# Patient Record
Sex: Female | Born: 1940 | Race: White | Hispanic: No | Marital: Married | Smoking: Never smoker
Health system: Southern US, Community
[De-identification: ages and names within clinical notes are randomized; demographics above are authoritative.]

## PROBLEM LIST (undated history)

## (undated) DIAGNOSIS — M797 Fibromyalgia: Secondary | ICD-10-CM

## (undated) DIAGNOSIS — G709 Myoneural disorder, unspecified: Secondary | ICD-10-CM

## (undated) DIAGNOSIS — R112 Nausea with vomiting, unspecified: Secondary | ICD-10-CM

## (undated) DIAGNOSIS — Z9889 Other specified postprocedural states: Secondary | ICD-10-CM

## (undated) DIAGNOSIS — K219 Gastro-esophageal reflux disease without esophagitis: Secondary | ICD-10-CM

## (undated) DIAGNOSIS — E559 Vitamin D deficiency, unspecified: Secondary | ICD-10-CM

## (undated) DIAGNOSIS — IMO0002 Reserved for concepts with insufficient information to code with codable children: Secondary | ICD-10-CM

## (undated) DIAGNOSIS — M1990 Unspecified osteoarthritis, unspecified site: Secondary | ICD-10-CM

## (undated) DIAGNOSIS — M069 Rheumatoid arthritis, unspecified: Secondary | ICD-10-CM

## (undated) DIAGNOSIS — R600 Localized edema: Secondary | ICD-10-CM

## (undated) DIAGNOSIS — Z9289 Personal history of other medical treatment: Secondary | ICD-10-CM

## (undated) DIAGNOSIS — G35 Multiple sclerosis: Secondary | ICD-10-CM

## (undated) DIAGNOSIS — D649 Anemia, unspecified: Secondary | ICD-10-CM

## (undated) DIAGNOSIS — I10 Essential (primary) hypertension: Secondary | ICD-10-CM

## (undated) DIAGNOSIS — E785 Hyperlipidemia, unspecified: Secondary | ICD-10-CM

## (undated) HISTORY — PX: OTHER SURGICAL HISTORY: SHX169

## (undated) HISTORY — PX: JOINT REPLACEMENT: SHX530

## (undated) HISTORY — PX: DILATION AND CURETTAGE OF UTERUS: SHX78

## (undated) HISTORY — PX: COLONOSCOPY: SHX174

## (undated) HISTORY — PX: THYROID LOBECTOMY: SHX420

## (undated) HISTORY — PX: BACK SURGERY: SHX140

## (undated) HISTORY — PX: WRIST SURGERY: SHX841

## (undated) HISTORY — PX: EYE SURGERY: SHX253

## (undated) HISTORY — PX: AUGMENTATION MAMMAPLASTY: SUR837

## (undated) HISTORY — DX: Rheumatoid arthritis, unspecified: M06.9

## (undated) HISTORY — DX: Multiple sclerosis: G35

## (undated) HISTORY — PX: KNEE ARTHROSCOPY: SUR90

## (undated) HISTORY — PX: BREAST SURGERY: SHX581

---

## 1997-10-17 ENCOUNTER — Other Ambulatory Visit: Admission: RE | Admit: 1997-10-17 | Discharge: 1997-10-17 | Payer: Self-pay | Admitting: Gynecology

## 1998-03-30 ENCOUNTER — Encounter: Payer: Self-pay | Admitting: Gynecology

## 1998-03-30 ENCOUNTER — Ambulatory Visit (HOSPITAL_COMMUNITY): Admission: RE | Admit: 1998-03-30 | Discharge: 1998-03-30 | Payer: Self-pay | Admitting: Gynecology

## 1998-04-13 ENCOUNTER — Ambulatory Visit (HOSPITAL_COMMUNITY): Admission: RE | Admit: 1998-04-13 | Discharge: 1998-04-13 | Payer: Self-pay | Admitting: General Surgery

## 1998-10-22 ENCOUNTER — Other Ambulatory Visit: Admission: RE | Admit: 1998-10-22 | Discharge: 1998-10-22 | Payer: Self-pay | Admitting: Gynecology

## 1999-05-01 ENCOUNTER — Encounter: Payer: Self-pay | Admitting: Gynecology

## 1999-05-01 ENCOUNTER — Ambulatory Visit (HOSPITAL_COMMUNITY): Admission: RE | Admit: 1999-05-01 | Discharge: 1999-05-01 | Payer: Self-pay | Admitting: Gynecology

## 1999-11-12 ENCOUNTER — Other Ambulatory Visit: Admission: RE | Admit: 1999-11-12 | Discharge: 1999-11-12 | Payer: Self-pay | Admitting: Gynecology

## 2000-05-06 ENCOUNTER — Encounter: Payer: Self-pay | Admitting: Gynecology

## 2000-05-06 ENCOUNTER — Ambulatory Visit (HOSPITAL_COMMUNITY): Admission: RE | Admit: 2000-05-06 | Discharge: 2000-05-06 | Payer: Self-pay | Admitting: Gynecology

## 2000-12-28 ENCOUNTER — Other Ambulatory Visit: Admission: RE | Admit: 2000-12-28 | Discharge: 2000-12-28 | Payer: Self-pay | Admitting: Gynecology

## 2001-05-26 ENCOUNTER — Encounter: Payer: Self-pay | Admitting: Gynecology

## 2001-05-26 ENCOUNTER — Ambulatory Visit (HOSPITAL_COMMUNITY): Admission: RE | Admit: 2001-05-26 | Discharge: 2001-05-26 | Payer: Self-pay | Admitting: Gynecology

## 2002-01-03 ENCOUNTER — Other Ambulatory Visit: Admission: RE | Admit: 2002-01-03 | Discharge: 2002-01-03 | Payer: Self-pay | Admitting: Gynecology

## 2002-06-01 ENCOUNTER — Ambulatory Visit (HOSPITAL_COMMUNITY): Admission: RE | Admit: 2002-06-01 | Discharge: 2002-06-01 | Payer: Self-pay | Admitting: Gynecology

## 2002-06-01 ENCOUNTER — Encounter: Payer: Self-pay | Admitting: Gynecology

## 2003-01-16 ENCOUNTER — Other Ambulatory Visit: Admission: RE | Admit: 2003-01-16 | Discharge: 2003-01-16 | Payer: Self-pay | Admitting: Gynecology

## 2003-06-27 ENCOUNTER — Ambulatory Visit (HOSPITAL_COMMUNITY): Admission: RE | Admit: 2003-06-27 | Discharge: 2003-06-27 | Payer: Self-pay | Admitting: Gynecology

## 2004-05-15 ENCOUNTER — Other Ambulatory Visit: Admission: RE | Admit: 2004-05-15 | Discharge: 2004-05-15 | Payer: Self-pay | Admitting: Family Medicine

## 2004-06-27 ENCOUNTER — Ambulatory Visit (HOSPITAL_COMMUNITY): Admission: RE | Admit: 2004-06-27 | Discharge: 2004-06-27 | Payer: Self-pay | Admitting: Family Medicine

## 2005-07-02 ENCOUNTER — Ambulatory Visit (HOSPITAL_COMMUNITY): Admission: RE | Admit: 2005-07-02 | Discharge: 2005-07-02 | Payer: Self-pay | Admitting: Family Medicine

## 2005-07-10 ENCOUNTER — Other Ambulatory Visit: Admission: RE | Admit: 2005-07-10 | Discharge: 2005-07-10 | Payer: Self-pay | Admitting: Family Medicine

## 2006-07-14 ENCOUNTER — Ambulatory Visit (HOSPITAL_COMMUNITY): Admission: RE | Admit: 2006-07-14 | Discharge: 2006-07-14 | Payer: Self-pay | Admitting: Family Medicine

## 2006-07-23 ENCOUNTER — Other Ambulatory Visit: Admission: RE | Admit: 2006-07-23 | Discharge: 2006-07-23 | Payer: Self-pay | Admitting: Family Medicine

## 2007-08-02 ENCOUNTER — Ambulatory Visit (HOSPITAL_COMMUNITY): Admission: RE | Admit: 2007-08-02 | Discharge: 2007-08-02 | Payer: Self-pay | Admitting: Family Medicine

## 2008-08-28 ENCOUNTER — Ambulatory Visit (HOSPITAL_COMMUNITY): Admission: RE | Admit: 2008-08-28 | Discharge: 2008-08-28 | Payer: Self-pay | Admitting: Family Medicine

## 2009-10-26 ENCOUNTER — Ambulatory Visit (HOSPITAL_COMMUNITY): Admission: RE | Admit: 2009-10-26 | Discharge: 2009-10-26 | Payer: Self-pay | Admitting: Family Medicine

## 2010-10-21 ENCOUNTER — Other Ambulatory Visit (HOSPITAL_COMMUNITY): Payer: Self-pay | Admitting: Family Medicine

## 2010-10-21 DIAGNOSIS — Z1231 Encounter for screening mammogram for malignant neoplasm of breast: Secondary | ICD-10-CM

## 2010-10-30 ENCOUNTER — Ambulatory Visit (HOSPITAL_COMMUNITY)
Admission: RE | Admit: 2010-10-30 | Discharge: 2010-10-30 | Disposition: A | Discharge status: Home / Self Care | Payer: Medicare Other | Source: Ambulatory Visit | Attending: Family Medicine | Admitting: Family Medicine

## 2010-10-30 DIAGNOSIS — Z1231 Encounter for screening mammogram for malignant neoplasm of breast: Secondary | ICD-10-CM

## 2011-12-15 ENCOUNTER — Other Ambulatory Visit (HOSPITAL_COMMUNITY): Payer: Self-pay | Admitting: Family Medicine

## 2011-12-15 DIAGNOSIS — Z1231 Encounter for screening mammogram for malignant neoplasm of breast: Secondary | ICD-10-CM

## 2011-12-15 DIAGNOSIS — Z139 Encounter for screening, unspecified: Secondary | ICD-10-CM

## 2011-12-26 ENCOUNTER — Other Ambulatory Visit (HOSPITAL_COMMUNITY): Payer: Self-pay | Admitting: Family Medicine

## 2011-12-26 ENCOUNTER — Ambulatory Visit (HOSPITAL_COMMUNITY)
Admission: RE | Admit: 2011-12-26 | Discharge: 2011-12-26 | Disposition: A | Discharge status: Home / Self Care | Payer: Medicare Other | Source: Ambulatory Visit | Attending: Family Medicine | Admitting: Family Medicine

## 2011-12-26 DIAGNOSIS — Z1231 Encounter for screening mammogram for malignant neoplasm of breast: Secondary | ICD-10-CM

## 2012-06-02 ENCOUNTER — Encounter (HOSPITAL_COMMUNITY): Payer: Self-pay | Admitting: Pharmacy Technician

## 2012-06-08 ENCOUNTER — Encounter (HOSPITAL_COMMUNITY): Payer: Self-pay

## 2012-06-08 ENCOUNTER — Encounter (HOSPITAL_COMMUNITY)
Admission: RE | Admit: 2012-06-08 | Discharge: 2012-06-08 | Disposition: A | Discharge status: Home / Self Care | Payer: Medicare Other | Source: Ambulatory Visit | Attending: Orthopedic Surgery | Admitting: Orthopedic Surgery

## 2012-06-08 HISTORY — DX: Unspecified osteoarthritis, unspecified site: M19.90

## 2012-06-08 HISTORY — DX: Anemia, unspecified: D64.9

## 2012-06-08 HISTORY — DX: Personal history of other medical treatment: Z92.89

## 2012-06-08 HISTORY — DX: Other specified postprocedural states: Z98.890

## 2012-06-08 HISTORY — DX: Gastro-esophageal reflux disease without esophagitis: K21.9

## 2012-06-08 HISTORY — DX: Vitamin D deficiency, unspecified: E55.9

## 2012-06-08 HISTORY — DX: Nausea with vomiting, unspecified: R11.2

## 2012-06-08 HISTORY — DX: Reserved for concepts with insufficient information to code with codable children: IMO0002

## 2012-06-08 HISTORY — DX: Hyperlipidemia, unspecified: E78.5

## 2012-06-08 HISTORY — DX: Myoneural disorder, unspecified: G70.9

## 2012-06-08 LAB — APTT: aPTT: 37 seconds (ref 24–37)

## 2012-06-08 LAB — URINALYSIS, ROUTINE W REFLEX MICROSCOPIC
Glucose, UA: NEGATIVE mg/dL
Hgb urine dipstick: NEGATIVE
Ketones, ur: NEGATIVE mg/dL
Leukocytes, UA: NEGATIVE
Nitrite: NEGATIVE
Protein, ur: NEGATIVE mg/dL
Specific Gravity, Urine: 1.028 (ref 1.005–1.030)
Urobilinogen, UA: 0.2 mg/dL (ref 0.0–1.0)
pH: 5.5 (ref 5.0–8.0)

## 2012-06-08 LAB — PROTIME-INR
INR: 1.03 (ref 0.00–1.49)
Prothrombin Time: 13.4 seconds (ref 11.6–15.2)

## 2012-06-08 LAB — SURGICAL PCR SCREEN
MRSA, PCR: NEGATIVE
Staphylococcus aureus: NEGATIVE

## 2012-06-08 LAB — ABO/RH: ABO/RH(D): O POS

## 2012-06-08 NOTE — Progress Notes (Signed)
OV Dr Hyacinth Meeker with clearance 3/14 chart. CBC with diff, CMP, chest x ray, EKG 05/18/12 CHART

## 2012-06-08 NOTE — Patient Instructions (Addendum)
20 [facility] STAY  06/08/2012   Your procedure is scheduled on:  06/15/12 TUESDAY  Report to [facility] at  [phone]     AM.  Call this number if you have problems the morning of surgery: [phone]       Remember:   Do not eat food  Or drink :After Midnight.MONDAY NIGHT   Take these medicines the morning of surgery with A SIP OF WATER:  PROLISEC   .  Contacts, dentures or partial plates can not be worn to surgery  Leave suitcase in the car. After surgery it may be brought to your room.  For patients admitted to the hospital, checkout time is 11:00 AM day of  discharge.             SPECIAL INSTRUCTIONS- SEE [facility] PREPARING FOR SURGERY INSTRUCTION SHEET-     DO NOT WEAR JEWELRY, LOTIONS, POWDERS, OR PERFUMES.  WOMEN-- DO NOT SHAVE LEGS OR UNDERARMS FOR 12 HOURS BEFORE SHOWERS. MEN MAY SHAVE FACE.  Patients discharged the day of surgery will not be allowed to drive home. IF going home the day of surgery, you must have a driver and someone to stay with you for the first 24 hours  Name and phone number of your driver:    admission                                                                   Please read over the following fact sheets that you were given: MRSA Information, Incentive Spirometry Sheet, Blood Transfusion Sheet  Information                                                                                   Michele Bryan  PST [phone]  [phone]                 FAILURE TO FOLLOW THESE INSTRUCTIONS MAY RESULT IN  CANCELLATION   OF YOUR SURGERY                                                  Patient Signature _____________________________

## 2012-06-09 NOTE — H&P (Signed)
TOTAL KNEE ADMISSION H&P  Patient is being admitted for right total knee arthroplasty.  Subjective:  Chief Complaint:right knee OA / pain.  HPI: Michele Bryan, 72 y.o. female, has a history of pain and functional disability in the right knee due to arthritis and has failed non-surgical conservative treatments for greater than 12 weeks to includeNSAID's and/or analgesics, use of assistive devices and activity modification.  Onset of symptoms was gradual, starting 10 years ago with gradually worsening course since that time. The patient noted no past surgery on the right knee(s).  Patient currently rates pain in the right knee(s) at 10 out of 10 with activity. Patient has worsening of pain with activity and weight bearing, pain that interferes with activities of daily living, pain with passive range of motion, crepitus and joint swelling.  Patient has evidence of periarticular osteophytes and joint space narrowing by imaging studies.  There is no active infection.  Risks, benefits and expectations were discussed with the patient. Patient understand the risks, benefits and expectations and wishes to proceed with surgery.   D/C Plans:   Home with HHPT  Post-op Meds:    Rx given for ASA, Zanaflex, Iron, Colace and MiraLax. Celebrex to be prescribed, but no written Rx needed, already has at home.  Tranexamic Acid:     To be given  Decadron:    To be given  FYI:    Would like to use CPM after surgery    Past Medical History  Diagnosis Date  . Hyperlipidemia   . Arthritis   . Neuromuscular disorder     multiple scleroosis/peripheral neuropathy  . PONV (postoperative nausea and vomiting)   . GERD (gastroesophageal reflux disease)   . History of blood transfusion   . Ulcer   . Vitamin D deficiency   . Anemia     Past Surgical History  Procedure Laterality Date  . Thyroid lobectomy    . Breast surgery      biopsy  . Knee arthroscopy Right   . Breast augumentation      No prescriptions  prior to admission   Allergies  Allergen Reactions  . Codeine Nausea And Vomiting  . Demerol (Meperidine) Nausea And Vomiting    History  Substance Use Topics  . Smoking status: Never Smoker   . Smokeless tobacco: Never Used  . Alcohol Use: No      Review of Systems  Constitutional: Negative.   HENT: Negative.   Eyes: Negative.   Respiratory: Negative.   Cardiovascular: Negative.   Gastrointestinal: Negative.   Genitourinary: Positive for urgency and frequency.  Musculoskeletal: Positive for joint pain.  Skin: Negative.   Neurological: Negative.   Endo/Heme/Allergies: Negative.   Psychiatric/Behavioral: Negative.     Objective:  Physical Exam  Constitutional: She is oriented to person, place, and time. She appears well-developed and well-nourished.  HENT:  Head: Normocephalic and atraumatic.  Mouth/Throat: Oropharynx is clear and moist.  Eyes: Pupils are equal, round, and reactive to light.  Neck: Neck supple. No JVD present. No tracheal deviation present. No thyromegaly present.  Cardiovascular: Normal rate, regular rhythm, normal heart sounds and intact distal pulses.   Respiratory: Effort normal and breath sounds normal. No stridor. No respiratory distress. She has no wheezes.  GI: Soft. There is no tenderness. There is no guarding.  Musculoskeletal:       Right knee: She exhibits decreased range of motion, swelling and bony tenderness. She exhibits no effusion, no ecchymosis, no deformity, no laceration and no  erythema. Tenderness found.  Lymphadenopathy:    She has no cervical adenopathy.  Neurological: She is alert and oriented to person, place, and time.  Skin: Skin is warm and dry.  Psychiatric: She has a normal mood and affect.     Imaging Review Plain radiographs demonstrate severe degenerative joint disease of the right knee(s). The overall alignment isneutral. The bone quality appears to be good for age and reported activity  level.  Assessment/Plan:  End stage arthritis, right knee   The patient history, physical examination, clinical judgment of the provider and imaging studies are consistent with end stage degenerative joint disease of the right knee(s) and total knee arthroplasty is deemed medically necessary. The treatment options including medical management, injection therapy arthroscopy and arthroplasty were discussed at length. The risks and benefits of total knee arthroplasty were presented and reviewed. The risks due to aseptic loosening, infection, stiffness, patella tracking problems, thromboembolic complications and other imponderables were discussed. The patient acknowledged the explanation, agreed to proceed with the plan and consent was signed. Patient is being admitted for inpatient treatment for surgery, pain control, PT, OT, prophylactic antibiotics, VTE prophylaxis, progressive ambulation and ADL's and discharge planning. The patient is planning to be discharged home with home health services.    Anastasio Auerbach Ferrah Panagopoulos   PAC  06/09/2012, 5:09 PM

## 2012-06-15 ENCOUNTER — Encounter (HOSPITAL_COMMUNITY): Payer: Self-pay | Admitting: *Deleted

## 2012-06-15 ENCOUNTER — Inpatient Hospital Stay (HOSPITAL_COMMUNITY)
Admission: RE | Admit: 2012-06-15 | Discharge: 2012-06-16 | DRG: 470 | Disposition: A | Discharge status: Home Health | Payer: Medicare Other | Source: Ambulatory Visit | Attending: Orthopedic Surgery | Admitting: Orthopedic Surgery

## 2012-06-15 ENCOUNTER — Encounter (HOSPITAL_COMMUNITY): Payer: Self-pay | Admitting: Anesthesiology

## 2012-06-15 ENCOUNTER — Inpatient Hospital Stay (HOSPITAL_COMMUNITY): Payer: Medicare Other | Admitting: Anesthesiology

## 2012-06-15 ENCOUNTER — Encounter (HOSPITAL_COMMUNITY)
Admission: RE | Disposition: A | Discharge status: Home Health | Payer: Self-pay | Source: Ambulatory Visit | Attending: Orthopedic Surgery

## 2012-06-15 DIAGNOSIS — G35 Multiple sclerosis: Secondary | ICD-10-CM | POA: Diagnosis present

## 2012-06-15 DIAGNOSIS — Z96659 Presence of unspecified artificial knee joint: Secondary | ICD-10-CM

## 2012-06-15 DIAGNOSIS — Z01812 Encounter for preprocedural laboratory examination: Secondary | ICD-10-CM

## 2012-06-15 DIAGNOSIS — D500 Iron deficiency anemia secondary to blood loss (chronic): Secondary | ICD-10-CM

## 2012-06-15 DIAGNOSIS — G609 Hereditary and idiopathic neuropathy, unspecified: Secondary | ICD-10-CM | POA: Diagnosis present

## 2012-06-15 DIAGNOSIS — Z96651 Presence of right artificial knee joint: Secondary | ICD-10-CM

## 2012-06-15 DIAGNOSIS — E785 Hyperlipidemia, unspecified: Secondary | ICD-10-CM | POA: Diagnosis present

## 2012-06-15 DIAGNOSIS — M1710 Unilateral primary osteoarthritis, unspecified knee: Principal | ICD-10-CM | POA: Diagnosis present

## 2012-06-15 DIAGNOSIS — D62 Acute posthemorrhagic anemia: Secondary | ICD-10-CM | POA: Diagnosis not present

## 2012-06-15 DIAGNOSIS — K219 Gastro-esophageal reflux disease without esophagitis: Secondary | ICD-10-CM | POA: Diagnosis present

## 2012-06-15 HISTORY — PX: TOTAL KNEE ARTHROPLASTY: SHX125

## 2012-06-15 LAB — TYPE AND SCREEN
ABO/RH(D): O POS
Antibody Screen: NEGATIVE

## 2012-06-15 SURGERY — ARTHROPLASTY, KNEE, TOTAL
Anesthesia: Spinal | Site: Knee | Laterality: Right | Wound class: Clean

## 2012-06-15 MED ORDER — ACETAMINOPHEN 10 MG/ML IV SOLN: 1000.0000 mg | Freq: Once | INTRAVENOUS | Status: DC | PRN

## 2012-06-15 MED ORDER — SCOPOLAMINE 1 MG/3DAYS TD PT72: MEDICATED_PATCH | TRANSDERMAL | Status: DC | PRN

## 2012-06-15 MED ORDER — DEXAMETHASONE SODIUM PHOSPHATE 10 MG/ML IJ SOLN
10.0000 mg | Freq: Once | INTRAMUSCULAR | Status: AC
  Administered 2012-06-15: 10 mg via INTRAVENOUS

## 2012-06-15 MED ORDER — ALUM & MAG HYDROXIDE-SIMETH 200-200-20 MG/5ML PO SUSP: 30.0000 mL | ORAL | Status: DC | PRN

## 2012-06-15 MED ORDER — BUPIVACAINE LIPOSOME 1.3 % IJ SUSP
INTRAMUSCULAR | Status: DC | PRN
  Administered 2012-06-15: 20 mL

## 2012-06-15 MED ORDER — CEFAZOLIN SODIUM-DEXTROSE 2-3 GM-% IV SOLR
2.0000 g | INTRAVENOUS | Status: AC
  Administered 2012-06-15: 2 g via INTRAVENOUS

## 2012-06-15 MED ORDER — METOCLOPRAMIDE HCL 5 MG/ML IJ SOLN: 5.0000 mg | Freq: Three times a day (TID) | INTRAMUSCULAR | Status: DC | PRN

## 2012-06-15 MED ORDER — DOCUSATE SODIUM 100 MG PO CAPS
100.0000 mg | ORAL_CAPSULE | Freq: Two times a day (BID) | ORAL | Status: DC
  Administered 2012-06-15 – 2012-06-16 (×2): 100 mg via ORAL

## 2012-06-15 MED ORDER — PROPOFOL 10 MG/ML IV EMUL
INTRAVENOUS | Status: DC | PRN
  Administered 2012-06-15: 75 ug/kg/min via INTRAVENOUS

## 2012-06-15 MED ORDER — FERROUS SULFATE 325 (65 FE) MG PO TABS
325.0000 mg | ORAL_TABLET | Freq: Three times a day (TID) | ORAL | Status: DC
  Administered 2012-06-15 – 2012-06-16 (×3): 325 mg via ORAL
  Filled 2012-06-15 (×5): qty 1

## 2012-06-15 MED ORDER — ZOLPIDEM TARTRATE 5 MG PO TABS
5.0000 mg | ORAL_TABLET | Freq: Every evening | ORAL | Status: DC | PRN
  Administered 2012-06-15: 5 mg via ORAL
  Filled 2012-06-15: qty 1

## 2012-06-15 MED ORDER — ONDANSETRON HCL 4 MG/2ML IJ SOLN: 4.0000 mg | Freq: Four times a day (QID) | INTRAMUSCULAR | Status: DC | PRN

## 2012-06-15 MED ORDER — HYDROMORPHONE HCL PF 1 MG/ML IJ SOLN
0.5000 mg | INTRAMUSCULAR | Status: DC | PRN
  Administered 2012-06-16: 1 mg via INTRAVENOUS
  Filled 2012-06-15: qty 1

## 2012-06-15 MED ORDER — RIVAROXABAN 10 MG PO TABS
10.0000 mg | ORAL_TABLET | ORAL | Status: DC
  Administered 2012-06-16: 10 mg via ORAL
  Filled 2012-06-15 (×2): qty 1

## 2012-06-15 MED ORDER — METHOCARBAMOL 500 MG PO TABS
500.0000 mg | ORAL_TABLET | Freq: Four times a day (QID) | ORAL | Status: DC | PRN
  Administered 2012-06-15 – 2012-06-16 (×2): 500 mg via ORAL
  Filled 2012-06-15 (×2): qty 1

## 2012-06-15 MED ORDER — MIDAZOLAM HCL 5 MG/5ML IJ SOLN
INTRAMUSCULAR | Status: DC | PRN
  Administered 2012-06-15 (×2): 1 mg via INTRAVENOUS

## 2012-06-15 MED ORDER — EPHEDRINE SULFATE 50 MG/ML IJ SOLN
INTRAMUSCULAR | Status: DC | PRN
  Administered 2012-06-15 (×3): 5 mg via INTRAVENOUS

## 2012-06-15 MED ORDER — MEPERIDINE HCL 50 MG/ML IJ SOLN: 6.2500 mg | INTRAMUSCULAR | Status: DC | PRN

## 2012-06-15 MED ORDER — CELECOXIB 200 MG PO CAPS
200.0000 mg | ORAL_CAPSULE | Freq: Two times a day (BID) | ORAL | Status: DC
  Administered 2012-06-15 – 2012-06-16 (×2): 200 mg via ORAL
  Filled 2012-06-15 (×3): qty 1

## 2012-06-15 MED ORDER — SODIUM CHLORIDE 0.9 % IJ SOLN
INTRAMUSCULAR | Status: DC | PRN
  Administered 2012-06-15: 50 mL

## 2012-06-15 MED ORDER — LACTATED RINGERS IV SOLN
INTRAVENOUS | Status: DC
  Administered 2012-06-15 (×2): via INTRAVENOUS

## 2012-06-15 MED ORDER — PANTOPRAZOLE SODIUM 40 MG PO TBEC
40.0000 mg | DELAYED_RELEASE_TABLET | Freq: Every day | ORAL | Status: DC
  Administered 2012-06-16: 40 mg via ORAL
  Filled 2012-06-15: qty 1

## 2012-06-15 MED ORDER — ACETAMINOPHEN 10 MG/ML IV SOLN
INTRAVENOUS | Status: DC | PRN
  Administered 2012-06-15: 1000 mg via INTRAVENOUS

## 2012-06-15 MED ORDER — SCOPOLAMINE 1 MG/3DAYS TD PT72
MEDICATED_PATCH | TRANSDERMAL | Status: DC | PRN
  Administered 2012-06-15: 1.5 mg via TRANSDERMAL

## 2012-06-15 MED ORDER — L-LYSINE 500 MG PO TABS: 1.0000 | ORAL_TABLET | Freq: Two times a day (BID) | ORAL | Status: DC

## 2012-06-15 MED ORDER — FLEET ENEMA 7-19 GM/118ML RE ENEM: 1.0000 | ENEMA | Freq: Once | RECTAL | Status: AC | PRN

## 2012-06-15 MED ORDER — PHENOL 1.4 % MT LIQD: 1.0000 | OROMUCOSAL | Status: DC | PRN

## 2012-06-15 MED ORDER — PROPOFOL 10 MG/ML IV EMUL
INTRAVENOUS | Status: DC | PRN
  Administered 2012-06-15: 150 mg via INTRAVENOUS

## 2012-06-15 MED ORDER — METHOCARBAMOL 100 MG/ML IJ SOLN: 500.0000 mg | Freq: Four times a day (QID) | INTRAVENOUS | Status: DC | PRN

## 2012-06-15 MED ORDER — BISACODYL 10 MG RE SUPP: 10.0000 mg | Freq: Every day | RECTAL | Status: DC | PRN

## 2012-06-15 MED ORDER — PROMETHAZINE HCL 25 MG/ML IJ SOLN: 6.2500 mg | INTRAMUSCULAR | Status: DC | PRN

## 2012-06-15 MED ORDER — METOCLOPRAMIDE HCL 10 MG PO TABS: 5.0000 mg | ORAL_TABLET | Freq: Three times a day (TID) | ORAL | Status: DC | PRN

## 2012-06-15 MED ORDER — CEFAZOLIN SODIUM-DEXTROSE 2-3 GM-% IV SOLR
2.0000 g | Freq: Four times a day (QID) | INTRAVENOUS | Status: AC
  Administered 2012-06-15 (×2): 2 g via INTRAVENOUS
  Filled 2012-06-15 (×2): qty 50

## 2012-06-15 MED ORDER — DEXAMETHASONE SODIUM PHOSPHATE 10 MG/ML IJ SOLN: 10.0000 mg | Freq: Once | INTRAMUSCULAR | Status: DC

## 2012-06-15 MED ORDER — HYDROCODONE-ACETAMINOPHEN 7.5-325 MG PO TABS
1.0000 | ORAL_TABLET | ORAL | Status: DC
  Administered 2012-06-15 – 2012-06-16 (×5): 1 via ORAL
  Filled 2012-06-15 (×5): qty 1

## 2012-06-15 MED ORDER — DIPHENHYDRAMINE HCL 25 MG PO CAPS: 25.0000 mg | ORAL_CAPSULE | Freq: Four times a day (QID) | ORAL | Status: DC | PRN

## 2012-06-15 MED ORDER — BUPIVACAINE LIPOSOME 1.3 % IJ SUSP
20.0000 mL | Freq: Once | INTRAMUSCULAR | Status: DC
  Filled 2012-06-15: qty 20

## 2012-06-15 MED ORDER — ONDANSETRON HCL 4 MG PO TABS: 4.0000 mg | ORAL_TABLET | Freq: Four times a day (QID) | ORAL | Status: DC | PRN

## 2012-06-15 MED ORDER — HYDROMORPHONE HCL PF 1 MG/ML IJ SOLN
0.2500 mg | INTRAMUSCULAR | Status: DC | PRN
  Administered 2012-06-15: 0.5 mg via INTRAVENOUS

## 2012-06-15 MED ORDER — MENTHOL 3 MG MT LOZG: 1.0000 | LOZENGE | OROMUCOSAL | Status: DC | PRN

## 2012-06-15 MED ORDER — SODIUM CHLORIDE 0.9 % IV SOLN
INTRAVENOUS | Status: DC
  Administered 2012-06-15 – 2012-06-16 (×2): via INTRAVENOUS
  Filled 2012-06-15 (×5): qty 1000

## 2012-06-15 MED ORDER — TRANEXAMIC ACID 100 MG/ML IV SOLN
1000.0000 mg | Freq: Once | INTRAVENOUS | Status: AC
  Administered 2012-06-15: 1000 mg via INTRAVENOUS
  Filled 2012-06-15: qty 10

## 2012-06-15 MED ORDER — ONDANSETRON HCL 4 MG/2ML IJ SOLN
INTRAMUSCULAR | Status: DC | PRN
  Administered 2012-06-15: 4 mg via INTRAVENOUS

## 2012-06-15 MED ORDER — STERILE WATER FOR IRRIGATION IR SOLN
Status: DC | PRN
  Administered 2012-06-15 (×2): 1500 mL

## 2012-06-15 MED ORDER — FENTANYL CITRATE 0.05 MG/ML IJ SOLN
INTRAMUSCULAR | Status: DC | PRN
  Administered 2012-06-15 (×2): 50 ug via INTRAVENOUS

## 2012-06-15 MED ORDER — POLYETHYLENE GLYCOL 3350 17 G PO PACK
17.0000 g | PACK | Freq: Two times a day (BID) | ORAL | Status: DC
  Administered 2012-06-15 (×2): 17 g via ORAL

## 2012-06-15 SURGICAL SUPPLY — 60 items
ADH SKN CLS APL DERMABOND .7 (GAUZE/BANDAGES/DRESSINGS) ×1
BAG SPEC THK2 15X12 ZIP CLS (MISCELLANEOUS) ×1
BAG ZIPLOCK 12X15 (MISCELLANEOUS) ×2 IMPLANT
BANDAGE ELASTIC 6 VELCRO ST LF (GAUZE/BANDAGES/DRESSINGS) ×2 IMPLANT
BANDAGE ESMARK 6X9 LF (GAUZE/BANDAGES/DRESSINGS) ×1 IMPLANT
BLADE SAW SGTL 13.0X1.19X90.0M (BLADE) ×2 IMPLANT
BNDG CMPR 9X6 STRL LF SNTH (GAUZE/BANDAGES/DRESSINGS) ×1
BNDG ESMARK 6X9 LF (GAUZE/BANDAGES/DRESSINGS) ×2
BOWL SMART MIX CTS (DISPOSABLE) ×2 IMPLANT
CEMENT HV SMART SET (Cement) ×2 IMPLANT
CLOTH BEACON ORANGE TIMEOUT ST (SAFETY) ×2 IMPLANT
CUFF TOURN SGL QUICK 34 (TOURNIQUET CUFF) ×2
CUFF TRNQT CYL 34X4X40X1 (TOURNIQUET CUFF) ×1 IMPLANT
DECANTER SPIKE VIAL GLASS SM (MISCELLANEOUS) ×2 IMPLANT
DERMABOND ADVANCED (GAUZE/BANDAGES/DRESSINGS) ×1
DERMABOND ADVANCED .7 DNX12 (GAUZE/BANDAGES/DRESSINGS) ×1 IMPLANT
DRAPE EXTREMITY T 121X128X90 (DRAPE) ×2 IMPLANT
DRAPE POUCH INSTRU U-SHP 10X18 (DRAPES) ×1 IMPLANT
DRAPE U-SHAPE 47X51 STRL (DRAPES) ×2 IMPLANT
DRSG AQUACEL AG ADV 3.5X10 (GAUZE/BANDAGES/DRESSINGS) ×2 IMPLANT
DRSG TEGADERM 4X4.75 (GAUZE/BANDAGES/DRESSINGS) ×2 IMPLANT
DURAPREP 26ML APPLICATOR (WOUND CARE) ×2 IMPLANT
ELECT REM PT RETURN 9FT ADLT (ELECTROSURGICAL) ×2
ELECTRODE REM PT RTRN 9FT ADLT (ELECTROSURGICAL) ×1 IMPLANT
EVACUATOR 1/8 PVC DRAIN (DRAIN) ×2 IMPLANT
FACESHIELD LNG OPTICON STERILE (SAFETY) ×10 IMPLANT
GAUZE SPONGE 2X2 8PLY STRL LF (GAUZE/BANDAGES/DRESSINGS) ×1 IMPLANT
GLOVE BIOGEL PI IND STRL 7.5 (GLOVE) ×1 IMPLANT
GLOVE BIOGEL PI IND STRL 8 (GLOVE) ×1 IMPLANT
GLOVE BIOGEL PI INDICATOR 7.5 (GLOVE) ×1
GLOVE BIOGEL PI INDICATOR 8 (GLOVE) ×1
GLOVE ECLIPSE 8.0 STRL XLNG CF (GLOVE) ×2 IMPLANT
GLOVE ORTHO TXT STRL SZ7.5 (GLOVE) ×4 IMPLANT
GOWN BRE IMP PREV XXLGXLNG (GOWN DISPOSABLE) ×2 IMPLANT
GOWN STRL NON-REIN LRG LVL3 (GOWN DISPOSABLE) ×2 IMPLANT
HANDPIECE INTERPULSE COAX TIP (DISPOSABLE) ×2
IMMOBILIZER KNEE 20 (SOFTGOODS)
IMMOBILIZER KNEE 20 THIGH 36 (SOFTGOODS) IMPLANT
KIT BASIN OR (CUSTOM PROCEDURE TRAY) ×2 IMPLANT
MANIFOLD NEPTUNE II (INSTRUMENTS) ×2 IMPLANT
NDL SAFETY ECLIPSE 18X1.5 (NEEDLE) ×1 IMPLANT
NEEDLE HYPO 18GX1.5 SHARP (NEEDLE) ×2
NS IRRIG 1000ML POUR BTL (IV SOLUTION) ×4 IMPLANT
PACK TOTAL JOINT (CUSTOM PROCEDURE TRAY) ×2 IMPLANT
POSITIONER SURGICAL ARM (MISCELLANEOUS) ×2 IMPLANT
SET HNDPC FAN SPRY TIP SCT (DISPOSABLE) ×1 IMPLANT
SET PAD KNEE POSITIONER (MISCELLANEOUS) ×2 IMPLANT
SPONGE GAUZE 2X2 STER 10/PKG (GAUZE/BANDAGES/DRESSINGS) ×1
SPONGE GAUZE 4X4 12PLY (GAUZE/BANDAGES/DRESSINGS) ×1 IMPLANT
SUCTION FRAZIER 12FR DISP (SUCTIONS) ×2 IMPLANT
SUT MNCRL AB 4-0 PS2 18 (SUTURE) ×2 IMPLANT
SUT VIC AB 1 CT1 36 (SUTURE) ×2 IMPLANT
SUT VIC AB 2-0 CT1 27 (SUTURE) ×6
SUT VIC AB 2-0 CT1 TAPERPNT 27 (SUTURE) ×3 IMPLANT
SUT VLOC 180 0 24IN GS25 (SUTURE) ×2 IMPLANT
SYR 50ML LL SCALE MARK (SYRINGE) ×2 IMPLANT
TOWEL OR 17X26 10 PK STRL BLUE (TOWEL DISPOSABLE) ×4 IMPLANT
TRAY FOLEY CATH 14FRSI W/METER (CATHETERS) ×2 IMPLANT
WATER STERILE IRR 1500ML POUR (IV SOLUTION) ×2 IMPLANT
WRAP KNEE MAXI GEL POST OP (GAUZE/BANDAGES/DRESSINGS) ×2 IMPLANT

## 2012-06-15 NOTE — Evaluation (Signed)
Physical Therapy Evaluation Patient Details Name: Michele Bryan MRN: [id number] DOB: [birth year] Today's Date: 06/15/2012 Time: 4098-1191 PT Time Calculation (min): 44 min  PT Assessment / Plan / Recommendation Clinical Impression  Pt with RTKA presents with limited pain however with decreased ROM and strength with dec ability with mobility. TO benefit from skilled PT to inc to mod Indepent level of mobility to return home.     PT Assessment  Patient needs continued PT services    Follow Up Recommendations  Home health PT    Does the patient have the potential to tolerate intense rehabilitation      Barriers to Discharge        Equipment Recommendations  None recommended by PT (already has equipment)    Recommendations for Other Services     Frequency 7X/week    Precautions / Restrictions Precautions Precautions: Knee Restrictions Weight Bearing Restrictions: No (WBAT)   Pertinent Vitals/Pain Very limited pain 2/10 reported during session. "not much, very tolerable"      Mobility  Bed Mobility Bed Mobility: Supine to Sit Supine to Sit: 4: Min assist;With rails Details for Bed Mobility Assistance: Pt just needed a little assistnce with RLE.  Transfers Transfers: Sit to Stand;Stand to Sit Sit to Stand: 4: Min assist;With upper extremity assist;From bed Stand to Sit: 4: Min assist;With upper extremity assist;To chair/3-in-1 Details for Transfer Assistance: cues for sequencing and safety Ambulation/Gait Ambulation/Gait Assistance: 4: Min guard Ambulation Distance (Feet): 15 Feet Assistive device: Rolling walker Ambulation/Gait Assistance Details: cues for wlker sequencing and safety Gait Pattern: Step-to pattern Gait velocity: slow    Exercises Total Joint Exercises Ankle Circles/Pumps: AROM;Both;10 reps;Supine Quad Sets: AROM;Right;10 reps;Supine Heel Slides: AAROM;Right;5 reps Straight Leg Raises: AAROM;Right;5 reps;Supine   PT Diagnosis: Difficulty  walking;Acute pain  PT Problem List: Decreased strength;Decreased range of motion;Decreased activity tolerance;Decreased balance;Decreased mobility;Decreased knowledge of use of DME;Decreased safety awareness PT Treatment Interventions: Gait training;Stair training;DME instruction;Functional mobility training;Therapeutic activities;Therapeutic exercise;Neuromuscular re-education;Patient/family education   PT Goals Acute Rehab PT Goals PT Goal Formulation: With patient Time For Goal Achievement: 06/22/12 Potential to Achieve Goals: Good Pt will go Sit to Supine/Side: with modified independence;with rail PT Goal: Sit to Supine/Side - Progress: Goal set today Pt will go Sit to Stand: with modified independence PT Goal: Sit to Stand - Progress: Goal set today Pt will go Stand to Sit: with modified independence;with upper extremity assist PT Goal: Stand to Sit - Progress: Goal set today Pt will Ambulate: 16 - 50 feet;with supervision;with standard walker PT Goal: Ambulate - Progress: Goal set today Pt will Go Up / Down Stairs: 1-2 stairs;with min assist;with rolling walker PT Goal: Up/Down Stairs - Progress: Goal set today Pt will Perform Home Exercise Program: with supervision, verbal cues required/provided PT Goal: Perform Home Exercise Program - Progress: Goal set today  Visit Information  Last PT Received On: 06/15/12 Assistance Needed: +1    Subjective Data  Subjective: I am feeling great and ready for therapy.  Patient Stated Goal: To return home.   Prior Functioning  Home Living Lives With: Spouse Available Help at Discharge: Family Type of Home: House Home Access: Stairs to enter [employer] of Steps: 1 Home Layout: Two level;Full bath on main level;Able to live on main level with bedroom/bathroom Home Adaptive Equipment: Walker - rolling;Walker - standard;Straight cane (borrowing for church) Prior Function Level of Independence: Independent Able to Take  Stairs?: Yes Driving: Yes Vocation: Retired Musician: No difficulties    Copywriter, advertising  Overall Cognitive Status: Appears within functional limits for tasks assessed/performed Arousal/Alertness: Awake/alert Orientation Level: Appears intact for tasks assessed Behavior During Session: [facility] for tasks performed    Extremity/Trunk Assessment Right Lower Extremity Assessment RLE ROM/Strength/Tone: Deficits;Due to pain RLE ROM/Strength/Tone Deficits: knee flexion supine to about 45 degrees with great tolerance and full extension.  RLE Sensation: WFL - Light Touch Left Lower Extremity Assessment LLE ROM/Strength/Tone: Within functional levels LLE Sensation: WFL - Light Touch   Balance    End of Session PT - End of Session Equipment Utilized During Treatment: Gait belt Activity Tolerance: Patient tolerated treatment well Patient left: in chair;with family/visitor present Nurse Communication: Mobility status CPM Right Knee CPM Right Knee: Off Right Knee Flexion (Degrees): 60 Right Knee Extension (Degrees): 0  GP     Michele Bryan 06/15/2012, 6:37 PM

## 2012-06-15 NOTE — Anesthesia Procedure Notes (Signed)
Spinal  Patient location during procedure: OR Start time: 06/15/2012 8:18 AM End time: 06/15/2012 8:23 AM Staffing Anesthesiologist: Lewie Loron R Performed by: anesthesiologist  Preanesthetic Checklist Completed: patient identified, site marked, surgical consent, pre-op evaluation, timeout performed, IV checked, risks and benefits discussed and monitors and equipment checked Spinal Block Patient position: sitting Prep: ChloraPrep Patient monitoring: heart rate, continuous pulse ox and blood pressure Approach: left paramedian Location: L2-3 Injection technique: single-shot Needle Needle type: Quincke  Needle gauge: 22 G Needle length: 9 cm Assessment Events: failed spinal Additional Notes Expiration date of kit checked and confirmed. Patient tolerated procedure well, though the placement was difficult. Good CSF flow with aspiration initially, but at end of injection, could not aspirate CSF. After >5 minutes, pt did not have complete blockade. Converted to general.

## 2012-06-15 NOTE — Progress Notes (Signed)
Utilization review completed.  

## 2012-06-15 NOTE — Anesthesia Preprocedure Evaluation (Addendum)
Anesthesia Evaluation  Patient identified by MRN, date of birth, ID band Patient awake    Reviewed: Allergy & Precautions, H&P , NPO status , Patient's Chart, lab work & pertinent test results  History of Anesthesia Complications (+) PONV  Airway Mallampati: I TM Distance: >3 FB Neck ROM: Full    Dental  (+) Dental Advisory Given and Teeth Intact   Pulmonary neg pulmonary ROS,  breath sounds clear to auscultation        Cardiovascular negative cardio ROS  Rhythm:Regular Rate:Normal     Neuro/Psych MS  Neuromuscular disease negative psych ROS   GI/Hepatic Neg liver ROS, GERD-  Medicated,  Endo/Other  negative endocrine ROS  Renal/GU negative Renal ROS     Musculoskeletal  (+) Arthritis -, Osteoarthritis,    Abdominal   Peds  Hematology negative hematology ROS (+)   Anesthesia Other Findings   Reproductive/Obstetrics negative OB ROS                          Anesthesia Physical Anesthesia Plan  ASA: II  Anesthesia Plan: Spinal   Post-op Pain Management:    Induction: Intravenous  Airway Management Planned:   Additional Equipment:   Intra-op Plan:   Post-operative Plan:   Informed Consent: I have reviewed the patients History and Physical, chart, labs and discussed the procedure including the risks, benefits and alternatives for the proposed anesthesia with the patient or authorized representative who has indicated his/her understanding and acceptance.   Dental advisory given  Plan Discussed with: CRNA  Anesthesia Plan Comments: (Had lengthy discussion about MS and surgery and the risk of exacerbation. Specifically we discussed neuraxial anesthesia and MS. I mentioned that there is no increased risk for MS exacerbation with neuraxial techniques, but she may still have an exacerbation just from the surgery. Pt accepts the risks and is willing to proceed with the current plan. )        Anesthesia Quick Evaluation

## 2012-06-15 NOTE — Progress Notes (Signed)
Received orders for rw and commode.  Spoke with patient's husband and he stated that they have both pieces of equipment at home. No DME needs at this time.

## 2012-06-15 NOTE — Transfer of Care (Signed)
Immediate Anesthesia Transfer of Care Note  Patient: Michele Bryan  Procedure(s) Performed: Procedure(s): RIGHT TOTAL KNEE ARTHROPLASTY (Right)  Patient Location: PACU  Anesthesia Type:General  Level of Consciousness: awake, alert  and oriented  Airway & Oxygen Therapy: Patient Spontanous Breathing and Patient connected to face mask oxygen  Post-op Assessment: Report given to PACU RN and Post -op Vital signs reviewed and stable  Post vital signs: Reviewed and stable  Complications: No apparent anesthesia complications

## 2012-06-15 NOTE — Anesthesia Postprocedure Evaluation (Signed)
Anesthesia Post Note  Patient: Michele Bryan  Procedure(s) Performed: Procedure(s) (LRB): RIGHT TOTAL KNEE ARTHROPLASTY (Right)  Anesthesia type: General  Patient location: PACU  Post pain: Pain level controlled  Post assessment: Post-op Vital signs reviewed  Last Vitals: BP 138/75  Pulse 60  Temp(Src) 36.5 C (Oral)  Resp 16  SpO2 100%  Post vital signs: Reviewed  Level of consciousness: sedated  Complications: No apparent anesthesia complications

## 2012-06-15 NOTE — Interval H&P Note (Signed)
History and Physical Interval Note:  06/15/2012 6:36 AM  Michele Bryan  has presented today for surgery, with the diagnosis of RIGHT KNEE OSTEOARTHRITIS  The various methods of treatment have been discussed with the patient and family. After consideration of risks, benefits and other options for treatment, the patient has consented to  Procedure(s): RIGHT TOTAL KNEE ARTHROPLASTY (Right) as a surgical intervention .  The patient's history has been reviewed, patient examined, no change in status, stable for surgery.  I have reviewed the patient's chart and labs.  Questions were answered to the patient's satisfaction.     [name]

## 2012-06-15 NOTE — Op Note (Signed)
NAME:  NATALIN BIBLE                      MEDICAL RECORD NO.:  [id number]                             FACILITY:  [facility]      PHYSICIAN:  Madlyn Frankel. Charlann Boxer, M.D.  DATE OF BIRTH:  02-Dec-1940      DATE OF PROCEDURE:  06/15/2012                                     OPERATIVE REPORT         PREOPERATIVE DIAGNOSIS:  Right knee osteoarthritis.      POSTOPERATIVE DIAGNOSIS:  Right knee osteoarthritis.      FINDINGS:  The patient was noted to have complete loss of cartilage and   bone-on-bone arthritis with associated osteophytes in the lateral and patellofemoral compartments of   the knee with a significant synovitis and associated effusion.      PROCEDURE:  Right total knee replacement.      COMPONENTS USED:  DePuy rotating platform posterior stabilized knee   system, a size 2.5 femur, 2.5 tibia, 10 mm PS insert, and 35 patellar   button.      SURGEON:  Madlyn Frankel. Charlann Boxer, M.D.      ASSISTANT:  Lanney Gins, PA-C.      ANESTHESIA:  Spinal.      SPECIMENS:  None.      COMPLICATION:  None.      DRAINS:  One Hemovac.  EBL: <100cc      TOURNIQUET TIME:   Total Tourniquet Time Documented: Thigh (Right) - 40 minutes Total: Thigh (Right) - 40 minutes  .      The patient was stable to the recovery room.      INDICATION FOR PROCEDURE:  JOHNNISHA FORTON is a 72 y.o. female patient of   mine.  The patient had been seen, evaluated, and treated conservatively in the   office with medication, activity modification, and injections.  The patient had   radiographic changes of bone-on-bone arthritis with endplate sclerosis and osteophytes noted.      The patient failed conservative measures including medication, injections, and activity modification, and at this point was ready for more definitive measures.   Based on the radiographic changes and failed conservative measures, the patient   decided to proceed with total knee replacement.  Risks of infection,   DVT, component failure, need for  revision surgery, postop course, and   expectations were all   discussed and reviewed.  Consent was obtained for benefit of pain   relief.      PROCEDURE IN DETAIL:  The patient was brought to the operative theater.   Once adequate anesthesia, preoperative antibiotics, 2 gm of Ancef administered, the patient was positioned supine with the right thigh tourniquet placed.  The  right lower extremity was prepped and draped in sterile fashion.  A time-   out was performed identifying the patient, planned procedure, and   extremity.      The right lower extremity was placed in the [facility] leg holder.  The leg was   exsanguinated, tourniquet elevated to 250 mmHg.  A midline incision was   made followed by median parapatellar arthrotomy.  Following initial   exposure, attention was  first directed to the patella.  Precut   measurement was noted to be 23 mm.  I resected down to 13-14 mm and used a   35 patellar button to restore patellar height as well as cover the cut   surface.      The lug holes were drilled and a metal shim was placed to protect the   patella from retractors and saw blades.      At this point, attention was now directed to the femur.  The femoral   canal was opened with a drill, irrigated to try to prevent fat emboli.  An   intramedullary rod was passed at 5 degrees valgus, 12 mm of bone was   resected off the distal femur.  Following this resection, the tibia was   subluxated anteriorly.  Using the extramedullary guide, 2 mm of bone was resected off   the proximal lateral tibia.  We confirmed the gap would be   stable medially and laterally with a 10 mm insert as well as confirmed   the cut was perpendicular in the coronal plane, checking with an alignment rod.      Once this was done, I sized the femur to be a size 2.5 in the anterior-   posterior dimension, chose a standard component based on medial and   lateral dimension.  The size 2.5 rotation block was then pinned in    position anterior referenced using the C-clamp to set rotation.  The   anterior, posterior, and  chamfer cuts were made without difficulty nor   notching making certain that I was along the anterior cortex to help   with flexion gap stability.      The final box cut was made off the lateral aspect of distal femur.      At this point, the tibia was sized to be a size 2.5, the size 2.5 tray was   then pinned in position through the medial third of the tubercle,   drilled, and keel punched.  Trial reduction was now carried with a 2.5 femur,  2.5 tibia, a 10 mm insert, and the 35 patella botton.  The knee was brought to   extension, full extension with good flexion stability with the patella   tracking through the trochlea without application of pressure.  Given   all these findings, the trial components removed.  Final components were   opened and cement was mixed.  The knee was irrigated with normal saline   solution and pulse lavage.  The synovial lining was   then injected with 0.25% Marcaine with epinephrine and 1 cc of Toradol,   total of 61 cc.      The knee was irrigated.  Final implants were then cemented onto clean and   dried cut surfaces of bone with the knee brought to extension with a 10 mm trial insert.      Once the cement had fully cured, the excess cement was removed   throughout the knee.  I confirmed I was satisfied with the range of   motion and stability, and the final 10 mm PS insert was chosen.  It was   placed into the knee.      The tourniquet had been let down at 35 minutes.  No significant   hemostasis required.  The medium Hemovac drain was placed deep.  The   extensor mechanism was then reapproximated using #1 Vicryl with the knee   in flexion.  The  remaining wound was closed with 2-0 Vicryl and running 4-0 Monocryl.   The knee was cleaned, dried, dressed sterilely using Dermabond and   Aquacel dressing.  Drain site dressed separately.  The patient was  then   brought to recovery room in stable condition, tolerating the procedure   well.   Please note that Physician Assistant, Lanney Gins, was present for the entirety of the case, and was utilized for pre-operative positioning, peri-operative retractor management, general facilitation of the procedure.  He was also utilized for primary wound closure at the end of the case.              Madlyn Frankel Charlann Boxer, M.D.

## 2012-06-16 ENCOUNTER — Encounter (HOSPITAL_COMMUNITY): Payer: Self-pay | Admitting: Orthopedic Surgery

## 2012-06-16 DIAGNOSIS — D500 Iron deficiency anemia secondary to blood loss (chronic): Secondary | ICD-10-CM

## 2012-06-16 LAB — CBC
HCT: 30.4 % — ABNORMAL LOW (ref 36.0–46.0)
Hemoglobin: 10 g/dL — ABNORMAL LOW (ref 12.0–15.0)
MCH: 28.5 pg (ref 26.0–34.0)
MCHC: 32.9 g/dL (ref 30.0–36.0)
MCV: 86.6 fL (ref 78.0–100.0)
Platelets: 177 10*3/uL (ref 150–400)
RBC: 3.51 MIL/uL — ABNORMAL LOW (ref 3.87–5.11)
RDW: 14.8 % (ref 11.5–15.5)
WBC: 10.6 10*3/uL — ABNORMAL HIGH (ref 4.0–10.5)

## 2012-06-16 LAB — BASIC METABOLIC PANEL
BUN: 12 mg/dL (ref 6–23)
CO2: 28 mEq/L (ref 19–32)
Calcium: 8.4 mg/dL (ref 8.4–10.5)
Chloride: 106 mEq/L (ref 96–112)
Creatinine, Ser: 0.68 mg/dL (ref 0.50–1.10)
GFR calc Af Amer: >90 mL/min (ref >90)
GFR calc non Af Amer: 87 mL/min — ABNORMAL LOW (ref >90)
Glucose, Bld: 116 mg/dL — ABNORMAL HIGH (ref 70–99)
Potassium: 4 mEq/L (ref 3.5–5.1)
Sodium: 139 mEq/L (ref 135–145)

## 2012-06-16 MED ORDER — HYDROCODONE-ACETAMINOPHEN 7.5-325 MG PO TABS: 1.0000 | ORAL_TABLET | ORAL | Status: DC | PRN

## 2012-06-16 MED ORDER — TIZANIDINE HCL 4 MG PO CAPS: 4.0000 mg | ORAL_CAPSULE | Freq: Three times a day (TID) | ORAL | Status: DC

## 2012-06-16 MED ORDER — ASPIRIN EC 325 MG PO TBEC: 325.0000 mg | DELAYED_RELEASE_TABLET | Freq: Two times a day (BID) | ORAL | Status: DC

## 2012-06-16 MED ORDER — POLYETHYLENE GLYCOL 3350 17 G PO PACK: 17.0000 g | PACK | Freq: Two times a day (BID) | ORAL | Status: DC

## 2012-06-16 MED ORDER — DSS 100 MG PO CAPS: 100.0000 mg | ORAL_CAPSULE | Freq: Two times a day (BID) | ORAL | Status: DC

## 2012-06-16 MED ORDER — FERROUS SULFATE 325 (65 FE) MG PO TABS: 325.0000 mg | ORAL_TABLET | Freq: Three times a day (TID) | ORAL | Status: DC

## 2012-06-16 NOTE — Progress Notes (Signed)
06/16/2012 Dory Peru RN CCM [phone] CPM set up by Genevieve Norlander for home use([employer]).

## 2012-06-16 NOTE — Evaluation (Signed)
Occupational Therapy Evaluation Patient Details Name: Michele Bryan MRN: [id number] DOB: 12/02/1940 Today's Date: 06/16/2012 Time: 9528-4132 OT Time Calculation (min): 30 min  OT Assessment / Plan / Recommendation Clinical Impression  This 72 year old female was admitted for R TKA.  All education was completed.  Pt does not need any further OT at this time.      OT Assessment  Patient does not need any further OT services    Follow Up Recommendations  No OT follow up    Barriers to Discharge      Equipment Recommendations  None recommended by OT    Recommendations for Other Services    Frequency       Precautions / Restrictions Precautions Precautions: Knee Restrictions Weight Bearing Restrictions: No   Pertinent Vitals/Pain Very little pain reported in RLE  Repositioned with ice and RN brought meds.      ADL  Grooming: Min guard Where Assessed - Grooming: Supported standing Upper Body Bathing: Set up Where Assessed - Upper Body Bathing: Unsupported sitting Lower Body Bathing: Minimal assistance Where Assessed - Lower Body Bathing: Supported sit to stand Upper Body Dressing: Minimal assistance Where Assessed - Upper Body Dressing: Supported standing Lower Body Dressing: Minimal assistance Where Assessed - Lower Body Dressing: Supported sit to Pharmacist, hospital: Hydrographic surveyor Method: Sit to Barista: Raised toilet seat with arms (or 3-in-1 over toilet) Toileting - Clothing Manipulation and Hygiene: Min guard Where Assessed - Toileting Clothing Manipulation and Hygiene: Sit to stand from 3-in-1 or toilet Equipment Used: Rolling walker Transfers/Ambulation Related to ADLs: reviewed shower sequence.  Pt did not wish to practice.  Husband has had both of his knees done. ADL Comments: reinforced safety.  Pt is very independent.  Educated to stretch one legs out for comfort with sit to stand and to keep one hand on walker.  Pt slightly  unsteady with adls but no LOB    OT Diagnosis:    OT Problem List:   OT Treatment Interventions:     OT Goals    Visit Information  Last OT Received On: 06/16/12 Assistance Needed: +1    Subjective Data  Subjective: I'd love to wash up a little Patient Stated Goal: home; get back to volunteering; hasn't been able to   Prior Functioning     Home Living Lives With: Spouse Bathroom Shower/Tub: Walk-in Stage manager: Standard Home Adaptive Equipment: Environmental consultant - rolling;Walker - standard;Straight cane Prior Function Level of Independence: Independent Able to Take Stairs?: Yes Driving: Yes Vocation: Retired Architect)         Vision/Perception     Copywriter, advertising Arousal/Alertness: Awake/alert Overall Cognitive Status: Within Functional Limits for tasks assessed    Extremity/Trunk Assessment Right Upper Extremity Assessment RUE ROM/Strength/Tone: [facility] for tasks assessed Left Upper Extremity Assessment LUE ROM/Strength/Tone: WFL for tasks assessed     Mobility Bed Mobility Supine to Sit: 4: Min assist;With rails Details for Bed Mobility Assistance: a little support with RLE Transfers Sit to Stand: 4: Min guard;From bed;From chair/3-in-1;With upper extremity assist     Exercise     Balance     End of Session OT - End of Session Activity Tolerance: Patient tolerated treatment well Patient left: in chair;with call bell/phone within reach CPM Right Knee CPM Right Knee: Off  GO     Michele Bryan 06/16/2012, 9:01 AM Marica Otter, OTR/L [phone] 06/16/2012

## 2012-06-16 NOTE — Progress Notes (Signed)
   Subjective: 1 Day Post-Op Procedure(s) (LRB): RIGHT TOTAL KNEE ARTHROPLASTY (Right)   Patient reports pain as mild, pain well controlled. No events throughout the night. Already doing really well with ROM. Ready to be discharged home.  Objective:   VITALS:   Filed Vitals:   06/16/12 0911  BP: 105/60  Pulse: 65  Temp: 98 F (36.7 C)  Resp: 16    Neurovascular intact Dorsiflexion/Plantar flexion intact Incision: dressing C/D/I No cellulitis present Compartment soft  LABS  Recent Labs  06/16/12 0428  HGB 10.0*  HCT 30.4*  WBC 10.6*  PLT 177     Recent Labs  06/16/12 0428  NA 139  K 4.0  BUN 12  CREATININE 0.68  GLUCOSE 116*     Assessment/Plan: 1 Day Post-Op Procedure(s) (LRB): RIGHT TOTAL KNEE ARTHROPLASTY (Right) HV drain d/c'ed Foley cath d/c'ed Advance diet Up with therapy D/C IV fluids Discharge home with home health Follow up in 2 weeks at [facility]. Follow up with OLIN,[facility] D in 2 weeks.  Contact information:  [facility] [address] [place] [address] [phone]    Expected ABLA  Treated with iron and will observe     Anastasio Auerbach. Martice Doty   PAC  06/16/2012, 9:31 AM

## 2012-06-16 NOTE — Progress Notes (Signed)
Physical Therapy Treatment Patient Details Name: Michele Bryan MRN: [id number] DOB: [birth year] Today's Date: 06/16/2012 Time: 7829-5621 PT Time Calculation (min): 37 min  PT Assessment / Plan / Recommendation Comments on Treatment Session  Pt. tolerated well, will have 1 more session, practice 1 step again.    Follow Up Recommendations  Home health PT     Does the patient have the potential to tolerate intense rehabilitation     Barriers to Discharge        Equipment Recommendations  None recommended by PT    Recommendations for Other Services    Frequency 7X/week   Plan Discharge plan remains appropriate    Precautions / Restrictions Precautions Precautions: Knee Restrictions Weight Bearing Restrictions: No   Pertinent Vitals/Pain 3, ice applied to r knee    Mobility  Bed Mobility Bed Mobility: Sit to Supine Supine to Sit: 5: Supervision Sit to Supine: 5: Supervision Details for Bed Mobility Assistance: instructed to use L foot to support R during in/out of bed. Transfers Sit to Stand: 4: Min guard;From bed;From chair/3-in-1;With armrests;With upper extremity assist Stand to Sit: To chair/3-in-1;To bed;5: Supervision Details for Transfer Assistance: cues for R leg position prior to sitting down. Ambulation/Gait Ambulation/Gait Assistance: 4: Min guard Ambulation Distance (Feet): 50 Feet Ambulation/Gait Assistance Details: cues for walker safety, sequence, roll walker Gait Pattern: Step-to pattern;Trunk flexed Gait velocity: slow Stairs: Yes Stairs Assistance: 4: Min assist Stairs Assistance Details (indicate cue type and reason): spouse present to assit pt. practiced x 3 Stair Management Technique: No rails;Backwards;With walker Number of Stairs: 1    Exercises Total Joint Exercises Ankle Circles/Pumps: AROM;Both;10 reps;Supine Quad Sets: AROM;Right;10 reps;Supine Short Arc Quad: AROM;Right;10 reps;Supine Heel Slides: Right;10 reps;AROM;Supine Straight Leg  Raises: AROM;10 reps;Supine Goniometric ROM: 90   PT Diagnosis:    PT Problem List:   PT Treatment Interventions:     PT Goals Acute Rehab PT Goals Pt will go Sit to Supine/Side: with modified independence;with rail PT Goal: Sit to Supine/Side - Progress: Progressing toward goal Pt will go Sit to Stand: with modified independence PT Goal: Sit to Stand - Progress: Progressing toward goal Pt will go Stand to Sit: with modified independence;with upper extremity assist PT Goal: Stand to Sit - Progress: Progressing toward goal Pt will Ambulate: 16 - 50 feet;with supervision;with standard walker PT Goal: Ambulate - Progress: Progressing toward goal Pt will Go Up / Down Stairs: 1-2 stairs;with min assist;with rolling walker PT Goal: Up/Down Stairs - Progress: Met Pt will Perform Home Exercise Program: with supervision, verbal cues required/provided PT Goal: Perform Home Exercise Program - Progress: Progressing toward goal  Visit Information  Last PT Received On: 06/16/12 Assistance Needed: +1    Subjective Data  Subjective: I am loopey, the meds.    Cognition  Cognition Arousal/Alertness: Awake/alert Overall Cognitive Status: Within Functional Limits for tasks assessed    Balance     End of Session PT - End of Session Activity Tolerance: Patient tolerated treatment well Patient left: in bed;with family/visitor present;with call bell/phone within reach Nurse Communication: Mobility status CPM Right Knee CPM Right Knee: Off   GP     Michele Bryan 06/16/2012, 11:26 AM

## 2012-06-16 NOTE — Progress Notes (Signed)
Physical Therapy Treatment Patient Details Name: Michele Bryan MRN: [id number] DOB: 29-Jun-1940 Today's Date: 06/16/2012 Time: 1335-1401 PT Time Calculation (min): 26 min  PT Assessment / Plan / Recommendation Comments on Treatment Session  pt. ambulated and practiced step again. encouraged safety with RW and to not overdo exercises. ready for DC.    Follow Up Recommendations  Home health PT     Does the patient have the potential to tolerate intense rehabilitation     Barriers to Discharge        Equipment Recommendations  None recommended by PT    Recommendations for Other Services    Frequency 7X/week   Plan Discharge plan remains appropriate    Precautions / Restrictions Precautions Precautions: Knee   Pertinent Vitals/Pain    Mobility  Bed Mobility Bed Mobility: Sit to Supine Supine to Sit: 6: Modified independent (Device/Increase time) Sit to Supine: 5: Supervision Details for Bed Mobility Assistance: pt used L foot to self assist. Transfers Sit to Stand: 5: Supervision;From bed;From chair/3-in-1;With upper extremity assist Stand to Sit: To bed;To chair/3-in-1;With upper extremity assist;With armrests Details for Transfer Assistance: cues to not step away from the RW. pt. got up and started to fo from the bed to recliner without RW. Ambulation/Gait Ambulation/Gait Assistance: 5: Supervision Ambulation Distance (Feet): 50 Feet Assistive device: Rolling walker Ambulation/Gait Assistance Details: cues for safety Gait Pattern: Step-to pattern;Trunk flexed Gait velocity: slow Stairs: Yes Stairs Assistance: 4: Min assist Stairs Assistance Details (indicate cue type and reason): practiced x 3, cues to put RW down before attempting to put R fot down. Stair Management Technique: No rails;Backwards;With walker Number of Stairs: 1    Exercises Total Joint Exercises Ankle Circles/Pumps: AROM;Both;10 reps;Supine Quad Sets: AROM;Right;10 reps;Supine Short Arc Quad:  AROM;Right;10 reps;Supine Heel Slides: Right;10 reps;AROM;Supine Straight Leg Raises: AROM;10 reps;Supine Goniometric ROM: 90   PT Diagnosis:    PT Problem List:   PT Treatment Interventions:     PT Goals Acute Rehab PT Goals Pt will go Sit to Supine/Side: with modified independence;with rail PT Goal: Sit to Supine/Side - Progress: Met Pt will go Sit to Stand: with modified independence PT Goal: Sit to Stand - Progress: Met Pt will go Stand to Sit: with modified independence;with upper extremity assist PT Goal: Stand to Sit - Progress: Met Pt will Ambulate: 16 - 50 feet;with supervision;with standard walker PT Goal: Ambulate - Progress: Met Pt will Go Up / Down Stairs: 1-2 stairs;with min assist;with rolling walker PT Goal: Up/Down Stairs - Progress: Met Pt will Perform Home Exercise Program: with supervision, verbal cues required/provided PT Goal: Perform Home Exercise Program - Progress: Progressing toward goal  Visit Information  Last PT Received On: 06/16/12 Assistance Needed: +1    Subjective Data  Subjective: I have done some exercises. i think I over did it.   Cognition  Cognition Arousal/Alertness: Awake/alert    Balance     End of Session PT - End of Session Activity Tolerance: Patient tolerated treatment well Patient left: in chair;with nursing in room Nurse Communication: Mobility status   GP     [name] 06/16/2012, 2:12 PM

## 2012-06-16 NOTE — Care Management Note (Signed)
    Page 1 of 1   06/16/2012     12:40:38 PM   CARE MANAGEMENT NOTE 06/16/2012  Patient:  Michele Bryan, Michele Bryan   Account Number:  [id number]  Date Initiated:  06/16/2012  Documentation initiated by:  Colleen Can  Subjective/Objective Assessment:   dx rt knee osteoarthritis; total knee replacemnt    Referral from doctor's office to [facility] for [facility] services which will start day after discharge.     Action/Plan:   CM spoke with patient. Plans are for patient to return to her home in [place] where spouse will be caregiver. She already has RW and BSC. Wants [facility] for [facility] services.   Anticipated DC Date:  06/16/2012   Anticipated DC Plan:  HOME W HOME HEALTH SERVICES      DC Planning Services  CM consult      [facility] Choice  HOME HEALTH   Choice offered to / List presented to:  C-1 Patient        HH arranged  HH-2 PT      [facility] agency  [facility]   Status of service:  Completed, signed off Medicare Important Message given?   (If response is "NO", the following Medicare IM given date fields will be blank) Date Medicare IM given:   Date Additional Medicare IM given:    Discharge Disposition:    Per UR Regulation:    If discussed at Long Length of Stay Meetings, dates discussed:    Comments:

## 2012-06-16 NOTE — Progress Notes (Signed)
Pt to d/c home with [facility]. Pt has all recommended DME. AVS reviewed and "My Chart" discussed with pt. Pt capable of verbalizing medications and follow-up appointments. Remains hemodynamically stable. No signs and symptoms of distress. Educated pt to return to ER in the case of SOB, dizziness, or chest pain.

## 2012-06-17 NOTE — Discharge Summary (Signed)
Physician Discharge Summary  Patient ID: Michele Bryan MRN: [id number] DOB/AGE: 1940-05-27 72 y.o.  Admit date: 06/15/2012 Discharge date: 06/16/2012   Procedures:  Procedure(s) (LRB): RIGHT TOTAL KNEE ARTHROPLASTY (Right)  Attending Physician:  Dr. Durene Romans   Admission Diagnoses:   Right knee OA / pain  Discharge Diagnoses:  Principal Problem:   S/P right TKA Active Problems:   Expected blood loss anemia  Diagnosis  . Hyperlipidemia  . Arthritis  . 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 PONV (postoperative nausea and vomiting)  . GERD (gastroesophageal reflux disease)  . History of blood transfusion  . Ulcer  . Vitamin D deficiency  . Anemia    HPI: Michele Bryan, 72 y.o. female, has a history of pain and functional disability in the right knee due to arthritis and has failed non-surgical conservative treatments for greater than 12 weeks to includeNSAID's and/or analgesics, use of assistive devices and activity modification. Onset of symptoms was gradual, starting 10 years ago with gradually worsening course since that time. The patient noted no past surgery on the right knee(s). Patient currently rates pain in the right knee(s) at 10 out of 10 with activity. Patient has worsening of pain with activity and weight bearing, pain that interferes with activities of daily living, pain with passive range of motion, crepitus and joint swelling. Patient has evidence of periarticular osteophytes and joint space narrowing by imaging studies. There is no active infection. Risks, benefits and expectations were discussed with the patient. Patient understand the risks, benefits and expectations and wishes to proceed with surgery.   PCP: Michele Labella, MD   Discharged Condition: good  Hospital Course:  Patient underwent the above stated procedure on 06/15/2012. Patient tolerated the procedure well and brought to the recovery room in good condition and  subsequently to the floor.  POD #1 BP: 105/60 ; Pulse: 65 ; Temp: 98 F (36.7 C) ; Resp: 16  Pt's foley was removed, as well as the hemovac drain removed. IV was changed to a saline lock. Patient reports pain as mild, pain well controlled. No events throughout the night. Already doing really well with ROM. Ready to be discharged home. Neurovascular intact, dorsiflexion/plantar flexion intact, incision: dressing C/D/I, no cellulitis present and compartment soft.   LABS  Basename  06/16/12    0428   HGB  10.0  HCT  30.4    Discharge Exam: General appearance: alert, cooperative and no distress Extremities: Homans sign is negative, no sign of DVT, no edema, redness or tenderness in the calves or thighs and no ulcers, gangrene or trophic changes  Disposition:   Home-Health Care Svc with follow up in 2 weeks   Follow-up Information   Follow up with Michele Pal, MD. Schedule an appointment as soon as possible for a visit in 2 weeks.   Contact information:   [address] [place] [address] [phone]       Discharge Orders   Future Orders Complete By Expires     Call MD / Call 911  As directed     Comments:      If you experience chest pain or shortness of breath, CALL 911 and be transported to the hospital emergency room.  If you develope a fever above 101 F, pus (white drainage) or increased drainage or redness at the wound, or calf pain, call your surgeon's office.    Change dressing  As directed     Comments:  Maintain surgical dressing for 10-14 days, then change the dressing daily with sterile 4 x 4 inch gauze dressing and tape. Keep the area dry and clean.    Constipation Prevention  As directed     Comments:      Drink plenty of fluids.  Prune juice may be helpful.  You may use a stool softener, such as Colace (over the counter) 100 mg twice a day.  Use MiraLax (over the counter) for constipation as needed.    Diet - low sodium heart healthy  As  directed     Discharge instructions  As directed     Comments:      Maintain surgical dressing for 10-14 days, then replace with gauze and tape. Keep the area dry and clean until follow up. Follow up in 2 weeks at [facility]. Call with any questions or concerns.    Driving restrictions  As directed     Comments:      No driving for 4 weeks    Increase activity slowly as tolerated  As directed     TED hose  As directed     Comments:      Use stockings (TED hose) for 2 weeks on both leg(s).  You may remove them at night for sleeping.    Weight bearing as tolerated  As directed          Medication List    TAKE these medications       aspirin EC 325 MG tablet  Take 1 tablet (325 mg total) by mouth 2 (two) times daily.     celecoxib 200 MG capsule  Commonly known as:  CELEBREX  Take 200 mg by mouth 2 (two) times daily as needed for pain.     cholecalciferol 1000 UNITS tablet  Commonly known as:  VITAMIN D  Take 1,000 Units by mouth daily.     DSS 100 MG Caps  Take 100 mg by mouth 2 (two) times daily.     ferrous sulfate 325 (65 FE) MG tablet  Take 1 tablet (325 mg total) by mouth 3 (three) times daily after meals.     fish oil-omega-3 fatty acids 1000 MG capsule  Take 1 g by mouth daily.     HYDROcodone-acetaminophen 7.5-325 MG per tablet  Commonly known as:  NORCO  Take 1-2 tablets by mouth every 4 (four) hours as needed for pain.     L-Lysine 500 MG Tabs  Take 1 tablet by mouth 2 (two) times daily.     multivitamin with minerals Tabs  Take 1 tablet by mouth daily.     omeprazole 40 MG capsule  Commonly known as:  PRILOSEC  Take 40 mg by mouth daily.     polyethylene glycol packet  Commonly known as:  MIRALAX / GLYCOLAX  Take 17 g by mouth 2 (two) times daily.     Selenium 200 MCG Tabs  Take 1 tablet by mouth daily.     tiZANidine 4 MG capsule  Commonly known as:  ZANAFLEX  Take 1 capsule (4 mg total) by mouth 3 (three) times daily. Muscle  spasms     vitamin E 400 UNIT capsule  Take 400 Units by mouth daily.     zinc gluconate 50 MG tablet  Take 50 mg by mouth daily.         Signed: Anastasio Auerbach. Azya Barbero   PAC  06/17/2012, 8:48 AM

## 2012-12-08 ENCOUNTER — Other Ambulatory Visit (HOSPITAL_COMMUNITY): Payer: Self-pay | Admitting: Family Medicine

## 2012-12-08 DIAGNOSIS — Z1231 Encounter for screening mammogram for malignant neoplasm of breast: Secondary | ICD-10-CM

## 2012-12-28 ENCOUNTER — Ambulatory Visit (HOSPITAL_COMMUNITY): Payer: Medicare Other

## 2013-01-06 ENCOUNTER — Ambulatory Visit (HOSPITAL_COMMUNITY)
Admission: RE | Admit: 2013-01-06 | Discharge: 2013-01-06 | Disposition: A | Discharge status: Home / Self Care | Payer: Medicare Other | Source: Ambulatory Visit | Attending: Family Medicine | Admitting: Family Medicine

## 2013-01-06 DIAGNOSIS — Z1231 Encounter for screening mammogram for malignant neoplasm of breast: Secondary | ICD-10-CM | POA: Insufficient documentation

## 2013-03-22 ENCOUNTER — Encounter: Payer: Self-pay | Admitting: Neurology

## 2013-03-22 ENCOUNTER — Encounter (INDEPENDENT_AMBULATORY_CARE_PROVIDER_SITE_OTHER): Payer: Self-pay

## 2013-03-22 ENCOUNTER — Ambulatory Visit (INDEPENDENT_AMBULATORY_CARE_PROVIDER_SITE_OTHER): Payer: Medicare Other | Admitting: Neurology

## 2013-03-22 VITALS — BP 122/80 | HR 62 | Ht 62.0 in | Wt 121.0 lb

## 2013-03-22 DIAGNOSIS — G35 Multiple sclerosis: Secondary | ICD-10-CM

## 2013-03-22 NOTE — Progress Notes (Signed)
History of Present Illness   Michele Bryan is a 73 years old right-handed Caucasian female, followup for relapsing remitting multiple sclerosis, she was previously patients of Dr. love, last clinical visit was in January 2014  She presented with numbness from neck down, double vision, blurry vision in 1971, gait difficulty, ambulate with a walker at its worst, her symptoms gradually improved with steroid treatment,  She was diagnosed with multiple sclerosis, but was never offered pending immunomodulation therapies,  Over years, she had a few flareups of acute onset vertigo, double vision, with worsening gait difficulty, yearly in recent 2 years, the most recent one was in December 2014, lasting for 2 week, improved by [employer].  Most recent MRI study of the brain per record was abnormal showing evidence of long T2 signal changes in the supraventricular and  periventricular region of the cerebral hemispheres,  MRI cervical spine 2001 showed a lesion at C2. C3  She has a long history of B12 deficiency, treated with B12 shots.   She has bladder incontinence and urinary tract infections as often as every 6 weeks and is followed by Dr. Jethro Bolus.  She is a retired Comptroller, still active, independent in her daily activity, driving without difficulty, exercise regularly, she has mild unsteady gait which has been fairly stable over the years   most recent Laboratory evaluation January 2015 showed normal CMP, CBC, and uric acid, rheumatoid factor was elevated 23, negative ANA, she was recently diagnosed with rheumatoid arthritis, she is now treated with plaquenil  Review of systems performed and notable only for light sensitivity, constipation, insomnia, frequent awakening, frequent urination, joint pain, joint swelling, bruise, dizziness, speech difficulty  ALLERGIES: Allergies  Allergen Reactions  . Codeine Nausea And Vomiting  . Demerol [Meperidine] Nausea And Vomiting    HOME  MEDICATIONS: Outpatient Prescriptions Prior to Visit  Medication Sig Dispense Refill  . aspirin EC 325 MG tablet Take 1 tablet (325 mg total) by mouth 2 (two) times daily.  60 tablet  0  . celecoxib (CELEBREX) 200 MG capsule Take 200 mg by mouth 2 (two) times daily as needed for pain.      . cholecalciferol (VITAMIN D) 1000 UNITS tablet Take 1,000 Units by mouth daily.      Marland Kitchen docusate sodium 100 MG CAPS Take 100 mg by mouth 2 (two) times daily.  10 capsule    . ferrous sulfate 325 (65 FE) MG tablet Take 1 tablet (325 mg total) by mouth 3 (three) times daily after meals.      . fish oil-omega-3 fatty acids 1000 MG capsule Take 1 g by mouth daily.      Marland Kitchen HYDROcodone-acetaminophen (NORCO) 7.5-325 MG per tablet Take 1-2 tablets by mouth every 4 (four) hours as needed for pain.  120 tablet  0  . L-Lysine 500 MG TABS Take 1 tablet by mouth 2 (two) times daily.      . Multiple Vitamin (MULTIVITAMIN WITH MINERALS) TABS Take 1 tablet by mouth daily.      Marland Kitchen omeprazole (PRILOSEC) 40 MG capsule Take 40 mg by mouth daily.      . polyethylene glycol (MIRALAX / GLYCOLAX) packet Take 17 g by mouth 2 (two) times daily.  14 each    . Selenium 200 MCG TABS Take 1 tablet by mouth daily.      Marland Kitchen tiZANidine (ZANAFLEX) 4 MG capsule Take 1 capsule (4 mg total) by mouth 3 (three) times daily. Muscle spasms  50 capsule  0  . vitamin  E 400 UNIT capsule Take 400 Units by mouth daily.      Marland Kitchen zinc gluconate 50 MG tablet Take 50 mg by mouth daily.       No facility-administered medications prior to visit.    PAST MEDICAL HISTORY: Past Medical History  Diagnosis Date  . Hyperlipidemia   . Arthritis   . Neuromuscular disorder     multiple scleroosis/peripheral neuropathy  . PONV (postoperative nausea and vomiting)   . GERD (gastroesophageal reflux disease)   . History of blood transfusion   . Ulcer   . Vitamin D deficiency   . Anemia   . MS (multiple sclerosis)     PAST SURGICAL HISTORY: Past Surgical History   Procedure Laterality Date  . Thyroid lobectomy    . Breast surgery      biopsy  . Knee arthroscopy Right   . Breast augumentation    . Total knee arthroplasty Right 06/15/2012    Procedure: RIGHT TOTAL KNEE ARTHROPLASTY;  Surgeon: [name], MD;  Location: [facility];  Service: Orthopedics;  Laterality: Right;    FAMILY HISTORY: Family History  Problem Relation Age of Onset  . Heart attack Father     SOCIAL HISTORY:  History   Social History  . Marital Status: Married    Spouse Name: [name]    Number of Children: 3  . Years of Education: college   Occupational History  .      retired   Social History Main Topics  . Smoking status: Never Smoker   . Smokeless tobacco: Never Used  . Alcohol Use: No  . Drug Use: No  . Sexual Activity: Not on file   Other Topics Concern  . Not on file   Social History Narrative   Patient lives at home with her husband [name]).   Retired.   Education- college.   Caffeine- Two cups of decaf tea daily.   Right handed.           PHYSICAL EXAM   Filed Vitals:   03/22/13 1435  Height: 5\' 2"  (1.575 m)  Weight: 121 lb (54.885 kg)    Not recorded    Body mass index is 22.13 kg/(m^2).   Generalized: In no acute distress  Neck: Supple, no carotid bruits   Cardiac: Regular rate rhythm  Pulmonary: Clear to auscultation bilaterally  Musculoskeletal: No deformity  Neurological examination  Mentation: Alert oriented to time, place, history taking, and causual conversation  Cranial nerve II-XII: Pupils were equal round reactive to light extraocular movements were full, Visual field were full on confrontational test. Bilateral fundi were sharp.  Facial sensation and strength were normal. Hearing was intact to finger rubbing bilaterally. Uvula tongue midline.  head turning and shoulder shrug and were normal and symmetric.Tongue protrusion into cheek strength was normal.  Motor: normal tone, bulk and strength.  Sensory: Intact  to fine touch, pinprick, preserved vibratory sensation, and proprioception at toes.  Coordination: Normal finger to nose, heel-to-shin bilaterally there was no truncal ataxia  Gait: Rising up from seated position without assistance, mildly wide-based, cautious gait, mild difficulty perform tiptoe, heel walking, moderate difficulty with tandem   Romberg signs: Negative  Deep tendon reflexes: Brachioradialis 2/2, biceps 2/2, triceps 2/2, patellar 2/2, Achilles 2/2, plantar responses were flexor bilaterally.   DIAGNOSTIC DATA (LABS, IMAGING, TESTING) - I reviewed patient records, labs, notes, testing and imaging myself where available.  Lab Results  Component Value Date   WBC 10.6* 06/16/2012   HGB 10.0*  06/16/2012   HCT 30.4* 06/16/2012   MCV 86.6 06/16/2012   PLT 177 06/16/2012      Component Value Date/Time   NA 139 06/16/2012 0428   K 4.0 06/16/2012 0428   CL 106 06/16/2012 0428   CO2 28 06/16/2012 0428   GLUCOSE 116* 06/16/2012 0428   BUN 12 06/16/2012 0428   CREATININE 0.68 06/16/2012 0428   CALCIUM 8.4 06/16/2012 0428   GFRNONAA 87* 06/16/2012 0428   GFRAA >90 06/16/2012 0428   ASSESSMENT AND PLAN    73 years old Caucasian female, with past medical history of relapsing remitting multiple sclerosis since 1971, based on her history, previous MRI findings,   she was never treated with immunomodulation therapy, clinical she is stable, I have discussed with her the possibility of repeating MRI of the brain, and MRI of cervical spine, she declined,   she is to continue moderate exercise, return to clinic in one year   Levert Feinstein, M.D. Ph.D.  [facility] [address] [place], [place] [address] [phone]

## 2013-03-23 DIAGNOSIS — G35 Multiple sclerosis: Secondary | ICD-10-CM | POA: Insufficient documentation

## 2013-12-07 ENCOUNTER — Other Ambulatory Visit (HOSPITAL_COMMUNITY): Payer: Self-pay | Admitting: Family Medicine

## 2013-12-07 DIAGNOSIS — Z1231 Encounter for screening mammogram for malignant neoplasm of breast: Secondary | ICD-10-CM

## 2013-12-16 ENCOUNTER — Other Ambulatory Visit: Payer: Self-pay

## 2014-01-11 ENCOUNTER — Ambulatory Visit (HOSPITAL_COMMUNITY): Payer: Medicare Other

## 2014-01-13 ENCOUNTER — Other Ambulatory Visit: Payer: Self-pay | Admitting: Family Medicine

## 2014-01-13 ENCOUNTER — Ambulatory Visit (HOSPITAL_COMMUNITY)
Admission: RE | Admit: 2014-01-13 | Discharge: 2014-01-13 | Disposition: A | Discharge status: Home / Self Care | Payer: Medicare Other | Source: Ambulatory Visit | Attending: Family Medicine | Admitting: Family Medicine

## 2014-01-13 DIAGNOSIS — Z1231 Encounter for screening mammogram for malignant neoplasm of breast: Secondary | ICD-10-CM | POA: Insufficient documentation

## 2014-01-13 DIAGNOSIS — R1032 Left lower quadrant pain: Secondary | ICD-10-CM

## 2014-01-19 ENCOUNTER — Ambulatory Visit
Admission: RE | Admit: 2014-01-19 | Discharge: 2014-01-19 | Disposition: A | Discharge status: Home / Self Care | Payer: 59 | Source: Ambulatory Visit | Attending: Family Medicine | Admitting: Family Medicine

## 2014-01-19 DIAGNOSIS — R1032 Left lower quadrant pain: Secondary | ICD-10-CM

## 2014-01-19 MED ORDER — IOHEXOL 300 MG/ML  SOLN
100.0000 mL | Freq: Once | INTRAMUSCULAR | Status: AC | PRN
  Administered 2014-01-19: 100 mL via INTRAVENOUS

## 2014-03-23 ENCOUNTER — Ambulatory Visit: Payer: Self-pay | Admitting: Neurology

## 2014-04-28 ENCOUNTER — Encounter: Payer: Self-pay | Admitting: Neurology

## 2014-04-28 ENCOUNTER — Ambulatory Visit (INDEPENDENT_AMBULATORY_CARE_PROVIDER_SITE_OTHER): Payer: Medicare Other | Admitting: Neurology

## 2014-04-28 VITALS — BP 148/77 | HR 69 | Ht 62.0 in | Wt 118.0 lb

## 2014-04-28 DIAGNOSIS — G35 Multiple sclerosis: Secondary | ICD-10-CM | POA: Diagnosis not present

## 2014-04-28 NOTE — Progress Notes (Signed)
History of Present Illness   Michele Bryan is a 74 years old right-handed Caucasian female, followup for relapsing remitting multiple sclerosis, she was previously patients of Dr. love, last clinical visit was in January 2014  She presented with numbness from neck down, double vision, blurry vision in 1971, gait difficulty, ambulate with a walker at its worst, her symptoms gradually improved with steroid treatment,  She was diagnosed with multiple sclerosis, but was never offered any immunomodulation therapies.  Over years, she had a few flareups of acute onset vertigo, double vision, with worsening gait difficulty, yearly in recent 2 years, the most recent one was in December 2014, lasting for 2 week, improved by [facility].  Most recent MRI study of the brain per record was abnormal showing evidence of long T2 signal changes in the supraventricular and  periventricular region of the cerebral hemispheres,  MRI cervical spine 2001 showed a lesion at C2. C3  She has a long history of B12 deficiency, treated with B12 shots in the past, on sublingual.   She has bladder incontinence and urinary tract infections as often as every 6 weeks and is followed by Dr. Carolan Clines.  She is a retired Licensed conveyancer, still active, independent in her daily activity, driving without difficulty, exercise regularly, she has mild unsteady gait which has been fairly stable over the years   most recent Laboratory evaluation January 2015 showed normal CMP, CBC, and uric acid, rheumatoid factor was elevated 23, negative ANA, she was recently diagnosed with rheumatoid arthritis, she is now treated with plaquenil  UPDATE Feb 26th 2016: She had GI bleeding in summer of 2015, she is no longer taking NSAIDs,   She still drives, ambulate without a cane, urinary urgency, wearing pads, practice TaiGi, Yoga regularly, ride stationary bike, likes to read,  She lives at home, rheumatoid arthritis, taking Plaquenil, vitamin B12  deficiency, by mouth B12 supplement,  Review of systems performed and notable only for incontinence of bladder, frequent urination, bruise easily, insomnia, joints pain, joint swelling, low back pain.  ALLERGIES: Allergies  Allergen Reactions  . Codeine Nausea And Vomiting  . Demerol [Meperidine] Nausea And Vomiting    HOME MEDICATIONS: Outpatient Prescriptions Prior to Visit  Medication Sig Dispense Refill  . aspirin EC 325 MG tablet Take 1 tablet (325 mg total) by mouth 2 (two) times daily. 60 tablet 0  . docusate sodium 100 MG CAPS Take 100 mg by mouth 2 (two) times daily. 10 capsule   . fish oil-omega-3 fatty acids 1000 MG capsule Take 1 g by mouth daily.    . hydroxychloroquine (PLAQUENIL) 200 MG tablet Take 200 mg by mouth daily.    Marland Kitchen L-Lysine 500 MG TABS Take 1 tablet by mouth 2 (two) times daily.    . Multiple Vitamin (MULTIVITAMIN WITH MINERALS) TABS Take 1 tablet by mouth daily.    . Selenium 200 MCG TABS Take 1 tablet by mouth daily.    . vitamin E 400 UNIT capsule Take 400 Units by mouth daily.    Marland Kitchen zinc gluconate 50 MG tablet Take 50 mg by mouth daily.    Marland Kitchen zolpidem (AMBIEN) 10 MG tablet Take 5 mg by mouth at bedtime as needed for sleep.     . celecoxib (CELEBREX) 200 MG capsule Take 200 mg by mouth 2 (two) times daily as needed for pain.    . cholecalciferol (VITAMIN D) 1000 UNITS tablet Take 1,000 Units by mouth daily.    Marland Kitchen dexlansoprazole (DEXILANT) 60 MG capsule Take 60  mg by mouth daily.    Marland Kitchen omeprazole (PRILOSEC) 40 MG capsule Take 40 mg by mouth daily.     No facility-administered medications prior to visit.    PAST MEDICAL HISTORY: Past Medical History  Diagnosis Date  . Hyperlipidemia   . Arthritis   . Neuromuscular disorder     multiple scleroosis/peripheral neuropathy  . PONV (postoperative nausea and vomiting)   . GERD (gastroesophageal reflux disease)   . History of blood transfusion   . Ulcer   . Vitamin D deficiency   . Anemia   . MS (multiple  sclerosis)   . Rheumatoid arthritis     PAST SURGICAL HISTORY: Past Surgical History  Procedure Laterality Date  . Thyroid lobectomy    . Breast surgery      biopsy  . Knee arthroscopy Right   . Breast augumentation    . 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 Heart attack Father     SOCIAL HISTORY:  History   Social History  . Marital Status: Married    Spouse Name: Michele Bryan  . Number of Children: 3  . Years of Education: college   Occupational History  .      retired   Social History Main Topics  . Smoking status: Never Smoker   . Smokeless tobacco: Never Used  . Alcohol Use: No  . Drug Use: No  . Sexual Activity: Not on file   Other Topics Concern  . Not on file   Social History Narrative   Patient lives at home with her husband Michele Bryan).   Retired.   Education- college.   Caffeine- Two cups of decaf tea daily.   Right handed.           PHYSICAL EXAM   Filed Vitals:   04/28/14 1025  BP: 148/77  Pulse: 69  Height: 5\' 2"  (1.575 m)  Weight: 118 lb (53.524 kg)    Not recorded      Body mass index is 21.58 kg/(m^2).  PHYSICAL EXAMNIATION:  Gen: NAD, conversant, well nourised, obese, well groomed                     Cardiovascular: Regular rate rhythm, no peripheral edema, warm, nontender. Eyes: Conjunctivae clear without exudates or hemorrhage Neck: Supple, no carotid bruise. Pulmonary: Clear to auscultation bilaterally   NEUROLOGICAL EXAM:  MENTAL STATUS: Speech:    Speech is normal; fluent and spontaneous with normal comprehension.  Cognition:    The patient is oriented to person, place, and time;     recent and remote memory intact;     language fluent;     normal attention, concentration,     fund of knowledge.  CRANIAL  NERVES: CN II: Visual fields are full to confrontation. Fundoscopic exam is normal with sharp discs and no vascular changes. Venous pulsations are present bilaterally. Pupils are 4 mm and briskly reactive to light. Visual acuity is 20/20 bilaterally. CN III, IV, VI: extraocular movement are normal. No ptosis. CN V: Facial sensation is intact to pinprick in all 3 divisions bilaterally. Corneal responses are intact.  CN VII: Face is symmetric with normal eye closure and smile. CN VIII: Hearing is normal to rubbing fingers CN IX, X: Palate elevates symmetrically. Phonation is normal. CN  XI: Head turning and shoulder shrug are intact CN XII: Tongue is midline with normal movements and no atrophy.  MOTOR: There is no pronator drift of out-stretched arms. Muscle bulk and tone are normal. Deformity of bilateral hand joints   Shoulder abduction Shoulder external rotation Elbow flexion Elbow extension Wrist flexion Wrist extension Finger abduction Hip flexion Knee flexion Knee extension Ankle dorsi flexion Ankle plantar flexion  R 5 5 5 5 5 5 5 5 5 5 5 5   L 5 5 5 5 5 5 5 5 5 5 5 5     REFLEXES: Reflexes are 2+ and symmetric at the biceps, triceps, knees, and ankles. Plantar responses are flexor.  SENSORY: Light touch, pinprick, position sense, and vibration sense are intact in fingers and toes.  COORDINATION: Rapid alternating movements and fine finger movements are intact. There is no dysmetria on finger-to-nose and heel-knee-shin. There are no abnormal or extraneous movements.   GAIT/STANCE: Posture is normal. Gait is steady with normal steps, base, arm swing, and turning. Mild scoliosis Romberg is absent.  DIAGNOSTIC DATA (LABS, IMAGING, TESTING) - I reviewed patient records, labs, notes, testing and imaging myself where available.  Lab Results  Component Value Date   WBC 10.6* 06/16/2012   HGB 10.0* 06/16/2012   HCT 30.4* 06/16/2012   MCV 86.6 06/16/2012   PLT 177 06/16/2012       Component Value Date/Time   NA 139 06/16/2012 0428   K 4.0 06/16/2012 0428   CL 106 06/16/2012 0428   CO2 28 06/16/2012 0428   GLUCOSE 116* 06/16/2012 0428   BUN 12 06/16/2012 0428   CREATININE 0.68 06/16/2012 0428   CALCIUM 8.4 06/16/2012 0428   GFRNONAA 87* 06/16/2012 0428   GFRAA >90 06/16/2012 0428   ASSESSMENT AND PLAN   74  years old Caucasian female, with past medical history of relapsing remitting multiple sclerosis since 1971, based on her history, previous MRI findings,   she was never treated with immunomodulation therapy,  she is highly function, clinically stable, urinary incontinence, continue monitoring,   she is to continue moderate exercise, return to clinic in one year   Marcial Pacas, M.D. Ph.D.  [facility] [address], [place], [place] [address] [phone]

## 2014-09-18 ENCOUNTER — Other Ambulatory Visit (HOSPITAL_COMMUNITY): Payer: Self-pay | Admitting: Respiratory Therapy

## 2014-09-18 DIAGNOSIS — R0689 Other abnormalities of breathing: Principal | ICD-10-CM

## 2014-09-18 DIAGNOSIS — R0600 Dyspnea, unspecified: Secondary | ICD-10-CM

## 2014-09-29 ENCOUNTER — Ambulatory Visit (HOSPITAL_COMMUNITY)
Admission: RE | Admit: 2014-09-29 | Discharge: 2014-09-29 | Disposition: A | Discharge status: Home / Self Care | Payer: Medicare Other | Source: Ambulatory Visit | Attending: Family Medicine | Admitting: Family Medicine

## 2014-09-29 DIAGNOSIS — R0600 Dyspnea, unspecified: Secondary | ICD-10-CM | POA: Diagnosis not present

## 2014-09-29 LAB — PULMONARY FUNCTION TEST
DL/VA % pred: 89 %
DL/VA: 4.06 ml/min/mmHg/L
DLCO unc % pred: 82 %
DLCO unc: 17.84 ml/min/mmHg
FEF 25-75 Post: 1.33 L/sec
FEF 25-75 Pre: 1.22 L/sec
FEF2575-%Change-Post: 8 %
FEF2575-%Pred-Post: 77 %
FEF2575-%Pred-Pre: 71 %
FEV1-%Change-Post: 5 %
FEV1-%Pred-Post: 108 %
FEV1-%Pred-Pre: 102 %
FEV1-Post: 2.21 L
FEV1-Pre: 2.09 L
FEV1FVC-%Change-Post: 6 %
FEV1FVC-%Pred-Pre: 90 %
FEV6-%Change-Post: -1 %
FEV6-%Pred-Post: 113 %
FEV6-%Pred-Pre: 115 %
FEV6-Post: 2.93 L
FEV6-Pre: 2.96 L
FEV6FVC-%Change-Post: 0 %
FEV6FVC-%Pred-Post: 102 %
FEV6FVC-%Pred-Pre: 102 %
FVC-%Change-Post: -1 %
FVC-%Pred-Post: 111 %
FVC-%Pred-Pre: 112 %
FVC-Post: 3.01 L
FVC-Pre: 3.05 L
Post FEV1/FVC ratio: 73 %
Post FEV6/FVC ratio: 97 %
Pre FEV1/FVC ratio: 69 %
Pre FEV6/FVC Ratio: 97 %
RV % pred: 159 %
RV: 3.39 L
TLC % pred: 144 %
TLC: 6.86 L

## 2014-09-29 MED ORDER — ALBUTEROL SULFATE (2.5 MG/3ML) 0.083% IN NEBU
2.5000 mg | INHALATION_SOLUTION | Freq: Once | RESPIRATORY_TRACT | Status: AC
  Administered 2014-09-29: 2.5 mg via RESPIRATORY_TRACT

## 2014-12-21 ENCOUNTER — Other Ambulatory Visit: Payer: Self-pay

## 2014-12-21 DIAGNOSIS — Z1231 Encounter for screening mammogram for malignant neoplasm of breast: Secondary | ICD-10-CM

## 2014-12-21 DIAGNOSIS — Z9882 Breast implant status: Secondary | ICD-10-CM

## 2015-01-16 ENCOUNTER — Ambulatory Visit
Admission: RE | Admit: 2015-01-16 | Discharge: 2015-01-16 | Disposition: A | Discharge status: Home / Self Care | Payer: Medicare Other | Source: Ambulatory Visit

## 2015-01-16 DIAGNOSIS — Z1231 Encounter for screening mammogram for malignant neoplasm of breast: Secondary | ICD-10-CM

## 2015-01-16 DIAGNOSIS — Z9882 Breast implant status: Secondary | ICD-10-CM

## 2015-04-30 ENCOUNTER — Ambulatory Visit (INDEPENDENT_AMBULATORY_CARE_PROVIDER_SITE_OTHER): Payer: Medicare Other | Admitting: Neurology

## 2015-04-30 ENCOUNTER — Encounter: Payer: Self-pay | Admitting: Neurology

## 2015-04-30 VITALS — BP 138/75 | HR 71 | Ht 62.0 in | Wt 117.0 lb

## 2015-04-30 DIAGNOSIS — G35 Multiple sclerosis: Secondary | ICD-10-CM | POA: Diagnosis not present

## 2015-04-30 DIAGNOSIS — K5900 Constipation, unspecified: Secondary | ICD-10-CM

## 2015-04-30 DIAGNOSIS — R3915 Urgency of urination: Secondary | ICD-10-CM | POA: Diagnosis not present

## 2015-04-30 DIAGNOSIS — K5909 Other constipation: Secondary | ICD-10-CM

## 2015-04-30 NOTE — Progress Notes (Signed)
Chief Complaint  Patient presents with  . Multiple Sclerosis    Feels her MS is stable.  No new concerns.     History of Present Illness   Michele Bryan is a 75 years old right-handed Caucasian female, followup for relapsing remitting multiple sclerosis, she was previously patients of Dr. love, last clinical visit was in Feb 2016.  She presented with numbness from neck down, double vision, blurry vision in 1971, gait difficulty, ambulate with a walker at its worst, her symptoms gradually improved with steroid treatment,  She was diagnosed with multiple sclerosis, but was never offered any immunomodulation therapies.  Over years, she had a few flareups of acute onset vertigo, double vision, with worsening gait difficulty,   the most recent one was in December 2014, lasting for 2 week, improved by [employer].  Previous: MRI study of the brain per record was abnormal showing evidence of long T2 signal changes in the supraventricular and  periventricular region of the cerebral hemispheres,  MRI cervical spine 2001 showed a lesion at C2. C3  She has a long history of B12 deficiency, treated with B12 shots in the past, on sublingual.   She has bladder incontinence and urinary tract infections as often as every 6 weeks and is followed by Dr. Jethro Bolus.  She is a retired Comptroller, still active, independent in her daily activity, driving without difficulty, exercise regularly, she has mild unsteady gait which has been fairly stable over the years  Laboratory evaluation January 2015 showed normal CMP, CBC, and uric acid, rheumatoid factor was elevated 23, negative ANA, she was recently diagnosed with rheumatoid arthritis, she is now treated with plaquenil  UPDATE Feb 26th 2016: She had GI bleeding in summer of 2015, she is no longer taking NSAIDs,   She still drives, ambulate without a cane, urinary urgency, wearing pads, practice TaiGi, Yoga regularly, ride stationary bike, likes to read,  She  lives at home, rheumatoid arthritis, taking Plaquenil, vitamin B12 deficiency, by mouth B12 supplement,  UPDATE Apr 30 2015: Last visit was in Feb 2016,  She still drives, no gait difficulty, she did trip and fall in Jan 2017, with left wrist fracture, has to have left wrist surgery, followed by OT,  She has mild urinary urgency, mild constipation, mild blurry vision, she has no memory loss, enjoys reading   Review of systems performed and notable only for incontinence of bladder, frequent urination, bruise easily, insomnia, joints pain, joint swelling, low back pain.  ALLERGIES: Allergies  Allergen Reactions  . Codeine Nausea And Vomiting  . Demerol [Meperidine] Nausea And Vomiting    HOME MEDICATIONS: Outpatient Prescriptions Prior to Visit  Medication Sig Dispense Refill  . fish oil-omega-3 fatty acids 1000 MG capsule Take 1 g by mouth daily.    . hydroxychloroquine (PLAQUENIL) 200 MG tablet Take 200 mg by mouth daily.    . IRON, FERROUS GLUCONATE, PO Take 65 mg by mouth daily.    Marland Kitchen KRILL OIL PO Take 500 mg by mouth daily.    Marland Kitchen L-Lysine 500 MG TABS Take 1 tablet by mouth 2 (two) times daily.    . Methenamine-Sodium Salicylate (CYSTEX PO) Take by mouth.    . Misc Natural Products (CYSTEX) LIQD Take 15 mLs by mouth.    . Multiple Vitamin (MULTIVITAMIN WITH MINERALS) TABS Take 1 tablet by mouth daily.    . vitamin B-12 (CYANOCOBALAMIN) 500 MCG tablet Take 500 mcg by mouth daily.    . vitamin E 400 UNIT capsule Take  400 Units by mouth daily.    Marland Kitchen zolpidem (AMBIEN) 10 MG tablet Take 5 mg by mouth at bedtime as needed for sleep.     Marland Kitchen aspirin EC 325 MG tablet Take 1 tablet (325 mg total) by mouth 2 (two) times daily. 60 tablet 0  . Cholecalciferol (VITAMIN D-3 PO) Take 3,000 Units by mouth daily.    Marland Kitchen docusate sodium 100 MG CAPS Take 100 mg by mouth 2 (two) times daily. 10 capsule   . Selenium 200 MCG TABS Take 1 tablet by mouth daily.    Marland Kitchen zinc gluconate 50 MG tablet Take 50 mg by mouth  daily.     No facility-administered medications prior to visit.    PAST MEDICAL HISTORY: Past Medical History  Diagnosis Date  . Hyperlipidemia   . Arthritis   . Neuromuscular disorder (HCC)     multiple scleroosis/peripheral neuropathy  . PONV (postoperative nausea and vomiting)   . GERD (gastroesophageal reflux disease)   . History of blood transfusion   . Ulcer   . Vitamin D deficiency   . Anemia   . MS (multiple sclerosis) (HCC)   . Rheumatoid arthritis (HCC)     PAST SURGICAL HISTORY: Past Surgical History  Procedure Laterality Date  . Thyroid lobectomy    . Breast surgery      biopsy  . Knee arthroscopy Right   . Breast augumentation    . Total knee arthroplasty Right 06/15/2012    Procedure: RIGHT TOTAL KNEE ARTHROPLASTY;  Surgeon: [name], MD;  Location: [facility];  Service: Orthopedics;  Laterality: Right;  . Wrist surgery Left     FAMILY HISTORY: Family History  Problem Relation Age of Onset  . Heart attack Father     SOCIAL HISTORY:  Social History   Social History  . Marital Status: Married    Spouse Name: [name]  . Number of Children: 3  . Years of Education: college   Occupational History  .      retired   Social History Main Topics  . Smoking status: Never Smoker   . Smokeless tobacco: Never Used  . Alcohol Use: No  . Drug Use: No  . Sexual Activity: Not on file   Other Topics Concern  . Not on file   Social History Narrative   Patient lives at home with her husband [name]).   Retired.   Education- college.   Caffeine- Two cups of decaf tea daily.   Right handed.           PHYSICAL EXAM   Filed Vitals:   04/30/15 1021  BP: 138/75  Pulse: 71  Height: 5\' 2"  (1.575 m)  Weight: 117 lb (53.071 kg)    Not recorded      Body mass index is 21.39 kg/(m^2).  PHYSICAL EXAMNIATION:  Gen: NAD, conversant, well nourised, obese, well groomed                     Cardiovascular: Regular rate rhythm, no peripheral edema, warm,  nontender. Eyes: Conjunctivae clear without exudates or hemorrhage Neck: Supple, no carotid bruise. Pulmonary: Clear to auscultation bilaterally   NEUROLOGICAL EXAM:  MENTAL STATUS: Speech:    Speech is normal; fluent and spontaneous with normal comprehension.  Cognition:    The patient is oriented to person, place, and time;     recent and remote memory intact;     language fluent;     normal attention, concentration,  fund of knowledge.  CRANIAL NERVES: CN II: Visual fields are full to confrontation. Fundoscopic exam is normal with sharp discs and no vascular changes. Venous pulsations are present bilaterally. Pupils are 4 mm and briskly reactive to light. Visual acuity is 20/20 bilaterally. CN III, IV, VI: extraocular movement are normal. No ptosis. CN V: Facial sensation is intact to pinprick in all 3 divisions bilaterally. Corneal responses are intact.  CN VII: Face is symmetric with normal eye closure and smile. CN VIII: Hearing is normal to rubbing fingers CN IX, X: Palate elevates symmetrically. Phonation is normal. CN XI: Head turning and shoulder shrug are intact CN XII: Tongue is midline with normal movements and no atrophy.  MOTOR: There is no pronator drift of out-stretched arms. Muscle bulk and tone are normal. Deformity of bilateral hand joints  REFLEXES: Reflexes are 3 and symmetric at the biceps, triceps, knees, and ankles. Plantar responses are flexor.  SENSORY: Light touch, pinprick, position sense, and vibration sense are intact in fingers and toes.  COORDINATION: Rapid alternating movements and fine finger movements are intact. There is no dysmetria on finger-to-nose and heel-knee-shin.   GAIT/STANCE: Mildly cautious, wide-based, good arm swing, and turning. Mild scoliosis Romberg is absent.  DIAGNOSTIC DATA (LABS, IMAGING, TESTING) - I reviewed patient records, labs, notes, testing and imaging myself where available.  Lab Results  Component  Value Date   WBC 10.6* 06/16/2012   HGB 10.0* 06/16/2012   HCT 30.4* 06/16/2012   MCV 86.6 06/16/2012   PLT 177 06/16/2012      Component Value Date/Time   NA 139 06/16/2012 0428   K 4.0 06/16/2012 0428   CL 106 06/16/2012 0428   CO2 28 06/16/2012 0428   GLUCOSE 116* 06/16/2012 0428   BUN 12 06/16/2012 0428   CREATININE 0.68 06/16/2012 0428   CALCIUM 8.4 06/16/2012 0428   GFRNONAA 87* 06/16/2012 0428   GFRAA >90 06/16/2012 0428   ASSESSMENT AND PLAN   75  years old Caucasian female  Relapsing remitting multiple sclerosis  Based on previous history, MRI scans, last MRI was 2001,  Clinically stable  Urinary urgency, chronic constipation,  I have suggested Metamucil powder supplement  Mild gait difficulty  Multifactorial, this is due to her aging, scoliosis, previous cervical relation,    Levert Feinstein, M.D. Ph.D.  [facility] [address], [address] [place], [place] [address] [phone]

## 2016-02-21 ENCOUNTER — Other Ambulatory Visit: Payer: Self-pay | Admitting: Rheumatology

## 2016-02-21 NOTE — Telephone Encounter (Signed)
Last Visit: 11/08/15 Next Visit due February 2018 Labs: 10/22/15 WNL PLQ Eye Exam: 05/17/15  Okay to refill PLQ?

## 2016-02-21 NOTE — Telephone Encounter (Signed)
ok 

## 2016-04-18 ENCOUNTER — Telehealth (INDEPENDENT_AMBULATORY_CARE_PROVIDER_SITE_OTHER): Payer: Self-pay | Admitting: Rheumatology

## 2016-04-18 MED ORDER — HYDROXYCHLOROQUINE SULFATE 200 MG PO TABS: ORAL_TABLET | ORAL | 0 refills | Status: DC

## 2016-04-18 NOTE — Telephone Encounter (Signed)
Rob (patient's son) calling regarding his mom and her Rx.  She was given Rx Plaquenil 2x per day (am-pm) but it bothered her stomach, so she was given omeprazole and took the Plaquenil only once per day.  She is now able to resume the dosage of am and pm, but is short on the Plaquenil and needs a refill.  Please call in @ [facility] on [address]

## 2016-04-18 NOTE — Telephone Encounter (Signed)
On visit with me, patient was having GI upset.As a result we went to 200 mg daily.Since patient is not having any more GI upsets and she wants to increase her Plaquenil to twice a day Monday through Friday only, we'll be happy to increase it.Please tell patient to get her Plaquenil eye exam updated in March 2018. Please have them send [place] the report as soon as possible.If patient has GI upset once again, she should go back down to 1 Plaquenil daily (with food) and to call our office and let us know.

## 2016-04-18 NOTE — Telephone Encounter (Signed)
Attempted to contact the patient and number has been disconnected . Prescription sent to the pharmacy.

## 2016-04-22 ENCOUNTER — Other Ambulatory Visit: Payer: Self-pay | Admitting: *Deleted

## 2016-04-22 ENCOUNTER — Other Ambulatory Visit: Payer: Self-pay | Admitting: Rheumatology

## 2016-04-22 MED ORDER — HYDROXYCHLOROQUINE SULFATE 200 MG PO TABS: ORAL_TABLET | ORAL | 0 refills | Status: DC

## 2016-04-22 NOTE — Progress Notes (Signed)
Patient request prescription to be sent to [facility]. Original prescription was sent to [facility]. Resent prescription to [facility].

## 2016-04-22 NOTE — Telephone Encounter (Signed)
Prescription sent to [facility]. Unable to notify patient as number provided in th chart is disconnected.

## 2016-04-22 NOTE — Telephone Encounter (Signed)
Patient called and stated that [facility] @ [address] has never received her prescription for PLQ.  She is needing a new prescription in order to go back twice a day.  Cb#[phone].

## 2016-04-29 ENCOUNTER — Ambulatory Visit: Payer: Medicare Other | Admitting: Neurology

## 2016-04-29 NOTE — Progress Notes (Signed)
Office Visit Note  Patient: Michele Bryan             Date of Birth: [birth year]           MRN: [id number]             PCP: Neldon Labella, MD Referring: Sigmund Hazel, MD Visit Date: 05/08/2016 Occupation: @GUAROCC @    Subjective:  Pain hands.   History of Present Illness: Michele Bryan is a 76 y.o. female with history of rheumatoid arthritis and osteoarthritis. According to patient she's not having much discomfort in her joints currently. She's been taking Plaquenil 200 mg twice a day. She has some discomfort in her right fourth distal phalanx. Knee joint pain is tolerable.  Activities of Daily Living:  Patient reports morning stiffness for 15 minutes.   Patient Reports nocturnal pain.  Difficulty dressing/grooming: Denies Difficulty climbing stairs: Reports Difficulty getting out of chair: Reports Difficulty using hands for taps, buttons, cutlery, and/or writing: Reports   Review of Systems  Constitutional: Negative for fatigue, night sweats, weight gain, weight loss and weakness.  HENT: Negative for mouth sores, trouble swallowing, trouble swallowing, mouth dryness and nose dryness.   Eyes: Negative for pain, redness, visual disturbance and dryness.  Respiratory: Negative for cough, shortness of breath and difficulty breathing.   Cardiovascular: Negative for chest pain, palpitations, hypertension, irregular heartbeat and swelling in legs/feet.  Gastrointestinal: Negative for blood in stool, constipation and diarrhea.  Endocrine: Negative for increased urination.  Genitourinary: Negative for vaginal dryness.  Musculoskeletal: Positive for arthralgias, joint pain, joint swelling and morning stiffness. Negative for myalgias, muscle weakness, muscle tenderness and myalgias.  Skin: Negative for color change, rash, hair loss, skin tightness, ulcers and sensitivity to sunlight.  Allergic/Immunologic: Negative for susceptible to infections.  Neurological: Negative for dizziness,  memory loss and night sweats.  Hematological: Negative for swollen glands.  Psychiatric/Behavioral: Negative for depressed mood and sleep disturbance. The patient is not nervous/anxious.     PMFS History:  Patient Active Problem List   Diagnosis Date Noted  . Rheumatoid arthritis involving multiple sites with positive rheumatoid factor (HCC) 05/06/2016  . High risk medication use 05/06/2016  . Primary osteoarthritis of both hands 05/06/2016  . Primary osteoarthritis of left knee 05/06/2016  . Primary osteoarthritis of both feet 05/06/2016  . Dyslipidemia 05/06/2016  . Peripheral neuropathy (HCC) 05/06/2016  . Diverticulosis of intestine without bleeding 05/06/2016  . Osteopenia  05/06/2016  . Vitamin D deficiency 05/06/2016  . 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 History of GI bleed 05/06/2016  . Cataract of both eyes 05/06/2016  . Urinary urgency 04/30/2015  . Chronic constipation 04/30/2015  . Multiple sclerosis (HCC) 03/23/2013  . Expected blood loss anemia 06/16/2012  . 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 Anemia   . Arthritis   . GERD (gastroesophageal reflux disease)   . History of blood transfusion   . Hyperlipidemia   . MS (multiple sclerosis) (HCC)   . Neuromuscular disorder (HCC)    multiple scleroosis/peripheral neuropathy  . PONV (postoperative nausea and vomiting)   . Rheumatoid arthritis (HCC)   . Ulcer (HCC)   . Vitamin D deficiency     Family History  Problem Relation Age of Onset  . Heart attack Father    Past Surgical History:  Procedure Laterality Date  . breast augumentation    . BREAST SURGERY     biopsy  . KNEE ARTHROSCOPY Right   .  THYROID LOBECTOMY    . TOTAL KNEE ARTHROPLASTY Right 06/15/2012   Procedure: RIGHT TOTAL KNEE ARTHROPLASTY;  Surgeon: [name], MD;  Location: [facility];  Service: Orthopedics;  Laterality: Right;  . WRIST SURGERY Left    Social History   Social History  Narrative   Patient lives at home with her husband [name]).   Retired.   Education- college.   Caffeine- Two cups of decaf tea daily.   Right handed.           Objective: Vital Signs: BP 130/82   Pulse 78   Resp 14   Ht 5' 5.5" (1.664 m)   Wt 122 lb (55.3 kg)   BMI 19.99 kg/m    Physical Exam  Constitutional: She is oriented to person, place, and time. She appears well-developed and well-nourished.  HENT:  Head: Normocephalic and atraumatic.  Eyes: Conjunctivae and EOM are normal.  Neck: Normal range of motion.  Cardiovascular: Normal rate, regular rhythm, normal heart sounds and intact distal pulses.   Pulmonary/Chest: Effort normal and breath sounds normal.  Abdominal: Soft. Bowel sounds are normal.  Lymphadenopathy:    She has no cervical adenopathy.  Neurological: She is alert and oriented to person, place, and time.  Skin: Skin is warm and dry. Capillary refill takes less than 2 seconds.  Psychiatric: She has a normal mood and affect. Her behavior is normal.  Nursing note and vitals reviewed.    Musculoskeletal Exam: C-spine limited range of motion. Lumbar spine good range of motion. She has some limitation with range of motion of her left shoulder. Elbow joints wrist joints are good range of motion. She has bilateral CMC PIP/DIP thickening with subluxation of several of her PIP/DIP joints. She is MCP joint thickening with no synovitis over her MCP joints now. Hip joints knee joints ankles MTPs PIPs with good range of motion. Her right total knee replacement is doing well.  CDAI Exam: CDAI Homunculus Exam:   Joint Counts:  CDAI Tender Joint count: 0 CDAI Swollen Joint count: 0  Global Assessments:  Patient Global Assessment: 4 Provider Global Assessment: 4  CDAI Calculated Score: 8    Investigation: Findings:  Labs 03/2013 show CBC, comprehensive metabolic panel, hepatitis panel, G6PD, uric acid, ACE level, and ANA were normal.  Rheumatoid factor was  23.  03/2013 .  Per EULAR recommendations, ultrasound examination of bilateral hands was performed using a 12 MHz transducer, gray scale, and power Doppler.  Bilateral hands were evaluated.  We looked at bilateral 2nd, 3rd, and 5th MCP joints, bilateral 2nd and 3rd PIP joints, and bilateral wrist joints, both dorsal and volar aspects.  The findings were:  She had synovitis in bilateral 2nd and 3rd MCP joints.  There was an effusion noted in the 2nd and 3rd PIP joints in the right hand and effusion in the left 2nd PIP joint.  She had synovitis in bilateral wrist joints on both the radial and ulnar sides.  There was also calcification noted along the ulnar styloid which could be consistent with chondrocalcinosis.  Bilateral median nerves were 0.13 cm square, which is more than upper limits of normal, but she had no clinical symptoms of carpal tunnel syndrome.  These findings were consistent with inflammatory arthritis, most likely rheumatoid arthritis with synovitis in bilateral hands, and also effusions were noted.  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  Plaquenil eye exam normal 05/17/15  11/12/2015 SPEP and TB gold negative         Imaging:  No results found.  Speciality Comments: No specialty comments available.    Procedures:  No procedures performed Allergies: Codeine and Demerol [meperidine]   Assessment / Plan:     Visit Diagnoses: Rheumatoid arthritis involving multiple sites with positive rheumatoid factor (HCC) - +RF -CCP . She is clinically doing better with no synovitis on examination now. She is tolerating Plaquenil well. She is some arthralgias due to osteoarthritis. I will refill her Plaquenil and Voltaren gel today  High risk medication use - Plaquenil  200 mg by mouth twice a day .. plaquenil eye exam normal 05/17/15. Patient will check on the status for repeat eye exam. Plan: CBC with Differential/Platelet, COMPLETE METABOLIC PANEL WITH GFR today  Primary osteoarthritis  of both hands: She is severe osteoarthritis which causes discomfort and stiffness  Primary osteoarthritis of left knee: She is some discomfort  Status post right knee replacement: Doing well  Primary osteoarthritis of both feet: Proper fitting shoes were discussed.  Her other medical problems are listed as follows:  Multiple sclerosis (HCC)  Dyslipidemia  Other polyneuropathy (HCC)  Diverticulosis  Osteopenia   Vitamin D deficiency  Postural kyphosis of thoracic region  History of GI bleed - On NSAIDs August 2015  Cataract of both eyes    Orders: Orders Placed This Encounter  Procedures  . CBC with Differential/Platelet  . COMPLETE METABOLIC PANEL WITH GFR   Meds ordered this encounter  Medications  . hydroxychloroquine (PLAQUENIL) 200 MG tablet    Sig: Take 1 tablet by mouth twice daily Monday-Friday.    Dispense:  120 tablet    Refill:  0  . diclofenac sodium (VOLTAREN) 1 % GEL    Sig: Apply 2 g topically 4 (four) times daily.    Dispense:  3 Tube    Refill:  1    Face-to-face time spent with patient was 30 minutes. 50% of time was spent in counseling and coordination of care.  Follow-Up Instructions: Return in about 5 months (around 10/08/2016) for Rheumatoid arthritis, Osteoarthritis.   Pollyann Savoy, MD  Note - This record has been created using Animal nutritionist.  Chart creation errors have been sought, but may not always  have been located. Such creation errors do not reflect on  the standard of medical care.

## 2016-05-06 DIAGNOSIS — G629 Polyneuropathy, unspecified: Secondary | ICD-10-CM | POA: Insufficient documentation

## 2016-05-06 DIAGNOSIS — M0579 Rheumatoid arthritis with rheumatoid factor of multiple sites without organ or systems involvement: Secondary | ICD-10-CM | POA: Insufficient documentation

## 2016-05-06 DIAGNOSIS — Z8719 Personal history of other diseases of the digestive system: Secondary | ICD-10-CM | POA: Insufficient documentation

## 2016-05-06 DIAGNOSIS — M19079 Primary osteoarthritis, unspecified ankle and foot: Secondary | ICD-10-CM | POA: Insufficient documentation

## 2016-05-06 DIAGNOSIS — M19042 Primary osteoarthritis, left hand: Secondary | ICD-10-CM

## 2016-05-06 DIAGNOSIS — M19049 Primary osteoarthritis, unspecified hand: Secondary | ICD-10-CM | POA: Insufficient documentation

## 2016-05-06 DIAGNOSIS — E785 Hyperlipidemia, unspecified: Secondary | ICD-10-CM | POA: Insufficient documentation

## 2016-05-06 DIAGNOSIS — E559 Vitamin D deficiency, unspecified: Secondary | ICD-10-CM | POA: Insufficient documentation

## 2016-05-06 DIAGNOSIS — M19072 Primary osteoarthritis, left ankle and foot: Secondary | ICD-10-CM

## 2016-05-06 DIAGNOSIS — M4004 Postural kyphosis, thoracic region: Secondary | ICD-10-CM | POA: Insufficient documentation

## 2016-05-06 DIAGNOSIS — M19041 Primary osteoarthritis, right hand: Secondary | ICD-10-CM | POA: Insufficient documentation

## 2016-05-06 DIAGNOSIS — M19071 Primary osteoarthritis, right ankle and foot: Secondary | ICD-10-CM | POA: Insufficient documentation

## 2016-05-06 DIAGNOSIS — H269 Unspecified cataract: Secondary | ICD-10-CM | POA: Insufficient documentation

## 2016-05-06 DIAGNOSIS — M1712 Unilateral primary osteoarthritis, left knee: Secondary | ICD-10-CM | POA: Insufficient documentation

## 2016-05-06 DIAGNOSIS — K5790 Diverticulosis of intestine, part unspecified, without perforation or abscess without bleeding: Secondary | ICD-10-CM | POA: Insufficient documentation

## 2016-05-06 DIAGNOSIS — M8589 Other specified disorders of bone density and structure, multiple sites: Secondary | ICD-10-CM | POA: Insufficient documentation

## 2016-05-06 DIAGNOSIS — Z79899 Other long term (current) drug therapy: Secondary | ICD-10-CM | POA: Insufficient documentation

## 2016-05-08 ENCOUNTER — Encounter: Payer: Self-pay | Admitting: Rheumatology

## 2016-05-08 ENCOUNTER — Ambulatory Visit (INDEPENDENT_AMBULATORY_CARE_PROVIDER_SITE_OTHER): Payer: Medicare Other | Admitting: Rheumatology

## 2016-05-08 VITALS — BP 130/82 | HR 78 | Resp 14 | Ht 65.5 in | Wt 122.0 lb

## 2016-05-08 DIAGNOSIS — E559 Vitamin D deficiency, unspecified: Secondary | ICD-10-CM

## 2016-05-08 DIAGNOSIS — M19071 Primary osteoarthritis, right ankle and foot: Secondary | ICD-10-CM | POA: Diagnosis not present

## 2016-05-08 DIAGNOSIS — Z79899 Other long term (current) drug therapy: Secondary | ICD-10-CM | POA: Diagnosis not present

## 2016-05-08 DIAGNOSIS — M19041 Primary osteoarthritis, right hand: Secondary | ICD-10-CM | POA: Diagnosis not present

## 2016-05-08 DIAGNOSIS — M0579 Rheumatoid arthritis with rheumatoid factor of multiple sites without organ or systems involvement: Secondary | ICD-10-CM

## 2016-05-08 DIAGNOSIS — G6289 Other specified polyneuropathies: Secondary | ICD-10-CM | POA: Diagnosis not present

## 2016-05-08 DIAGNOSIS — E785 Hyperlipidemia, unspecified: Secondary | ICD-10-CM

## 2016-05-08 DIAGNOSIS — K5790 Diverticulosis of intestine, part unspecified, without perforation or abscess without bleeding: Secondary | ICD-10-CM

## 2016-05-08 DIAGNOSIS — Z96651 Presence of right artificial knee joint: Secondary | ICD-10-CM | POA: Diagnosis not present

## 2016-05-08 DIAGNOSIS — M4004 Postural kyphosis, thoracic region: Secondary | ICD-10-CM

## 2016-05-08 DIAGNOSIS — M8589 Other specified disorders of bone density and structure, multiple sites: Secondary | ICD-10-CM

## 2016-05-08 DIAGNOSIS — M1712 Unilateral primary osteoarthritis, left knee: Secondary | ICD-10-CM | POA: Diagnosis not present

## 2016-05-08 DIAGNOSIS — H269 Unspecified cataract: Secondary | ICD-10-CM

## 2016-05-08 DIAGNOSIS — G35 Multiple sclerosis: Secondary | ICD-10-CM

## 2016-05-08 DIAGNOSIS — M19072 Primary osteoarthritis, left ankle and foot: Secondary | ICD-10-CM

## 2016-05-08 DIAGNOSIS — Z8719 Personal history of other diseases of the digestive system: Secondary | ICD-10-CM

## 2016-05-08 DIAGNOSIS — M19042 Primary osteoarthritis, left hand: Secondary | ICD-10-CM

## 2016-05-08 LAB — CBC WITH DIFFERENTIAL/PLATELET
Basophils Absolute: 50 cells/uL (ref 0–200)
Basophils Relative: 1 %
Eosinophils Absolute: 50 cells/uL (ref 15–500)
Eosinophils Relative: 1 %
HCT: 37.8 % (ref 35.0–45.0)
Hemoglobin: 12.2 g/dL (ref 11.7–15.5)
Lymphocytes Relative: 35 %
Lymphs Abs: 1750 cells/uL (ref 850–3900)
MCH: 28.7 pg (ref 27.0–33.0)
MCHC: 32.3 g/dL (ref 32.0–36.0)
MCV: 88.9 fL (ref 80.0–100.0)
MPV: 9.6 fL (ref 7.5–12.5)
Monocytes Absolute: 600 cells/uL (ref 200–950)
Monocytes Relative: 12 %
Neutro Abs: 2550 cells/uL (ref 1500–7800)
Neutrophils Relative %: 51 %
Platelets: 199 10*3/uL (ref 140–400)
RBC: 4.25 MIL/uL (ref 3.80–5.10)
RDW: 14 % (ref 11.0–15.0)
WBC: 5 10*3/uL (ref 3.8–10.8)

## 2016-05-08 LAB — COMPLETE METABOLIC PANEL WITH GFR
ALT: 12 U/L (ref 6–29)
AST: 18 U/L (ref 10–35)
Albumin: 4.4 g/dL (ref 3.6–5.1)
Alkaline Phosphatase: 57 U/L (ref 33–130)
BUN: 12 mg/dL (ref 7–25)
CO2: 24 mmol/L (ref 20–31)
Calcium: 9.4 mg/dL (ref 8.6–10.4)
Chloride: 100 mmol/L (ref 98–110)
Creat: 0.82 mg/dL (ref 0.60–0.93)
GFR, Est African American: 82 mL/min (ref >=60)
GFR, Est Non African American: 71 mL/min (ref >=60)
Glucose, Bld: 72 mg/dL (ref 65–99)
Potassium: 4.2 mmol/L (ref 3.5–5.3)
Sodium: 139 mmol/L (ref 135–146)
Total Bilirubin: 1.2 mg/dL (ref 0.2–1.2)
Total Protein: 6.8 g/dL (ref 6.1–8.1)

## 2016-05-08 MED ORDER — HYDROXYCHLOROQUINE SULFATE 200 MG PO TABS: ORAL_TABLET | ORAL | 0 refills | Status: DC

## 2016-05-08 MED ORDER — DICLOFENAC SODIUM 1 % TD GEL: 2.0000 g | Freq: Four times a day (QID) | TRANSDERMAL | 1 refills | Status: DC

## 2016-05-08 NOTE — Progress Notes (Signed)
Rheumatology Medication Review by a Pharmacist Does the patient feel that his/her medications are working for him/her?  Yes Has the patient been experiencing any side effects to the medications prescribed?  No Does the patient have any problems obtaining medications?  No  Issues to address at subsequent visits: None   Pharmacist comments:  Michele Bryan is a pleasant 76 yo F who presents for follow up of her rheumatoid arthritis.  She is currently taking hydroxychloroquine 200 mg BID.  Reviewed with patient that she should be taking hydroxychloroquine 200 mg BID Monday through Friday.  Her most recent standing labs were on 10/20/15 which were normal.  She is due for standing labs today.  Most recent eye exam was on 05/17/15 which was normal.  She is due for eye exam.  Patient reports she has had cataract surgery since March 2017 and she thinks they did the hydroxychloroquine eye exam.  Patient will check with Dr. Ashley Royalty office to see if she had hydroxychloroquine eye exam more recently than March 2017 and will have his office send [place] those records.  Patient reports she will schedule hydroxychloroquine eye exam if she has not had one since March 2017.  Provided patient with eye exam form.  Patient denies any questions or concerns regarding her medications at this time.    Michele Bryan, Pharm.D., BCPS, CPP Clinical Pharmacist Pager: [phone] Phone: [phone] 05/08/2016 3:21 PM

## 2016-05-08 NOTE — Addendum Note (Signed)
Addended byCaffie Damme on: 05/08/2016 03:27 PM   Modules accepted: Orders

## 2016-05-09 NOTE — Progress Notes (Signed)
wnl

## 2016-05-12 ENCOUNTER — Telehealth: Payer: Self-pay | Admitting: Radiology

## 2016-05-12 NOTE — Telephone Encounter (Signed)
I have called patient to advise labs are normal  

## 2016-05-12 NOTE — Telephone Encounter (Signed)
-----   Message from Pollyann Savoy, MD sent at 05/09/2016  1:31 PM EST ----- wnl

## 2016-05-13 ENCOUNTER — Telehealth: Payer: Self-pay

## 2016-05-13 NOTE — Telephone Encounter (Signed)
Received a conformation from CoverMyMeds regarding a prior authorization approval for Diclofenac Gel through 03/02/17.   Reference [id number] Phone number: [phone]  Tried to call patient to update her on the results. Her voicemail has not been set up yet so I could not leave a message.   Nason Conradt, [place], CPhT   1:42 PM

## 2016-10-06 NOTE — Progress Notes (Signed)
Office Visit Note  Patient: Michele Bryan             Date of Birth: February 01, 1941           MRN: [id number]             PCP: Sigmund Hazel, MD Referring: Sigmund Hazel, MD Visit Date: 10/09/2016 Occupation: @GUAROCC @    Subjective:  Medication Management. Left shoulder pain.   History of Present Illness: Michele Bryan is a 76 y.o. female with history of sero positive rheumatoid arthritis. She states that she fell in December 2016   and fractured her left wrist. At the time she landed up on her left shoulder. She states her left shoulder joint continues to hurt off and on. His been hurting a lot recently. She's been going to a chiropractor was done some therapy and laser treatment and if not improved much. Her right total knee replacement is doing well. Her left knee has been hurting to some extent. She's been using any brace for that.   Activities of Daily Living:  Patient reports morning stiffness for 15 minutes.   Patient Denies nocturnal pain.  Difficulty dressing/grooming: Denies Difficulty climbing stairs: Reports Difficulty getting out of chair: Reports Difficulty using hands for taps, buttons, cutlery, and/or writing: Denies   Review of Systems  Constitutional: Positive for fatigue. Negative for night sweats, weight gain, weight loss and weakness.  HENT: Negative for mouth sores, trouble swallowing, trouble swallowing, mouth dryness and nose dryness.   Eyes: Negative for pain, redness, visual disturbance and dryness.  Respiratory: Negative for cough, shortness of breath and difficulty breathing.   Cardiovascular: Negative for chest pain, palpitations, hypertension, irregular heartbeat and swelling in legs/feet.  Gastrointestinal: Negative for blood in stool, constipation and diarrhea.  Endocrine: Negative for increased urination.  Genitourinary: Negative for vaginal dryness.  Musculoskeletal: Positive for arthralgias, joint pain and morning stiffness. Negative for joint  swelling, myalgias, muscle weakness, muscle tenderness and myalgias.  Skin: Negative for color change, rash, hair loss, skin tightness, ulcers and sensitivity to sunlight.  Allergic/Immunologic: Negative for susceptible to infections.  Neurological: Negative for dizziness, memory loss and night sweats.  Hematological: Negative for swollen glands.  Psychiatric/Behavioral: Positive for sleep disturbance. Negative for depressed mood. The patient is not nervous/anxious.     PMFS History:  Patient Active Problem List   Diagnosis Date Noted  . 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 High risk medication use 05/06/2016  . Primary osteoarthritis of both hands 05/06/2016  . Primary osteoarthritis of left knee 05/06/2016  . Primary osteoarthritis of both feet 05/06/2016  . Dyslipidemia 05/06/2016  . Peripheral neuropathy 05/06/2016  . Diverticulosis of intestine without bleeding 05/06/2016  . Osteopenia  05/06/2016  . Vitamin D deficiency 05/06/2016  . 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 History of GI bleed 05/06/2016  . Cataract of both eyes 05/06/2016  . Urinary urgency 04/30/2015  . Chronic constipation 04/30/2015  . Multiple sclerosis (HCC) 03/23/2013  . Expected blood loss anemia 06/16/2012  . 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 Anemia   . Arthritis   . GERD (gastroesophageal reflux disease)   . History of blood transfusion   . Hyperlipidemia   . MS (multiple sclerosis) (HCC)   . Neuromuscular disorder (HCC)    multiple scleroosis/peripheral neuropathy  . PONV (postoperative nausea and vomiting)   . Rheumatoid arthritis (HCC)   .  Ulcer   . Vitamin D deficiency     Family History  Problem Relation Age of Onset  . Heart attack Father    Past Surgical History:  Procedure Laterality Date  . breast augumentation    . BREAST SURGERY     biopsy  . KNEE  ARTHROSCOPY Right   . THYROID LOBECTOMY    . TOTAL KNEE ARTHROPLASTY Right 06/15/2012   Procedure: RIGHT TOTAL KNEE ARTHROPLASTY;  Surgeon: [name], MD;  Location: [facility];  Service: Orthopedics;  Laterality: Right;  . WRIST SURGERY Left    Social History   Social History Narrative   Patient lives at home with her husband [name]).   Retired.   Education- college.   Caffeine- Two cups of decaf tea daily.   Right handed.           Objective: Vital Signs: BP 120/74   Pulse 78   Resp 16   Ht 5' 4.5" (1.638 m)   Wt 112 lb (50.8 kg)   BMI 18.93 kg/m    Physical Exam  Constitutional: She is oriented to person, place, and time. She appears well-developed and well-nourished.  HENT:  Head: Normocephalic and atraumatic.  Eyes: Conjunctivae and EOM are normal.  Neck: Normal range of motion.  Cardiovascular: Normal rate, regular rhythm, normal heart sounds and intact distal pulses.   Pulmonary/Chest: Effort normal and breath sounds normal.  Abdominal: Soft. Bowel sounds are normal.  Lymphadenopathy:    She has no cervical adenopathy.  Neurological: She is alert and oriented to person, place, and time.  Skin: Skin is warm and dry. Capillary refill takes less than 2 seconds.  Psychiatric: She has a normal mood and affect. Her behavior is normal.  Nursing note and vitals reviewed.    Musculoskeletal Exam: C-spine good range of motion. She has thoracic kyphosis. Lumbar spine limited range of motion. She is painful range of motion of her left shoulder joint with abduction limited to 110. Elbow joints with good range of motion. She has no synovial thickening over her wrist joints. She is some synovial thickening over right second MCP joint. She has osteoarthritic changes in all PIP/DIP joints with inflammatory component. Hip joints are good range of motion. Her right total knee replacement is doing well. She is some crepitus with range of motion of her left knee. No warmth swelling or  effusion noted in her ankles or MTP joints.  CDAI Exam: CDAI Homunculus Exam:   Tenderness:  LUE: glenohumeral Right hand: 2nd MCP, 2nd PIP, 3rd PIP, 4th PIP and 5th PIP Left hand: 2nd PIP, 3rd PIP, 4th PIP and 5th PIP  Swelling:  Right hand: 2nd PIP, 3rd PIP, 4th PIP and 5th PIP Left hand: 2nd PIP, 3rd PIP, 4th PIP and 5th PIP  Joint Counts:  CDAI Tender Joint count: 10 CDAI Swollen Joint count: 8  Global Assessments:  Patient Global Assessment: 5 Provider Global Assessment: 5  CDAI Calculated Score: 28    Investigation: Findings:  06/30/2016 eye exam negative for plaquenil toxicity   CBC Latest Ref Rng & Units 05/08/2016 06/16/2012  WBC 3.8 - 10.8 K/uL 5.0 10.6(H)  Hemoglobin 11.7 - 15.5 g/dL 23.7 10.0(L)  Hematocrit 35.0 - 45.0 % 37.8 30.4(L)  Platelets 140 - 400 K/uL 199 177    CMP Latest Ref Rng & Units 05/08/2016 06/16/2012  Glucose 65 - 99 mg/dL 72 628(B)  BUN 7 - 25 mg/dL 12 12  Creatinine 1.51 - 0.93 mg/dL 7.61 6.07  Sodium  135 - 146 mmol/L 139 139  Potassium 3.5 - 5.3 mmol/L 4.2 4.0  Chloride 98 - 110 mmol/L 100 106  CO2 20 - 31 mmol/L 24 28  Calcium 8.6 - 10.4 mg/dL 9.4 8.4  Total Protein 6.1 - 8.1 g/dL 6.8 -  Total Bilirubin 0.2 - 1.2 mg/dL 1.2 -  Alkaline Phos 33 - 130 U/L 57 -  AST 10 - 35 U/L 18 -  ALT 6 - 29 U/L 12 -    Imaging: Xr Shoulder Left  Result Date: 10/09/2016 No significant glenohumeral joint narrowing was noted. She has an inferior spur. No significant acromioclavicular joint space narrowing was noted.   Speciality Comments: No specialty comments available.    Procedures:  Large Joint Inj Date/Time: 10/09/2016 2:50 PM Performed by: Pollyann Savoy Authorized by: Pollyann Savoy   Consent Given by:  Patient Site marked: the procedure site was marked   Timeout: prior to procedure the correct patient, procedure, and site was verified   Indications:  Pain Location:  Shoulder Site:  L glenohumeral Prep: patient was prepped  and draped in usual sterile fashion   Needle Size:  27 G Needle Length:  1.5 inches Approach:  Posterior Ultrasound Guidance: No   Fluoroscopic Guidance: No   Arthrogram: No   Medications:  1 mL lidocaine 1 %; 40 mg triamcinolone acetonide 40 MG/ML Aspiration Attempted: Yes   Aspirate amount (mL):  0 Patient tolerance:  Patient tolerated the procedure well with no immediate complications   Allergies: Codeine and Demerol [meperidine]   Assessment / Plan:     Visit Diagnoses: Rheumatoid arthritis involving multiple sites with positive rheumatoid factor (HCC) - +RF, -anti-CCP. She has no synovitis over her wrist joint or MCP joints and she's been tolerating Plaquenil quite well.  High risk medication use - Plaquenil bid M-F. Her labs were normal in March. We will check her labs again today. Her eye exams have been stable.   Chronic left shoulder pain - Plan: XR Shoulder Left showed inferior spur.  Primary osteoarthritis of both hands: She is inflammatory component to osteoarthritis causes significant discomfort and stiffness.  Primary osteoarthritis of left knee: Chronic pain  Status post right knee replacement: Doing well  Primary osteoarthritis of both feet: Proper fitting shoes were discussed.  Postural kyphosis of thoracic region  Osteopenia : Patient reports her bone densities have been stable.  History of GI bleed  Vitamin D deficiency: She is on vitamin D supplement.  History of multiple sclerosis  History of peripheral neuropathy  History of diverticulosis  History of cataract    Orders: Orders Placed This Encounter  Procedures  . Large Joint Injection/Arthrocentesis  . XR Shoulder Left  . CBC with Differential/Platelet  . COMPLETE METABOLIC PANEL WITH GFR   No orders of the defined types were placed in this encounter.    Follow-Up Instructions: Return in about 5 months (around 03/11/2017) for Rheumatoid arthritis, Osteoarthritis.   Pollyann Savoy,  MD  Note - This record has been created using Animal nutritionist.  Chart creation errors have been sought, but may not always  have been located. Such creation errors do not reflect on  the standard of medical care.

## 2016-10-09 ENCOUNTER — Encounter: Payer: Self-pay | Admitting: Rheumatology

## 2016-10-09 ENCOUNTER — Ambulatory Visit (INDEPENDENT_AMBULATORY_CARE_PROVIDER_SITE_OTHER): Payer: Medicare Other

## 2016-10-09 ENCOUNTER — Ambulatory Visit (INDEPENDENT_AMBULATORY_CARE_PROVIDER_SITE_OTHER): Payer: Medicare Other | Admitting: Rheumatology

## 2016-10-09 VITALS — BP 120/74 | HR 78 | Resp 16 | Ht 64.5 in | Wt 112.0 lb

## 2016-10-09 DIAGNOSIS — Z8669 Personal history of other diseases of the nervous system and sense organs: Secondary | ICD-10-CM

## 2016-10-09 DIAGNOSIS — M19072 Primary osteoarthritis, left ankle and foot: Secondary | ICD-10-CM

## 2016-10-09 DIAGNOSIS — M4004 Postural kyphosis, thoracic region: Secondary | ICD-10-CM | POA: Diagnosis not present

## 2016-10-09 DIAGNOSIS — M25512 Pain in left shoulder: Secondary | ICD-10-CM

## 2016-10-09 DIAGNOSIS — M0579 Rheumatoid arthritis with rheumatoid factor of multiple sites without organ or systems involvement: Secondary | ICD-10-CM | POA: Diagnosis not present

## 2016-10-09 DIAGNOSIS — M19041 Primary osteoarthritis, right hand: Secondary | ICD-10-CM

## 2016-10-09 DIAGNOSIS — Z96651 Presence of right artificial knee joint: Secondary | ICD-10-CM

## 2016-10-09 DIAGNOSIS — M8589 Other specified disorders of bone density and structure, multiple sites: Secondary | ICD-10-CM | POA: Diagnosis not present

## 2016-10-09 DIAGNOSIS — G8929 Other chronic pain: Secondary | ICD-10-CM | POA: Diagnosis not present

## 2016-10-09 DIAGNOSIS — M19071 Primary osteoarthritis, right ankle and foot: Secondary | ICD-10-CM

## 2016-10-09 DIAGNOSIS — M19042 Primary osteoarthritis, left hand: Secondary | ICD-10-CM

## 2016-10-09 DIAGNOSIS — Z8719 Personal history of other diseases of the digestive system: Secondary | ICD-10-CM | POA: Diagnosis not present

## 2016-10-09 DIAGNOSIS — E559 Vitamin D deficiency, unspecified: Secondary | ICD-10-CM | POA: Diagnosis not present

## 2016-10-09 DIAGNOSIS — M1712 Unilateral primary osteoarthritis, left knee: Secondary | ICD-10-CM | POA: Diagnosis not present

## 2016-10-09 DIAGNOSIS — G35 Multiple sclerosis: Secondary | ICD-10-CM

## 2016-10-09 DIAGNOSIS — Z79899 Other long term (current) drug therapy: Secondary | ICD-10-CM

## 2016-10-09 LAB — COMPLETE METABOLIC PANEL WITH GFR
ALT: 12 U/L (ref 6–29)
AST: 19 U/L (ref 10–35)
Albumin: 4.5 g/dL (ref 3.6–5.1)
Alkaline Phosphatase: 52 U/L (ref 33–130)
BUN: 21 mg/dL (ref 7–25)
CO2: 26 mmol/L (ref 20–32)
Calcium: 9.8 mg/dL (ref 8.6–10.4)
Chloride: 101 mmol/L (ref 98–110)
Creat: 0.91 mg/dL (ref 0.60–0.93)
GFR, Est African American: 72 mL/min (ref >=60)
GFR, Est Non African American: 63 mL/min (ref >=60)
Glucose, Bld: 80 mg/dL (ref 65–99)
Potassium: 4.7 mmol/L (ref 3.5–5.3)
Sodium: 136 mmol/L (ref 135–146)
Total Bilirubin: 0.9 mg/dL (ref 0.2–1.2)
Total Protein: 6.9 g/dL (ref 6.1–8.1)

## 2016-10-09 LAB — CBC WITH DIFFERENTIAL/PLATELET
Basophils Absolute: 62 cells/uL (ref 0–200)
Basophils Relative: 1 %
Eosinophils Absolute: 186 cells/uL (ref 15–500)
Eosinophils Relative: 3 %
HCT: 39.8 % (ref 35.0–45.0)
Hemoglobin: 12.9 g/dL (ref 11.7–15.5)
Lymphocytes Relative: 35 %
Lymphs Abs: 2170 cells/uL (ref 850–3900)
MCH: 29.6 pg (ref 27.0–33.0)
MCHC: 32.4 g/dL (ref 32.0–36.0)
MCV: 91.3 fL (ref 80.0–100.0)
MPV: 9.7 fL (ref 7.5–12.5)
Monocytes Absolute: 496 cells/uL (ref 200–950)
Monocytes Relative: 8 %
Neutro Abs: 3286 cells/uL (ref 1500–7800)
Neutrophils Relative %: 53 %
Platelets: 212 10*3/uL (ref 140–400)
RBC: 4.36 MIL/uL (ref 3.80–5.10)
RDW: 14.2 % (ref 11.0–15.0)
WBC: 6.2 10*3/uL (ref 3.8–10.8)

## 2016-10-09 MED ORDER — LIDOCAINE HCL 1 % IJ SOLN
1.0000 mL | INTRAMUSCULAR | Status: AC | PRN
  Administered 2016-10-09: 1 mL

## 2016-10-09 MED ORDER — TRIAMCINOLONE ACETONIDE 40 MG/ML IJ SUSP
40.0000 mg | INTRAMUSCULAR | Status: AC | PRN
  Administered 2016-10-09: 40 mg via INTRA_ARTICULAR

## 2016-10-09 NOTE — Progress Notes (Signed)
wnl

## 2017-02-10 ENCOUNTER — Other Ambulatory Visit: Payer: Self-pay | Admitting: Rheumatology

## 2017-02-11 NOTE — Telephone Encounter (Addendum)
Last Visit: 10/09/16 Next Visit: 07/09/17 Labs: 10/09/16 WNL plaquenil eye exam normal 06/30/16   Okay to refill per [name]

## 2017-02-17 ENCOUNTER — Other Ambulatory Visit: Payer: Self-pay | Admitting: Orthopedic Surgery

## 2017-02-17 DIAGNOSIS — M19012 Primary osteoarthritis, left shoulder: Secondary | ICD-10-CM

## 2017-03-02 ENCOUNTER — Ambulatory Visit
Admission: RE | Admit: 2017-03-02 | Discharge: 2017-03-02 | Disposition: A | Discharge status: Home / Self Care | Payer: Medicare Other | Source: Ambulatory Visit | Attending: Orthopedic Surgery | Admitting: Orthopedic Surgery

## 2017-03-02 DIAGNOSIS — M19012 Primary osteoarthritis, left shoulder: Secondary | ICD-10-CM

## 2017-03-06 NOTE — Progress Notes (Signed)
Office Visit Note  Patient: Michele Bryan             Date of Birth: [birth year]           MRN: [id number]             PCP: Sigmund Hazel, MD Referring: Sigmund Hazel, MD Visit Date: 03/19/2017 Occupation: @GUAROCC @    Subjective:  Other (increased pain/ aches )   History of Present Illness: Michele Bryan is a 77 y.o. female  with history of rheumatoid arthritis and osteoarthritis overlap. She states recently she's been having increased pain and discomfort in her joints. She gives history of swelling and pain in her bilateral hands and her bilateral feet. She has left shoulder rotator cuff tear and is awaiting surgery for that. Her right total knee replacement is doing well. The left knee joint causes some discomfort.  Activities of Daily Living:  Patient reports morning stiffness for 4 hours.   Patient Reports nocturnal pain.  Difficulty dressing/grooming: Reports Difficulty climbing stairs: Reports Difficulty getting out of chair: Reports Difficulty using hands for taps, buttons, cutlery, and/or writing: Reports   Review of Systems  Constitutional: Positive for fatigue. Negative for night sweats, weight gain, weight loss and weakness.  HENT: Positive for mouth dryness. Negative for mouth sores, trouble swallowing, trouble swallowing and nose dryness.   Eyes: Positive for dryness. Negative for pain, redness and visual disturbance.  Respiratory: Negative for cough, shortness of breath and difficulty breathing.   Cardiovascular: Negative for chest pain, palpitations, hypertension, irregular heartbeat and swelling in legs/feet.  Gastrointestinal: Negative for blood in stool, constipation and diarrhea.  Endocrine: Negative for increased urination.  Genitourinary: Negative for vaginal dryness.  Musculoskeletal: Positive for arthralgias, joint pain and morning stiffness. Negative for joint swelling, myalgias, muscle weakness, muscle tenderness and myalgias.  Skin: Negative for color  change, rash, hair loss, skin tightness, ulcers and sensitivity to sunlight.  Allergic/Immunologic: Negative for susceptible to infections.  Neurological: Negative for dizziness, memory loss and night sweats.  Hematological: Negative for swollen glands.  Psychiatric/Behavioral: Positive for depressed mood and sleep disturbance. The patient is not nervous/anxious.     PMFS History:  Patient Active Problem List   Diagnosis Date Noted  . 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 High risk medication use 05/06/2016  . Primary osteoarthritis of both hands 05/06/2016  . Primary osteoarthritis of left knee 05/06/2016  . Primary osteoarthritis of both feet 05/06/2016  . Dyslipidemia 05/06/2016  . Peripheral neuropathy 05/06/2016  . Diverticulosis of intestine without bleeding 05/06/2016  . Osteopenia  05/06/2016  . Vitamin D deficiency 05/06/2016  . 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 History of GI bleed 05/06/2016  . Cataract of both eyes 05/06/2016  . Urinary urgency 04/30/2015  . Chronic constipation 04/30/2015  . Multiple sclerosis (HCC) 03/23/2013  . Expected blood loss anemia 06/16/2012  . 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 Anemia   . Arthritis   . GERD (gastroesophageal reflux disease)   . History of blood transfusion   . Hyperlipidemia   . MS (multiple sclerosis) (HCC)   . Neuromuscular disorder (HCC)    multiple scleroosis/peripheral neuropathy  . PONV (postoperative nausea and vomiting)   . Rheumatoid arthritis (HCC)   . Ulcer   . Vitamin D deficiency     Family History  Problem Relation Age of Onset  . Heart attack  Father    Past Surgical History:  Procedure Laterality Date  . breast augumentation    . BREAST SURGERY     biopsy  . KNEE ARTHROSCOPY Right   . THYROID LOBECTOMY    . TOTAL KNEE ARTHROPLASTY Right 06/15/2012   Procedure: RIGHT TOTAL  KNEE ARTHROPLASTY;  Surgeon: [name], MD;  Location: [facility];  Service: Orthopedics;  Laterality: Right;  . WRIST SURGERY Left    Social History   Social History Narrative   Patient lives at home with her husband Michele Bryan).   Retired.   Education- college.   Caffeine- Two cups of decaf tea daily.   Right handed.           Objective: Vital Signs: BP 111/70 (BP Location: Left Arm, Patient Position: Sitting, Cuff Size: Normal)   Pulse 77   Resp 14   Ht 5\' 5"  (1.651 m)   Wt 123 lb (55.8 kg)   BMI 20.47 kg/m    Physical Exam  Constitutional: She is oriented to person, place, and time. She appears well-developed and well-nourished.  HENT:  Head: Normocephalic and atraumatic.  Eyes: Conjunctivae and EOM are normal.  Neck: Normal range of motion.  Cardiovascular: Normal rate, regular rhythm, normal heart sounds and intact distal pulses.  Pulmonary/Chest: Effort normal and breath sounds normal.  Abdominal: Soft. Bowel sounds are normal.  Lymphadenopathy:    She has no cervical adenopathy.  Neurological: She is alert and oriented to person, place, and time.  Skin: Skin is warm and dry. Capillary refill takes less than 2 seconds.  Psychiatric: She has a normal mood and affect. Her behavior is normal.  Nursing note and vitals reviewed.    Musculoskeletal Exam: C-spine limited range of motion. She has thoracic kyphosis. Lumbar spine limited range of motion. Left shoulder joint was in a sling. She has synovitis in her MCPs and PIPs as described below. Her right total knee replacement is doing well. She is some discomfort in her left knee joint was some warmth. She is tenderness across MTP joints.  CDAI Exam: CDAI Homunculus Exam:   Tenderness:  RUE: wrist LUE: glenohumeral Right hand: 1st MCP, 2nd MCP, 3rd MCP, 5th MCP, 2nd PIP and 4th PIP Left hand: 2nd MCP, 3rd MCP, 2nd PIP and 3rd PIP LLE: tibiofemoral Right foot: 3rd MTP and 4th MTP Left foot: 2nd MTP  Swelling:    Right hand: 2nd MCP and 3rd PIP Left hand: 2nd MCP, 3rd MCP and 2nd PIP  Joint Counts:  CDAI Tender Joint count: 13 CDAI Swollen Joint count: 5  Global Assessments:  Patient Global Assessment: 5 Provider Global Assessment: 5  CDAI Calculated Score: 28    Investigation: No additional findings.PLQ eye exam: 06/30/2016 CBC Latest Ref Rng & Units 10/09/2016 05/08/2016 06/16/2012  WBC 3.8 - 10.8 K/uL 6.2 5.0 10.6(H)  Hemoglobin 11.7 - 15.5 g/dL 06/18/2012 56.2 10.0(L)  Hematocrit 35.0 - 45.0 % 39.8 37.8 30.4(L)  Platelets 140 - 400 K/uL 212 199 177   CMP Latest Ref Rng & Units 10/09/2016 05/08/2016 06/16/2012  Glucose 65 - 99 mg/dL 80 72 06/18/2012)  BUN 7 - 25 mg/dL 21 12 12   Creatinine 0.60 - 0.93 mg/dL 865(H 8.46  Sodium 135 - 146 mmol/L 136 139 139  Potassium 3.5 - 5.3 mmol/L 4.7 4.2 4.0  Chloride 98 - 110 mmol/L 101 100 106  CO2 20 - 32 mmol/L 26 24 28   Calcium 8.6 - 10.4 mg/dL 9.8 9.4 8.4  Total Protein 6.1 -  8.1 g/dL 6.9 6.8 -  Total Bilirubin 0.2 - 1.2 mg/dL 0.9 1.2 -  Alkaline Phos 33 - 130 U/L 52 57 -  AST 10 - 35 U/L 19 18 -  ALT 6 - 29 U/L 12 12 -   12/01/2016 CBC normal, vitamin D 37.3, B12 normal Imaging: Ct Shoulder Left Wo Contrast  Result Date: 03/02/2017 CLINICAL DATA:  Left shoulder pain for 2 years. EXAM: CT OF THE UPPER LEFT EXTREMITY WITHOUT CONTRAST TECHNIQUE: Multidetector CT imaging of the upper left extremity was performed according to the standard protocol. COMPARISON:  Radiographs 10/09/2016 FINDINGS: Advanced glenohumeral joint degenerative changes with probable full-thickness cartilage loss, joint space narrowing, osteophytic spurring and subchondral cystic change. There is also a large joint effusion and significant subcoracoid and subacromial/subdeltoid bursitis. The [facility] joint is intact. There is mild narrowing of the humeroacromial space and the humeral head is riding high in the glenoid fossa. This would suggest rotator cuff disease. Suspect full-thickness  rotator cuff tear. The visualized left ribs are intact. A left breast prosthesis is noted. The visualized left lung is grossly clear. IMPRESSION: 1. Advanced glenohumeral joint degenerative changes as discussed above. 2. Large joint effusion and significant subacromial/subdeltoid bursitis and subcoracoid bursitis. 3. Suspect full-thickness rotator cuff tear. 4. No acute bony findings. Electronically Signed   By: Rudie Meyer M.D.   On: 03/02/2017 13:12    Speciality Comments: No specialty comments available.    Procedures:  No procedures performed Allergies: Codeine and Demerol [meperidine]   Assessment / Plan:     Visit Diagnoses: 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  -patient is having a flare of rheumatoid arthritis with increased pain and swelling in multiple joints. She has inadequate response to Plaquenil monotherapy. Different treatment options and their side effects were discussed at length. After reviewing indications Contraindications she wanted to proceed with methotrexate. Handout was given consent was taken today. I'll obtain following labs today. And will start her on methotrexate. The plan is to start her on 4 tablets by mouth every week along with folic acid 1 mg by mouth daily if her labs stay stable at 2 weeks then we will increase her dose to 6 tablets by mouth every week. Her labs will be checked in 2 weeks again and then every 2 months to monitor for drug toxicity. Plan: DG Chest 2 View  Drug Counseling TB Gold: 11/08/2015 Hepatitis panel: August 2015  Chest-xray:  Pending  Contraception: Not indicated  Alcohol use: Discussed  Patient was counseled on the purpose, proper use, and adverse effects of methotrexate including nausea, infection, and signs and symptoms of pneumonitis.  Reviewed instructions with patient to take methotrexate weekly along with folic acid daily.  Discussed the importance of frequent monitoring  of kidney and liver function and blood counts, and provided patient with standing lab instructions.  Counseled patient to avoid NSAIDs and alcohol while on methotrexate.  Provided patient with educational materials on methotrexate and answered all questions.  Advised patient to get annual influenza vaccine and to get a pneumococcal vaccine if patient has not already had one.  Patient voiced understanding.  Patient consented to methotrexate use.  Will upload into chart.    High risk medication use - Plaquenil bid M-Feye exam: 06/30/2016 - Plan: COMPLETE METABOLIC PANEL WITH GFR, HIV antibody, IgG, IgA, IgM, CBC with Differential/Platelet, COMPLETE METABOLIC PANEL WITH GFR  Primary osteoarthritis of both hands: She does have severe osteoarthritis in her hands.  Primary osteoarthritis  of left knee: Chronic pain  Primary osteoarthritis of both feet: She has osteoarthritis and rheumatoid arthritis overlap the discomfort in her feet.  Status post right knee replacement: Doing well  Left rotator cuff tear: She is awaiting surgery by Dr. Ranell Patrick.  Postural kyphosis of thoracic region  Osteopenia  - Patient reports her bone densities have been stable. She is on Fosamax per her PCP.  Dyslipidemia  History of cataract  History of GI bleed  History of diverticulosis  History of vitamin D deficiency - She is on vitamin D supplement.  History of multiple sclerosis  History of peripheral neuropathy  Personal history of immunosuppressive therapy - Plan: DG Chest 2 View    Orders: Orders Placed This Encounter  Procedures  . DG Chest 2 View  . COMPLETE METABOLIC PANEL WITH GFR  . HIV antibody  . IgG, IgA, IgM  . CBC with Differential/Platelet  . COMPLETE METABOLIC PANEL WITH GFR   No orders of the defined types were placed in this encounter.   Face-to-face time spent with patient was 30 minutes. Greater than 50% of time was spent in counseling and coordination of care.  Follow-Up  Instructions: Return in about 2 months (around 05/17/2017) for Rheumatoid arthritis, Osteoarthritis.   Pollyann Savoy, MD  Note - This record has been created using Animal nutritionist.  Chart creation errors have been sought, but may not always  have been located. Such creation errors do not reflect on  the standard of medical care.

## 2017-03-19 ENCOUNTER — Ambulatory Visit: Payer: Medicare Other | Admitting: Rheumatology

## 2017-03-19 ENCOUNTER — Encounter: Payer: Self-pay | Admitting: Rheumatology

## 2017-03-19 ENCOUNTER — Encounter (INDEPENDENT_AMBULATORY_CARE_PROVIDER_SITE_OTHER): Payer: Self-pay

## 2017-03-19 ENCOUNTER — Ambulatory Visit (HOSPITAL_COMMUNITY)
Admission: RE | Admit: 2017-03-19 | Discharge: 2017-03-19 | Disposition: A | Discharge status: Home / Self Care | Payer: Medicare Other | Source: Ambulatory Visit | Attending: Rheumatology | Admitting: Rheumatology

## 2017-03-19 VITALS — BP 111/70 | HR 77 | Resp 14 | Ht 65.0 in | Wt 123.0 lb

## 2017-03-19 DIAGNOSIS — M19072 Primary osteoarthritis, left ankle and foot: Secondary | ICD-10-CM

## 2017-03-19 DIAGNOSIS — M19071 Primary osteoarthritis, right ankle and foot: Secondary | ICD-10-CM

## 2017-03-19 DIAGNOSIS — M4004 Postural kyphosis, thoracic region: Secondary | ICD-10-CM

## 2017-03-19 DIAGNOSIS — Z8719 Personal history of other diseases of the digestive system: Secondary | ICD-10-CM | POA: Diagnosis not present

## 2017-03-19 DIAGNOSIS — Z79899 Other long term (current) drug therapy: Secondary | ICD-10-CM

## 2017-03-19 DIAGNOSIS — Z8669 Personal history of other diseases of the nervous system and sense organs: Secondary | ICD-10-CM | POA: Diagnosis not present

## 2017-03-19 DIAGNOSIS — M8589 Other specified disorders of bone density and structure, multiple sites: Secondary | ICD-10-CM

## 2017-03-19 DIAGNOSIS — Z9225 Personal history of immunosupression therapy: Secondary | ICD-10-CM

## 2017-03-19 DIAGNOSIS — M19041 Primary osteoarthritis, right hand: Secondary | ICD-10-CM

## 2017-03-19 DIAGNOSIS — Z8639 Personal history of other endocrine, nutritional and metabolic disease: Secondary | ICD-10-CM

## 2017-03-19 DIAGNOSIS — G35 Multiple sclerosis: Secondary | ICD-10-CM

## 2017-03-19 DIAGNOSIS — Z96651 Presence of right artificial knee joint: Secondary | ICD-10-CM

## 2017-03-19 DIAGNOSIS — E785 Hyperlipidemia, unspecified: Secondary | ICD-10-CM

## 2017-03-19 DIAGNOSIS — M0579 Rheumatoid arthritis with rheumatoid factor of multiple sites without organ or systems involvement: Secondary | ICD-10-CM

## 2017-03-19 DIAGNOSIS — M1712 Unilateral primary osteoarthritis, left knee: Secondary | ICD-10-CM | POA: Diagnosis not present

## 2017-03-19 DIAGNOSIS — M19042 Primary osteoarthritis, left hand: Secondary | ICD-10-CM

## 2017-03-19 NOTE — Progress Notes (Signed)
Chest xray is normal

## 2017-03-19 NOTE — Patient Instructions (Signed)
Standing Labs We placed an order today for your standing lab work.    Please come back and get your standing labs in 2 weeks x2, then every 2 months.   We have open lab Monday through Friday from 8:30-11:30 AM and 1:30-4 PM at the office of Dr. Pollyann Savoy.   The office is located at [address], [place], [place] [address] No appointment is necessary.   Labs are drawn by [facility].  You may receive a bill from [facility] for your lab work. If you have any questions regarding directions or hours of operation,  please call [phone].         Methotrexate tablets What is this medicine? METHOTREXATE (METH oh TREX ate) is a chemotherapy drug used to treat cancer including breast cancer, leukemia, and lymphoma. This medicine can also be used to treat psoriasis and certain kinds of arthritis. This medicine may be used for other purposes; ask your health care provider or pharmacist if you have questions. COMMON BRAND NAME(S): Rheumatrex, Trexall What should I tell my health care provider before I take this medicine? They need to know if you have any of these conditions: -fluid in the stomach area or lungs -if you often drink alcohol -infection or immune system problems -kidney disease or on hemodialysis -liver disease -low blood counts, like low white cell, platelet, or red cell counts -lung disease -radiation therapy -stomach ulcers -ulcerative colitis -an unusual or allergic reaction to methotrexate, other medicines, foods, dyes, or preservatives -pregnant or trying to get pregnant -breast-feeding How should I use this medicine? Take this medicine by mouth with a glass of water. Follow the directions on the prescription label. Take your medicine at regular intervals. Do not take it more often than directed. Do not stop taking except on your doctor's advice. Make sure you know why you are taking this medicine and how often you should take it. If this medicine is used  for a condition that is not cancer, like arthritis or psoriasis, it should be taken weekly, NOT daily. Taking this medicine more often than directed can cause serious side effects, even death. Talk to your healthcare provider about safe handling and disposal of this medicine. You may need to take special precautions. Talk to your pediatrician regarding the use of this medicine in children. While this drug may be prescribed for selected conditions, precautions do apply. Overdosage: If you think you have taken too much of this medicine contact a poison control center or emergency room at once. NOTE: This medicine is only for you. Do not share this medicine with others. What if I miss a dose? If you miss a dose, talk with your doctor or health care professional. Do not take double or extra doses. What may interact with this medicine? This medicine may interact with the following medication: -acitretin -aspirin and aspirin-like medicines including salicylates -azathioprine -certain antibiotics like penicillins, tetracycline, and chloramphenicol -cyclosporine -gold -hydroxychloroquine -live virus vaccines -NSAIDs, medicines for pain and inflammation, like ibuprofen or naproxen -other cytotoxic agents -penicillamine -phenylbutazone -phenytoin -probenecid -retinoids such as isotretinoin and tretinoin -steroid medicines like prednisone or cortisone -sulfonamides like sulfasalazine and trimethoprim/sulfamethoxazole -theophylline This list may not describe all possible interactions. Give your health care provider a list of all the medicines, herbs, non-prescription drugs, or dietary supplements you use. Also tell them if you smoke, drink alcohol, or use illegal drugs. Some items may interact with your medicine. What should I watch for while using this medicine? Avoid alcoholic drinks.  This medicine can make you more sensitive to the sun. Keep out of the sun. If you cannot avoid being in the sun,  wear protective clothing and use sunscreen. Do not use sun lamps or tanning beds/booths. You may need blood work done while you are taking this medicine. Call your doctor or health care professional for advice if you get a fever, chills or sore throat, or other symptoms of a cold or flu. Do not treat yourself. This drug decreases your body's ability to fight infections. Try to avoid being around people who are sick. This medicine may increase your risk to bruise or bleed. Call your doctor or health care professional if you notice any unusual bleeding. Check with your doctor or health care professional if you get an attack of severe diarrhea, nausea and vomiting, or if you sweat a lot. The loss of too much body fluid can make it dangerous for you to take this medicine. Talk to your doctor about your risk of cancer. You may be more at risk for certain types of cancers if you take this medicine. Both men and women must use effective birth control with this medicine. Do not become pregnant while taking this medicine or until at least 1 normal menstrual cycle has occurred after stopping it. Women should inform their doctor if they wish to become pregnant or think they might be pregnant. Men should not father a child while taking this medicine and for 3 months after stopping it. There is a potential for serious side effects to an unborn child. Talk to your health care professional or pharmacist for more information. Do not breast-feed an infant while taking this medicine. What side effects may I notice from receiving this medicine? Side effects that you should report to your doctor or health care professional as soon as possible: -allergic reactions like skin rash, itching or hives, swelling of the face, lips, or tongue -breathing problems or shortness of breath -diarrhea -dry, nonproductive cough -low blood counts - this medicine may decrease the number of white blood cells, red blood cells and platelets.  You may be at increased risk for infections and bleeding. -mouth sores -redness, blistering, peeling or loosening of the skin, including inside the mouth -signs of infection - fever or chills, cough, sore throat, pain or trouble passing urine -signs and symptoms of bleeding such as bloody or black, tarry stools; red or dark-brown urine; spitting up blood or brown material that looks like coffee grounds; red spots on the skin; unusual bruising or bleeding from the eye, gums, or nose -signs and symptoms of kidney injury like trouble passing urine or change in the amount of urine -signs and symptoms of liver injury like dark yellow or brown urine; general ill feeling or flu-like symptoms; light-colored stools; loss of appetite; nausea; right upper belly pain; unusually weak or tired; yellowing of the eyes or skin Side effects that usually do not require medical attention (report to your doctor or health care professional if they continue or are bothersome): -dizziness -hair loss -tiredness -upset stomach -vomiting This list may not describe all possible side effects. Call your doctor for medical advice about side effects. You may report side effects to FDA at 1-800-FDA-1088. Where should I keep my medicine? Keep out of the reach of children. Store at room temperature between 20 and 25 degrees C (68 and 77 degrees F). Protect from light. Throw away any unused medicine after the expiration date. NOTE: This sheet is a summary. It may  not cover all possible information. If you have questions about this medicine, talk to your doctor, pharmacist, or health care provider.  2018 Elsevier/Gold Standard (2014-10-23 05:39:22)

## 2017-03-20 NOTE — Progress Notes (Signed)
Ok to start on MTX 3 tabs po qwk  with Folic acid. Recheck labs in 2 weeks. She should avoid all NSAIDs.

## 2017-03-21 LAB — COMPLETE METABOLIC PANEL WITH GFR
AG Ratio: 1.8 (calc) (ref 1.0–2.5)
ALT: 13 U/L (ref 6–29)
AST: 15 U/L (ref 10–35)
Albumin: 3.9 g/dL (ref 3.6–5.1)
Alkaline phosphatase (APISO): 60 U/L (ref 33–130)
BUN/Creatinine Ratio: 28 (calc) — ABNORMAL HIGH (ref 6–22)
BUN: 28 mg/dL — ABNORMAL HIGH (ref 7–25)
CO2: 23 mmol/L (ref 20–32)
Calcium: 9 mg/dL (ref 8.6–10.4)
Chloride: 101 mmol/L (ref 98–110)
Creat: 0.99 mg/dL — ABNORMAL HIGH (ref 0.60–0.93)
GFR, Est African American: 65 mL/min/{1.73_m2} (ref >=60)
GFR, Est Non African American: 56 mL/min/{1.73_m2} — ABNORMAL LOW (ref >=60)
Globulin: 2.2 g/dL (calc) (ref 1.9–3.7)
Glucose, Bld: 76 mg/dL (ref 65–99)
Potassium: 4.5 mmol/L (ref 3.5–5.3)
Sodium: 133 mmol/L — ABNORMAL LOW (ref 135–146)
Total Bilirubin: 0.6 mg/dL (ref 0.2–1.2)
Total Protein: 6.1 g/dL (ref 6.1–8.1)

## 2017-03-21 LAB — IGG, IGA, IGM
IgG (Immunoglobin G), Serum: 919 mg/dL (ref 694–1618)
IgM, Serum: 67 mg/dL (ref 48–271)
Immunoglobulin A: 108 mg/dL (ref 81–463)

## 2017-03-21 LAB — HIV ANTIBODY (ROUTINE TESTING W REFLEX): HIV 1&2 Ab, 4th Generation: NONREACTIVE

## 2017-03-23 ENCOUNTER — Telehealth: Payer: Self-pay | Admitting: *Deleted

## 2017-03-23 MED ORDER — METHOTREXATE 2.5 MG PO TABS: 7.5000 mg | ORAL_TABLET | ORAL | 2 refills | Status: DC

## 2017-03-23 NOTE — Telephone Encounter (Signed)
-----   Message from Pollyann Savoy, MD sent at 03/20/2017  1:19 PM EST ----- Ok to start on MTX 3 tabs po qwk  with Folic acid. Recheck labs in 2 weeks. She should avoid all NSAIDs.

## 2017-04-09 ENCOUNTER — Other Ambulatory Visit: Payer: Self-pay

## 2017-04-09 DIAGNOSIS — Z79899 Other long term (current) drug therapy: Secondary | ICD-10-CM

## 2017-04-09 LAB — COMPLETE METABOLIC PANEL WITH GFR
AG Ratio: 1.7 (calc) (ref 1.0–2.5)
ALT: 12 U/L (ref 6–29)
AST: 16 U/L (ref 10–35)
Albumin: 4.1 g/dL (ref 3.6–5.1)
Alkaline phosphatase (APISO): 59 U/L (ref 33–130)
BUN: 15 mg/dL (ref 7–25)
CO2: 27 mmol/L (ref 20–32)
Calcium: 9.4 mg/dL (ref 8.6–10.4)
Chloride: 106 mmol/L (ref 98–110)
Creat: 0.75 mg/dL (ref 0.60–0.93)
GFR, Est African American: 91 mL/min/{1.73_m2} (ref >=60)
GFR, Est Non African American: 79 mL/min/{1.73_m2} (ref >=60)
Globulin: 2.4 g/dL (calc) (ref 1.9–3.7)
Glucose, Bld: 82 mg/dL (ref 65–99)
Potassium: 4.7 mmol/L (ref 3.5–5.3)
Sodium: 140 mmol/L (ref 135–146)
Total Bilirubin: 0.8 mg/dL (ref 0.2–1.2)
Total Protein: 6.5 g/dL (ref 6.1–8.1)

## 2017-04-09 LAB — CBC WITH DIFFERENTIAL/PLATELET
Basophils Absolute: 32 cells/uL (ref 0–200)
Basophils Relative: 0.7 %
Eosinophils Absolute: 41 cells/uL (ref 15–500)
Eosinophils Relative: 0.9 %
HCT: 35.8 % (ref 35.0–45.0)
Hemoglobin: 11.8 g/dL (ref 11.7–15.5)
Lymphs Abs: 1817 cells/uL (ref 850–3900)
MCH: 29.3 pg (ref 27.0–33.0)
MCHC: 33 g/dL (ref 32.0–36.0)
MCV: 88.8 fL (ref 80.0–100.0)
MPV: 9.9 fL (ref 7.5–12.5)
Monocytes Relative: 12.6 %
Neutro Abs: 2130 cells/uL (ref 1500–7800)
Neutrophils Relative %: 46.3 %
Platelets: 240 10*3/uL (ref 140–400)
RBC: 4.03 10*6/uL (ref 3.80–5.10)
RDW: 13.3 % (ref 11.0–15.0)
Total Lymphocyte: 39.5 %
WBC mixed population: 580 cells/uL (ref 200–950)
WBC: 4.6 10*3/uL (ref 3.8–10.8)

## 2017-04-21 NOTE — H&P (Signed)
Michele Bryan is an 77 y.o. female.    Chief Complaint: left shoulder pain  HPI: Pt is a 77 y.o. female complaining of left shoulder pain for multiple years. Pain had continually increased since the beginning. X-rays in the clinic show end-stage arthritic changes of the left shoulder. Pt has tried various conservative treatments which have failed to alleviate their symptoms, including injections and therapy. Various options are discussed with the patient. Risks, benefits and expectations were discussed with the patient. Patient understand the risks, benefits and expectations and wishes to proceed with surgery.   PCP:  Sigmund Hazel, MD  D/C Plans: Home  PMH: Past Medical History:  Diagnosis Date  . Anemia   . Arthritis   . GERD (gastroesophageal reflux disease)   . History of blood transfusion   . Hyperlipidemia   . MS (multiple sclerosis) (HCC)   . Neuromuscular disorder (HCC)    multiple scleroosis/peripheral neuropathy  . PONV (postoperative nausea and vomiting)   . Rheumatoid arthritis (HCC)   . Ulcer   . Vitamin D deficiency     PSH: Past Surgical History:  Procedure Laterality Date  . breast augumentation    . BREAST SURGERY     biopsy  . KNEE ARTHROSCOPY Right   . THYROID LOBECTOMY    . TOTAL KNEE ARTHROPLASTY Right 06/15/2012   Procedure: RIGHT TOTAL KNEE ARTHROPLASTY;  Surgeon: [name], MD;  Location: [facility];  Service: Orthopedics;  Laterality: Right;  . WRIST SURGERY Left     Social History:  reports that  has never smoked. she has never used smokeless tobacco. She reports that she does not drink alcohol or use drugs.  Allergies:  Allergies  Allergen Reactions  . Codeine Nausea And Vomiting  . Demerol [Meperidine] Nausea And Vomiting    Medications: No current facility-administered medications for this encounter.    Current Outpatient Medications  Medication Sig Dispense Refill  . alendronate (FOSAMAX) 70 MG tablet once a week.    .  cholecalciferol (VITAMIN D) 1000 units tablet Take 5,000 Units by mouth daily.    . diclofenac sodium (VOLTAREN) 1 % GEL Apply 2 g topically 4 (four) times daily. (Patient not taking: Reported on 03/19/2017) 3 Tube 1  . hydroxychloroquine (PLAQUENIL) 200 MG tablet Take 1 tablet by mouth twice daily Monday-Friday. 120 tablet 0  . hydroxychloroquine (PLAQUENIL) 200 MG tablet take 1 tablet by mouth twice daily Monday thru Friday (Patient not taking: Reported on 03/19/2017) 120 tablet 0  . IRON, FERROUS GLUCONATE, PO Take 65 mg by mouth daily.    Marland Kitchen KRILL OIL PO Take 500 mg by mouth daily.    Marland Kitchen L-Lysine 500 MG TABS Take 1 tablet by mouth 2 (two) times daily.    . Methenamine-Sodium Salicylate (CYSTEX PO) Take by mouth 2 (two) times daily.     . methotrexate (RHEUMATREX) 2.5 MG tablet Take 3 tablets (7.5 mg total) by mouth once a week. Caution:Chemotherapy. Protect from light. 12 tablet 2  . Multiple Vitamin (MULTIVITAMIN WITH MINERALS) TABS Take 1 tablet by mouth daily.    Marland Kitchen omeprazole (PRILOSEC) 40 MG capsule Take 40 mg by mouth daily.     . vitamin B-12 (CYANOCOBALAMIN) 500 MCG tablet Take 500 mcg by mouth daily.    . vitamin E 400 UNIT capsule Take 400 Units by mouth daily.    Marland Kitchen zolpidem (AMBIEN) 10 MG tablet Take 5 mg by mouth at bedtime as needed for sleep.  No results found for this or any previous visit (from the past 48 hour(s)). No results found.  ROS: Pain with rom of the left upper extremity  Physical Exam:  Alert and oriented 77 y.o. female in no acute distress Cranial nerves 2-12 intact Cervical spine: full rom with no tenderness, nv intact distally Chest: active breath sounds bilaterally, no wheeze rhonchi or rales Heart: regular rate and rhythm, no murmur Abd: non tender non distended with active bowel sounds Hip is stable with rom  Left shoulder with limited rom and strength due to arthropathy nv intact distally No rashes or edema  distally  Assessment/Plan Assessment: left shoulder cuff arthropathy  Plan: Patient will undergo a left reverse total shoulder by Dr. Ranell Patrick at  [facility]. Risks benefits and expectations were discussed with the patient. Patient understand risks, benefits and expectations and wishes to proceed.  Alphonsa Overall PA-C, MPAS [facility] is now [employer] [address], [place], [place] [address] Phone: [phone] [email or web address] Facebook  [employer]

## 2017-04-30 NOTE — Progress Notes (Signed)
Office Visit Note  Patient: Michele Bryan             Date of Birth: 26-Jun-1940           MRN: [id number]             PCP: Sigmund Hazel, MD Referring: Sigmund Hazel, MD Visit Date: 05/13/2017 Occupation: @GUAROCC @    Subjective:  Left shoulder pain   History of Present Illness: Michele Bryan is a 77 y.o. female with history of seropositive rheumatoid arthritis, osteoarthritis, and osteopenia.  Patient is no longer taking Plaquenil.  She was taking methotrexate 3 tablets weekly and folic acid 1 mg daily her last dose of methotrexate was last Friday.  She states she was experiencing fatigue and diarrhea when she was on MTX for about 1 month. She did not notice any improvement while being on MTX. She is having rotator cuff repair surgery with Dr. Tuesday this coming Friday.  She continues to take Fosamax 70 mg weekly.  Patient states she is having significant left shoulder pain.  She has very limited range of motion and the pain has been keeping her up at night.  She has been taking tramadol on a regular basis due to her pain.  The tramadol has been causing constipation.  Patient states she has signfiicant pain in bilateral hands, worse in the left.  She also has swelling in bilateral hands. She states her feet and knees have been doing ok.  She denies any swelling in her feet or knees.    Activities of Daily Living:  Patient reports morning stiffness for  all day.   Patient Reports nocturnal pain.  Difficulty dressing/grooming: Denies Difficulty climbing stairs: Denies Difficulty getting out of chair: Reports Difficulty using hands for taps, buttons, cutlery, and/or writing: Reports   Review of Systems  Constitutional: Positive for fatigue. Negative for weakness.  HENT: Negative for mouth sores, trouble swallowing, trouble swallowing, mouth dryness and nose dryness.   Eyes: Negative for pain, redness, visual disturbance and dryness.  Respiratory: Negative for cough, hemoptysis,  shortness of breath and difficulty breathing.   Cardiovascular: Negative for chest pain, palpitations, hypertension, irregular heartbeat and swelling in legs/feet.  Gastrointestinal: Positive for constipation. Negative for blood in stool and diarrhea.  Endocrine: Negative for increased urination.  Genitourinary: Negative for painful urination.  Musculoskeletal: Positive for arthralgias, joint pain, joint swelling, myalgias, morning stiffness and myalgias. Negative for muscle weakness and muscle tenderness.  Skin: Positive for nodules/bumps. Negative for color change, rash, hair loss, redness, skin tightness, ulcers and sensitivity to sunlight.  Allergic/Immunologic: Negative for susceptible to infections.  Neurological: Negative for dizziness, numbness and headaches.  Hematological: Negative for swollen glands.  Psychiatric/Behavioral: Positive for sleep disturbance. Negative for depressed mood. The patient is not nervous/anxious.     PMFS History:  Patient Active Problem List   Diagnosis Date Noted  . 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 High risk medication use 05/06/2016  . Primary osteoarthritis of both hands 05/06/2016  . Primary osteoarthritis of left knee 05/06/2016  . Primary osteoarthritis of both feet 05/06/2016  . Dyslipidemia 05/06/2016  . Peripheral neuropathy 05/06/2016  . Diverticulosis of intestine without bleeding 05/06/2016  . Osteopenia  05/06/2016  . Vitamin D deficiency 05/06/2016  . 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 History of GI bleed 05/06/2016  . Cataract of both eyes 05/06/2016  . Urinary urgency 04/30/2015  . Chronic constipation 04/30/2015  .  Multiple sclerosis (HCC) 03/23/2013  . Expected blood loss anemia 06/16/2012  . 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 Anemia   . Arthritis   . GERD (gastroesophageal reflux disease)   . History  of blood transfusion   . Hyperlipidemia   . MS (multiple sclerosis) (HCC)   . Neuromuscular disorder (HCC)    multiple scleroosis/peripheral neuropathy  . PONV (postoperative nausea and vomiting)   . Rheumatoid arthritis (HCC)   . Ulcer   . Vitamin D deficiency     Family History  Problem Relation Age of Onset  . Heart attack Father    Past Surgical History:  Procedure Laterality Date  . breast augumentation    . BREAST SURGERY     biopsy  . COLONOSCOPY    . EYE SURGERY     both eys  . KNEE ARTHROSCOPY Right   . THYROID LOBECTOMY    . TOTAL KNEE ARTHROPLASTY Right 06/15/2012   Procedure: RIGHT TOTAL KNEE ARTHROPLASTY;  Surgeon: [name], MD;  Location: [facility];  Service: Orthopedics;  Laterality: Right;  . WRIST SURGERY Left    Social History   Social History Narrative   Patient lives at home with her husband [name]).   Retired.   Education- college.   Caffeine- Two cups of decaf tea daily.   Right handed.           Objective: Vital Signs: BP (!) 147/90 (BP Location: Right Arm, Patient Position: Sitting, Cuff Size: Normal)   Pulse 66   Resp 15   Ht 5\' 4"  (1.626 m)   Wt 121 lb (54.9 kg)   BMI 20.77 kg/m    Physical Exam  Constitutional: She is oriented to person, place, and time. She appears well-developed and well-nourished.  HENT:  Head: Normocephalic and atraumatic.  Eyes: Conjunctivae and EOM are normal.  Neck: Normal range of motion.  Cardiovascular: Normal rate, regular rhythm, normal heart sounds and intact distal pulses.  Pulmonary/Chest: Effort normal and breath sounds normal.  Abdominal: Soft. Bowel sounds are normal.  Lymphadenopathy:    She has no cervical adenopathy.  Neurological: She is alert and oriented to person, place, and time.  Skin: Skin is warm and dry. Capillary refill takes less than 2 seconds.  Psychiatric: She has a normal mood and affect. Her behavior is normal.  Nursing note and vitals reviewed.    Musculoskeletal Exam:  C-spine limited ROM with lateral rotation.  Thoracic kyphosis noted.  Lumbar spine limited ROM.  She has midline spinal tenderness in the thoracic and lumbar spine.  Right shoulder good ROM.  Left shoulder significant pain with extreme limitation with active ROM.  She has synovitis of MCPs and PIPs.  DIP synovial thickening.  Ulnar deviation bilaterally.  Mucin cyst present on left 1st PIP joint.  Good ROM of bilateral knees.  Mild warmth of left knee. Ankle joints good ROM with no synovitis.  No tenderness of MTPs.  She has limited ROM of all of her toes.    CDAI Exam: CDAI Homunculus Exam:   Tenderness:  LUE: glenohumeral Right hand: 1st MCP, 2nd MCP, 3rd MCP, 4th MCP, 5th MCP, 2nd PIP, 3rd PIP, 4th PIP and 5th PIP Left hand: 1st MCP, 2nd MCP, 3rd MCP, 4th MCP, 5th MCP, 2nd PIP, 3rd PIP, 4th PIP and 5th PIP  Swelling:  Right hand: 2nd MCP, 3rd MCP, 4th MCP, 2nd PIP and 3rd PIP Left hand: 2nd MCP, 3rd MCP,  4th MCP, 2nd PIP and 3rd PIP  Joint Counts:  CDAI Tender Joint count: 19 CDAI Swollen Joint count: 10  Global Assessments:  Patient Global Assessment: 6 Provider Global Assessment: 6  CDAI Calculated Score: 41    Investigation: No additional findings. CBC Latest Ref Rng & Units 05/07/2017 04/09/2017 10/09/2016  WBC 4.0 - 10.5 K/uL 7.8 4.6 6.2  Hemoglobin 12.0 - 15.0 g/dL 19.7 58.8 32.5  Hematocrit 36.0 - 46.0 % 38.9 35.8 39.8  Platelets 150 - 400 K/uL 221 240 212   CMP Latest Ref Rng & Units 05/07/2017 04/09/2017 03/19/2017  Glucose 65 - 99 mg/dL 93 82 76  BUN 6 - 20 mg/dL 15 15 49(I)  Creatinine 0.44 - 1.00 mg/dL 2.64 1.58 3.09(M)  Sodium 135 - 145 mmol/L 138 140 133(L)  Potassium 3.5 - 5.1 mmol/L 4.4 4.7 4.5  Chloride 101 - 111 mmol/L 104 106 101  CO2 22 - 32 mmol/L 24 27 23   Calcium 8.9 - 10.3 mg/dL 9.4 9.4 9.0  Total Protein 6.1 - 8.1 g/dL - 6.5 6.1  Total Bilirubin 0.2 - 1.2 mg/dL - 0.8 0.6  Alkaline Phos 33 - 130 U/L - - -  AST 10 - 35 U/L - 16 15  ALT 6 - 29 U/L - 12 13      Imaging: No results found.  Speciality Comments: No specialty comments available.    Procedures:  No procedures performed Allergies: Codeine and Demerol [meperidine]   Assessment / Plan:     Visit Diagnoses: 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: She continues to have active synovitis of MCPs and PIPs on exam.  She was taking MTX 3 tablets weekly for about 1 month and did not notice any improvement.  She is having left rotator cuff repair this Friday by Dr. Ranell Patrick.  She is holding her dose of MTX 1 week prior to surgery and was advised to be cleared by Dr. Ranell Patrick prior to restarting MTX.  She will return in 3 weeks to discuss restarting her medications.  We will discuss restarting MTX 4 tablets weekly (increase to 6 tablets if labs are stable 2 weeks after starting 4 tablets weekly) and PLQ 200 mg BID M-F.  She will be due for her PLQ eye exam in April 2019.   High risk medication use - MTX, folic acid, PLQ 200 mg BID M-F. She has not been taking MTX or PLQ due to upcoming surgery on Friday. Once she resumes MTX and PLQ we will check CBC and CMP to monitor for drug toxicity.    Primary osteoarthritis of both hands: She has PIP and DIP synovial thickening.  She has synovitis in her PIP joints.    Primary osteoarthritis of left knee: She has mild warmth.  Good ROM on exam.   Primary osteoarthritis of both feet: She has no tenderness or synovitis. She has PIP and DIP synovial thickening consistent with osteoarthritis of her feet. We discussed the importance of wearing proper fitting shoes.    Tear of left rotator cuff, unspecified tear extent - She is having left rotator cuff repair on Friday by Dr. Ranell Patrick.  She has significant pain and limited ROM of her left shoulder.  She has been holding her dose of MTX  For 1 week prior to surgery.  She was advised to be cleared by Dr. Ranell Patrick before resuming MTX.        Status post right knee  replacement: Doing well.  Good ROM.  No warmth or effusion.    Osteopenia of multiple sites -  Patient reports her bone densities have been stable. She is on Fosamax 70 mg weekly per her PCP.  Postural kyphosis of thoracic region: She has midline spinal tenderness in the thoracic and lumbar region.   Other medical conditions are listed as follows:   Personal history of immunosuppressive therapy  Dyslipidemia  History of peripheral neuropathy  History of cataract  History of multiple sclerosis  History of GI bleed  History of vitamin D deficiency - She is on vitamin D supplement.  History of diverticulosis    Orders: No orders of the defined types were placed in this encounter.  No orders of the defined types were placed in this encounter.   Face-to-face time spent with patient was 30 minutes. >50% of time was spent in counseling and coordination of care.  Follow-Up Instructions: Return for Rheumatoid arthritis, Osteoarthritis.   Gearldine Bienenstock, PA-C   I examined and evaluated the patient with Sherron Ales PA.  Patient is having a flare as she has been off her medications.  She had active synovitis in multiple joints today on my exam.  She has been advised to contact us after her surgery so her medications could be resumed.  The plan of care was discussed as noted above.  Pollyann Savoy, MD  Note - This record has been created using Animal nutritionist.  Chart creation errors have been sought, but may not always  have been located. Such creation errors do not reflect on  the standard of medical care.

## 2017-05-06 NOTE — Pre-Procedure Instructions (Signed)
Michele Bryan  05/06/2017      [facility] # [id number] - [address], [place] - [address] [place] [address] Phone: [phone] Fax: [phone]    Your procedure is scheduled on  Friday  05/15/17  Report to [facility] Admitting at 800 A.M.  Call this number if you have problems the morning of surgery:  [phone]   Remember:  Do not eat food or drink liquids after midnight.  Take these medicines the morning of surgery with A SIP OF WATER - omeprazole (prilosec), tramadol  7 days prior to surgery STOP taking any Aspirin(unless otherwise instructed by your surgeon), Aleve, Naproxen, Ibuprofen, Motrin, Advil, Goody's, BC's, all herbal medications, fish oil/ krill oil , and all vitamins, hemp oil, vit E    Do not wear jewelry, make-up or nail polish.  Do not wear lotions, powders, or perfumes, or deodorant.  Do not shave 48 hours prior to surgery.  Men may shave face and neck.  Do not bring valuables to the hospital.  [facility] is not responsible for any belongings or valuables.  Contacts, dentures or bridgework may not be worn into surgery.  Leave your suitcase in the car.  After surgery it may be brought to your room.  For patients admitted to the hospital, discharge time will be determined by your treatment team.  Patients discharged the day of surgery will not be allowed to drive home.   Name and phone number of your driver:     Special instructions: [facility] - Preparing for Surgery  Before surgery, you can play an important role.  Because skin is not sterile, your skin needs to be as free of germs as possible.  You can reduce the number of germs on you skin by washing with CHG (chlorahexidine gluconate) soap before surgery.  CHG is an antiseptic cleaner which kills germs and bonds with the skin to continue killing germs even after washing.  Please DO NOT use if you have an allergy to CHG or antibacterial soaps.  If your  skin becomes reddened/irritated stop using the CHG and inform your nurse when you arrive at Short Stay.  Do not shave (including legs and underarms) for at least 48 hours prior to the first CHG shower.  You may shave your face.  Please follow these instructions carefully:   1.  Shower with CHG Soap the night before surgery and the                                morning of Surgery.  2.  If you choose to wash your hair, wash your hair first as usual with your       normal shampoo.  3.  After you shampoo, rinse your hair and body thoroughly to remove the                      Shampoo.  4.  Use CHG as you would any other liquid soap.  You can apply chg directly       to the skin and wash gently with scrungie or a clean washcloth.  5.  Apply the CHG Soap to your body ONLY FROM THE NECK DOWN.        Do not use on open wounds or open sores.  Avoid contact with your eyes,       ears, mouth  and genitals (private parts).  Wash genitals (private parts)       with your normal soap.  6.  Wash thoroughly, paying special attention to the area where your surgery        will be performed.  7.  Thoroughly rinse your body with warm water from the neck down.  8.  DO NOT shower/wash with your normal soap after using and rinsing off       the CHG Soap.  9.  Pat yourself dry with a clean towel.            10.  Wear clean pajamas.            11.  Place clean sheets on your bed the night of your first shower and do not        sleep with pets.  Day of Surgery  Do not apply any lotions/deoderants the morning of surgery.  Please wear clean clothes to the hospital/surgery center.    Please read over the following fact sheets that you were given. MRSA Information and Surgical Site Infection Prevention

## 2017-05-07 ENCOUNTER — Encounter (HOSPITAL_COMMUNITY): Payer: Self-pay

## 2017-05-07 ENCOUNTER — Other Ambulatory Visit: Payer: Self-pay

## 2017-05-07 ENCOUNTER — Encounter (HOSPITAL_COMMUNITY)
Admission: RE | Admit: 2017-05-07 | Discharge: 2017-05-07 | Disposition: A | Discharge status: Home / Self Care | Payer: Medicare Other | Source: Ambulatory Visit | Attending: Orthopedic Surgery | Admitting: Orthopedic Surgery

## 2017-05-07 DIAGNOSIS — Z01812 Encounter for preprocedural laboratory examination: Secondary | ICD-10-CM | POA: Diagnosis not present

## 2017-05-07 LAB — BASIC METABOLIC PANEL
Anion gap: 10 (ref 5–15)
BUN: 15 mg/dL (ref 6–20)
CO2: 24 mmol/L (ref 22–32)
Calcium: 9.4 mg/dL (ref 8.9–10.3)
Chloride: 104 mmol/L (ref 101–111)
Creatinine, Ser: 0.8 mg/dL (ref 0.44–1.00)
GFR calc Af Amer: >60 mL/min (ref >60)
GFR calc non Af Amer: >60 mL/min (ref >60)
Glucose, Bld: 93 mg/dL (ref 65–99)
Potassium: 4.4 mmol/L (ref 3.5–5.1)
Sodium: 138 mmol/L (ref 135–145)

## 2017-05-07 LAB — CBC
HCT: 38.9 % (ref 36.0–46.0)
Hemoglobin: 12.4 g/dL (ref 12.0–15.0)
MCH: 29 pg (ref 26.0–34.0)
MCHC: 31.9 g/dL (ref 30.0–36.0)
MCV: 91.1 fL (ref 78.0–100.0)
Platelets: 221 10*3/uL (ref 150–400)
RBC: 4.27 MIL/uL (ref 3.87–5.11)
RDW: 14.5 % (ref 11.5–15.5)
WBC: 7.8 10*3/uL (ref 4.0–10.5)

## 2017-05-07 LAB — SURGICAL PCR SCREEN
MRSA, PCR: NEGATIVE
Staphylococcus aureus: NEGATIVE

## 2017-05-07 NOTE — Progress Notes (Signed)
PCP is Dr. Sigmund Hazel Denies ever seeing a cardiologist. Denies chest pain, cough, or fever. Denies ever having a card cath, stress test, or echo.

## 2017-05-13 ENCOUNTER — Encounter: Payer: Self-pay | Admitting: Rheumatology

## 2017-05-13 ENCOUNTER — Ambulatory Visit: Payer: Medicare Other | Admitting: Rheumatology

## 2017-05-13 VITALS — BP 147/90 | HR 66 | Resp 15 | Ht 64.0 in | Wt 121.0 lb

## 2017-05-13 DIAGNOSIS — M19072 Primary osteoarthritis, left ankle and foot: Secondary | ICD-10-CM

## 2017-05-13 DIAGNOSIS — G35 Multiple sclerosis: Secondary | ICD-10-CM

## 2017-05-13 DIAGNOSIS — M0579 Rheumatoid arthritis with rheumatoid factor of multiple sites without organ or systems involvement: Secondary | ICD-10-CM | POA: Diagnosis not present

## 2017-05-13 DIAGNOSIS — Z8719 Personal history of other diseases of the digestive system: Secondary | ICD-10-CM

## 2017-05-13 DIAGNOSIS — M75102 Unspecified rotator cuff tear or rupture of left shoulder, not specified as traumatic: Secondary | ICD-10-CM

## 2017-05-13 DIAGNOSIS — M19042 Primary osteoarthritis, left hand: Secondary | ICD-10-CM

## 2017-05-13 DIAGNOSIS — M19071 Primary osteoarthritis, right ankle and foot: Secondary | ICD-10-CM

## 2017-05-13 DIAGNOSIS — M8589 Other specified disorders of bone density and structure, multiple sites: Secondary | ICD-10-CM

## 2017-05-13 DIAGNOSIS — Z96651 Presence of right artificial knee joint: Secondary | ICD-10-CM | POA: Diagnosis not present

## 2017-05-13 DIAGNOSIS — M4004 Postural kyphosis, thoracic region: Secondary | ICD-10-CM

## 2017-05-13 DIAGNOSIS — M19041 Primary osteoarthritis, right hand: Secondary | ICD-10-CM | POA: Diagnosis not present

## 2017-05-13 DIAGNOSIS — Z8669 Personal history of other diseases of the nervous system and sense organs: Secondary | ICD-10-CM | POA: Diagnosis not present

## 2017-05-13 DIAGNOSIS — E785 Hyperlipidemia, unspecified: Secondary | ICD-10-CM

## 2017-05-13 DIAGNOSIS — Z9225 Personal history of immunosupression therapy: Secondary | ICD-10-CM

## 2017-05-13 DIAGNOSIS — Z79899 Other long term (current) drug therapy: Secondary | ICD-10-CM | POA: Diagnosis not present

## 2017-05-13 DIAGNOSIS — M1712 Unilateral primary osteoarthritis, left knee: Secondary | ICD-10-CM | POA: Diagnosis not present

## 2017-05-13 DIAGNOSIS — Z8639 Personal history of other endocrine, nutritional and metabolic disease: Secondary | ICD-10-CM

## 2017-05-13 NOTE — Patient Instructions (Signed)
Standing Labs We placed an order today for your standing lab work.    Please come back and get your standing labs in June and every 3 months  We have open lab Monday through Friday from 8:30-11:30 AM and 1:30-4 PM at the office of Dr. Shaili Deveshwar.   The office is located at [address], [address], [place], [place] [address] No appointment is necessary.   Labs are drawn by [facility].  You may receive a bill from [facility] for your lab work. If you have any questions regarding directions or hours of operation,  please call [phone].    

## 2017-05-14 MED ORDER — CEFAZOLIN SODIUM-DEXTROSE 2-4 GM/100ML-% IV SOLN
2.0000 g | INTRAVENOUS | Status: AC
  Administered 2017-05-15: 2 g via INTRAVENOUS
  Filled 2017-05-14: qty 100

## 2017-05-15 ENCOUNTER — Inpatient Hospital Stay (HOSPITAL_COMMUNITY): Payer: Medicare Other | Admitting: Anesthesiology

## 2017-05-15 ENCOUNTER — Inpatient Hospital Stay (HOSPITAL_COMMUNITY): Payer: Medicare Other

## 2017-05-15 ENCOUNTER — Encounter (HOSPITAL_COMMUNITY): Payer: Self-pay | Admitting: *Deleted

## 2017-05-15 ENCOUNTER — Encounter (HOSPITAL_COMMUNITY)
Admission: RE | Disposition: A | Discharge status: Home / Self Care | Payer: Self-pay | Source: Ambulatory Visit | Attending: Orthopedic Surgery

## 2017-05-15 ENCOUNTER — Inpatient Hospital Stay (HOSPITAL_COMMUNITY)
Admission: RE | Admit: 2017-05-15 | Discharge: 2017-05-16 | DRG: 483 | Disposition: A | Discharge status: Home / Self Care | Payer: Medicare Other | Source: Ambulatory Visit | Attending: Orthopedic Surgery | Admitting: Orthopedic Surgery

## 2017-05-15 DIAGNOSIS — E785 Hyperlipidemia, unspecified: Secondary | ICD-10-CM | POA: Diagnosis present

## 2017-05-15 DIAGNOSIS — K219 Gastro-esophageal reflux disease without esophagitis: Secondary | ICD-10-CM | POA: Diagnosis present

## 2017-05-15 DIAGNOSIS — D62 Acute posthemorrhagic anemia: Secondary | ICD-10-CM | POA: Diagnosis not present

## 2017-05-15 DIAGNOSIS — Z96612 Presence of left artificial shoulder joint: Secondary | ICD-10-CM

## 2017-05-15 DIAGNOSIS — G35 Multiple sclerosis: Secondary | ICD-10-CM | POA: Diagnosis present

## 2017-05-15 DIAGNOSIS — M75102 Unspecified rotator cuff tear or rupture of left shoulder, not specified as traumatic: Secondary | ICD-10-CM | POA: Diagnosis present

## 2017-05-15 DIAGNOSIS — M069 Rheumatoid arthritis, unspecified: Secondary | ICD-10-CM | POA: Diagnosis present

## 2017-05-15 DIAGNOSIS — M19012 Primary osteoarthritis, left shoulder: Secondary | ICD-10-CM | POA: Diagnosis present

## 2017-05-15 DIAGNOSIS — Z96651 Presence of right artificial knee joint: Secondary | ICD-10-CM | POA: Diagnosis present

## 2017-05-15 DIAGNOSIS — M25512 Pain in left shoulder: Secondary | ICD-10-CM | POA: Diagnosis present

## 2017-05-15 DIAGNOSIS — M65812 Other synovitis and tenosynovitis, left shoulder: Secondary | ICD-10-CM | POA: Diagnosis present

## 2017-05-15 HISTORY — PX: REVERSE SHOULDER ARTHROPLASTY: SHX5054

## 2017-05-15 SURGERY — ARTHROPLASTY, SHOULDER, TOTAL, REVERSE
Anesthesia: General | Site: Shoulder | Laterality: Left

## 2017-05-15 MED ORDER — 0.9 % SODIUM CHLORIDE (POUR BTL) OPTIME
TOPICAL | Status: DC | PRN
  Administered 2017-05-15: 1000 mL

## 2017-05-15 MED ORDER — METOCLOPRAMIDE HCL 5 MG/ML IJ SOLN
INTRAMUSCULAR | Status: AC
  Filled 2017-05-15: qty 2

## 2017-05-15 MED ORDER — SUGAMMADEX SODIUM 200 MG/2ML IV SOLN
INTRAVENOUS | Status: AC
  Filled 2017-05-15: qty 2

## 2017-05-15 MED ORDER — VITAMIN E 180 MG (400 UNIT) PO CAPS
400.0000 [IU] | ORAL_CAPSULE | Freq: Every day | ORAL | Status: DC
  Filled 2017-05-15 (×2): qty 1

## 2017-05-15 MED ORDER — FENTANYL CITRATE (PF) 250 MCG/5ML IJ SOLN
INTRAMUSCULAR | Status: AC
Start: 2017-05-15 — End: ?
  Filled 2017-05-15: qty 5

## 2017-05-15 MED ORDER — MAGNESIUM OXIDE 400 (241.3 MG) MG PO TABS
200.0000 mg | ORAL_TABLET | Freq: Every day | ORAL | Status: DC
  Filled 2017-05-15: qty 0.5
  Filled 2017-05-15: qty 1

## 2017-05-15 MED ORDER — DEXAMETHASONE SODIUM PHOSPHATE 10 MG/ML IJ SOLN
INTRAMUSCULAR | Status: DC | PRN
  Administered 2017-05-15: 5 mg via INTRAVENOUS

## 2017-05-15 MED ORDER — PROPOFOL 10 MG/ML IV BOLUS
INTRAVENOUS | Status: AC
  Filled 2017-05-15: qty 20

## 2017-05-15 MED ORDER — ONDANSETRON HCL 4 MG/2ML IJ SOLN
INTRAMUSCULAR | Status: DC | PRN
  Administered 2017-05-15: 4 mg via INTRAVENOUS

## 2017-05-15 MED ORDER — VITAMIN A 8000 UNITS PO CAPS
8000.0000 [IU] | ORAL_CAPSULE | Freq: Every day | ORAL | Status: DC
  Filled 2017-05-15: qty 1

## 2017-05-15 MED ORDER — HYDROCODONE-ACETAMINOPHEN 5-325 MG PO TABS
1.0000 | ORAL_TABLET | ORAL | Status: DC | PRN
  Administered 2017-05-16: 1 via ORAL
  Filled 2017-05-15: qty 1

## 2017-05-15 MED ORDER — MIDAZOLAM HCL 2 MG/2ML IJ SOLN
INTRAMUSCULAR | Status: AC
Start: 2017-05-15 — End: 2017-05-15
  Administered 2017-05-15: 1 mg
  Filled 2017-05-15: qty 2

## 2017-05-15 MED ORDER — METHOCARBAMOL 500 MG PO TABS
500.0000 mg | ORAL_TABLET | Freq: Four times a day (QID) | ORAL | Status: DC | PRN
  Administered 2017-05-15 – 2017-05-16 (×2): 500 mg via ORAL
  Filled 2017-05-15 (×2): qty 1

## 2017-05-15 MED ORDER — METOCLOPRAMIDE HCL 5 MG/ML IJ SOLN
5.0000 mg | Freq: Three times a day (TID) | INTRAMUSCULAR | Status: DC | PRN
  Administered 2017-05-15: 10 mg via INTRAVENOUS

## 2017-05-15 MED ORDER — ACETAMINOPHEN ER 650 MG PO TBCR
650.0000 mg | EXTENDED_RELEASE_TABLET | Freq: Three times a day (TID) | ORAL | Status: DC | PRN
Start: 2017-05-15 — End: 2017-05-15

## 2017-05-15 MED ORDER — BISACODYL 10 MG RE SUPP: 10.0000 mg | Freq: Every day | RECTAL | Status: DC | PRN

## 2017-05-15 MED ORDER — MENTHOL 3 MG MT LOZG: 1.0000 | LOZENGE | OROMUCOSAL | Status: DC | PRN

## 2017-05-15 MED ORDER — KRILL OIL 500 MG PO CAPS: 500.0000 mg | ORAL_CAPSULE | Freq: Every day | ORAL | Status: DC

## 2017-05-15 MED ORDER — ONDANSETRON HCL 4 MG/2ML IJ SOLN
4.0000 mg | Freq: Four times a day (QID) | INTRAMUSCULAR | Status: DC | PRN
  Administered 2017-05-15: 4 mg via INTRAVENOUS
  Filled 2017-05-15: qty 2

## 2017-05-15 MED ORDER — FENTANYL CITRATE (PF) 100 MCG/2ML IJ SOLN
INTRAMUSCULAR | Status: DC | PRN
  Administered 2017-05-15: 50 ug via INTRAVENOUS

## 2017-05-15 MED ORDER — CEFAZOLIN SODIUM-DEXTROSE 2-4 GM/100ML-% IV SOLN
2.0000 g | Freq: Four times a day (QID) | INTRAVENOUS | Status: AC
  Administered 2017-05-15 – 2017-05-16 (×3): 2 g via INTRAVENOUS
  Filled 2017-05-15 (×3): qty 100

## 2017-05-15 MED ORDER — ADULT MULTIVITAMIN W/MINERALS CH: 1.0000 | ORAL_TABLET | Freq: Every day | ORAL | Status: DC

## 2017-05-15 MED ORDER — ROCURONIUM BROMIDE 100 MG/10ML IV SOLN
INTRAVENOUS | Status: DC | PRN
  Administered 2017-05-15: 30 mg via INTRAVENOUS

## 2017-05-15 MED ORDER — PHENOL 1.4 % MT LIQD: 1.0000 | OROMUCOSAL | Status: DC | PRN

## 2017-05-15 MED ORDER — BUPIVACAINE-EPINEPHRINE 0.25% -1:200000 IJ SOLN
INTRAMUSCULAR | Status: DC | PRN
  Administered 2017-05-15: 8.5 mL

## 2017-05-15 MED ORDER — METHOCARBAMOL 1000 MG/10ML IJ SOLN
500.0000 mg | Freq: Four times a day (QID) | INTRAVENOUS | Status: DC | PRN
  Filled 2017-05-15: qty 5

## 2017-05-15 MED ORDER — LIDOCAINE HCL (CARDIAC) 20 MG/ML IV SOLN
INTRAVENOUS | Status: AC
  Filled 2017-05-15: qty 5

## 2017-05-15 MED ORDER — ACETAMINOPHEN 325 MG PO TABS
325.0000 mg | ORAL_TABLET | Freq: Four times a day (QID) | ORAL | Status: DC | PRN
  Administered 2017-05-16: 650 mg via ORAL
  Filled 2017-05-15: qty 2

## 2017-05-15 MED ORDER — HYDROCODONE-ACETAMINOPHEN 7.5-325 MG PO TABS
1.0000 | ORAL_TABLET | ORAL | Status: DC | PRN
  Administered 2017-05-16: 1 via ORAL
  Filled 2017-05-15: qty 1

## 2017-05-15 MED ORDER — FOLIC ACID 1 MG PO TABS: 1.0000 mg | ORAL_TABLET | Freq: Every day | ORAL | Status: DC

## 2017-05-15 MED ORDER — BUPIVACAINE-EPINEPHRINE (PF) 0.25% -1:200000 IJ SOLN
INTRAMUSCULAR | Status: AC
  Filled 2017-05-15: qty 30

## 2017-05-15 MED ORDER — LACTATED RINGERS IV SOLN
INTRAVENOUS | Status: DC | PRN
  Administered 2017-05-15: 10:00:00 via INTRAVENOUS

## 2017-05-15 MED ORDER — PHENYLEPHRINE HCL 10 MG/ML IJ SOLN
INTRAVENOUS | Status: DC | PRN
  Administered 2017-05-15: 25 ug/min via INTRAVENOUS

## 2017-05-15 MED ORDER — CHLORHEXIDINE GLUCONATE 4 % EX LIQD: 60.0000 mL | Freq: Once | CUTANEOUS | Status: DC

## 2017-05-15 MED ORDER — ONDANSETRON HCL 4 MG/2ML IJ SOLN
INTRAMUSCULAR | Status: AC
  Filled 2017-05-15: qty 2

## 2017-05-15 MED ORDER — VITAMIN D 1000 UNITS PO TABS: 1000.0000 [IU] | ORAL_TABLET | Freq: Every day | ORAL | Status: DC

## 2017-05-15 MED ORDER — PROMETHAZINE HCL 25 MG/ML IJ SOLN: 6.2500 mg | INTRAMUSCULAR | Status: DC | PRN

## 2017-05-15 MED ORDER — DOCUSATE SODIUM 100 MG PO CAPS
100.0000 mg | ORAL_CAPSULE | Freq: Two times a day (BID) | ORAL | Status: DC
  Administered 2017-05-15: 100 mg via ORAL
  Filled 2017-05-15: qty 1

## 2017-05-15 MED ORDER — ONDANSETRON HCL 4 MG PO TABS: 4.0000 mg | ORAL_TABLET | Freq: Four times a day (QID) | ORAL | Status: DC | PRN

## 2017-05-15 MED ORDER — DEXAMETHASONE SODIUM PHOSPHATE 10 MG/ML IJ SOLN
INTRAMUSCULAR | Status: AC
Start: 2017-05-15 — End: ?
  Filled 2017-05-15: qty 1

## 2017-05-15 MED ORDER — CYSTEX PO LIQD: 15.0000 mL | Freq: Every day | ORAL | Status: DC

## 2017-05-15 MED ORDER — TRAMADOL HCL 50 MG PO TABS
50.0000 mg | ORAL_TABLET | Freq: Four times a day (QID) | ORAL | Status: DC | PRN
Start: 2017-05-15 — End: 2017-05-16
  Administered 2017-05-15 – 2017-05-16 (×2): 100 mg via ORAL
  Filled 2017-05-15 (×2): qty 2

## 2017-05-15 MED ORDER — SUGAMMADEX SODIUM 200 MG/2ML IV SOLN
INTRAVENOUS | Status: DC | PRN
  Administered 2017-05-15: 125 mg via INTRAVENOUS

## 2017-05-15 MED ORDER — FENTANYL CITRATE (PF) 100 MCG/2ML IJ SOLN
INTRAMUSCULAR | Status: AC
  Administered 2017-05-15: 50 ug
  Filled 2017-05-15: qty 2

## 2017-05-15 MED ORDER — METOCLOPRAMIDE HCL 5 MG PO TABS: 5.0000 mg | ORAL_TABLET | Freq: Three times a day (TID) | ORAL | Status: DC | PRN

## 2017-05-15 MED ORDER — PROPOFOL 10 MG/ML IV BOLUS
INTRAVENOUS | Status: DC | PRN
  Administered 2017-05-15: 120 mg via INTRAVENOUS

## 2017-05-15 MED ORDER — MORPHINE SULFATE (PF) 4 MG/ML IV SOLN: 0.5000 mg | INTRAVENOUS | Status: DC | PRN

## 2017-05-15 MED ORDER — ALENDRONATE SODIUM 70 MG PO TABS: 70.0000 mg | ORAL_TABLET | ORAL | Status: DC

## 2017-05-15 MED ORDER — POLYETHYLENE GLYCOL 3350 17 G PO PACK: 17.0000 g | PACK | Freq: Every day | ORAL | Status: DC | PRN

## 2017-05-15 MED ORDER — PANTOPRAZOLE SODIUM 40 MG PO TBEC
40.0000 mg | DELAYED_RELEASE_TABLET | Freq: Every day | ORAL | Status: DC
  Administered 2017-05-16: 40 mg via ORAL
  Filled 2017-05-15: qty 1

## 2017-05-15 MED ORDER — FENTANYL CITRATE (PF) 100 MCG/2ML IJ SOLN: 25.0000 ug | INTRAMUSCULAR | Status: DC | PRN

## 2017-05-15 MED ORDER — LIDOCAINE HCL (CARDIAC) 20 MG/ML IV SOLN
INTRAVENOUS | Status: DC | PRN
  Administered 2017-05-15: 50 mg via INTRAVENOUS

## 2017-05-15 MED ORDER — SODIUM CHLORIDE 0.9 % IV SOLN: INTRAVENOUS | Status: DC

## 2017-05-15 MED ORDER — HYDROXYZINE HCL 50 MG/ML IM SOLN
50.0000 mg | Freq: Four times a day (QID) | INTRAMUSCULAR | Status: DC | PRN
  Administered 2017-05-15: 50 mg via INTRAMUSCULAR
  Filled 2017-05-15: qty 1

## 2017-05-15 SURGICAL SUPPLY — 68 items
AID PSTN UNV HD RSTRNT DISP (MISCELLANEOUS)
BIT DRILL 170X2.5X (BIT) IMPLANT
BIT DRILL 5/64X5 DISP (BIT) ×1 IMPLANT
BIT DRL 170X2.5X (BIT)
BLADE SAG 18X100X1.27 (BLADE) ×2 IMPLANT
BOWL SMART MIX CTS (DISPOSABLE) ×1 IMPLANT
CAPT SHLDR REVTOTAL 2 ×1 IMPLANT
CEMENT HV SMART SET (Cement) ×1 IMPLANT
COVER SURGICAL LIGHT HANDLE (MISCELLANEOUS) ×2 IMPLANT
DRAPE INCISE IOBAN 66X45 STRL (DRAPES) ×2 IMPLANT
DRAPE ORTHO SPLIT 77X108 STRL (DRAPES) ×4
DRAPE SURG ORHT 6 SPLT 77X108 (DRAPES) ×2 IMPLANT
DRAPE U-SHAPE 47X51 STRL (DRAPES) ×2 IMPLANT
DRILL 2.5 (BIT)
DRSG ADAPTIC 3X8 NADH LF (GAUZE/BANDAGES/DRESSINGS) ×2 IMPLANT
DRSG PAD ABDOMINAL 8X10 ST (GAUZE/BANDAGES/DRESSINGS) ×3 IMPLANT
DURAPREP 26ML APPLICATOR (WOUND CARE) ×2 IMPLANT
ELECT BLADE 4.0 EZ CLEAN MEGAD (MISCELLANEOUS) ×2
ELECT NDL TIP 2.8 STRL (NEEDLE) ×1 IMPLANT
ELECT NEEDLE TIP 2.8 STRL (NEEDLE) ×2 IMPLANT
ELECT REM PT RETURN 9FT ADLT (ELECTROSURGICAL) ×2
ELECTRODE BLDE 4.0 EZ CLN MEGD (MISCELLANEOUS) ×1 IMPLANT
ELECTRODE REM PT RTRN 9FT ADLT (ELECTROSURGICAL) ×1 IMPLANT
GAUZE SPONGE 4X4 12PLY STRL (GAUZE/BANDAGES/DRESSINGS) ×2 IMPLANT
GLOVE BIOGEL PI IND STRL 6 (GLOVE) IMPLANT
GLOVE BIOGEL PI INDICATOR 6 (GLOVE) ×1
GLOVE BIOGEL PI ORTHO PRO 7.5 (GLOVE) ×1
GLOVE BIOGEL PI ORTHO PRO SZ8 (GLOVE) ×1
GLOVE ORTHO TXT STRL SZ7.5 (GLOVE) ×2 IMPLANT
GLOVE PI ORTHO PRO STRL 7.5 (GLOVE) ×1 IMPLANT
GLOVE PI ORTHO PRO STRL SZ8 (GLOVE) ×1 IMPLANT
GLOVE SURG ORTHO 8.5 STRL (GLOVE) ×2 IMPLANT
GLOVE SURG SS PI 6.0 STRL IVOR (GLOVE) ×1 IMPLANT
GOWN STRL REUS W/ TWL LRG LVL3 (GOWN DISPOSABLE) ×1 IMPLANT
GOWN STRL REUS W/ TWL XL LVL3 (GOWN DISPOSABLE) ×2 IMPLANT
GOWN STRL REUS W/TWL LRG LVL3 (GOWN DISPOSABLE) ×4
GOWN STRL REUS W/TWL XL LVL3 (GOWN DISPOSABLE) ×4
KIT BASIN OR (CUSTOM PROCEDURE TRAY) ×2 IMPLANT
KIT ROOM TURNOVER OR (KITS) ×2 IMPLANT
MANIFOLD NEPTUNE II (INSTRUMENTS) ×2 IMPLANT
NDL 1/2 CIR MAYO (NEEDLE) ×1 IMPLANT
NDL HYPO 25GX1X1/2 BEV (NEEDLE) ×1 IMPLANT
NEEDLE 1/2 CIR MAYO (NEEDLE) ×2 IMPLANT
NEEDLE HYPO 25GX1X1/2 BEV (NEEDLE) ×2 IMPLANT
NS IRRIG 1000ML POUR BTL (IV SOLUTION) ×2 IMPLANT
PACK SHOULDER (CUSTOM PROCEDURE TRAY) ×2 IMPLANT
PAD ARMBOARD 7.5X6 YLW CONV (MISCELLANEOUS) ×4 IMPLANT
PIN GUIDE 1.2 (PIN) IMPLANT
PIN GUIDE GLENOPHERE 1.5MX300M (PIN) IMPLANT
PIN METAGLENE 2.5 (PIN) IMPLANT
RESTRAINT HEAD UNIVERSAL NS (MISCELLANEOUS) ×1 IMPLANT
SPONGE LAP 18X18 X RAY DECT (DISPOSABLE) IMPLANT
SPONGE LAP 4X18 X RAY DECT (DISPOSABLE) ×2 IMPLANT
STRIP CLOSURE SKIN 1/2X4 (GAUZE/BANDAGES/DRESSINGS) ×2 IMPLANT
SUCTION FRAZIER HANDLE 10FR (MISCELLANEOUS) ×1
SUCTION TUBE FRAZIER 10FR DISP (MISCELLANEOUS) ×1 IMPLANT
SUT FIBERWIRE #2 38 T-5 BLUE (SUTURE) ×4
SUT MNCRL AB 4-0 PS2 18 (SUTURE) ×2 IMPLANT
SUT VIC AB 0 CT2 27 (SUTURE) ×2 IMPLANT
SUT VIC AB 2-0 CT1 27 (SUTURE) ×2
SUT VIC AB 2-0 CT1 TAPERPNT 27 (SUTURE) ×1 IMPLANT
SUT VICRYL 0 CT 1 36IN (SUTURE) ×2 IMPLANT
SUTURE FIBERWR #2 38 T-5 BLUE (SUTURE) IMPLANT
SYR CONTROL 10ML LL (SYRINGE) ×2 IMPLANT
TOWEL OR 17X24 6PK STRL BLUE (TOWEL DISPOSABLE) ×2 IMPLANT
TOWEL OR 17X26 10 PK STRL BLUE (TOWEL DISPOSABLE) ×2 IMPLANT
TOWER CARTRIDGE SMART MIX (DISPOSABLE) IMPLANT
YANKAUER SUCT BULB TIP NO VENT (SUCTIONS) ×2 IMPLANT

## 2017-05-15 NOTE — Anesthesia Procedure Notes (Signed)
Anesthesia Regional Block: Interscalene brachial plexus block   Pre-Anesthetic Checklist: ,, timeout performed, Correct Patient, Correct Site, Correct Laterality, Correct Procedure, Correct Position, site marked, Risks and benefits discussed,  Surgical consent,  Pre-op evaluation,  At surgeon's request and post-op pain management  Laterality: Left  Prep: chloraprep       Needles:  Injection technique: Single-shot  Needle Type: Stimiplex     Needle Length: 9cm    Needle insertion depth: 6 cm   Additional Needles:   Procedures:, nerve stimulator,,, ultrasound used (permanent image in chart),,,,   Nerve Stimulator or Paresthesia:  Response: Patellar snap, 0.5 mA,   Additional Responses:   Narrative:  Start time: 05/15/2017 9:26 AM End time: 05/15/2017 9:29 AM Injection made incrementally with aspirations every 5 mL.  Performed by: Personally  Anesthesiologist: Lewie Loron, MD  Additional Notes: Patient tolerated the procedure well without complications.

## 2017-05-15 NOTE — Transfer of Care (Signed)
Immediate Anesthesia Transfer of Care Note  Patient: Michele Bryan  Procedure(s) Performed: LEFT REVERSE SHOULDER ARTHROPLASTY (Left Shoulder)  Patient Location: PACU  Anesthesia Type:GA combined with regional for post-op pain  Level of Consciousness: awake, alert  and oriented  Airway & Oxygen Therapy: Patient Spontanous Breathing and Patient connected to nasal cannula oxygen  Post-op Assessment: Report given to RN, Post -op Vital signs reviewed and stable and Patient moving all extremities  Post vital signs: Reviewed and stable  Last Vitals:  Vitals:   05/15/17 0812 05/15/17 1217  BP: (!) 151/79 138/89  Pulse: 84 73  Resp: 16 15  Temp: (!) 36.4 C 36.6 C  SpO2: 99% 98%    Last Pain:  Vitals:   05/15/17 1217  TempSrc:   PainSc: (P) 0-No pain         Complications: No apparent anesthesia complications

## 2017-05-15 NOTE — Anesthesia Preprocedure Evaluation (Addendum)
Anesthesia Evaluation  Patient identified by MRN, date of birth, ID band Patient awake    Reviewed: Allergy & Precautions, H&P , NPO status , Patient's Chart, lab work & pertinent test results  History of Anesthesia Complications (+) PONV and history of anesthetic complications  Airway Mallampati: I  TM Distance: >3 FB Neck ROM: Full    Dental  (+) Dental Advisory Given, Teeth Intact   Pulmonary neg pulmonary ROS,    breath sounds clear to auscultation       Cardiovascular negative cardio ROS   Rhythm:Regular Rate:Normal     Neuro/Psych MS  Neuromuscular disease negative psych ROS   GI/Hepatic Neg liver ROS, GERD  Medicated,  Endo/Other  negative endocrine ROS  Renal/GU negative Renal ROS     Musculoskeletal  (+) Arthritis , Osteoarthritis,    Abdominal   Peds  Hematology negative hematology ROS (+) anemia ,   Anesthesia Other Findings   Reproductive/Obstetrics negative OB ROS                             Anesthesia Physical  Anesthesia Plan  ASA: III  Anesthesia Plan: General   Post-op Pain Management: GA combined w/ Regional for post-op pain   Induction: Intravenous  PONV Risk Score and Plan:   Airway Management Planned: Oral ETT  Additional Equipment:   Intra-op Plan:   Post-operative Plan: Extubation in OR  Informed Consent: I have reviewed the patients History and Physical, chart, labs and discussed the procedure including the risks, benefits and alternatives for the proposed anesthesia with the patient or authorized representative who has indicated his/her understanding and acceptance.   Dental advisory given  Plan Discussed with: CRNA  Anesthesia Plan Comments:         Anesthesia Quick Evaluation

## 2017-05-15 NOTE — Brief Op Note (Signed)
05/15/2017  12:14 PM  PATIENT:  Michele Bryan  77 y.o. female  PRE-OPERATIVE DIAGNOSIS:  Left shoulder rotator cuff arthropathy, rheumatoid arthritis  POST-OPERATIVE DIAGNOSIS:  Left shoulder rotator cuff arthropathy, rheumatoid arthritis  PROCEDURE:  Procedure(s): LEFT REVERSE SHOULDER ARTHROPLASTY (Left) DePuy Delta Xtend with subscapularis repair  SURGEON:  Surgeon(s) and Role:    Beverely Low, MD - Primary  PHYSICIAN ASSISTANT:   ASSISTANTS: Thea Gist, PA-C   ANESTHESIA:   regional and general  EBL:  200 cc   BLOOD ADMINISTERED:none  DRAINS: none   LOCAL MEDICATIONS USED:  MARCAINE     SPECIMEN:  No Specimen  DISPOSITION OF SPECIMEN:  N/A  COUNTS:  YES  TOURNIQUET:  * No tourniquets in log *  DICTATION: .Other Dictation: Dictation Number [id number]  PLAN OF CARE: Admit to inpatient   PATIENT DISPOSITION:  PACU - hemodynamically stable.   Delay start of Pharmacological VTE agent (>24hrs) due to surgical blood loss or risk of bleeding: not applicable

## 2017-05-15 NOTE — Anesthesia Procedure Notes (Signed)
Procedure Name: Intubation Date/Time: 05/15/2017 10:27 AM Performed by: Kyung Rudd, CRNA Pre-anesthesia Checklist: Patient identified, Emergency Drugs available, Suction available and Patient being monitored Patient Re-evaluated:Patient Re-evaluated prior to induction Oxygen Delivery Method: Circle system utilized Preoxygenation: Pre-oxygenation with 100% oxygen Induction Type: IV induction Ventilation: Mask ventilation without difficulty Laryngoscope Size: Mac and 3 Grade View: Grade I Tube type: Oral Tube size: 7.0 mm Number of attempts: 1 Airway Equipment and Method: Stylet Placement Confirmation: ETT inserted through vocal cords under direct vision,  positive ETCO2 and breath sounds checked- equal and bilateral Secured at: 21 cm Tube secured with: Tape Dental Injury: Teeth and Oropharynx as per pre-operative assessment

## 2017-05-15 NOTE — Progress Notes (Signed)
PHARMACIST - PHYSICIAN ORDER COMMUNICATION  CONCERNING: P&T Medication Policy on Herbal Medications  DESCRIPTION:  This patient's order for: Krill oil, Cystex liquid has been noted.  This product(s) is classified as an "herbal" or natural product. Due to a lack of definitive safety studies or FDA approval, nonstandard manufacturing practices, plus the potential risk of unknown drug-drug interactions while on inpatient medications, the Pharmacy and Therapeutics Committee does not permit the use of "herbal" or natural products of this type within [facility].   ACTION TAKEN: The pharmacy department is unable to verify this order at this time and your patient has been informed of this safety policy. Please reevaluate patient's clinical condition at discharge and address if the herbal or natural product(s) should be resumed at that time.

## 2017-05-15 NOTE — Interval H&P Note (Signed)
History and Physical Interval Note:  05/15/2017 10:15 AM  Michele Bryan  has presented today for surgery, with the diagnosis of Left shoulder rotator cuff arthropathy  The various methods of treatment have been discussed with the patient and family. After consideration of risks, benefits and other options for treatment, the patient has consented to  Procedure(s): LEFT REVERSE SHOULDER ARTHROPLASTY (Left) as a surgical intervention .  The patient's history has been reviewed, patient examined, no change in status, stable for surgery.  I have reviewed the patient's chart and labs.  Questions were answered to the patient's satisfaction.     Jamell Laymon,STEVEN R

## 2017-05-15 NOTE — Progress Notes (Signed)

## 2017-05-16 LAB — BASIC METABOLIC PANEL
Anion gap: 9 (ref 5–15)
BUN: 14 mg/dL (ref 6–20)
CO2: 25 mmol/L (ref 22–32)
Calcium: 8.9 mg/dL (ref 8.9–10.3)
Chloride: 103 mmol/L (ref 101–111)
Creatinine, Ser: 0.87 mg/dL (ref 0.44–1.00)
GFR calc Af Amer: >60 mL/min (ref >60)
GFR calc non Af Amer: >60 mL/min (ref >60)
Glucose, Bld: 132 mg/dL — ABNORMAL HIGH (ref 65–99)
Potassium: 4.2 mmol/L (ref 3.5–5.1)
Sodium: 137 mmol/L (ref 135–145)

## 2017-05-16 LAB — HEMOGLOBIN AND HEMATOCRIT, BLOOD
HCT: 26.6 % — ABNORMAL LOW (ref 36.0–46.0)
Hemoglobin: 8.6 g/dL — ABNORMAL LOW (ref 12.0–15.0)

## 2017-05-16 MED ORDER — ONDANSETRON HCL 4 MG PO TABS: 4.0000 mg | ORAL_TABLET | Freq: Three times a day (TID) | ORAL | 0 refills | Status: DC | PRN

## 2017-05-16 MED ORDER — FERROUS SULFATE 325 (65 FE) MG PO TBEC: 325.0000 mg | DELAYED_RELEASE_TABLET | Freq: Two times a day (BID) | ORAL | 0 refills | Status: DC

## 2017-05-16 MED ORDER — TRAMADOL HCL 50 MG PO TABS: 50.0000 mg | ORAL_TABLET | Freq: Four times a day (QID) | ORAL | 0 refills | Status: DC | PRN

## 2017-05-16 NOTE — Progress Notes (Signed)
Patient alert and oriented, mae's well, voiding adequate amount of urine, swallowing without difficulty, c/o mild pain at time of discharge. Patient discharged home with family. Script and discharged instructions given to patient. Patient and family stated understanding of instructions given. Patient has an appointment with Dr. Norris   

## 2017-05-16 NOTE — Op Note (Signed)
Michele Bryan NO.:  [id number]  MEDICAL RECORD NO.:  [id number]  LOCATION:  PERIO                        FACILITY:  [facility]  PHYSICIAN:  Almedia Balls. Ranell Patrick, M.D. DATE OF BIRTH:  April 23, 1940  DATE OF PROCEDURE:  05/15/2017 DATE OF DISCHARGE:                              OPERATIVE REPORT   PREOPERATIVE DIAGNOSIS:  Left shoulder rotator cuff tear arthropathy and rheumatoid arthritis.  POSTOPERATIVE DIAGNOSIS:  Left shoulder rotator cuff tear arthropathy and rheumatoid arthritis.  PROCEDURE PERFORMED:  Left reverse total shoulder arthroplasty using DePuy Delta Xtend prosthesis with subscapularis repair.  ASSISTANT:  Donnie Coffin. Dixon, PA-C, who was scrubbed during the entire procedure and necessary for satisfactory completion of surgery.  ANESTHESIA:  General anesthesia was used plus interscalene block.  ESTIMATED BLOOD LOSS:  200 mL.  FLUID REPLACEMENT:  1500 mL of crystalloid.  SPONGE COUNTS:  Correct.  COMPLICATIONS:  None.  Perioperative antibiotics were given.  INDICATIONS:  The patient is a 77 year old female with worsening left shoulder pain secondary to end-stage rheumatoid arthritis as well as rotator cuff insufficiency/rotator cuff tear arthropathy.  The patient has had progressive pain despite conservative management and desires operative treatment to restore function and eliminate pain.  Informed consent obtained.  DESCRIPTION OF PROCEDURE:  After an adequate level of anesthesia achieved, the patient was positioned in the modified beach-chair position.  The left shoulder correctly identified, sterilely prepped and draped in usual manner.  Time-out called.  We entered the patient's shoulder using a standard deltopectoral incision, starting at the coracoid process extending down to the anterior humerus.  Dissection down through the subcutaneous tissues using Bovie electrocautery. Cephalic vein was identified and taken laterally with the  deltoid, pectoralis taken medially.  Conjoint tendon identified and retracted medially.  The biceps was identified and tenodesed in situ in the biceps groove, incorporating the pectoralis tendon.  This was a soft tissue tenodesed with 2 figure-of-eight 0 Vicryl sutures.  We then released the subscapularis and tagged for repair at the end with #2 FiberWire suture in a modified Mason-Allen suture technique.  Next, we progressively externally rotated the humerus and released the inferior capsule.  We divided the biceps and released the anterior supraspinatus and some of the infraspinatus tendon.  This was degenerative tendon that was partially torn and we resected that portion of tendon that was in the joint.  Next, we entered the proximal humerus with a 6 mm reamer.  We reamed up to a size 8, could not get the 10 down, so we stuck with a size 8, placed our 8 intramedullary guide in the humerus and then did our head resection set on 10 degrees of retroversion for the DePuy Delta Xtend prosthesis.  Next, we removed excess osteophytes using a rongeur. We then subluxed the humerus posteriorly.  We did a 360-degree capsule labral excision.  There was severe hyperemia and a lot of bleeding in the shoulder due to the intense synovitis present throughout the joint. We did remove all the synovium we could find and got back to a good glenoid face.  We removed the remaining cartilage, placed our central guide pin and then reamed for the baseplate for the  Delta Xtend.  We then did our peripheral hand reaming, drilled our central peg hole impacted with Metaglene into position, placed our 42 screw inferiorly, a 36 at the base of coracoid, and then 18 nonlocked anteriorly.  We do not have enough room to get 1 in the back.  We had great purchase and security with the baseplate, placed a 38 standard glenosphere into position and screwed that home onto the baseplate, checked with a finger sweep to make sure  there was no soft tissue incorporated into that baseplate and glenosphere construct and then also checked the axillary nerve, which was free and clear.  We then completed our humeral preparation with the reamer for the size 1 left shoulder and we placed our trial components.  So, this was going to be a hybrid with a Porocoat 8 stem and a HA-coated size 1 left set on the 0 setting and placed in 10 degrees of retroversion.  We impacted that in position, reduced the shoulder with the 38 +3.  We thought, we could probably get a 38 +6 in, but felt very happy with the overall stability and range of motion.  I removed all trial components, irrigated thoroughly and then used a little bit of cement just in the potting-type technique right around the neck of the humeral implant, as we impacted that in place.  We took the real stem, size 8 Porocoat stem with the size 1 left HA-coated set on the 0 setting and placed in 10 degrees of retroversion and again just a little bit of cement proximally to stabilize the implant.  We impacted in place.  It was secure, so we did not wait for the cement to harden up.  We selected a 38 +6 poly and impacted that onto the humeral side. We then reduced the shoulder, had nice little pop and everything was very stable, so no gapping with inferior pole or external rotation and the conjoined tendon appropriately tight, but not over tight and then the axillary nerve was not over tightened.  We then irrigated thoroughly and then repaired the subscapularis anatomically back to the lesser tuberosity and also to the biceps.  We were pleased with that repair anteriorly, we were able to externally rotate to 45 degrees without subscap repair and I think that will enhance stability and function, we will need to protect that during the recovery process.  We irrigated thoroughly and closed the deltopectoral interval with 0 Vicryl suture, followed by 2-0 Vicryl for subcutaneous  closure and 4-0 Monocryl for the skin and portals.  Steri-Strips were applied, followed by sterile dressing.  The patient tolerated the surgery well.     Almedia Balls. Ranell Patrick, M.D.     SRN/MEDQ  D:  05/15/2017  T:  05/16/2017  Job:  [id number]

## 2017-05-16 NOTE — Progress Notes (Signed)
Orthopedics Progress Note  Subjective: Complaining of pain but controlled with Tramadol.  She had some nausea yesterday.  Objective:  Vitals:   05/16/17 0355 05/16/17 0743  BP: 107/65 113/73  Pulse: 78 73  Resp: 16 16  Temp: 98.2 F (36.8 C) 98.2 F (36.8 C)  SpO2: 100% 100%    General: Awake and alert  Musculoskeletal: left shoulder incision CDI, dressing changed, NVI Neurovascularly intact  Lab Results  Component Value Date   WBC 7.8 05/07/2017   HGB 8.6 (L) 05/16/2017   HCT 26.6 (L) 05/16/2017   MCV 91.1 05/07/2017   PLT 221 05/07/2017       Component Value Date/Time   NA 137 05/16/2017 0501   K 4.2 05/16/2017 0501   CL 103 05/16/2017 0501   CO2 25 05/16/2017 0501   GLUCOSE 132 (H) 05/16/2017 0501   BUN 14 05/16/2017 0501   CREATININE 0.87 05/16/2017 0501   CREATININE 0.75 04/09/2017 1019   CALCIUM 8.9 05/16/2017 0501   GFRNONAA >60 05/16/2017 0501   GFRNONAA 79 04/09/2017 1019   GFRAA >60 05/16/2017 0501   GFRAA 91 04/09/2017 1019    Lab Results  Component Value Date   INR 1.03 06/08/2012    Assessment/Plan: POD #1 s/p Procedure(s): LEFT REVERSE SHOULDER ARTHROPLASTY Doing well this morning and stable for discharge. Follow up in two weeks  Almedia Balls. Ranell Patrick, MD 05/16/2017 9:50 AM

## 2017-05-16 NOTE — Discharge Summary (Signed)
Orthopedic Discharge Summary        Physician Discharge Summary  Patient ID: Michele Bryan MRN: [id number] DOB/AGE: 1940/06/27 77 y.o.  Admit date: 05/15/2017 Discharge date: 05/16/2017   Procedures:  Procedure(s) (LRB): LEFT REVERSE SHOULDER ARTHROPLASTY (Left)  Attending Physician:  Dr. Malon Kindle  Admission Diagnoses:   Left shoulder OA, severe, primary  Discharge Diagnoses:  Left shoulder OA, severe, primary Acute blood loss anemia   Past Medical History:  Diagnosis Date  . Anemia   . Arthritis   . GERD (gastroesophageal reflux disease)   . History of blood transfusion   . Hyperlipidemia   . MS (multiple sclerosis) (HCC)   . Neuromuscular disorder (HCC)    multiple scleroosis/peripheral neuropathy  . PONV (postoperative nausea and vomiting)   . Rheumatoid arthritis (HCC)   . Ulcer   . Vitamin D deficiency     PCP: Sigmund Hazel, MD   Discharged Condition: good  Hospital Course:  Patient underwent the above stated procedure on 05/15/2017. Patient tolerated the procedure well and brought to the recovery room in good condition and subsequently to the floor. Patient had an uncomplicated hospital course and was stable for discharge. She did have post op anemia due to blood loss which we will treat with iron supplement   Disposition: Discharge disposition: 01-Home or Self Care      with follow up in 2 weeks     Discharge Instructions    Call MD / Call 911   Complete by:  As directed    If you experience chest pain or shortness of breath, CALL 911 and be transported to the hospital emergency room.  If you develope a fever above 101 F, pus (white drainage) or increased drainage or redness at the wound, or calf pain, call your surgeon's office.   Constipation Prevention   Complete by:  As directed    Drink plenty of fluids.  Prune juice may be helpful.  You may use a stool softener, such as Colace (over the counter) 100 mg twice a day.  Use MiraLax (over  the counter) for constipation as needed.   Diet - low sodium heart healthy   Complete by:  As directed    Increase activity slowly as tolerated   Complete by:  As directed       Allergies as of 05/16/2017      Reactions   Codeine Nausea And Vomiting   Demerol [meperidine] Nausea And Vomiting      Medication List    TAKE these medications   acetaminophen 650 MG CR tablet Commonly known as:  TYLENOL Take 650 mg by mouth every 8 (eight) hours as needed for pain.   alendronate 70 MG tablet Commonly known as:  FOSAMAX Take 70 mg by mouth every Tuesday.   cholecalciferol 1000 units tablet Commonly known as:  VITAMIN D Take 1,000 Units by mouth daily.   CYSTEX Liqd Take 15 mLs by mouth daily.   diclofenac sodium 1 % Gel Commonly known as:  VOLTAREN Apply 2 g topically 4 (four) times daily.   ferrous sulfate 325 (65 FE) MG EC tablet Take 1 tablet (325 mg total) by mouth 2 (two) times daily with a meal.   FLAXSEED OIL PO Take 15 mLs by mouth daily.   folic acid 1 MG tablet Commonly known as:  FOLVITE Take 1 mg by mouth daily.   hydroxychloroquine 200 MG tablet Commonly known as:  PLAQUENIL Take 1 tablet by mouth twice daily Monday-Friday.  Krill Oil 500 MG Caps Take 500 mg by mouth daily.   Magnesium 250 MG Tabs Take 250 mg by mouth daily.   methotrexate 2.5 MG tablet Commonly known as:  RHEUMATREX Take 3 tablets (7.5 mg total) by mouth once a week. Caution:Chemotherapy. Protect from light. What changed:    when to take this  additional instructions   multivitamin with minerals Tabs tablet Take 1 tablet by mouth daily.   omeprazole 40 MG capsule Commonly known as:  PRILOSEC Take 40 mg by mouth daily before breakfast.   ondansetron 4 MG tablet Commonly known as:  ZOFRAN Take 1 tablet (4 mg total) by mouth every 8 (eight) hours as needed for nausea or vomiting.   OVER THE COUNTER MEDICATION Apply 1 application topically 4 (four) times daily as needed  (for pain.). REAL TIME PAIN RELIEF HEMP OIL   traMADol 50 MG tablet Commonly known as:  ULTRAM Take 50 mg by mouth every 6 (six) hours as needed (for pain.). What changed:  Another medication with the same name was added. Make sure you understand how and when to take each.   traMADol 50 MG tablet Commonly known as:  ULTRAM Take 1-2 tablets (50-100 mg total) by mouth every 6 (six) hours as needed for moderate pain or severe pain (for pain.). What changed:  You were already taking a medication with the same name, and this prescription was added. Make sure you understand how and when to take each.   vitamin A 8000 UNIT capsule Take 8,000 Units by mouth daily.   vitamin E 400 UNIT capsule Take 400 Units by mouth daily.   zolpidem 10 MG tablet Commonly known as:  AMBIEN Take 5 mg by mouth at bedtime as needed for sleep.         Signed: [name] 05/16/2017, 9:58 AM  [facility] is now [employer] [address], [place], [place] [address] Phone: [phone] Facebook  Instagram  [employer]

## 2017-05-16 NOTE — Evaluation (Signed)
Occupational Therapy Evaluation Patient Details Name: Michele Bryan MRN: [id number] DOB: [birth year] Today's Date: 05/16/2017    History of Present Illness 77 y.o. Female s/p left reverse total shoulder. PHM including arthritis, MS, RA, R TKA, GERD, and peripheral neuropathy.    Clinical Impression   PTA, pt was living with her husband and was independent. Currently, pt requires Mod-Max A for UB ADLs, Min A for LB ADLs, and single hand held A functional mobility. Provided education on shoulder precautions, sling management, UB ADLs, LB ADLs, and toilet transfer; pt demonstrated and verbalized understanding. Answered all pt questions. Recommend dc home once medically stable per physician. All acute OT needs met and will sign off. Thank you.     Follow Up Recommendations  Follow surgeon's recommendation for DC plan and follow-up therapies;Supervision/Assistance - 24 hour    Equipment Recommendations  3 in 1 bedside commode    Recommendations for Other Services       Precautions / Restrictions Precautions Precautions: Shoulder Type of Shoulder Precautions: Conservative protocal. No ROM of shoulder. WFL hand, wrist, and elbow.  Shoulder Interventions: Shoulder sling/immobilizer;At all times;Off for dressing/bathing/exercises Precaution Booklet Issued: Yes (comment) Precaution Comments: Reviewed all shoulder precautions, excercises, and compensatory techniques for ADLs Required Braces or Orthoses: Sling Restrictions Weight Bearing Restrictions: Yes LUE Weight Bearing: Non weight bearing      Mobility Bed Mobility Overal bed mobility: Needs Assistance Bed Mobility: Supine to Sit     Supine to sit: Modified independent (Device/Increase time)     General bed mobility comments: Increased time  Transfers Overall transfer level: Needs assistance Equipment used: 1 person hand held assist Transfers: Sit to/from Stand Sit to Stand: Min assist         General transfer comment:  Min A for single hand held support    Balance Overall balance assessment: Needs assistance Sitting-balance support: No upper extremity supported;Feet supported Sitting balance-Leahy Scale: Good Sitting balance - Comments: donning pants   Standing balance support: Single extremity supported;During functional activity Standing balance-Leahy Scale: Fair                             ADL either performed or assessed with clinical judgement   ADL Overall ADL's : Needs assistance/impaired Eating/Feeding: Set up;Sitting   Grooming: Set up;Supervision/safety;Standing   Upper Body Bathing: Moderate assistance;Sitting;With caregiver independent assisting Upper Body Bathing Details (indicate cue type and reason): Educating pt on compensatory techniques for UB bathing. Pt demonstrating understanding Lower Body Bathing: Minimal assistance;Sit to/from stand   Upper Body Dressing : Maximal assistance;Sitting;With caregiver independent assisting;Cueing for UE precautions;Cueing for compensatory techniques;Cueing for sequencing;Adhering to UE precautions Upper Body Dressing Details (indicate cue type and reason): Husband donning shirt and sling demosntrating understanding of compensatory techniques.  Lower Body Dressing: Minimal assistance;Sit to/from stand;With caregiver independent assisting Lower Body Dressing Details (indicate cue type and reason): Min A from husband to bring waist bands over left hip Toilet Transfer: Min guard;Ambulation;Regular Toilet;Grab bars   Toileting- Clothing Manipulation and Hygiene: Min guard;Sit to/from stand       Functional mobility during ADLs: Minimal assistance General ADL Comments: Providing pt and family with education on shoulder precautions, UE excercises, compensatory tehcniques for ADLs, and safety.     Vision         Perception     Praxis      Pertinent Vitals/Pain Pain Assessment: Faces Faces Pain Scale: Hurts little more Pain  Location: L shoulder Pain  Descriptors / Indicators: Constant;Discomfort Pain Intervention(s): Monitored during session;Limited activity within patient's tolerance;Repositioned;Ice applied     Hand Dominance Right   Extremity/Trunk Assessment Upper Extremity Assessment Upper Extremity Assessment: LUE deficits/detail LUE Deficits / Details: s/p shoulder surgery. RA in hands.  LUE Coordination: decreased fine motor;decreased gross motor   Lower Extremity Assessment Lower Extremity Assessment: Overall WFL for tasks assessed   Cervical / Trunk Assessment Cervical / Trunk Assessment: Kyphotic   Communication Communication Communication: No difficulties   Cognition Arousal/Alertness: Awake/alert Behavior During Therapy: WFL for tasks assessed/performed Overall Cognitive Status: Within Functional Limits for tasks assessed                                     General Comments  Husband and son present throughout session    Exercises Exercises: Shoulder Shoulder Exercises Elbow Flexion: AROM;Left;10 reps;Seated Elbow Extension: AROM;Left;10 reps;Seated Wrist Flexion: AROM;Left;10 reps;Seated Wrist Extension: AROM;Left;10 reps;Seated Digit Composite Flexion: AROM;Left;10 reps;Seated Composite Extension: AROM;Left;10 reps;Seated Neck Flexion: AROM;5 reps;Seated Neck Extension: AROM;5 reps;Seated Neck Lateral Flexion - Right: AROM;5 reps;Seated Neck Lateral Flexion - Left: AROM;5 reps;Seated   Shoulder Instructions Shoulder Instructions Donning/doffing shirt without moving shoulder: Set-up;Supervision/safety;Caregiver independent with task;Patient able to independently direct caregiver Method for sponge bathing under operated UE: Set-up;Supervision/safety;Caregiver independent with task;Patient able to independently direct caregiver Donning/doffing sling/immobilizer: Set-up;Supervision/safety;Caregiver independent with task;Patient able to independently direct  caregiver Correct positioning of sling/immobilizer: Set-up;Supervision/safety;Caregiver independent with task;Patient able to independently direct caregiver ROM for elbow, wrist and digits of operated UE: Set-up;Supervision/safety;Caregiver independent with task;Patient able to independently direct caregiver Sling wearing schedule (on at all times/off for ADL's): Independent;Caregiver independent with task;Patient able to independently direct caregiver Proper positioning of operated UE when showering: Supervision/safety;Patient able to independently direct caregiver;Caregiver independent with task Positioning of UE while sleeping: Patient able to independently direct caregiver;Caregiver independent with task;Minimal assistance    Home Living Family/patient expects to be discharged to:: Private residence Living Arrangements: Spouse/significant other Available Help at Discharge: Family;Available 24 hours/day Type of Home: House       Home Layout: Two level;Able to live on main level with bedroom/bathroom     Bathroom Shower/Tub: Tub/shower unit;Walk-in shower   Bathroom Toilet: Standard     Home Equipment: Environmental consultant - 2 wheels;Shower seat          Prior Functioning/Environment Level of Independence: Independent                 OT Problem List: Decreased strength;Decreased range of motion;Decreased activity tolerance;Impaired balance (sitting and/or standing);Decreased knowledge of precautions;Pain;Impaired UE functional use      OT Treatment/Interventions:      OT Goals(Current goals can be found in the care plan section) Acute Rehab OT Goals Patient Stated Goal: Go home OT Goal Formulation: All assessment and education complete, DC therapy  OT Frequency:     Barriers to D/C:            Co-evaluation              AM-PAC PT "6 Clicks" Daily Activity     Outcome Measure Help from another person eating meals?: None Help from another person taking care of  personal grooming?: A Little Help from another person toileting, which includes using toliet, bedpan, or urinal?: A Little Help from another person bathing (including washing, rinsing, drying)?: A Lot Help from another person to put on and taking off regular upper body clothing?: A  Little Help from another person to put on and taking off regular lower body clothing?: A Lot 6 Click Score: 17   End of Session Equipment Utilized During Treatment: Other (comment)(Sling) Nurse Communication: Mobility status;Precautions  Activity Tolerance: Patient tolerated treatment well Patient left: in bed;with call bell/phone within reach;with family/visitor present  OT Visit Diagnosis: Unsteadiness on feet (R26.81);Other abnormalities of gait and mobility (R26.89);Muscle weakness (generalized) (M62.81);Pain Pain - Right/Left: Left Pain - part of body: Shoulder                Time: 0370-4888 OT Time Calculation (min): 34 min Charges:  OT General Charges $OT Visit: 1 Visit OT Evaluation $OT Eval Moderate Complexity: 1 Mod OT Treatments $Self Care/Home Management : 8-22 mins G-Codes:     [employer], OTR/L Acute Rehab Pager: [phone] Office: [place] 05/16/2017, 9:24 AM

## 2017-05-18 NOTE — Anesthesia Postprocedure Evaluation (Signed)
Anesthesia Post Note  Patient: Michele Bryan  Procedure(s) Performed: LEFT REVERSE SHOULDER ARTHROPLASTY (Left Shoulder)     Patient location during evaluation: PACU Anesthesia Type: General Level of consciousness: sedated and patient cooperative Pain management: pain level controlled Vital Signs Assessment: post-procedure vital signs reviewed and stable Respiratory status: spontaneous breathing Cardiovascular status: stable Anesthetic complications: no    Last Vitals:  Vitals:   05/16/17 0355 05/16/17 0743  BP: 107/65 113/73  Pulse: 78 73  Resp: 16 16  Temp: 36.8 C 36.8 C  SpO2: 100% 100%    Last Pain:  Vitals:   05/16/17 1024  TempSrc:   PainSc: 3                  Lewie Loron

## 2017-05-20 ENCOUNTER — Encounter (HOSPITAL_COMMUNITY): Payer: Self-pay | Admitting: Orthopedic Surgery

## 2017-06-28 ENCOUNTER — Other Ambulatory Visit: Payer: Self-pay | Admitting: Rheumatology

## 2017-06-29 NOTE — Telephone Encounter (Signed)
Last visit: 05/13/17 Next Visit due in June 2019. Message sent to front to schedule patient. Labs: 05/07/17 wnl  Okay to refill per [name]

## 2017-07-23 NOTE — Progress Notes (Signed)
Office Visit Note  Patient: Michele Bryan             Date of Birth: April 05, 1940           MRN: [id number]             PCP: Sigmund Hazel, MD Referring: Sigmund Hazel, MD Visit Date: 07/29/2017 Occupation: @GUAROCC @    Subjective:  Other (wants to discuss meds )   History of Present Illness: Michele Bryan is a 77 y.o. female with history of seropositive rheumatoid arthritis.  She states she continues to have pain and swelling in her bilateral hands.  She had to come off methotrexate for left total shoulder replacement surgery was on March 15.  She is gradually recovering from her shoulder replacement.  He had to come off methotrexate and Plaquenil at the time of surgery which she resumed later on.  She is a still taking methotrexate 4 tablets/week.  She continues to have pain in multiple joints but none of the other joints are swollen except her hands.  Activities of Daily Living:  Patient reports morning stiffness for 2 hours.   Patient Reports nocturnal pain.  Difficulty dressing/grooming: Denies Difficulty climbing stairs: Denies Difficulty getting out of chair: Reports Difficulty using hands for taps, buttons, cutlery, and/or writing: Denies   Review of Systems  Constitutional: Negative for fatigue, night sweats, weight gain and weight loss.  HENT: Positive for mouth dryness. Negative for mouth sores, trouble swallowing, trouble swallowing and nose dryness.   Eyes: Positive for dryness. Negative for pain, redness and visual disturbance.  Respiratory: Negative for cough, shortness of breath and difficulty breathing.   Cardiovascular: Negative for chest pain, palpitations, hypertension, irregular heartbeat and swelling in legs/feet.  Gastrointestinal: Positive for constipation and diarrhea. Negative for blood in stool.  Endocrine: Negative for increased urination.  Genitourinary: Negative for vaginal dryness.  Musculoskeletal: Positive for arthralgias, joint pain, joint  swelling, myalgias, morning stiffness and myalgias. Negative for muscle weakness and muscle tenderness.  Skin: Negative for color change, rash, hair loss, skin tightness, ulcers and sensitivity to sunlight.  Allergic/Immunologic: Negative for susceptible to infections.  Neurological: Negative for dizziness, memory loss, night sweats and weakness.  Hematological: Negative for swollen glands.  Psychiatric/Behavioral: Positive for sleep disturbance. Negative for depressed mood. The patient is not nervous/anxious.     PMFS History:  Patient Active Problem List   Diagnosis Date Noted  . 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 High risk medication use 05/06/2016  . Primary osteoarthritis of both hands 05/06/2016  . Primary osteoarthritis of left knee 05/06/2016  . Primary osteoarthritis of both feet 05/06/2016  . Dyslipidemia 05/06/2016  . Peripheral neuropathy 05/06/2016  . Diverticulosis of intestine without bleeding 05/06/2016  . Osteopenia  05/06/2016  . Vitamin D deficiency 05/06/2016  . 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 History of GI bleed 05/06/2016  . Cataract of both eyes 05/06/2016  . Urinary urgency 04/30/2015  . Chronic constipation 04/30/2015  . Multiple sclerosis (HCC) 03/23/2013  . Expected blood loss anemia 06/16/2012  . 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 Anemia   . Arthritis   . GERD (gastroesophageal reflux disease)   . History of blood transfusion   . Hyperlipidemia   . MS (multiple sclerosis) (HCC)   . Neuromuscular disorder (HCC)    multiple scleroosis/peripheral neuropathy  .  PONV (postoperative nausea and vomiting)   . Rheumatoid arthritis (HCC)   . Ulcer   . Vitamin D deficiency     Family History  Problem Relation Age of Onset  . Heart attack Father    Past Surgical History:  Procedure  Laterality Date  . breast augumentation    . BREAST SURGERY     biopsy  . COLONOSCOPY    . EYE SURGERY     both eys  . KNEE ARTHROSCOPY Right   . REVERSE SHOULDER ARTHROPLASTY Left 05/15/2017   Procedure: LEFT REVERSE SHOULDER ARTHROPLASTY;  Surgeon: Beverely Low, MD;  Location: [facility] OR;  Service: Orthopedics;  Laterality: Left;  . THYROID LOBECTOMY    . TOTAL KNEE ARTHROPLASTY Right 06/15/2012   Procedure: RIGHT TOTAL KNEE ARTHROPLASTY;  Surgeon: [name], MD;  Location: [facility] ORS;  Service: Orthopedics;  Laterality: Right;  . WRIST SURGERY Left    Social History   Social History Narrative   Patient lives at home with her husband [name]).   Retired.   Education- college.   Caffeine- Two cups of decaf tea daily.   Right handed.           Objective: Vital Signs: BP 137/79 (BP Location: Left Arm, Patient Position: Sitting, Cuff Size: Normal)   Pulse 72   Resp 14   Ht 5\' 4"  (1.626 m)   Wt 126 lb (57.2 kg)   BMI 21.63 kg/m    Physical Exam  Constitutional: She is oriented to person, place, and time. She appears well-developed and well-nourished.  HENT:  Head: Normocephalic and atraumatic.  Eyes: Conjunctivae and EOM are normal.  Neck: Normal range of motion.  Cardiovascular: Normal rate, regular rhythm, normal heart sounds and intact distal pulses.  Pulmonary/Chest: Effort normal and breath sounds normal.  Abdominal: Soft. Bowel sounds are normal.  Lymphadenopathy:    She has no cervical adenopathy.  Neurological: She is alert and oriented to person, place, and time.  Skin: Skin is warm and dry. Capillary refill takes less than 2 seconds.  Psychiatric: She has a normal mood and affect. Her behavior is normal.  Nursing note and vitals reviewed.    Musculoskeletal Exam: Spine good range of motion.  She has thoracic kyphosis.  Right shoulder joint was in good range of motion.  Left shoulder joint is limited to 70 degrees abduction.  Elbow joints were in good range of  motion.  She has tenderness on palpation of bilateral wrist joints.  She is synovitis of her MCPs and PIPs as described below.  Hip joints were in good range of motion.  Knee joints ankles were in good range of motion with no synovitis.  No MTP tenderness was noted.   CDAI Exam: CDAI Homunculus Exam:   Tenderness:  RUE: wrist LUE: glenohumeral Right hand: 2nd MCP, 5th MCP, 2nd PIP, 3rd PIP, 4th PIP and 5th PIP Left hand: 2nd MCP, 3rd MCP, 2nd PIP, 3rd PIP and 4th PIP  Swelling:  RUE: wrist Right hand: 2nd MCP, 5th MCP, 2nd PIP, 3rd PIP, 4th PIP and 5th PIP Left hand: 3rd MCP, 2nd PIP and 3rd PIP  Joint Counts:  CDAI Tender Joint count: 13 CDAI Swollen Joint count: 10  Global Assessments:  Patient Global Assessment: 7 Provider Global Assessment: 7  CDAI Calculated Score: 37    Investigation: No additional findings. CBC Latest Ref Rng & Units 05/16/2017 05/07/2017 04/09/2017  WBC 4.0 - 10.5 K/uL - 7.8 4.6  Hemoglobin 12.0 - 15.0 g/dL 06/07/2017)  12.4 11.8  Hematocrit 36.0 - 46.0 % 26.6(L) 38.9 35.8  Platelets 150 - 400 K/uL - 221 240   CMP Latest Ref Rng & Units 05/16/2017 05/07/2017 04/09/2017  Glucose 65 - 99 mg/dL 492(E) 93 82  BUN 6 - 20 mg/dL 14 15 15   Creatinine 0.44 - 1.00 mg/dL 1.00 7.12 1.97  Sodium 135 - 145 mmol/L 137 138 140  Potassium 3.5 - 5.1 mmol/L 4.2 4.4 4.7  Chloride 101 - 111 mmol/L 103 104 106  CO2 22 - 32 mmol/L 25 24 27   Calcium 8.9 - 10.3 mg/dL 8.9 9.4 9.4  Total Protein 6.1 - 8.1 g/dL - - 6.5  Total Bilirubin 0.2 - 1.2 mg/dL - - 0.8  Alkaline Phos 33 - 130 U/L - - -  AST 10 - 35 U/L - - 16  ALT 6 - 29 U/L - - 12    Imaging: No results found.  Speciality Comments: No specialty comments available.    Procedures:  No procedures performed Allergies: Codeine and Demerol [meperidine]   Assessment / Plan:     Visit Diagnoses: 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.  Patient is having a flare of  rheumatoid arthritis.  She states she came off the medications for total shoulder replacement.  She resumed methotrexate about 2 to 3 weeks ago.  She is on low-dose methotrexate along with Plaquenil.  We discussed increasing methotrexate to 6 tablets p.o. weekly and monitor her labs in couple of weeks to see how she responds to it.  If her labs are stable we may will be able to increase her methotrexate further to 8 tablets p.o. weekly.  Due to her history of multiple sclerosis the choices of DMARDs and a small molecules are very limited.  Harriette Ohara could be a possible option to add in future.  High risk medication use - PLQ 200 mg po bid M-F, MTX 4 tabs po q wk, folic acid 1 mg po qd. eye exam? - Plan: CBC with Differential/Platelet, COMPLETE METABOLIC PANEL WITH GFR today and then in 2 weeks.  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 Devonne Doughty.  She has limited range of motion of her shoulder.  Primary osteoarthritis of both hands-she has severe osteoarthritis.  Status post right knee replacement-doing well.  Primary osteoarthritis of left knee-she has a stiffness and discomfort.  Primary osteoarthritis of both feet-proper fitting shoes has been helpful.  Postural kyphosis of thoracic region  Osteopenia of multiple sites - Patient reports her bone densities have been stable. She is on Fosamax 70 mg weekly per her PCP.  History of multiple sclerosis-followed by neurologist.  Other medical problems are listed as follows: History of diverticulosis  Dyslipidemia  History of vitamin D deficiency  History of GI bleed  History of peripheral neuropathy  History of cataract    Orders: Orders Placed This Encounter  Procedures  . CBC with Differential/Platelet  . COMPLETE METABOLIC PANEL WITH GFR   Meds ordered this encounter  Medications  . methotrexate (RHEUMATREX) 2.5 MG tablet    Sig: Take 6 tablets po q week    Dispense:  24 tablet    Refill:  2    Face-to-face  time spent with patient was 30 minutes. >50% of time was spent in counseling and coordination of care.  Follow-Up Instructions: Return in about 3 months (around 10/29/2017) for Rheumatoid arthritis.   Pollyann Savoy, MD  Note - This record has been created using Dragon  software.  Chart creation errors have been sought, but may not always  have been located. Such creation errors do not reflect on  the standard of medical care.

## 2017-07-26 ENCOUNTER — Encounter: Payer: Self-pay | Admitting: Neurology

## 2017-07-28 ENCOUNTER — Telehealth: Payer: Self-pay | Admitting: Neurology

## 2017-07-28 ENCOUNTER — Telehealth: Payer: Self-pay | Admitting: Adult Health

## 2017-07-28 ENCOUNTER — Ambulatory Visit: Payer: Medicare Other | Admitting: Adult Health

## 2017-07-28 ENCOUNTER — Encounter: Payer: Self-pay | Admitting: Adult Health

## 2017-07-28 VITALS — BP 126/77 | Ht 64.0 in | Wt 125.4 lb

## 2017-07-28 DIAGNOSIS — R29818 Other symptoms and signs involving the nervous system: Secondary | ICD-10-CM

## 2017-07-28 DIAGNOSIS — G35 Multiple sclerosis: Secondary | ICD-10-CM | POA: Diagnosis not present

## 2017-07-28 MED ORDER — ASPIRIN EC 81 MG PO TBEC: 81.0000 mg | DELAYED_RELEASE_TABLET | Freq: Every day | ORAL | Status: DC

## 2017-07-28 NOTE — Telephone Encounter (Signed)
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

## 2017-07-28 NOTE — Progress Notes (Signed)
Chief Complaint  Patient presents with  . New Patient (Initial Visit)    Referral from Dr. 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 Terrace Arabia    History of Present Illness   Michele Bryan is a 77 years old right-handed Caucasian female, followup for relapsing remitting multiple sclerosis, she was previously patients of Dr. love, last clinical visit was in Feb 2016. She presented with numbness from neck down, double vision, blurry vision in 1971, gait difficulty, ambulate with a walker at its worst, her symptoms gradually improved with steroid treatment, She was diagnosed with multiple sclerosis, but was never offered any immunomodulation therapies. Over years, she had a few flareups of acute onset vertigo, double vision, with worsening gait difficulty,   the most recent one was in December 2014, lasting for 2 week, improved by [employer]. Previous: MRI study of the brain per record was abnormal showing evidence of long T2 signal changes in the supraventricular and  periventricular region of the cerebral hemispheres,  MRI cervical spine 2001 showed a lesion at C2. C3 She has a long history of B12 deficiency, treated with B12 shots in the past, on sublingual.  She has bladder incontinence and urinary tract infections as often as every 6 weeks and is followed by Dr. Jethro Bolus. She is a retired Comptroller, still active, independent in her daily activity, driving without difficulty, exercise regularly, she has mild unsteady gait which has been fairly stable over the years Laboratory evaluation January 2015 showed normal CMP, CBC, and uric acid, rheumatoid factor was elevated 23, negative ANA, she was recently diagnosed with rheumatoid arthritis, she is now treated with plaquenil  Feb 26th 2016 visit: She had GI bleeding in summer of 2015, she is no longer taking NSAIDs,  She still drives, ambulate without a  cane, urinary urgency, wearing pads, practice TaiGi, Yoga regularly, ride stationary bike, likes to read,  She lives at home, rheumatoid arthritis, taking Plaquenil, vitamin B12 deficiency, by mouth B12 supplement,  Apr 30 2015 visit Dr. Terrace Arabia: Last visit was in Feb 2016,  She still drives, no gait difficulty, she did trip and fall in Jan 2017, with left wrist fracture, has to have left wrist surgery, followed by OT,  She has mild urinary urgency, mild constipation, mild blurry vision, she has no memory loss, enjoys reading   07/29/27 UPDATE: Patient is being seen today for recent symptoms of left eye blindness, burning sensation in the left leg, and and balance.  She has been having these symptoms since shoulder surgery in March 2019.  She states she woke up Sunday morning and was unable to see anything out of her left eye and this fully resolved by Monday morning but did not seek medical attention.  This was an abrupt onset and did not gradually worsen gradually resolved.  Left leg burning has been present for approximately 2 weeks and she has had this feeling in the past for the last for a couple of months and then diminished for couple months.  This pain radiates from her hip down to her toes.  For the past couple months she is complaining of imbalance and having an increase in imbalance with fatigue or being busy and active.  Denies weakness, numbness/tingling or speech difficulty. Patient does have a history of MS but is not currently on any medications for this and has been stable since previous visit in 2017.  She denies stroke/TIA symptoms or history.  Denies migraines or headaches.  Denies any seizure activity.   Review of systems performed and notable only for incontinence of bladder, frequent urination, bruise easily, insomnia, joints pain, joint swelling, low back pain.  ALLERGIES: Allergies  Allergen Reactions  . Codeine Nausea And Vomiting  . Demerol [Meperidine] Nausea And Vomiting     HOME MEDICATIONS: Outpatient Medications Prior to Visit  Medication Sig Dispense Refill  . acetaminophen (TYLENOL) 650 MG CR tablet Take 650 mg by mouth every 8 (eight) hours as needed for pain.    Marland Kitchen alendronate (FOSAMAX) 70 MG tablet Take 70 mg by mouth every Tuesday.     . cholecalciferol (VITAMIN D) 1000 units tablet Take 1,000 Units by mouth daily.     . ferrous sulfate 325 (65 FE) MG EC tablet Take 1 tablet (325 mg total) by mouth 2 (two) times daily with a meal. 60 tablet 0  . folic acid (FOLVITE) 1 MG tablet Take 1 mg by mouth daily.    . hydroxychloroquine (PLAQUENIL) 200 MG tablet Take 1 tablet by mouth twice daily Monday-Friday. 120 tablet 0  . Krill Oil 500 MG CAPS Take 500 mg by mouth daily.    . Magnesium 250 MG TABS Take 250 mg by mouth daily.    . methotrexate (RHEUMATREX) 2.5 MG tablet TAKE 3 TABLETS BY MOUTH ONCE A WEEK **CAUTION :CHEMOTHERAPHY  PROTECT FROM LIGHT** (Patient taking differently: take 4 pills weekly) 12 tablet 1  . Misc Natural Products (CYSTEX) LIQD Take 15 mLs by mouth daily.    . Multiple Vitamin (MULTIVITAMIN WITH MINERALS) TABS Take 1 tablet by mouth daily.    Marland Kitchen omeprazole (PRILOSEC) 40 MG capsule Take 40 mg by mouth daily before breakfast.     . ondansetron (ZOFRAN) 4 MG tablet Take 1 tablet (4 mg total) by mouth every 8 (eight) hours as needed for nausea or vomiting. 20 tablet 0  . OVER THE COUNTER MEDICATION Apply 1 application topically 4 (four) times daily as needed (for pain.). REAL TIME PAIN RELIEF HEMP OIL    . vitamin A 8000 UNIT capsule Take 8,000 Units by mouth daily.    . vitamin E 400 UNIT capsule Take 400 Units by mouth daily.    Marland Kitchen zolpidem (AMBIEN) 10 MG tablet Take 5 mg by mouth at bedtime as needed for sleep.     Marland Kitchen diclofenac sodium (VOLTAREN) 1 % GEL Apply 2 g topically 4 (four) times daily. (Patient not taking: Reported on 07/28/2017) 3 Tube 1  . ferrous sulfate 325 (65 FE) MG tablet ferrous sulfate 325mg  tablet  TAKE 1 TABLET TWICE A  DAY WITH A MEAL    . ferrous sulfate 325 (65 FE) MG tablet TAKE 1 TABLET TWICE A DAY WITH A MEAL  0  . Flaxseed, Linseed, (FLAXSEED OIL PO) Take 15 mLs by mouth daily.    . folic acid (FOLVITE) 1 MG tablet folic acid 1 mg tablet   1 mg by oral route.    . hydroxychloroquine (PLAQUENIL) 200 MG tablet hydroxychloroquine 200 mg tablet    . Krill Oil 500 MG CAPS krill oil 500 mg capsule   500 mg by oral route.    . methotrexate 2.5 MG tablet methotrexate sodium 2.5 mg tablet   7.5 mg by oral route.    Marland Kitchen omeprazole (PRILOSEC) 40 MG capsule omeprazole 40 mg capsule,delayed release    . traMADol (ULTRAM) 50 MG tablet Take 50 mg by mouth every 6 (six)  hours as needed (for pain.).     Marland Kitchen traMADol (ULTRAM) 50 MG tablet Take 1-2 tablets (50-100 mg total) by mouth every 6 (six) hours as needed for moderate pain or severe pain (for pain.). (Patient not taking: Reported on 07/28/2017) 40 tablet 0  . vitamin A 8000 UNIT capsule vitamin A 8,000 unit capsule   8000 units by oral route.    Marland Kitchen zolpidem (AMBIEN) 10 MG tablet zolpidem 10 mg tablet   5 mg by oral route.     No facility-administered medications prior to visit.     PAST MEDICAL HISTORY: Past Medical History:  Diagnosis Date  . Anemia   . Arthritis   . GERD (gastroesophageal reflux disease)   . History of blood transfusion   . Hyperlipidemia   . MS (multiple sclerosis) (HCC)   . Neuromuscular disorder (HCC)    multiple scleroosis/peripheral neuropathy  . PONV (postoperative nausea and vomiting)   . Rheumatoid arthritis (HCC)   . Ulcer   . Vitamin D deficiency     PAST SURGICAL HISTORY: Past Surgical History:  Procedure Laterality Date  . breast augumentation    . BREAST SURGERY     biopsy  . COLONOSCOPY    . EYE SURGERY     both eys  . KNEE ARTHROSCOPY Right   . REVERSE SHOULDER ARTHROPLASTY Left 05/15/2017   Procedure: LEFT REVERSE SHOULDER ARTHROPLASTY;  Surgeon: Beverely Low, MD;  Location: [facility];  Service: Orthopedics;   Laterality: Left;  . THYROID LOBECTOMY    . TOTAL KNEE ARTHROPLASTY Right 06/15/2012   Procedure: RIGHT TOTAL KNEE ARTHROPLASTY;  Surgeon: [name], MD;  Location: [facility] ORS;  Service: Orthopedics;  Laterality: Right;  . WRIST SURGERY Left     FAMILY HISTORY: Family History  Problem Relation Age of Onset  . Heart attack Father     SOCIAL HISTORY:  Social History   Socioeconomic History  . Marital status: Married    Spouse name: Michele Bryan  . Number of children: 3  . Years of education: college  . Highest education level: Not on file  Occupational History    Comment: retired  Engineer, production  . Financial resource strain: Not on file  . Food insecurity:    Worry: Not on file    Inability: Not on file  . Transportation needs:    Medical: Not on file    Non-medical: Not on file  Tobacco Use  . Smoking status: Never Smoker  . Smokeless tobacco: Never Used  Substance and Sexual Activity  . Alcohol use: No  . Drug use: No  . Sexual activity: Not on file  Lifestyle  . Physical activity:    Days per week: Not on file    Minutes per session: Not on file  . Stress: Not on file  Relationships  . Social connections:    Talks on phone: Not on file    Gets together: Not on file    Attends religious service: Not on file    Active member of club or organization: Not on file    Attends meetings of clubs or organizations: Not on file    Relationship status: Not on file  . Intimate partner violence:    Fear of current or ex partner: Not on file    Emotionally abused: Not on file    Physically abused: Not on file    Forced sexual activity: Not on file  Other Topics Concern  . Not on file  Social History Narrative  Patient lives at home with her husband Michele Bryan).   Retired.   Education- college.   Caffeine- Two cups of decaf tea daily.   Right handed.           PHYSICAL EXAM   Vitals:   07/28/17 1453  BP: 126/77  Weight: 125 lb 6.4 oz (56.9 kg)  Height: 5\' 4"  (1.626 m)     Not recorded      Body mass index is 21.52 kg/m.  PHYSICAL EXAMNIATION:  Gen: NAD, conversant, well nourised, obese, well groomed                     Cardiovascular: Regular rate rhythm, no peripheral edema, warm, nontender. Eyes: Conjunctivae clear without exudates or hemorrhage Neck: Supple, no carotid bruise. Pulmonary: Clear to auscultation bilaterally   NEUROLOGICAL EXAM:  MENTAL STATUS: Speech:    Speech is normal; fluent and spontaneous with normal comprehension.  Cognition:    The patient is oriented to person, place, and time;     recent and remote memory intact;     language fluent;     normal attention, concentration,     fund of knowledge.  CRANIAL NERVES: CN II: Visual fields are full to confrontation. Fundoscopic exam is normal with sharp discs and no vascular changes. Venous pulsations are present bilaterally. Pupils are 4 mm and briskly reactive to light. Visual acuity is 20/20 bilaterally. CN III, IV, VI: extraocular movement are normal. No ptosis. CN V: Facial sensation is intact to pinprick in all 3 divisions bilaterally. CN VII: Face is symmetric with normal eye closure and smile. CN VIII: Hearing is normal to rubbing fingers CN IX, X: Palate elevates symmetrically. Phonation is normal. CN XI: Head turning and shoulder shrug are intact CN XII: Tongue is midline with normal movements and no atrophy.  MOTOR: There is no pronator drift of out-stretched arms. Muscle bulk and tone are normal. Deformity of bilateral hand joints due to rheumatoid arthritis  REFLEXES: Reflexes are 3 and symmetric at the biceps, triceps, knees, and ankles. Plantar responses are flexor.  SENSORY: Light touch, pinprick, position sense, and vibration sense are intact in fingers and toes.  COORDINATION: Rapid alternating movements and fine finger movements are intact. There is no dysmetria on finger-to-nose and heel-knee-shin.   GAIT/STANCE: Mildly cautious, wide-based,  good arm swing, and turning. Mild scoliosis.  Romberg negative and able to do tandem gait with mild difficulty.  DIAGNOSTIC DATA (LABS, IMAGING, TESTING) - I reviewed patient records, labs, notes, testing and imaging myself where available.  Lab Results  Component Value Date   WBC 7.8 05/07/2017   HGB 8.6 (L) 05/16/2017   HCT 26.6 (L) 05/16/2017   MCV 91.1 05/07/2017   PLT 221 05/07/2017      Component Value Date/Time   NA 137 05/16/2017 0501   K 4.2 05/16/2017 0501   CL 103 05/16/2017 0501   CO2 25 05/16/2017 0501   GLUCOSE 132 (H) 05/16/2017 0501   BUN 14 05/16/2017 0501   CREATININE 0.87 05/16/2017 0501   CREATININE 0.75 04/09/2017 1019   CALCIUM 8.9 05/16/2017 0501   PROT 6.5 04/09/2017 1019   ALBUMIN 4.5 10/09/2016 1453   AST 16 04/09/2017 1019   ALT 12 04/09/2017 1019   ALKPHOS 52 10/09/2016 1453   BILITOT 0.8 04/09/2017 1019   GFRNONAA >60 05/16/2017 0501   GFRNONAA 79 04/09/2017 1019   GFRAA >60 05/16/2017 0501   GFRAA 91 04/09/2017 1019   ASSESSMENT AND PLAN  77  years old Caucasian female with relapsing remitting MS with the last MRI in 20 and previous appointment in 2017 along with mild gait difficulty.  Patient has complaints of recent transient vision loss in left eye, burning sensation radiating down left leg and worsening in balance.   PLAN: After speaking to Dr. Terrace Arabia regarding plan for patient, it was recommended for patient to undergo MRI brain along with MRA of head and neck to assess for possible stroke as Dr. Terrace Arabia feels as though this is less related to MS and greater chance of a stroke.  Patient was also started on aspirin 81 mg for stroke prevention.  Patient was advised to continue following PCP in regards to blood pressure and cholesterol monitoring along with following up with [name] for RA.  Patient was advised to follow-up in 4 weeks with Dr. Terrace Arabia or call earlier if needed.  Patient was advised to call 911 if she experiences symptoms like this  again such as complete left eye vision loss or acute neurological symptoms.  Patient verbalized understanding.  George Hugh, AGNP-BC  [facility] [address] [place], [place] [address]  Phone [phone] Fax [phone]

## 2017-07-28 NOTE — Patient Instructions (Signed)
Start aspirin 81 mg daily stroke prevention  We will call you to schedule appointment for MRI and MRA  Continue to follow up with PCP regarding blood pressure and cholesterol monitoring  Continue to follow up with [name] for RA monitoring  Continue to monitor blood pressure at home  Maintain strict control of hypertension with blood pressure goal below 130/90, diabetes with hemoglobin A1c goal below 6.5% and cholesterol with LDL cholesterol (bad cholesterol) goal below 70 mg/dL. I also advised the patient to eat a healthy diet with plenty of whole grains, cereals, fruits and vegetables, exercise regularly and maintain ideal body weight.  Followup in the future with Dr. Terrace Arabia in 4 weeks or call earlier if needed          Thank you for coming to see [place] at [facility]. I hope we have been able to provide you high quality care today.  You may receive a patient satisfaction survey over the next few weeks. We would appreciate your feedback and comments so that we may continue to improve ourselves and the health of our patients.

## 2017-07-28 NOTE — Telephone Encounter (Signed)
[employer] Medicare order sent to GI. No auth they will reach out to the pt to schedule.  °

## 2017-07-28 NOTE — Telephone Encounter (Signed)
I spoke to this patient at 7:45am this morning.  She reported waking up Sunday, 07/26/17, with the following symptoms: partial vision loss in left eye, burning sensation in left leg, worsening gait, nausea and diarrhea.  The symptoms continued throughout the day with slow improvement.  She was feeling nearly back to baseline by Monday, 07/27/17.  She did not seek out any medical advice during this event.  Per Dr. Terrace Arabia, she needs to be evaluated in our office today and she would like her to be seen by NP.  Spoke to [place] who has agreed to see her in an available slot at 3:45pm.  The patient is aware to arrive to our office at 2:45pm.

## 2017-07-28 NOTE — Addendum Note (Signed)
Addended by: George Hugh on: 07/28/2017 05:18 PM   Modules accepted: Orders

## 2017-07-28 NOTE — Telephone Encounter (Signed)
When you get a chance can you switch the MRA Neck order to a MRA Neck w/wo contrast.

## 2017-07-29 ENCOUNTER — Ambulatory Visit: Payer: Medicare Other | Admitting: Rheumatology

## 2017-07-29 ENCOUNTER — Encounter: Payer: Self-pay | Admitting: Rheumatology

## 2017-07-29 VITALS — BP 137/79 | HR 72 | Resp 14 | Ht 64.0 in | Wt 126.0 lb

## 2017-07-29 DIAGNOSIS — Z8639 Personal history of other endocrine, nutritional and metabolic disease: Secondary | ICD-10-CM

## 2017-07-29 DIAGNOSIS — G35 Multiple sclerosis: Secondary | ICD-10-CM

## 2017-07-29 DIAGNOSIS — Z96651 Presence of right artificial knee joint: Secondary | ICD-10-CM

## 2017-07-29 DIAGNOSIS — M19041 Primary osteoarthritis, right hand: Secondary | ICD-10-CM | POA: Diagnosis not present

## 2017-07-29 DIAGNOSIS — Z79899 Other long term (current) drug therapy: Secondary | ICD-10-CM

## 2017-07-29 DIAGNOSIS — M1712 Unilateral primary osteoarthritis, left knee: Secondary | ICD-10-CM

## 2017-07-29 DIAGNOSIS — E785 Hyperlipidemia, unspecified: Secondary | ICD-10-CM

## 2017-07-29 DIAGNOSIS — M0579 Rheumatoid arthritis with rheumatoid factor of multiple sites without organ or systems involvement: Secondary | ICD-10-CM | POA: Diagnosis not present

## 2017-07-29 DIAGNOSIS — M4004 Postural kyphosis, thoracic region: Secondary | ICD-10-CM | POA: Diagnosis not present

## 2017-07-29 DIAGNOSIS — Z8669 Personal history of other diseases of the nervous system and sense organs: Secondary | ICD-10-CM

## 2017-07-29 DIAGNOSIS — M19071 Primary osteoarthritis, right ankle and foot: Secondary | ICD-10-CM

## 2017-07-29 DIAGNOSIS — Z96612 Presence of left artificial shoulder joint: Secondary | ICD-10-CM | POA: Diagnosis not present

## 2017-07-29 DIAGNOSIS — Z8719 Personal history of other diseases of the digestive system: Secondary | ICD-10-CM

## 2017-07-29 DIAGNOSIS — M19072 Primary osteoarthritis, left ankle and foot: Secondary | ICD-10-CM

## 2017-07-29 DIAGNOSIS — M8589 Other specified disorders of bone density and structure, multiple sites: Secondary | ICD-10-CM

## 2017-07-29 DIAGNOSIS — M19042 Primary osteoarthritis, left hand: Secondary | ICD-10-CM

## 2017-07-29 MED ORDER — METHOTREXATE 2.5 MG PO TABS: ORAL_TABLET | ORAL | 2 refills | Status: DC

## 2017-07-29 NOTE — Patient Instructions (Signed)
Standing Labs We placed an order today for your standing lab work.    Please come back and get your standing labs in 2 weeks then every 2 months  We have open lab Monday through Friday from 8:30-11:30 AM and 1:30-4:00 PM  at the office of Dr. Pollyann Savoy.   You may experience shorter wait times on Monday and Friday afternoons. The office is located at [address], [place], [place] [address] No appointment is necessary.   Labs are drawn by [facility].  You may receive a bill from [facility] for your lab work. If you have any questions regarding directions or hours of operation,  please call [phone].

## 2017-07-29 NOTE — Progress Notes (Signed)
I have reviewed and agreed above plan. 

## 2017-07-30 LAB — CBC WITH DIFFERENTIAL/PLATELET
Basophils Absolute: 41 cells/uL (ref 0–200)
Basophils Relative: 0.7 %
Eosinophils Absolute: 0 cells/uL — ABNORMAL LOW (ref 15–500)
Eosinophils Relative: 0 %
HCT: 35.4 % (ref 35.0–45.0)
Hemoglobin: 11.5 g/dL — ABNORMAL LOW (ref 11.7–15.5)
Lymphs Abs: 1941 cells/uL (ref 850–3900)
MCH: 28.8 pg (ref 27.0–33.0)
MCHC: 32.5 g/dL (ref 32.0–36.0)
MCV: 88.7 fL (ref 80.0–100.0)
MPV: 10 fL (ref 7.5–12.5)
Monocytes Relative: 10.6 %
Neutro Abs: 3292 cells/uL (ref 1500–7800)
Neutrophils Relative %: 55.8 %
Platelets: 222 10*3/uL (ref 140–400)
RBC: 3.99 10*6/uL (ref 3.80–5.10)
RDW: 13.3 % (ref 11.0–15.0)
Total Lymphocyte: 32.9 %
WBC mixed population: 625 cells/uL (ref 200–950)
WBC: 5.9 10*3/uL (ref 3.8–10.8)

## 2017-07-30 LAB — COMPLETE METABOLIC PANEL WITH GFR
AG Ratio: 2 (calc) (ref 1.0–2.5)
ALT: 10 U/L (ref 6–29)
AST: 15 U/L (ref 10–35)
Albumin: 4.3 g/dL (ref 3.6–5.1)
Alkaline phosphatase (APISO): 61 U/L (ref 33–130)
BUN: 22 mg/dL (ref 7–25)
CO2: 27 mmol/L (ref 20–32)
Calcium: 9.2 mg/dL (ref 8.6–10.4)
Chloride: 105 mmol/L (ref 98–110)
Creat: 0.8 mg/dL (ref 0.60–0.93)
GFR, Est African American: 84 mL/min/{1.73_m2} (ref >=60)
GFR, Est Non African American: 73 mL/min/{1.73_m2} (ref >=60)
Globulin: 2.2 g/dL (calc) (ref 1.9–3.7)
Glucose, Bld: 80 mg/dL (ref 65–99)
Potassium: 4.5 mmol/L (ref 3.5–5.3)
Sodium: 138 mmol/L (ref 135–146)
Total Bilirubin: 0.6 mg/dL (ref 0.2–1.2)
Total Protein: 6.5 g/dL (ref 6.1–8.1)

## 2017-07-30 NOTE — Progress Notes (Signed)
Mild anemia

## 2017-08-04 ENCOUNTER — Ambulatory Visit
Admission: RE | Admit: 2017-08-04 | Discharge: 2017-08-04 | Disposition: A | Discharge status: Home / Self Care | Payer: Medicare Other | Source: Ambulatory Visit | Attending: Adult Health | Admitting: Adult Health

## 2017-08-04 DIAGNOSIS — G35 Multiple sclerosis: Secondary | ICD-10-CM | POA: Diagnosis not present

## 2017-08-04 DIAGNOSIS — R29818 Other symptoms and signs involving the nervous system: Secondary | ICD-10-CM | POA: Diagnosis not present

## 2017-08-04 MED ORDER — GADOBENATE DIMEGLUMINE 529 MG/ML IV SOLN
11.0000 mL | Freq: Once | INTRAVENOUS | Status: AC | PRN
  Administered 2017-08-04: 11 mL via INTRAVENOUS

## 2017-08-05 ENCOUNTER — Telehealth: Payer: Self-pay | Admitting: *Deleted

## 2017-08-05 NOTE — Telephone Encounter (Signed)
Spoke to her son (on [place]) - he is aware of results.

## 2017-08-05 NOTE — Telephone Encounter (Signed)
George Hugh, NP  Lilla Shook, RN        Please notify patient that all scans were unremarkable without evidence of new stroke. Follow up with Dr. Terrace Arabia at scheduled appointment. Thank you.

## 2017-08-08 ENCOUNTER — Other Ambulatory Visit: Payer: Self-pay | Admitting: Rheumatology

## 2017-08-10 NOTE — Telephone Encounter (Signed)
Last Visit: 07/29/17 Next visit: 10/29/17 Labs: 07/29/17 Mild anemia  PLQ Eye Exam: 07/30/17 WNL   Okay to refill per [name]

## 2017-08-14 ENCOUNTER — Other Ambulatory Visit: Payer: Self-pay

## 2017-08-14 DIAGNOSIS — Z79899 Other long term (current) drug therapy: Secondary | ICD-10-CM

## 2017-08-15 LAB — COMPLETE METABOLIC PANEL WITH GFR
AG Ratio: 1.9 (calc) (ref 1.0–2.5)
ALT: 11 U/L (ref 6–29)
AST: 18 U/L (ref 10–35)
Albumin: 4.3 g/dL (ref 3.6–5.1)
Alkaline phosphatase (APISO): 60 U/L (ref 33–130)
BUN: 18 mg/dL (ref 7–25)
CO2: 29 mmol/L (ref 20–32)
Calcium: 9.4 mg/dL (ref 8.6–10.4)
Chloride: 100 mmol/L (ref 98–110)
Creat: 0.81 mg/dL (ref 0.60–0.93)
GFR, Est African American: 83 mL/min/{1.73_m2} (ref >=60)
GFR, Est Non African American: 72 mL/min/{1.73_m2} (ref >=60)
Globulin: 2.3 g/dL (calc) (ref 1.9–3.7)
Glucose, Bld: 82 mg/dL (ref 65–99)
Potassium: 4.4 mmol/L (ref 3.5–5.3)
Sodium: 135 mmol/L (ref 135–146)
Total Bilirubin: 0.6 mg/dL (ref 0.2–1.2)
Total Protein: 6.6 g/dL (ref 6.1–8.1)

## 2017-08-15 LAB — CBC WITH DIFFERENTIAL/PLATELET
Basophils Absolute: 48 cells/uL (ref 0–200)
Basophils Relative: 0.9 %
Eosinophils Absolute: 0 cells/uL — ABNORMAL LOW (ref 15–500)
Eosinophils Relative: 0 %
HCT: 33.9 % — ABNORMAL LOW (ref 35.0–45.0)
Hemoglobin: 11.2 g/dL — ABNORMAL LOW (ref 11.7–15.5)
Lymphs Abs: 1829 cells/uL (ref 850–3900)
MCH: 29.1 pg (ref 27.0–33.0)
MCHC: 33 g/dL (ref 32.0–36.0)
MCV: 88.1 fL (ref 80.0–100.0)
MPV: 10.5 fL (ref 7.5–12.5)
Monocytes Relative: 11.6 %
Neutro Abs: 2809 cells/uL (ref 1500–7800)
Neutrophils Relative %: 53 %
Platelets: 182 10*3/uL (ref 140–400)
RBC: 3.85 10*6/uL (ref 3.80–5.10)
RDW: 13.5 % (ref 11.0–15.0)
Total Lymphocyte: 34.5 %
WBC mixed population: 615 cells/uL (ref 200–950)
WBC: 5.3 10*3/uL (ref 3.8–10.8)

## 2017-09-29 ENCOUNTER — Ambulatory Visit: Payer: Medicare Other | Admitting: Neurology

## 2017-09-29 ENCOUNTER — Encounter

## 2017-09-29 ENCOUNTER — Encounter: Payer: Self-pay | Admitting: Neurology

## 2017-09-29 VITALS — BP 149/82 | HR 68 | Ht 64.0 in | Wt 128.0 lb

## 2017-09-29 DIAGNOSIS — M5412 Radiculopathy, cervical region: Secondary | ICD-10-CM | POA: Insufficient documentation

## 2017-09-29 DIAGNOSIS — G35 Multiple sclerosis: Secondary | ICD-10-CM | POA: Diagnosis not present

## 2017-09-29 MED ORDER — GABAPENTIN 100 MG PO CAPS: 100.0000 mg | ORAL_CAPSULE | Freq: Three times a day (TID) | ORAL | 6 refills | Status: DC

## 2017-09-29 NOTE — Progress Notes (Addendum)
Chief Complaint  Patient presents with  . Follow-up    RM 4, alone. Vision with correction: R: 20/30, L: 20/30, B:20/30  . Dizziness    Pt complaining of vertigo that is getting better. Pt has symptoms when she changes positions.    History of Present Illness   Michele Bryan is a 77 years old right-handed Caucasian female, followup for relapsing remitting multiple sclerosis, she was previously patients of Dr. love, last clinical visit was in Feb 2016. She presented with numbness from neck down, double vision, blurry vision in 1971, gait difficulty, ambulate with a walker at its worst, her symptoms gradually improved with steroid treatment, She was diagnosed with multiple sclerosis, but was never offered any immunomodulation therapies. Over years, she had a few flareups of acute onset vertigo, double vision, with worsening gait difficulty,   the most recent one was in December 2014, lasting for 2 week, improved by [employer]. Previous: MRI study of the brain per record was abnormal showing evidence of long T2 signal changes in the supraventricular and  periventricular region of the cerebral hemispheres,  MRI cervical spine 2001 showed a lesion at C2. C3 She has a long history of B12 deficiency, treated with B12 shots in the past, on sublingual.  She has bladder incontinence and urinary tract infections as often as every 6 weeks and is followed by Dr. Jethro Bolus. She is a retired Comptroller, still active, independent in her daily activity, driving without difficulty, exercise regularly, she has mild unsteady gait which has been fairly stable over the years Laboratory evaluation January 2015 showed normal CMP, CBC, and uric acid, rheumatoid factor was elevated 23, negative ANA, she was recently diagnosed with rheumatoid arthritis, she is now treated with plaquenil  Feb 26th 2016 visit: She had GI bleeding in summer of 2015, she is no longer taking NSAIDs,  She still drives, ambulate without a cane,  urinary urgency, wearing pads, practice TaiGi, Yoga regularly, ride stationary bike, likes to read,  She lives at home, rheumatoid arthritis, taking Plaquenil, vitamin B12 deficiency, by mouth B12 supplement,  Apr 30 2015 visit  She still drives, no gait difficulty, she did trip and fall in Jan 2017, with left wrist fracture, has to have left wrist surgery, followed by OT,  She has mild urinary urgency, mild constipation, mild blurry vision, she has no memory loss, enjoys reading   UPDATE September 29 2017: She had left shoulder replacement in March 2019, recovered well, she now complains of 6 weeks history of radiating pain from left neck to left shoulder, constant, unbearable sometimes, she takes frequent Tylenol.  She had MRI of the brain for complaints of 2 months history of left visual field deficit, we have personally reviewed films on August 05, 2017, MRI of the brain showed generalized atrophy, bilateral supratentorium demonstrates subcortical oval-shaped periventricular white matter changes, compatible with chronic demyelinating disease, versus small vessel disease, MRA of the brain and neck showed no significant large vessel disease, She has significant deformity of bilateral hands joints, has rheumatoid arthritis  Review of systems performed and notable only for as above. ALLERGIES: Allergies  Allergen Reactions  . Codeine Nausea And Vomiting  . Demerol [Meperidine] Nausea And Vomiting    HOME MEDICATIONS: Outpatient Medications Prior to Visit  Medication Sig Dispense Refill  . acetaminophen (TYLENOL) 650 MG CR tablet Take 650 mg by mouth every 8 (eight) hours as needed for pain.    Marland Kitchen alendronate (FOSAMAX) 70 MG tablet Take 70 mg by mouth every  Tuesday.     . cholecalciferol (VITAMIN D) 1000 units tablet Take 1,000 Units by mouth daily.     . ferrous sulfate 325 (65 FE) MG EC tablet Take 1 tablet (325 mg total) by mouth 2 (two) times daily with a meal. 60 tablet 0  . folic acid  (FOLVITE) 1 MG tablet Take 1 mg by mouth daily.    . hydroxychloroquine (PLAQUENIL) 200 MG tablet Take 1 tablet by mouth twice daily Monday-Friday. 120 tablet 0  . Krill Oil 500 MG CAPS Take 500 mg by mouth daily.    . Magnesium 250 MG TABS Take 250 mg by mouth daily.    . methotrexate (RHEUMATREX) 2.5 MG tablet TAKE 3 TABLETS BY MOUTH ONCE A WEEK **CAUTION :CHEMOTHERAPHY  PROTECT FROM LIGHT** (Patient taking differently: take 4 pills weekly) 12 tablet 1  . Misc Natural Products (CYSTEX) LIQD Take 15 mLs by mouth daily.    . Multiple Vitamin (MULTIVITAMIN WITH MINERALS) TABS Take 1 tablet by mouth daily.    Marland Kitchen omeprazole (PRILOSEC) 40 MG capsule Take 40 mg by mouth daily before breakfast.     . ondansetron (ZOFRAN) 4 MG tablet Take 1 tablet (4 mg total) by mouth every 8 (eight) hours as needed for nausea or vomiting. 20 tablet 0  . OVER THE COUNTER MEDICATION Apply 1 application topically 4 (four) times daily as needed (for pain.). REAL TIME PAIN RELIEF HEMP OIL    . vitamin A 8000 UNIT capsule Take 8,000 Units by mouth daily.    . vitamin E 400 UNIT capsule Take 400 Units by mouth daily.    Marland Kitchen zolpidem (AMBIEN) 10 MG tablet Take 5 mg by mouth at bedtime as needed for sleep.     Marland Kitchen diclofenac sodium (VOLTAREN) 1 % GEL Apply 2 g topically 4 (four) times daily. (Patient not taking: Reported on 07/28/2017) 3 Tube 1  . ferrous sulfate 325 (65 FE) MG tablet ferrous sulfate 325mg  tablet  TAKE 1 TABLET TWICE A DAY WITH A MEAL    . ferrous sulfate 325 (65 FE) MG tablet TAKE 1 TABLET TWICE A DAY WITH A MEAL  0  . Flaxseed, Linseed, (FLAXSEED OIL PO) Take 15 mLs by mouth daily.    . folic acid (FOLVITE) 1 MG tablet folic acid 1 mg tablet   1 mg by oral route.    . hydroxychloroquine (PLAQUENIL) 200 MG tablet hydroxychloroquine 200 mg tablet    . Krill Oil 500 MG CAPS krill oil 500 mg capsule   500 mg by oral route.    . methotrexate 2.5 MG tablet methotrexate sodium 2.5 mg tablet   7.5 mg by oral route.     Marland Kitchen omeprazole (PRILOSEC) 40 MG capsule omeprazole 40 mg capsule,delayed release    . traMADol (ULTRAM) 50 MG tablet Take 50 mg by mouth every 6 (six) hours as needed (for pain.).     Marland Kitchen traMADol (ULTRAM) 50 MG tablet Take 1-2 tablets (50-100 mg total) by mouth every 6 (six) hours as needed for moderate pain or severe pain (for pain.). (Patient not taking: Reported on 07/28/2017) 40 tablet 0  . vitamin A 8000 UNIT capsule vitamin A 8,000 unit capsule   8000 units by oral route.    Marland Kitchen zolpidem (AMBIEN) 10 MG tablet zolpidem 10 mg tablet   5 mg by oral route.     No facility-administered medications prior to visit.     PAST MEDICAL HISTORY: Past Medical History:  Diagnosis Date  . Anemia   .  Arthritis   . GERD (gastroesophageal reflux disease)   . History of blood transfusion   . Hyperlipidemia   . MS (multiple sclerosis) (HCC)   . Neuromuscular disorder (HCC)    multiple scleroosis/peripheral neuropathy  . PONV (postoperative nausea and vomiting)   . Rheumatoid arthritis (HCC)   . Ulcer   . Vitamin D deficiency     PAST SURGICAL HISTORY: Past Surgical History:  Procedure Laterality Date  . breast augumentation    . BREAST SURGERY     biopsy  . COLONOSCOPY    . EYE SURGERY     both eys  . KNEE ARTHROSCOPY Right   . REVERSE SHOULDER ARTHROPLASTY Left 05/15/2017   Procedure: LEFT REVERSE SHOULDER ARTHROPLASTY;  Surgeon: Beverely Low, MD;  Location: [facility] OR;  Service: Orthopedics;  Laterality: Left;  . THYROID LOBECTOMY    . TOTAL KNEE ARTHROPLASTY Right 06/15/2012   Procedure: RIGHT TOTAL KNEE ARTHROPLASTY;  Surgeon: [name], MD;  Location: [facility] ORS;  Service: Orthopedics;  Laterality: Right;  . WRIST SURGERY Left     FAMILY HISTORY: Family History  Problem Relation Age of Onset  . Heart attack Father     SOCIAL HISTORY:  Social History   Socioeconomic History  . Marital status: Married    Spouse name: [name]  . Number of children: 3  . Years of education: college   . Highest education level: Not on file  Occupational History    Comment: retired  Engineer, production  . Financial resource strain: Not on file  . Food insecurity:    Worry: Not on file    Inability: Not on file  . Transportation needs:    Medical: Not on file    Non-medical: Not on file  Tobacco Use  . Smoking status: Never Smoker  . Smokeless tobacco: Never Used  Substance and Sexual Activity  . Alcohol use: No  . Drug use: Never  . Sexual activity: Not on file  Lifestyle  . Physical activity:    Days per week: Not on file    Minutes per session: Not on file  . Stress: Not on file  Relationships  . Social connections:    Talks on phone: Not on file    Gets together: Not on file    Attends religious service: Not on file    Active member of club or organization: Not on file    Attends meetings of clubs or organizations: Not on file    Relationship status: Not on file  . Intimate partner violence:    Fear of current or ex partner: Not on file    Emotionally abused: Not on file    Physically abused: Not on file    Forced sexual activity: Not on file  Other Topics Concern  . Not on file  Social History Narrative   Patient lives at home with her husband [name]).   Retired.   Education- college.   Caffeine- Two cups of decaf tea daily.   Right handed.           PHYSICAL EXAM   Vitals:   09/29/17 0717  BP: (!) 149/82  Pulse: 68  Weight: 128 lb (58.1 kg)  Height: 5\' 4"  (1.626 m)    Not recorded      Body mass index is 21.97 kg/m.  PHYSICAL EXAMNIATION:  Gen: NAD, conversant, well nourised, obese, well groomed  Cardiovascular: Regular rate rhythm, no peripheral edema, warm, nontender. Eyes: Conjunctivae clear without exudates or hemorrhage Neck: Supple, no carotid bruise. Pulmonary: Clear to auscultation bilaterally   NEUROLOGICAL EXAM:  MENTAL STATUS: Speech:    Speech is normal; fluent and spontaneous with normal comprehension.   Cognition:    The patient is oriented to person, place, and time;     recent and remote memory intact;     language fluent;     normal attention, concentration,     fund of knowledge.  CRANIAL NERVES: CN II: Visual fields are full to confrontation.   Pupils are 4 mm and briskly reactive to light.  CN III, IV, VI: extraocular movement are normal. No ptosis. CN V: Facial sensation is intact to pinprick in all 3 divisions bilaterally. CN VII: Face is symmetric with normal eye closure and smile. CN VIII: Hearing is normal to rubbing fingers CN IX, X: Palate elevates symmetrically. Phonation is normal. CN XI: Head turning and shoulder shrug are intact CN XII: Tongue is midline with normal movements and no atrophy.  MOTOR: There is no pronator drift of out-stretched arms. Muscle bulk and tone are normal. Deformity of bilateral hand joints due to rheumatoid arthritis  REFLEXES: Reflexes are 3 and symmetric at the biceps, triceps, knees, and ankles. Plantar responses are flexor.  SENSORY: Light touch, pinprick, position sense, and vibration sense are intact in fingers and toes.  COORDINATION: Rapid alternating movements and fine finger movements are intact. There is no dysmetria on finger-to-nose and heel-knee-shin.   GAIT/STANCE: Mildly cautious, wide-based, good arm swing, and turning. Mild scoliosis.     DIAGNOSTIC DATA (LABS, IMAGING, TESTING) - I reviewed patient records, labs, notes, testing and imaging myself where available.  Lab Results  Component Value Date   WBC 5.3 08/14/2017   HGB 11.2 (L) 08/14/2017   HCT 33.9 (L) 08/14/2017   MCV 88.1 08/14/2017   PLT 182 08/14/2017      Component Value Date/Time   NA 135 08/14/2017 1414   K 4.4 08/14/2017 1414   CL 100 08/14/2017 1414   CO2 29 08/14/2017 1414   GLUCOSE 82 08/14/2017 1414   BUN 18 08/14/2017 1414   CREATININE 0.81 08/14/2017 1414   CALCIUM 9.4 08/14/2017 1414   PROT 6.6 08/14/2017 1414   ALBUMIN 4.5  10/09/2016 1453   AST 18 08/14/2017 1414   ALT 11 08/14/2017 1414   ALKPHOS 52 10/09/2016 1453   BILITOT 0.6 08/14/2017 1414   GFRNONAA 72 08/14/2017 1414   GFRAA 83 08/14/2017 1414   ASSESSMENT AND PLAN   77  years old Caucasian female Probable Relapsing Remitting Multiple Sclerosis  MRI of the brain showed stable lesions,  Has never been treated with long-term immunomodulation therapy, Rheumatoid arthritis 6 weeks history of left neck pain radiating pain to left shoulder  Most consistent with left cervical radiculopathy  Proceed with MRI of cervical spine  Gabapentin 100 mg 3 times a day     Levert Feinstein, M.D. Ph.D.  [facility] [address] [place], [place] [address] Phone: [phone] Fax:      [phone]

## 2017-10-20 NOTE — Progress Notes (Deleted)
Office Visit Note  Patient: Michele Bryan             Date of Birth: December 02, 1940           MRN: [id number]             PCP: Sigmund Hazel, MD Referring: Sigmund Hazel, MD Visit Date: 10/29/2017 Occupation: @GUAROCC @  Subjective:  No chief complaint on file.   History of Present Illness: Michele Bryan is a 77 y.o. female ***   Activities of Daily Living:  Patient reports morning stiffness for *** {minute/hour:19697}.   Patient {ACTIONS;DENIES/REPORTS:21021675::"Denies"} nocturnal pain.  Difficulty dressing/grooming: {ACTIONS;DENIES/REPORTS:21021675::"Denies"} Difficulty climbing stairs: {ACTIONS;DENIES/REPORTS:21021675::"Denies"} Difficulty getting out of chair: {ACTIONS;DENIES/REPORTS:21021675::"Denies"} Difficulty using hands for taps, buttons, cutlery, and/or writing: {ACTIONS;DENIES/REPORTS:21021675::"Denies"}  No Rheumatology ROS completed.   PMFS History:  Patient Active Problem List   Diagnosis Date Noted  . Left cervical radiculopathy 09/29/2017  . 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 High risk medication use 05/06/2016  . Primary osteoarthritis of both hands 05/06/2016  . Primary osteoarthritis of left knee 05/06/2016  . Primary osteoarthritis of both feet 05/06/2016  . Dyslipidemia 05/06/2016  . Peripheral neuropathy 05/06/2016  . Diverticulosis of intestine without bleeding 05/06/2016  . Osteopenia  05/06/2016  . Vitamin D deficiency 05/06/2016  . 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 History of GI bleed 05/06/2016  . Cataract of both eyes 05/06/2016  . Urinary urgency 04/30/2015  . Chronic constipation 04/30/2015  . Multiple sclerosis (HCC) 03/23/2013  . Expected blood loss anemia 06/16/2012  . 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 Anemia   . Arthritis   . GERD (gastroesophageal reflux disease)    . History of blood transfusion   . Hyperlipidemia   . MS (multiple sclerosis) (HCC)   . Neuromuscular disorder (HCC)    multiple scleroosis/peripheral neuropathy  . PONV (postoperative nausea and vomiting)   . Rheumatoid arthritis (HCC)   . Ulcer   . Vitamin D deficiency     Family History  Problem Relation Age of Onset  . Heart attack Father    Past Surgical History:  Procedure Laterality Date  . breast augumentation    . BREAST SURGERY     biopsy  . COLONOSCOPY    . EYE SURGERY     both eys  . KNEE ARTHROSCOPY Right   . REVERSE SHOULDER ARTHROPLASTY Left 05/15/2017   Procedure: LEFT REVERSE SHOULDER ARTHROPLASTY;  Surgeon: 05/17/2017, MD;  Location: [facility] OR;  Service: Orthopedics;  Laterality: Left;  . THYROID LOBECTOMY    . TOTAL KNEE ARTHROPLASTY Right 06/15/2012   Procedure: RIGHT TOTAL KNEE ARTHROPLASTY;  Surgeon: 06/17/2012, MD;  Location: [facility] ORS;  Service: Orthopedics;  Laterality: Right;  . WRIST SURGERY Left    Social History   Social History Narrative   Patient lives at home with her husband [name]).   Retired.   Education- college.   Caffeine- Two cups of decaf tea daily.   Right handed.          Objective: Vital Signs: There were no vitals taken for this visit.   Physical Exam   Musculoskeletal Exam: ***  CDAI Exam: CDAI Score: Not documented Patient Global Assessment: Not documented; Provider Global Assessment: Not documented Swollen: Not documented; Tender: Not documented Joint Exam   Not documented   There is currently no  information documented on the homunculus. Go to the Rheumatology activity and complete the homunculus joint exam.  Investigation: No additional findings.  Imaging: No results found.  Recent Labs: Lab Results  Component Value Date   WBC 5.3 08/14/2017   HGB 11.2 (L) 08/14/2017   PLT 182 08/14/2017   NA 135 08/14/2017   K 4.4 08/14/2017   CL 100 08/14/2017   CO2 29 08/14/2017   GLUCOSE 82 08/14/2017   BUN  18 08/14/2017   CREATININE 0.81 08/14/2017   BILITOT 0.6 08/14/2017   ALKPHOS 52 10/09/2016   AST 18 08/14/2017   ALT 11 08/14/2017   PROT 6.6 08/14/2017   ALBUMIN 4.5 10/09/2016   CALCIUM 9.4 08/14/2017   GFRAA 83 08/14/2017    Speciality Comments: PLQ Eye Exam: 07/30/17 WNL @ [facility]  Procedures:  No procedures performed Allergies: Codeine and Demerol [meperidine]   Assessment / Plan:     Visit Diagnoses: No diagnosis found.   Orders: No orders of the defined types were placed in this encounter.  No orders of the defined types were placed in this encounter.   Face-to-face time spent with patient was *** minutes. Greater than 50% of time was spent in counseling and coordination of care.  Follow-Up Instructions: No follow-ups on file.   Ellen Henri, CMA  Note - This record has been created using Animal nutritionist.  Chart creation errors have been sought, but may not always  have been located. Such creation errors do not reflect on  the standard of medical care.

## 2017-10-21 ENCOUNTER — Ambulatory Visit
Admission: RE | Admit: 2017-10-21 | Discharge: 2017-10-21 | Disposition: A | Discharge status: Home / Self Care | Payer: Medicare Other | Source: Ambulatory Visit | Attending: Neurology | Admitting: Neurology

## 2017-10-21 ENCOUNTER — Other Ambulatory Visit: Payer: Self-pay

## 2017-10-21 DIAGNOSIS — M5412 Radiculopathy, cervical region: Secondary | ICD-10-CM

## 2017-10-21 DIAGNOSIS — G35 Multiple sclerosis: Secondary | ICD-10-CM

## 2017-10-21 DIAGNOSIS — Z79899 Other long term (current) drug therapy: Secondary | ICD-10-CM

## 2017-10-21 LAB — CBC WITH DIFFERENTIAL/PLATELET
Basophils Absolute: 32 cells/uL (ref 0–200)
Basophils Relative: 0.6 %
Eosinophils Absolute: 130 cells/uL (ref 15–500)
Eosinophils Relative: 2.4 %
HCT: 32.5 % — ABNORMAL LOW (ref 35.0–45.0)
Hemoglobin: 10.8 g/dL — ABNORMAL LOW (ref 11.7–15.5)
Lymphs Abs: 1712 cells/uL (ref 850–3900)
MCH: 29.1 pg (ref 27.0–33.0)
MCHC: 33.2 g/dL (ref 32.0–36.0)
MCV: 87.6 fL (ref 80.0–100.0)
MPV: 9.8 fL (ref 7.5–12.5)
Monocytes Relative: 10.2 %
Neutro Abs: 2975 cells/uL (ref 1500–7800)
Neutrophils Relative %: 55.1 %
Platelets: 230 10*3/uL (ref 140–400)
RBC: 3.71 10*6/uL — ABNORMAL LOW (ref 3.80–5.10)
RDW: 14.4 % (ref 11.0–15.0)
Total Lymphocyte: 31.7 %
WBC mixed population: 551 cells/uL (ref 200–950)
WBC: 5.4 10*3/uL (ref 3.8–10.8)

## 2017-10-21 LAB — COMPLETE METABOLIC PANEL WITH GFR
AG Ratio: 2.2 (calc) (ref 1.0–2.5)
ALT: 9 U/L (ref 6–29)
AST: 15 U/L (ref 10–35)
Albumin: 4.2 g/dL (ref 3.6–5.1)
Alkaline phosphatase (APISO): 55 U/L (ref 33–130)
BUN/Creatinine Ratio: 15 (calc) (ref 6–22)
BUN: 15 mg/dL (ref 7–25)
CO2: 24 mmol/L (ref 20–32)
Calcium: 8.9 mg/dL (ref 8.6–10.4)
Chloride: 104 mmol/L (ref 98–110)
Creat: 0.99 mg/dL — ABNORMAL HIGH (ref 0.60–0.93)
GFR, Est African American: 65 mL/min/{1.73_m2} (ref >=60)
GFR, Est Non African American: 56 mL/min/{1.73_m2} — ABNORMAL LOW (ref >=60)
Globulin: 1.9 g/dL (calc) (ref 1.9–3.7)
Glucose, Bld: 99 mg/dL (ref 65–99)
Potassium: 4.1 mmol/L (ref 3.5–5.3)
Sodium: 137 mmol/L (ref 135–146)
Total Bilirubin: 0.8 mg/dL (ref 0.2–1.2)
Total Protein: 6.1 g/dL (ref 6.1–8.1)

## 2017-10-22 ENCOUNTER — Telehealth: Payer: Self-pay | Admitting: Neurology

## 2017-10-22 NOTE — Progress Notes (Signed)
Office Visit Note  Patient: Michele Bryan             Date of Birth: Mar 18, 1940           MRN: [id number]             PCP: Sigmund Hazel, MD Referring: Sigmund Hazel, MD Visit Date: 10/29/2017 Occupation: @GUAROCC @  Subjective:  Pain in multiple joints   History of Present Illness: Michele Bryan is a 77 y.o. female with history of seropositive rheumatoid arthritis, osteopenia, and osteoarthritis.  She is taking Plaquenil 200 mg BID M-F, MTX 6 tablets by mouth once a week, and folic acid 1 mg po daily. She has not noticed any benefit since restarting on these medications.  She reports she is having pain in multiple joints including both hands, wrists, left knee, ankles, and feet. She states she is having swelling in hands and feet.  She reports increased joint stiffness.  Her right knee replacement is doing well. She reports she saw Dr. 66 today for follow up of left shoulder replacement.  She denies any complications and states the x-rays looked good today. She states she will be started physical therapy on 11/09/17 for management of neck pain and stiffness.    She takes Fosamax 70 mg po once weekly and calcium and vitamin D daily for management of osteopenia.      Activities of Daily Living:  Patient reports morning stiffness for several hours.   Patient Reports nocturnal pain.  Difficulty dressing/grooming: Denies Difficulty climbing stairs: Denies Difficulty getting out of chair: Reports Difficulty using hands for taps, buttons, cutlery, and/or writing: Reports  Review of Systems  Constitutional: Negative for fatigue.  HENT: Negative for mouth sores, trouble swallowing, trouble swallowing, mouth dryness and nose dryness.   Eyes: Negative for pain, itching, visual disturbance and dryness.  Respiratory: Negative for cough, hemoptysis, shortness of breath and difficulty breathing.   Cardiovascular: Negative for chest pain, palpitations, hypertension and swelling in legs/feet.    Gastrointestinal: Negative for abdominal pain, blood in stool, constipation and diarrhea.  Endocrine: Negative for increased urination.  Genitourinary: Negative for painful urination and pelvic pain.  Musculoskeletal: Positive for arthralgias, joint pain and morning stiffness. Negative for joint swelling, myalgias, muscle weakness, muscle tenderness and myalgias.  Skin: Negative for color change, pallor, rash, hair loss, nodules/bumps, skin tightness, ulcers and sensitivity to sunlight.  Allergic/Immunologic: Negative for susceptible to infections.  Neurological: Positive for memory loss. Negative for dizziness, light-headedness, numbness, headaches and weakness.  Hematological: Negative for swollen glands.  Psychiatric/Behavioral: Negative for depressed mood, confusion and sleep disturbance. The patient is not nervous/anxious.     PMFS History:  Patient Active Problem List   Diagnosis Date Noted  . Left cervical radiculopathy 09/29/2017  . 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 High risk medication use 05/06/2016  . Primary osteoarthritis of both hands 05/06/2016  . Primary osteoarthritis of left knee 05/06/2016  . Primary osteoarthritis of both feet 05/06/2016  . Dyslipidemia 05/06/2016  . Peripheral neuropathy 05/06/2016  . Diverticulosis of intestine without bleeding 05/06/2016  . Osteopenia  05/06/2016  . Vitamin D deficiency 05/06/2016  . 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 History of GI bleed 05/06/2016  . Cataract of both eyes 05/06/2016  . Urinary urgency 04/30/2015  . Chronic constipation 04/30/2015  . Multiple sclerosis (HCC) 03/23/2013  . Expected blood loss anemia 06/16/2012  .  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 Anemia   . Arthritis   . GERD (gastroesophageal reflux disease)   . History of blood  transfusion   . Hyperlipidemia   . MS (multiple sclerosis) (HCC)   . Neuromuscular disorder (HCC)    multiple scleroosis/peripheral neuropathy  . PONV (postoperative nausea and vomiting)   . Rheumatoid arthritis (HCC)   . Ulcer   . Vitamin D deficiency     Family History  Problem Relation Age of Onset  . Heart attack Father    Past Surgical History:  Procedure Laterality Date  . breast augumentation    . BREAST SURGERY     biopsy  . COLONOSCOPY    . EYE SURGERY     both eys  . KNEE ARTHROSCOPY Right   . REVERSE SHOULDER ARTHROPLASTY Left 05/15/2017   Procedure: LEFT REVERSE SHOULDER ARTHROPLASTY;  Surgeon: Beverely Low, MD;  Location: [facility] OR;  Service: Orthopedics;  Laterality: Left;  . THYROID LOBECTOMY    . TOTAL KNEE ARTHROPLASTY Right 06/15/2012   Procedure: RIGHT TOTAL KNEE ARTHROPLASTY;  Surgeon: [name], MD;  Location: [facility] ORS;  Service: Orthopedics;  Laterality: Right;  . WRIST SURGERY Left    Social History   Social History Narrative   Patient lives at home with her husband [name]).   Retired.   Education- college.   Caffeine- Two cups of decaf tea daily.   Right handed.          Objective: Vital Signs: BP 138/79 (BP Location: Right Arm, Patient Position: Sitting, Cuff Size: Normal)   Pulse 64   Resp 12   Ht 5\' 4"  (1.626 m)   Wt 128 lb 9.6 oz (58.3 kg)   BMI 22.07 kg/m    Physical Exam  Constitutional: She is oriented to person, place, and time. She appears well-developed and well-nourished.  HENT:  Head: Normocephalic and atraumatic.  Eyes: Conjunctivae and EOM are normal.  Neck: Normal range of motion.  Cardiovascular: Normal rate, regular rhythm, normal heart sounds and intact distal pulses.  Pulmonary/Chest: Effort normal and breath sounds normal.  Abdominal: Soft. Bowel sounds are normal.  Lymphadenopathy:    She has no cervical adenopathy.  Neurological: She is alert and oriented to person, place, and time.  Skin: Skin is warm and dry.  Capillary refill takes less than 2 seconds.  Psychiatric: She has a normal mood and affect. Her behavior is normal.  Nursing note and vitals reviewed.    Musculoskeletal Exam:   CDAI Exam: CDAI Score: 24.6  Patient Global Assessment: 8 (mm); Provider Global Assessment: 8 (mm) Swollen: 14 ; Tender: 15  Joint Exam      Right  Left  Wrist  Swollen Tender     MCP 2  Swollen Tender  Swollen Tender  MCP 3     Swollen Tender  MCP 4     Swollen Tender  MCP 5  Swollen Tender     PIP 2  Swollen Tender  Swollen Tender  PIP 3  Swollen Tender  Swollen Tender  PIP 4  Swollen Tender     Knee      Tender  Ankle  Swollen Tender     MTP 1  Swollen Tender     MTP 2  Swollen Tender        Investigation: No additional findings.  Imaging: Mr Cervical Spine Wo Contrast  Result Date: 10/22/2017 [facility] NEUROIMAGING REPORT STUDY  DATE: 10/21/17 PATIENT NAME: RASHMI STURDEVANT DOB: 22-Jul-1940 MRN: [id number] ORDERING CLINICIAN: Levert Feinstein, MD PhD CLINICAL HISTORY: 77 year old female with left neck and arm pain. EXAM: MRI cervical spine (without) TECHNIQUE: MRI of the cervical spine was obtained utilizing 3 mm sagittal slices from the posterior fossa down to the T3-4 level with T1, T2 and inversion recovery views. In addition 4 mm axial slices from C2-3 down to T1-2 level were included with T2 and gradient echo views. CONTRAST: no COMPARISON: none IMAGING SITE: [facility] [address] (1.5 Tesla MRI)  FINDINGS: On sagittal views the vertebral bodies have normal height and alignment.  Mild degenerative spondylosis and disc bulging from C4-5 down to C7-T1.  Slight anterior subluxation of C5 on C6 measuring 2 mm.  Decreased intervertebral disc space height at C5-6.  The spinal cord is normal in size and appearance. The posterior fossa, pituitary gland and paraspinal soft tissues are unremarkable.  On axial views no spinal stenosis or foraminal narrowing. Limited views of the  soft tissues of the head and neck are unremarkable.   MRI cervical spine (without) demonstrating: - Minimal degenerative changes from C4-5 down to C7-T1. No spinal stenosis or foraminal narrowing. - No intrinsic or compressive spinal cord lesions. INTERPRETING PHYSICIAN: Suanne Marker, MD Certified in Neurology, Neurophysiology and Neuroimaging [facility] [address] [place], [place] [address] [phone]    Recent Labs: Lab Results  Component Value Date   WBC 5.4 10/21/2017   HGB 10.8 (L) 10/21/2017   PLT 230 10/21/2017   NA 137 10/21/2017   K 4.1 10/21/2017   CL 104 10/21/2017   CO2 24 10/21/2017   GLUCOSE 99 10/21/2017   BUN 15 10/21/2017   CREATININE 0.99 (H) 10/21/2017   BILITOT 0.8 10/21/2017   ALKPHOS 52 10/09/2016   AST 15 10/21/2017   ALT 9 10/21/2017   PROT 6.1 10/21/2017   ALBUMIN 4.5 10/09/2016   CALCIUM 8.9 10/21/2017   GFRAA 65 10/21/2017    Speciality Comments: PLQ Eye Exam: 07/30/17 WNL @ [facility]  Procedures:  No procedures performed Allergies: Codeine and Demerol [meperidine]   Assessment / Plan:     Visit Diagnoses: 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  - RF+, CCP-: She continues to have synovitis as described above.  She continues to flare.  She has not noticed any benefit since starting on Plaquenil or methotrexate.  She has not missed any doses of Plaquenil and methotrexate.  She continues to have pain in multiple joints including bilateral hands, bilateral wrists, left knee joint, bilateral ankles and bilateral feet.  We were unable to use anti-TNF since she has a history of multiple sclerosis.  We discussed the indications, contraindications, and potential side effects of Orencia.  Consent was obtained.  She will follow-up in the office in 3 months.  Medication counseling:  TB Gold:Pending Hepatitis panel: August 2015 HIV: negative 03/19/17  SPEP:  Pending Immunoglobulins: WNL 03/19/17  Does patient have a diagnosis of COPD? No  Counseled patient that Dub Amis is a selective T-cell costimulation blocker indicated for rheumatoid arthritis.  Counseled patient on purpose, proper use, and adverse effects of Orencia. The most common adverse effects are increased risk of infections, headache, and infusion reactions.  There is the possibility of an increased risk of malignancy but it is not well understood if this increased risk is due to the medication or the disease state.  Reviewed the importance of  regular labs while on Orencia therapy.  Counseled patient that Dub Amis should be held prior to scheduled surgery.  Counseled patient to avoid live vaccines while on Orencia.  Advised patient to get annual influenza vaccine and the pneumococcal vaccine as indicated.  Provided patient with medication education material and answered all questions.  Patient consented to [facility].  Will upload consent into patient's chart.  Will submit benefit's investigation for Orencia.   High risk medication use - PLQ 200 mg po bid M-F, MTX 6 tabs po q wk, folic acid 1 mg po qd. CBC and CMP were stable on 10/01/2017.  H/O total shoulder replacement, left: Limited range of motion.  She followed up with Dr. Devonne Doughty today.  She had x-rays today with normal alignment per patient.   Primary osteoarthritis of both hands: She has PIP and DIP synovial thickening consistent with osteoarthritis of bilateral hands.  She has bilateral CMC joint synovial thickening.  She is in complete fist formation bilaterally.  Joint protection and muscle strengthening were discussed.  Status post right knee replacement: Doing well.  Good range of motion.  She has no discomfort at this time.  No warmth or effusion is noted.  Primary osteoarthritis of left knee: She has warmth and tenderness on exam.  She is limited range of motion.  She knows she needs knee replacement but is not ready to proceed with  surgery.  Primary osteoarthritis of both feet: She has osteoarthritic changes in bilateral feet.  She continues to have significant pain in both feet.  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  She has midline spinal tenderness in the thoracic region.  Osteopenia of multiple sites - Patient reports her bone densities have been stable. She is on Fosamax 70 mg weekly per her PCP.  She takes calcium and vitamin D daily.   History of multiple sclerosis: She is unable to take anti-TNFs.   Other medical conditions are listed as follows:   History of diverticulosis  Dyslipidemia  History of vitamin D deficiency  History of GI bleed  History of peripheral neuropathy  History of cataract   Orders: No orders of the defined types were placed in this encounter.  No orders of the defined types were placed in this encounter.   Face-to-face time spent with patient was 30 minutes. Greater than 50% of time was spent in counseling and coordination of care.  Follow-Up Instructions: Return in about 3 months (around 01/29/2018) for Rheumatoid arthritis, Osteoarthritis.   Gearldine Bienenstock, PA-C   I examined and evaluated the patient with Sherron Ales PA.  Patient has synovitis in multiple joints on my examination today.  She has not been taking methotrexate and Plaquenil without adequate results.  She also believes that Plaquenil is contributing to nausea.  She will discontinue Plaquenil.  We had detailed discussion regarding different treatment options.  As she has a history of multiple sclerosis we will apply for Orencia.  Once approved she will start on Orencia.  The plan of care was discussed as noted above.  Pollyann Savoy, MD  Note - This record has been created using Animal nutritionist.  Chart creation errors have been sought, but may not always  have been located. Such creation errors do not reflect on  the standard of medical care.

## 2017-10-22 NOTE — Telephone Encounter (Signed)
Please call patient, MRI of the cervical spine showed mild degenerative changes there was no significant canal or foraminal narrowing   IMPRESSION:   MRI cervical spine (without) demonstrating: - Minimal degenerative changes from C4-5 down to C7-T1. No spinal stenosis or foraminal narrowing.  - No intrinsic or compressive spinal cord lesions.

## 2017-10-22 NOTE — Progress Notes (Signed)
stable °

## 2017-10-22 NOTE — Telephone Encounter (Signed)
Spoke to patient and she is aware of results °

## 2017-10-27 NOTE — Patient Instructions (Addendum)
Standing Labs We placed an order today for your standing lab work.    Please come back and get your standing labs the end of November and then every 3 months.  We have open lab Monday through Friday from 8:30-11:30 AM and 1:30-4:00 PM  at the office of Dr. Pollyann Savoy.   You may experience shorter wait times on Monday and Friday afternoons. The office is located at [address], [place], [place] [address] No appointment is necessary.   Labs are drawn by [facility].  You may receive a bill from [facility] for your lab work. If you have any questions regarding directions or hours of operation,  please call [phone].    Vaccines You are taking a medication(s) that can suppress your immune system.  The following immunizations are recommended:  . Flu annually . Pneumonia . Shingles . Hepatitis B  Please check with your PCP to make sure you are up to date.

## 2017-10-29 ENCOUNTER — Encounter: Payer: Self-pay | Admitting: Physician Assistant

## 2017-10-29 ENCOUNTER — Telehealth: Payer: Self-pay | Admitting: Pharmacy Technician

## 2017-10-29 ENCOUNTER — Ambulatory Visit: Payer: Medicare Other | Admitting: Rheumatology

## 2017-10-29 VITALS — BP 138/79 | HR 64 | Resp 12 | Ht 64.0 in | Wt 128.6 lb

## 2017-10-29 DIAGNOSIS — Z8639 Personal history of other endocrine, nutritional and metabolic disease: Secondary | ICD-10-CM

## 2017-10-29 DIAGNOSIS — M0579 Rheumatoid arthritis with rheumatoid factor of multiple sites without organ or systems involvement: Secondary | ICD-10-CM | POA: Diagnosis not present

## 2017-10-29 DIAGNOSIS — M4004 Postural kyphosis, thoracic region: Secondary | ICD-10-CM

## 2017-10-29 DIAGNOSIS — Z96651 Presence of right artificial knee joint: Secondary | ICD-10-CM

## 2017-10-29 DIAGNOSIS — Z8719 Personal history of other diseases of the digestive system: Secondary | ICD-10-CM

## 2017-10-29 DIAGNOSIS — G35 Multiple sclerosis: Secondary | ICD-10-CM

## 2017-10-29 DIAGNOSIS — M19041 Primary osteoarthritis, right hand: Secondary | ICD-10-CM

## 2017-10-29 DIAGNOSIS — M8589 Other specified disorders of bone density and structure, multiple sites: Secondary | ICD-10-CM

## 2017-10-29 DIAGNOSIS — M1712 Unilateral primary osteoarthritis, left knee: Secondary | ICD-10-CM

## 2017-10-29 DIAGNOSIS — Z79899 Other long term (current) drug therapy: Secondary | ICD-10-CM | POA: Diagnosis not present

## 2017-10-29 DIAGNOSIS — E785 Hyperlipidemia, unspecified: Secondary | ICD-10-CM

## 2017-10-29 DIAGNOSIS — Z96612 Presence of left artificial shoulder joint: Secondary | ICD-10-CM | POA: Diagnosis not present

## 2017-10-29 DIAGNOSIS — Z8669 Personal history of other diseases of the nervous system and sense organs: Secondary | ICD-10-CM

## 2017-10-29 DIAGNOSIS — M19072 Primary osteoarthritis, left ankle and foot: Secondary | ICD-10-CM

## 2017-10-29 DIAGNOSIS — M19042 Primary osteoarthritis, left hand: Secondary | ICD-10-CM

## 2017-10-29 DIAGNOSIS — M19071 Primary osteoarthritis, right ankle and foot: Secondary | ICD-10-CM

## 2017-10-29 NOTE — Progress Notes (Signed)
Pharmacy Note  Subjective: Patient presents today to the [facility] to see [name].  Patient seen by the pharmacist for counseling on Orencia.    Objective: CBC    Component Value Date/Time   WBC 5.4 10/21/2017 1414   RBC 3.71 (L) 10/21/2017 1414   HGB 10.8 (L) 10/21/2017 1414   HCT 32.5 (L) 10/21/2017 1414   PLT 230 10/21/2017 1414   MCV 87.6 10/21/2017 1414   MCH 29.1 10/21/2017 1414   MCHC 33.2 10/21/2017 1414   RDW 14.4 10/21/2017 1414   LYMPHSABS 1,712 10/21/2017 1414   MONOABS 496 10/09/2016 1453   EOSABS 130 10/21/2017 1414   BASOSABS 32 10/21/2017 1414    CMP     Component Value Date/Time   NA 137 10/21/2017 1414   K 4.1 10/21/2017 1414   CL 104 10/21/2017 1414   CO2 24 10/21/2017 1414   GLUCOSE 99 10/21/2017 1414   BUN 15 10/21/2017 1414   CREATININE 0.99 (H) 10/21/2017 1414   CALCIUM 8.9 10/21/2017 1414   PROT 6.1 10/21/2017 1414   ALBUMIN 4.5 10/09/2016 1453   AST 15 10/21/2017 1414   ALT 9 10/21/2017 1414   ALKPHOS 52 10/09/2016 1453   BILITOT 0.8 10/21/2017 1414   GFRNONAA 56 (L) 10/21/2017 1414   GFRAA 65 10/21/2017 1414    TB Gold:pending Hepatitis panel: negative August 2015 HIV: negative 03/19/17 SPEP: pending Immunoglobulins: Within normal limits 03/19/17  Chest X-Ray: No active disease 03/19/17  Does patient have a diagnosis of COPD? No  Assessment/Plan:  Counseled patient that Dub Amis is a selective T-cell costimulation blocker indicated for both Rheumatoid Arthritis and Multiple Sclerosis.  Counseled patient on purpose, proper use, and adverse effects of Orencia. The most common adverse effects are increased risk of infections, headache, and infusion/injection reactions.  There is the possibility of an increased risk of malignancy but it is not well understood if this increased risk is due to the medication or the disease state.  Reviewed the importance of regular labs while on Orencia therapy.  Counseled patient that  Dub Amis should be held prior to scheduled surgery.  Counseled patient to avoid live vaccines while on Orencia.  Advised patient to get annual influenza vaccine and the pneumococcal vaccine as indicated.  Provided patient with medication education material and answered all questions.  Patient consented to [facility].  Will upload consent into patient's chart.  Will submit benefit's investigation for Orencia.  Patient reported feeling nauseous in the morning after taking her Plaquenil dose.  She has tried taking it with saltines ginger tea and flat ginger ale which has not helped.  She is concerned about weight gain by eating more meals to try and prevent nausea.  Spoke with [name] and advised patient to stop taking Plaquenil.  Patient verbalized understanding.  All questions encouraged and answered.  We will follow-up with patient about prior authorization for Orencia.  Verlin Fester, PharmD, [facility] Rheumatology Clinical Pharmacist  10/29/2017 11:53 AM

## 2017-10-29 NOTE — Telephone Encounter (Signed)
Called patient to let her know Orencia [place] was approved and that there is a $100 copay. We can apply for [employer]. Patient said she is interested. Advised patient she will need to provide income documents (tax documents/ social security, etc) and prescription out of pocket for the year for everyone in the household. Patient will discuss with husband and will call me back.  Also advised patient she will need additional labs drawn before starting Orencia. She will come by office tomorrow afternoon.  1:43 PM Dorthula Nettles, CPhT

## 2017-10-29 NOTE — Telephone Encounter (Signed)
Received a fax from [facility] regarding a prior authorization for Orencia [place]. Authorization has been APPROVED from 10/29/17 to 03/02/2018.   Will send document to scan center.  Authorization # [id number] Phone # [phone]  Please send in prescription to pharmacy once labs have been recieved.  12:07 PM Dorthula Nettles, CPhT

## 2017-10-29 NOTE — Telephone Encounter (Signed)
Received a Prior Authorization request from [employer] for [employer]. Authorization has been submitted to patient's insurance via Cover My Meds. Will update once we receive a response.  11:56 AM Dorthula Nettles, CPhT

## 2017-10-30 ENCOUNTER — Telehealth: Payer: Self-pay | Admitting: Pharmacy Technician

## 2017-10-30 ENCOUNTER — Other Ambulatory Visit: Payer: Self-pay

## 2017-10-30 ENCOUNTER — Other Ambulatory Visit: Payer: Self-pay | Admitting: Rheumatology

## 2017-10-30 DIAGNOSIS — Z79899 Other long term (current) drug therapy: Secondary | ICD-10-CM

## 2017-10-30 NOTE — Telephone Encounter (Signed)
Last visit: 10/29/17 Next visit: 02/19/18 Labs: 10/21/17 Stable  Okay to refill per Dr. Antony Odea

## 2017-10-30 NOTE — Telephone Encounter (Signed)
Left message for patient to see if she was still coming by office today to get labs drawn. If so, going to see if she can arrive by 3:30pm.  1:53 PM Dorthula Nettles, CPhT

## 2017-11-03 LAB — PROTEIN ELECTROPHORESIS, SERUM, WITH REFLEX
Albumin ELP: 4.3 g/dL (ref 3.8–4.8)
Alpha 1: 0.3 g/dL (ref 0.2–0.3)
Alpha 2: 0.6 g/dL (ref 0.5–0.9)
Beta 2: 0.2 g/dL (ref 0.2–0.5)
Beta Globulin: 0.4 g/dL (ref 0.4–0.6)
Gamma Globulin: 0.9 g/dL (ref 0.8–1.7)
Total Protein: 6.7 g/dL (ref 6.1–8.1)

## 2017-11-03 LAB — QUANTIFERON-TB GOLD PLUS
Mitogen-NIL: >10 IU/mL
NIL: 0.09 IU/mL
QuantiFERON-TB Gold Plus: NEGATIVE
TB1-NIL: 0.01 IU/mL
TB2-NIL: 0.02 IU/mL

## 2017-11-03 NOTE — Progress Notes (Signed)
SPEP  normal.

## 2017-11-03 NOTE — Progress Notes (Signed)
TB gold negative

## 2017-11-05 ENCOUNTER — Telehealth: Payer: Self-pay | Admitting: Rheumatology

## 2017-11-05 NOTE — Telephone Encounter (Signed)
Patient called stating she read that she is not suppose to get a flu vaccine if she is taking a "biological medication" and patient states she will be starting Orencia.  Patient requested a return call.

## 2017-11-05 NOTE — Telephone Encounter (Signed)
I spoke with patient, and advised her to proceed with the IM influenza vaccine.  She was advised to avoid all live vaccinations.  All questions were addressed.

## 2017-11-05 NOTE — Telephone Encounter (Signed)
Left message for patient to see if she wanted to proceed with applying for patient assistance for Orencia [place]. $100 copay.   10:48 AM Dorthula Nettles, CPhT

## 2017-11-12 NOTE — Telephone Encounter (Signed)
Spoke to patient, she will gather documents needed for Patient Assistance and will bring them by the office. She will also need to sign the application. Will follow up.  10:38 AM Michele Bryan, CPhT

## 2017-11-20 NOTE — Telephone Encounter (Signed)
Spoke to patient she will speak to husband about required documents and will either email or mail copies to the office.

## 2017-11-20 NOTE — Telephone Encounter (Signed)
Left message for patient to follow up on income and prescription out of pocket documents needed for Patient Assistance application.

## 2017-11-25 ENCOUNTER — Encounter: Payer: Self-pay | Admitting: Neurology

## 2017-11-25 ENCOUNTER — Ambulatory Visit: Payer: Medicare Other | Admitting: Neurology

## 2017-11-25 VITALS — BP 155/82 | HR 63 | Ht 64.0 in | Wt 130.2 lb

## 2017-11-25 DIAGNOSIS — M25512 Pain in left shoulder: Secondary | ICD-10-CM | POA: Diagnosis not present

## 2017-11-25 DIAGNOSIS — G35 Multiple sclerosis: Secondary | ICD-10-CM

## 2017-11-25 DIAGNOSIS — G8929 Other chronic pain: Secondary | ICD-10-CM | POA: Diagnosis not present

## 2017-11-25 MED ORDER — ONDANSETRON 4 MG PO TBDP: 4.0000 mg | ORAL_TABLET | Freq: Three times a day (TID) | ORAL | 6 refills | Status: DC | PRN

## 2017-11-25 NOTE — Telephone Encounter (Signed)
Patient mailed in income documents and Prescription out of pocket. Faxed to [email or web address]. Awaiting response.  [phone]  3:41 PM Dorthula Nettles, CPhT

## 2017-11-25 NOTE — Progress Notes (Signed)
Chief Complaint  Patient presents with  . Multiple Sclerosis    No new concerns today.  . Neck Pain    She would like to review her recent cervical MRI.  She has started physical therapy.  Gabapentin 100mg , TID PRN is helpful for her pain.      History of Present Illness   Michele Bryan is a 77 years old right-handed Caucasian female, followup for relapsing remitting multiple sclerosis, she was previously patients of Dr. love, last clinical visit was in Feb 2016. She presented with numbness from neck down, double vision, blurry vision in 1971, gait difficulty, ambulate with a walker at its worst, her symptoms gradually improved with steroid treatment, She was diagnosed with multiple sclerosis, but was never offered any immunomodulation therapies. Over years, she had a few flareups of acute onset vertigo, double vision, with worsening gait difficulty,   the most recent one was in December 2014, lasting for 2 week, improved by [employer]. Previous: MRI study of the brain per record was abnormal showing evidence of long T2 signal changes in the supraventricular and  periventricular region of the cerebral hemispheres,  MRI cervical spine 2001 showed a lesion at C2. C3 She has a long history of B12 deficiency, treated with B12 shots in the past, on sublingual.  She has bladder incontinence and urinary tract infections as often as every 6 weeks and is followed by Dr. Jethro Bolus. She is a retired Comptroller, still active, independent in her daily activity, driving without difficulty, exercise regularly, she has mild unsteady gait which has been fairly stable over the years Laboratory evaluation January 2015 showed normal CMP, CBC, and uric acid, rheumatoid factor was elevated 23, negative ANA, she was recently diagnosed with rheumatoid arthritis, she is now treated with plaquenil  Feb 26th 2016 visit: She had GI bleeding in summer of 2015, she is no longer taking NSAIDs,  She still drives, ambulate  without a cane, urinary urgency, wearing pads, practice TaiGi, Yoga regularly, ride stationary bike, likes to read,  She lives at home, rheumatoid arthritis, taking Plaquenil, vitamin B12 deficiency, by mouth B12 supplement,  Apr 30 2015 visit  She still drives, no gait difficulty, she did trip and fall in Jan 2017, with left wrist fracture, has to have left wrist surgery, followed by OT,  She has mild urinary urgency, mild constipation, mild blurry vision, she has no memory loss, enjoys reading   UPDATE September 29 2017: She had left shoulder replacement in March 2019, recovered well, she now complains of 6 weeks history of radiating pain from left neck to left shoulder, constant, unbearable sometimes, she takes frequent Tylenol.  She had MRI of the brain for complaints of 2 months history of left visual field deficit, we have personally reviewed films on August 05, 2017, MRI of the brain showed generalized atrophy, bilateral supratentorium demonstrates subcortical oval-shaped periventricular white matter changes, compatible with chronic demyelinating disease, versus small vessel disease, MRA of the brain and neck showed no significant large vessel disease, She has significant deformity of bilateral hands joints, has rheumatoid arthritis  UPDATE Sept 25 2019: She continue complains of left-sided neck pain, shoulder pain, personally reviewed MRI of cervical spine in August 2019, only mild degenerative changes, there was no significant foraminal or canal stenosis. She complains of frequent nausea, but has good appetite, gained some weight,  Review of systems 14 system review of system was performed and notable only for nausea, frequent awakening, frequent urination, incontinence of bladder, urgency, joint pain,  bruise easily  ALLERGIES: Allergies  Allergen Reactions  . Codeine Nausea And Vomiting  . Demerol [Meperidine] Nausea And Vomiting    HOME MEDICATIONS: Outpatient Medications Prior to Visit   Medication Sig Dispense Refill  . acetaminophen (TYLENOL) 650 MG CR tablet Take 650 mg by mouth every 8 (eight) hours as needed for pain.    Marland Kitchen alendronate (FOSAMAX) 70 MG tablet Take 70 mg by mouth every Tuesday.     . cholecalciferol (VITAMIN D) 1000 units tablet Take 1,000 Units by mouth daily.     . ferrous sulfate 325 (65 FE) MG EC tablet Take 1 tablet (325 mg total) by mouth 2 (two) times daily with a meal. 60 tablet 0  . folic acid (FOLVITE) 1 MG tablet Take 1 mg by mouth daily.    . hydroxychloroquine (PLAQUENIL) 200 MG tablet Take 1 tablet by mouth twice daily Monday-Friday. 120 tablet 0  . Krill Oil 500 MG CAPS Take 500 mg by mouth daily.    . Magnesium 250 MG TABS Take 250 mg by mouth daily.    . methotrexate (RHEUMATREX) 2.5 MG tablet TAKE 3 TABLETS BY MOUTH ONCE A WEEK **CAUTION :CHEMOTHERAPHY  PROTECT FROM LIGHT** (Patient taking differently: take 4 pills weekly) 12 tablet 1  . Misc Natural Products (CYSTEX) LIQD Take 15 mLs by mouth daily.    . Multiple Vitamin (MULTIVITAMIN WITH MINERALS) TABS Take 1 tablet by mouth daily.    Marland Kitchen omeprazole (PRILOSEC) 40 MG capsule Take 40 mg by mouth daily before breakfast.     . ondansetron (ZOFRAN) 4 MG tablet Take 1 tablet (4 mg total) by mouth every 8 (eight) hours as needed for nausea or vomiting. 20 tablet 0  . OVER THE COUNTER MEDICATION Apply 1 application topically 4 (four) times daily as needed (for pain.). REAL TIME PAIN RELIEF HEMP OIL    . vitamin A 8000 UNIT capsule Take 8,000 Units by mouth daily.    . vitamin E 400 UNIT capsule Take 400 Units by mouth daily.    Marland Kitchen zolpidem (AMBIEN) 10 MG tablet Take 5 mg by mouth at bedtime as needed for sleep.     Marland Kitchen diclofenac sodium (VOLTAREN) 1 % GEL Apply 2 g topically 4 (four) times daily. (Patient not taking: Reported on 07/28/2017) 3 Tube 1  . ferrous sulfate 325 (65 FE) MG tablet ferrous sulfate 325mg  tablet  TAKE 1 TABLET TWICE A DAY WITH A MEAL    . ferrous sulfate 325 (65 FE) MG tablet  TAKE 1 TABLET TWICE A DAY WITH A MEAL  0  . Flaxseed, Linseed, (FLAXSEED OIL PO) Take 15 mLs by mouth daily.    . folic acid (FOLVITE) 1 MG tablet folic acid 1 mg tablet   1 mg by oral route.    . hydroxychloroquine (PLAQUENIL) 200 MG tablet hydroxychloroquine 200 mg tablet    . Krill Oil 500 MG CAPS krill oil 500 mg capsule   500 mg by oral route.    . methotrexate 2.5 MG tablet methotrexate sodium 2.5 mg tablet   7.5 mg by oral route.    omeprazole (PRILOSEC) 40 MG capsule omeprazole 40 mg capsule,delayed release    . traMADol (ULTRAM) 50 MG tablet Take 50 mg by mouth every 6 (six) hours as needed (for pain.).     Marland Kitchen traMADol (ULTRAM) 50 MG tablet Take 1-2 tablets (50-100 mg total) by mouth every 6 (six) hours as needed for moderate pain or severe pain (for pain.). (Patient not  taking: Reported on 07/28/2017) 40 tablet 0  . vitamin A 8000 UNIT capsule vitamin A 8,000 unit capsule   8000 units by oral route.    Marland Kitchen zolpidem (AMBIEN) 10 MG tablet zolpidem 10 mg tablet   5 mg by oral route.     No facility-administered medications prior to visit.     PAST MEDICAL HISTORY: Past Medical History:  Diagnosis Date  . Anemia   . Arthritis   . GERD (gastroesophageal reflux disease)   . History of blood transfusion   . Hyperlipidemia   . MS (multiple sclerosis) (HCC)   . Neuromuscular disorder (HCC)    multiple scleroosis/peripheral neuropathy  . PONV (postoperative nausea and vomiting)   . Rheumatoid arthritis (HCC)   . Ulcer   . Vitamin D deficiency     PAST SURGICAL HISTORY: Past Surgical History:  Procedure Laterality Date  . breast augumentation    . BREAST SURGERY     biopsy  . COLONOSCOPY    . EYE SURGERY     both eys  . KNEE ARTHROSCOPY Right   . REVERSE SHOULDER ARTHROPLASTY Left 05/15/2017   Procedure: LEFT REVERSE SHOULDER ARTHROPLASTY;  Surgeon: Beverely Low, MD;  Location: [facility] OR;  Service: Orthopedics;  Laterality: Left;  . THYROID LOBECTOMY    . TOTAL KNEE  ARTHROPLASTY Right 06/15/2012   Procedure: RIGHT TOTAL KNEE ARTHROPLASTY;  Surgeon: [name], MD;  Location: [facility] ORS;  Service: Orthopedics;  Laterality: Right;  . WRIST SURGERY Left     FAMILY HISTORY: Family History  Problem Relation Age of Onset  . Heart attack Father     SOCIAL HISTORY:  Social History   Socioeconomic History  . Marital status: Married    Spouse name: [name]  . Number of children: 3  . Years of education: college  . Highest education level: Not on file  Occupational History    Comment: retired  Engineer, production  . Financial resource strain: Not on file  . Food insecurity:    Worry: Not on file    Inability: Not on file  . Transportation needs:    Medical: Not on file    Non-medical: Not on file  Tobacco Use  . Smoking status: Never Smoker  . Smokeless tobacco: Never Used  Substance and Sexual Activity  . Alcohol use: No  . Drug use: Never  . Sexual activity: Not on file  Lifestyle  . Physical activity:    Days per week: Not on file    Minutes per session: Not on file  . Stress: Not on file  Relationships  . Social connections:    Talks on phone: Not on file    Gets together: Not on file    Attends religious service: Not on file    Active member of club or organization: Not on file    Attends meetings of clubs or organizations: Not on file    Relationship status: Not on file  . Intimate partner violence:    Fear of current or ex partner: Not on file    Emotionally abused: Not on file    Physically abused: Not on file    Forced sexual activity: Not on file  Other Topics Concern  . Not on file  Social History Narrative   Patient lives at home with her husband [name]).   Retired.   Education- college.   Caffeine- Two cups of decaf tea daily.   Right handed.  PHYSICAL EXAM   Vitals:   11/25/17 0748  BP: (!) 155/82  Pulse: 63  Weight: 130 lb 4 oz (59.1 kg)  Height: 5\' 4"  (1.626 m)    Not recorded      Body mass  index is 22.36 kg/m.  PHYSICAL EXAMNIATION:  Gen: NAD, conversant, well nourised, obese, well groomed                     Cardiovascular: Regular rate rhythm, no peripheral edema, warm, nontender. Eyes: Conjunctivae clear without exudates or hemorrhage Neck: Supple, no carotid bruise. Pulmonary: Clear to auscultation bilaterally   NEUROLOGICAL EXAM:  MENTAL STATUS: Speech:    Speech is normal; fluent and spontaneous with normal comprehension.  Cognition:    The patient is oriented to person, place, and time;     recent and remote memory intact;     language fluent;     normal attention, concentration,     fund of knowledge.  CRANIAL NERVES: CN II: Visual fields are full to confrontation.   Pupils are 4 mm and briskly reactive to light.  CN III, IV, VI: extraocular movement are normal. No ptosis. CN V: Facial sensation is intact to pinprick in all 3 divisions bilaterally. CN VII: Face is symmetric with normal eye closure and smile. CN VIII: Hearing is normal to rubbing fingers CN IX, X: Palate elevates symmetrically. Phonation is normal. CN XI: Head turning and shoulder shrug are intact CN XII: Tongue is midline with normal movements and no atrophy.  MOTOR: There is no pronator drift of out-stretched arms. Muscle bulk and tone are normal. Deformity of bilateral hand joints due to rheumatoid arthritis  REFLEXES: Reflexes are 3 and symmetric at the biceps, triceps, knees, and ankles. Plantar responses are flexor.  SENSORY: Light touch, pinprick, position sense, and vibration sense are intact in fingers and toes.  COORDINATION: Rapid alternating movements and fine finger movements are intact. There is no dysmetria on finger-to-nose and heel-knee-shin.   GAIT/STANCE: Mildly cautious, wide-based, good arm swing, and turning. scoliosis, kyphosis  DIAGNOSTIC DATA (LABS, IMAGING, TESTING) - I reviewed patient records, labs, notes, testing and imaging myself where  available.  Lab Results  Component Value Date   WBC 5.4 10/21/2017   HGB 10.8 (L) 10/21/2017   HCT 32.5 (L) 10/21/2017   MCV 87.6 10/21/2017   PLT 230 10/21/2017      Component Value Date/Time   NA 137 10/21/2017 1414   K 4.1 10/21/2017 1414   CL 104 10/21/2017 1414   CO2 24 10/21/2017 1414   GLUCOSE 99 10/21/2017 1414   BUN 15 10/21/2017 1414   CREATININE 0.99 (H) 10/21/2017 1414   CALCIUM 8.9 10/21/2017 1414   PROT 6.7 10/30/2017 1500   ALBUMIN 4.5 10/09/2016 1453   AST 15 10/21/2017 1414   ALT 9 10/21/2017 1414   ALKPHOS 52 10/09/2016 1453   BILITOT 0.8 10/21/2017 1414   GFRNONAA 56 (L) 10/21/2017 1414   GFRAA 65 10/21/2017 1414   ASSESSMENT AND PLAN   77  years old Caucasian female Probable Relapsing Remitting Multiple Sclerosis  MRI of the brain showed stable lesions,  Has never been treated with long-term immunomodulation therapy, Rheumatoid arthritis Left neck pain radiating pain to left shoulder  No evidence of cervical radiculopathy by MRI  Improved with gabapentin, heating pad,  Continue moderate exercise     64, M.D. Ph.D.  [facility] [address] [place], [place] Phone: [phone] Fax:  [phone] 

## 2017-12-02 NOTE — Telephone Encounter (Signed)
Called to check status of application, rep stated it was not received. Refaxed application, will follow up tomorrow to confirm receipt.  9:40 AM Dorthula Nettles, CPhT

## 2017-12-03 NOTE — Telephone Encounter (Signed)
Application was received, takes 3 business days to process. Awaiting response.  Phone- [phone]  11:17 AM Dorthula Nettles, CPhT

## 2017-12-04 ENCOUNTER — Telehealth: Payer: Self-pay | Admitting: Rheumatology

## 2017-12-04 NOTE — Telephone Encounter (Signed)
Spoke to patient, she would like to move forward with Orencia and will pay the $100 copay. Please send prescription to pharmacy. Advised patient we will call to schedule nurse visit.  Per plan, patient can use [facility] or [facility].  Phone# [phone]  1:59 PM Dorthula Nettles, CPhT

## 2017-12-04 NOTE — Telephone Encounter (Signed)
Patient called stating she was returning your call.   °

## 2017-12-04 NOTE — Telephone Encounter (Signed)
.  Received fax from [facility], renewal application has been DENIED, due to patient's income. Called patient to discuss, left voicemail to call back. Copay is $100.  Will send document to [facility].  Phone# [phone] Fax# [phone]  11:35 AM Dorthula Nettles, CPhT

## 2017-12-07 ENCOUNTER — Telehealth: Payer: Self-pay | Admitting: Rheumatology

## 2017-12-07 MED ORDER — ABATACEPT 125 MG/ML ~~LOC~~ SOAJ: 125.0000 mg | SUBCUTANEOUS | 0 refills | Status: DC

## 2017-12-07 NOTE — Addendum Note (Signed)
Addended by: Verlin Fester C on: 12/07/2017 09:09 AM   Modules accepted: Orders

## 2017-12-07 NOTE — Telephone Encounter (Signed)
Patient left a voicemail stating she was returning your call.   

## 2017-12-07 NOTE — Telephone Encounter (Signed)
Sent prescription to [facility] long outpatient pharmacy.  Called patient to set up appointment for first dose visit.  Left voicemail.

## 2017-12-08 ENCOUNTER — Ambulatory Visit (INDEPENDENT_AMBULATORY_CARE_PROVIDER_SITE_OTHER): Payer: Medicare Other | Admitting: Rheumatology

## 2017-12-08 VITALS — BP 155/65 | HR 64

## 2017-12-08 DIAGNOSIS — M0579 Rheumatoid arthritis with rheumatoid factor of multiple sites without organ or systems involvement: Secondary | ICD-10-CM

## 2017-12-08 MED ORDER — ABATACEPT 125 MG/ML ~~LOC~~ SOSY: 125.0000 mg | PREFILLED_SYRINGE | SUBCUTANEOUS | 0 refills | Status: DC

## 2017-12-08 MED ORDER — ABATACEPT 125 MG/ML ~~LOC~~ SOSY
125.0000 mg | PREFILLED_SYRINGE | Freq: Once | SUBCUTANEOUS | Status: AC
Start: 2017-12-08 — End: 2017-12-08
  Administered 2017-12-08: 125 mg via SUBCUTANEOUS

## 2017-12-08 NOTE — Progress Notes (Signed)
Pharmacy Note  Subjective: Patient presents today to the [facility] to see [name].  Patient seen by the pharmacist for counseling on subcutaneous Orencia.  Patient is new to biologic medications.  Previous medication regimen included Plaquenil with mild improvement.  Dub Amis was chosen due to history of multiple sclerosis.  Objective: CBC    Component Value Date/Time   WBC 5.4 10/21/2017 1414   RBC 3.71 (L) 10/21/2017 1414   HGB 10.8 (L) 10/21/2017 1414   HCT 32.5 (L) 10/21/2017 1414   PLT 230 10/21/2017 1414   MCV 87.6 10/21/2017 1414   MCH 29.1 10/21/2017 1414   MCHC 33.2 10/21/2017 1414   RDW 14.4 10/21/2017 1414   LYMPHSABS 1,712 10/21/2017 1414   MONOABS 496 10/09/2016 1453   EOSABS 130 10/21/2017 1414   BASOSABS 32 10/21/2017 1414    CMP     Component Value Date/Time   NA 137 10/21/2017 1414   K 4.1 10/21/2017 1414   CL 104 10/21/2017 1414   CO2 24 10/21/2017 1414   GLUCOSE 99 10/21/2017 1414   BUN 15 10/21/2017 1414   CREATININE 0.99 (H) 10/21/2017 1414   CALCIUM 8.9 10/21/2017 1414   PROT 6.7 10/30/2017 1500   ALBUMIN 4.5 10/09/2016 1453   AST 15 10/21/2017 1414   ALT 9 10/21/2017 1414   ALKPHOS 52 10/09/2016 1453   BILITOT 0.8 10/21/2017 1414   GFRNONAA 56 (L) 10/21/2017 1414   GFRAA 65 10/21/2017 1414    TB Gold:pending Hepatitis panel: negative August 2015 HIV: negative 03/19/17 SPEP: pending Immunoglobulins: Within normal limits 03/19/17  Chest X-Ray: No active disease 03/19/17  Does patient have a diagnosis of COPD? No  Assessment/Plan:  Counseled patient that Dub Amis is a selective T-cell costimulation blocker indicated for Rheumatoid Arthritis.  Counseled patient on purpose, proper use, and adverse effects of Orencia. The most common adverse effects are increased risk of infections, headache, and injection site reactions.  There is the possibility of an increased risk of malignancy but it is not well understood if this  increased risk is due to the medication or the disease state.  Reviewed the importance of regular labs while on Orencia therapy.  She is to come back in 1 month for labs and then every 3 months.  Counseled patient that Dub Amis should be held prior to scheduled surgery.     Counseled patient to avoid live vaccines while on Orencia.  Advised patient to get annual influenza vaccine and the pneumococcal vaccine as indicated.  Patient has had one pneumonia vaccine and has started the shingrix dose series.  Patient will follow-up with PCP for immunization record.    Reviewed proper storage administration of Orencia clickjet.  Demonstrated proper injection technique with Orencia demo pen. Patient was not able to demonstrate proper injection technique using the teach back method. Patient had difficulty applying continual pressure to ensure proper administration of medication while pressing injection button.  She has given herself vitamin B-12 shots previously.  Decided it might be best to switch to the Orencia syringe.  Demonstrated proper injection technique with Orencia demo syringe. Patient able to demonstrate proper injection technique using the teach back method.  Patient self injected in the right upper thigh with:  Sample Medication: Orencia Syringe NDC: [phone] Lot: [id number] Expiration: October 31,2019  New prescription for Orencia syringes was sent to [facility] long outpatient pharmacy.  Was able to schedule shipment of medication for Thursday the 10th.  Patient tolerated well.  Observed for 30 mins in  office for adverse reaction and no allergic or localized reaction noted. Instructed patient to call with any questions/issues.    Verlin Fester, PharmD, [facility] Rheumatology Clinical Pharmacist  12/08/2017 11:41 AM

## 2017-12-08 NOTE — Telephone Encounter (Signed)
Ran test claim, Orencia 125mg  syringe, also goes through for $100 copay. Will need to send in new prescription, if change is needed.

## 2017-12-08 NOTE — Patient Instructions (Signed)
Standing Labs We placed an order today for your standing lab work.    Please come back and get your standing labs in 1 month then every 3 months.  We have open lab Monday through Friday from 8:30-11:30 AM and 1:30-4:00 PM  at the office of Dr. Pollyann Savoy.   You may experience shorter wait times on Monday and Friday afternoons. The office is located at [address], [address], [place], [place] [address] No appointment is necessary.   Labs are drawn by [facility].  You may receive a bill from [facility] for your lab work. If you have any questions regarding directions or hours of operation,  please call [phone].   Just as a reminder please drink plenty of water prior to coming for your lab work. Thanks!  Vaccines You are taking a medication(s) that can suppress your immune system.  The following immunizations are recommended: . Flu annually . Pneumonia (Prevnar 13 and Pneumovax 23) . Shingrix . Hepatitis B  Please check with your PCP to make sure you are up to date.

## 2017-12-10 MED FILL — ORENCIA 125 MG/ML SYRINGE: 125 | 28 days supply | Qty: 4 | Fill #0

## 2017-12-17 ENCOUNTER — Other Ambulatory Visit: Payer: Self-pay | Admitting: Family Medicine

## 2017-12-17 DIAGNOSIS — Z1231 Encounter for screening mammogram for malignant neoplasm of breast: Secondary | ICD-10-CM

## 2017-12-17 DIAGNOSIS — M810 Age-related osteoporosis without current pathological fracture: Secondary | ICD-10-CM

## 2018-01-06 MED FILL — ORENCIA 125 MG/ML SYRINGE: 125 | 28 days supply | Qty: 4 | Fill #0

## 2018-01-14 ENCOUNTER — Other Ambulatory Visit: Payer: Self-pay | Admitting: *Deleted

## 2018-01-14 DIAGNOSIS — Z79899 Other long term (current) drug therapy: Secondary | ICD-10-CM

## 2018-01-14 LAB — CBC WITH DIFFERENTIAL/PLATELET
Basophils Absolute: 49 cells/uL (ref 0–200)
Basophils Relative: 0.8 %
Eosinophils Absolute: 183 cells/uL (ref 15–500)
Eosinophils Relative: 3 %
HCT: 34.8 % — ABNORMAL LOW (ref 35.0–45.0)
Hemoglobin: 11.5 g/dL — ABNORMAL LOW (ref 11.7–15.5)
Lymphs Abs: 1818 cells/uL (ref 850–3900)
MCH: 30 pg (ref 27.0–33.0)
MCHC: 33 g/dL (ref 32.0–36.0)
MCV: 90.9 fL (ref 80.0–100.0)
MPV: 9.9 fL (ref 7.5–12.5)
Monocytes Relative: 6.2 %
Neutro Abs: 3672 cells/uL (ref 1500–7800)
Neutrophils Relative %: 60.2 %
Platelets: 267 10*3/uL (ref 140–400)
RBC: 3.83 10*6/uL (ref 3.80–5.10)
RDW: 13.4 % (ref 11.0–15.0)
Total Lymphocyte: 29.8 %
WBC mixed population: 378 cells/uL (ref 200–950)
WBC: 6.1 10*3/uL (ref 3.8–10.8)

## 2018-01-14 LAB — COMPLETE METABOLIC PANEL WITH GFR
AG Ratio: 1.8 (calc) (ref 1.0–2.5)
ALT: 12 U/L (ref 6–29)
AST: 15 U/L (ref 10–35)
Albumin: 4.2 g/dL (ref 3.6–5.1)
Alkaline phosphatase (APISO): 60 U/L (ref 33–130)
BUN: 19 mg/dL (ref 7–25)
CO2: 27 mmol/L (ref 20–32)
Calcium: 9.6 mg/dL (ref 8.6–10.4)
Chloride: 103 mmol/L (ref 98–110)
Creat: 0.79 mg/dL (ref 0.60–0.93)
GFR, Est African American: 85 mL/min/{1.73_m2} (ref >=60)
GFR, Est Non African American: 74 mL/min/{1.73_m2} (ref >=60)
Globulin: 2.4 g/dL (calc) (ref 1.9–3.7)
Glucose, Bld: 86 mg/dL (ref 65–99)
Potassium: 4.8 mmol/L (ref 3.5–5.3)
Sodium: 136 mmol/L (ref 135–146)
Total Bilirubin: 0.5 mg/dL (ref 0.2–1.2)
Total Protein: 6.6 g/dL (ref 6.1–8.1)

## 2018-01-15 NOTE — Progress Notes (Signed)
Labs are stable.

## 2018-01-18 NOTE — Progress Notes (Signed)
Office Visit Note  Patient: Michele Bryan             Date of Birth: 04-Jan-1941           MRN: [id number]             PCP: Sigmund Hazel, MD Referring: Sigmund Hazel, MD Visit Date: 02/01/2018 Occupation: @GUAROCC @  Subjective:  Pain in multiple joints.  History of Present Illness: Michele Bryan is a 77 y.o. female with history of rheumatoid arthritis.  She was started on Orencia in October.  She has been on combination therapy of methotrexate 6 tablets/week along with Orencia  injections.  She continues to have some discomfort in her joints.  She states she had left total shoulder replacement on May 15, 2017 by Dr. Debby Bud.  She states she continued to have pain and discomfort in her left shoulder.  On recent x-ray at follow-up they found that she had a humerus fracture.  She is in a sling now.  She states she will be getting bone density on December 23.  She has been taking Fosamax for osteopenia.   Activities of Daily Living:  Patient reports morning stiffness for 30 minutes.   Patient Reports nocturnal pain.  Difficulty dressing/grooming: Denies Difficulty climbing stairs: Reports Difficulty getting out of chair: Reports Difficulty using hands for taps, buttons, cutlery, and/or writing: Denies  Review of Systems  Constitutional: Positive for fatigue. Negative for night sweats, weight gain and weight loss.  HENT: Negative for mouth sores, trouble swallowing, trouble swallowing, mouth dryness and nose dryness.   Eyes: Negative for pain, redness, visual disturbance and dryness.  Respiratory: Negative for cough, shortness of breath and difficulty breathing.   Cardiovascular: Positive for hypertension. Negative for chest pain, palpitations, irregular heartbeat and swelling in legs/feet.  Gastrointestinal: Negative for blood in stool, constipation and diarrhea.  Endocrine: Negative for increased urination.  Genitourinary: Negative for vaginal dryness.  Musculoskeletal: Positive for  arthralgias, joint pain, myalgias, morning stiffness and myalgias. Negative for joint swelling, muscle weakness and muscle tenderness.  Skin: Negative for color change, rash, hair loss, skin tightness, ulcers and sensitivity to sunlight.  Allergic/Immunologic: Negative for susceptible to infections.  Neurological: Negative for dizziness, memory loss, night sweats and weakness.  Hematological: Negative for swollen glands.  Psychiatric/Behavioral: Positive for sleep disturbance. Negative for depressed mood. The patient is not nervous/anxious.     PMFS History:  Patient Active Problem List   Diagnosis Date Noted  . Chronic left shoulder pain 11/25/2017  . Left cervical radiculopathy 09/29/2017  . 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 High risk medication use 05/06/2016  . Primary osteoarthritis of both hands 05/06/2016  . Primary osteoarthritis of left knee 05/06/2016  . Primary osteoarthritis of both feet 05/06/2016  . Dyslipidemia 05/06/2016  . Peripheral neuropathy 05/06/2016  . Diverticulosis of intestine without bleeding 05/06/2016  . Osteopenia  05/06/2016  . Vitamin D deficiency 05/06/2016  . 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 History of GI bleed 05/06/2016  . Cataract of both eyes 05/06/2016  . Urinary urgency 04/30/2015  . Chronic constipation 04/30/2015  . Multiple sclerosis (HCC) 03/23/2013  . Expected blood loss anemia 06/16/2012  . 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 Anemia   . Arthritis   . GERD (gastroesophageal reflux disease)   . History of blood transfusion   .  Hyperlipidemia   . MS (multiple sclerosis) (HCC)   . Neuromuscular disorder (HCC)    multiple scleroosis/peripheral neuropathy  . PONV (postoperative nausea and vomiting)   . Rheumatoid arthritis (HCC)   . Ulcer   . Vitamin D deficiency       Family History  Problem Relation Age of Onset  . Heart attack Father    Past Surgical History:  Procedure Laterality Date  . breast augumentation    . BREAST SURGERY     biopsy  . COLONOSCOPY    . EYE SURGERY     both eys  . KNEE ARTHROSCOPY Right   . REVERSE SHOULDER ARTHROPLASTY Left 05/15/2017   Procedure: LEFT REVERSE SHOULDER ARTHROPLASTY;  Surgeon: Beverely Low, MD;  Location: [facility] OR;  Service: Orthopedics;  Laterality: Left;  . THYROID LOBECTOMY    . TOTAL KNEE ARTHROPLASTY Right 06/15/2012   Procedure: RIGHT TOTAL KNEE ARTHROPLASTY;  Surgeon: [name], MD;  Location: [facility] ORS;  Service: Orthopedics;  Laterality: Right;  . WRIST SURGERY Left    Social History   Social History Narrative   Patient lives at home with her husband Michele Bryan).   Retired.   Education- college.   Caffeine- Two cups of decaf tea daily.   Right handed.          Objective: Vital Signs: BP 139/82 (BP Location: Right Arm, Patient Position: Sitting, Cuff Size: Normal)   Pulse 67   Resp 12   Ht 5\' 4"  (1.626 m)   Wt 133 lb 12.8 oz (60.7 kg)   BMI 22.97 kg/m    Physical Exam  Constitutional: She is oriented to person, place, and time. She appears well-developed and well-nourished.  HENT:  Head: Normocephalic and atraumatic.  Eyes: Conjunctivae and EOM are normal.  Neck: Normal range of motion.  Cardiovascular: Normal rate, regular rhythm, normal heart sounds and intact distal pulses.  Pulmonary/Chest: Effort normal and breath sounds normal.  Abdominal: Soft. Bowel sounds are normal.  Lymphadenopathy:    She has no cervical adenopathy.  Neurological: She is alert and oriented to person, place, and time.  Skin: Skin is warm and dry. Capillary refill takes less than 2 seconds.  Psychiatric: She has a normal mood and affect. Her behavior is normal.  Nursing note and vitals reviewed.    Musculoskeletal Exam: C-spine good range of motion.  She has thoracic kyphosis.  Left shoulder was in  a sling due to recent fracture.  Right shoulder once was in good range of motion.  She had some MCP and PIP tenderness and synovitis as described below.  Hip joints, knee joints, ankles MTPs PIPs were in good range of motion with no synovitis.  CDAI Exam: CDAI Score: 3.7  Patient Global Assessment: 4 (mm); Provider Global Assessment: 3 (mm) Swollen: 1 ; Tender: 2  Joint Exam      Right  Left  MCP 2   Tender     PIP 3  Swollen Tender        Investigation: No additional findings.  Imaging: No results found.  Recent Labs: Lab Results  Component Value Date   WBC 6.1 01/14/2018   HGB 11.5 (L) 01/14/2018   PLT 267 01/14/2018   NA 136 01/14/2018   K 4.8 01/14/2018   CL 103 01/14/2018   CO2 27 01/14/2018   GLUCOSE 86 01/14/2018   BUN 19 01/14/2018   CREATININE 0.79 01/14/2018   BILITOT 0.5 01/14/2018   ALKPHOS 52 10/09/2016   AST 15 01/14/2018  ALT 12 01/14/2018   PROT 6.6 01/14/2018   ALBUMIN 4.5 10/09/2016   CALCIUM 9.6 01/14/2018   GFRAA 85 01/14/2018   QFTBGOLDPLUS NEGATIVE 10/30/2017    Speciality Comments: PLQ Eye Exam: 07/30/17 WNL @ [facility]  Procedures:  No procedures performed Allergies: Codeine and Demerol [meperidine]   Assessment / Plan:     Visit Diagnoses: 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  - +RF, -CCP.  Patient was started on Orencia in October.  Although she continues to have multiple arthralgias she had much improvement in her synovitis.  She has few tender joints and synovitis as described above.  High risk medication use -Current regimen includes Orencia 125 mg subq weekly started on 12/08/17,  (Plaquenil was discontinued), methotrexate 6 tablets weekly, and folic acid 1 mg daily.  Last TB gold negative on 10/30/17.  Most recent CBC/CMP stable on 01/14/18.  Next CBC/CMP due February and then every 3 months.  Standing orders in place. Recommend annual flu, Pneumovax 23, and Prevnar 13 as  indicated.   H/O total shoulder replacement, left-patient reports that she recently acquired fracture in her left shoulder without any injury.  She is in a sling currently.  She has been having a lot of discomfort in her left shoulder.  Primary osteoarthritis of both hands-she has severe osteoarthritis in her hands which continues to cause discomfort.  Status post right knee replacement-chronic pain.  Primary osteoarthritis of left knee-she is not having much discomfort in her knee joints.  Primary osteoarthritis of both feet-she has discomfort in her feet.  No synovitis was noted.  Postural kyphosis of thoracic region  Osteopenia of multiple sites-She takes Fosamax 70 mg and vitamin D supplements managed by her PCP.  Per patient bone densities have been stable..  Patient has a bone density coming up this month.  Due to recent humerus fracture she may benefit from anabolic agents like Forteo or Tymlos.  Other medical problems are listed as follows:  History of multiple sclerosis  History of diverticulosis  Dyslipidemia  History of vitamin D deficiency-patient reports she has been taking vitamin D supplement.  History of GI bleed  History of peripheral neuropathy  History of cataract   Orders: No orders of the defined types were placed in this encounter.  No orders of the defined types were placed in this encounter.   Face-to-face time spent with patient was 30 minutes. Greater than 50% of time was spent in counseling and coordination of care.  Follow-Up Instructions: Return in about 3 months (around 05/03/2018).   Pollyann Savoy, MD  Note - This record has been created using Animal nutritionist.  Chart creation errors have been sought, but may not always  have been located. Such creation errors do not reflect on  the standard of medical care.

## 2018-01-27 ENCOUNTER — Telehealth: Payer: Self-pay | Admitting: Rheumatology

## 2018-01-27 ENCOUNTER — Other Ambulatory Visit: Payer: Self-pay | Admitting: Rheumatology

## 2018-01-27 DIAGNOSIS — M0579 Rheumatoid arthritis with rheumatoid factor of multiple sites without organ or systems involvement: Secondary | ICD-10-CM

## 2018-01-27 NOTE — Telephone Encounter (Signed)
Patient advised to call PCP and have evaluation from PCP regarding high blood pressure. Patient verbalized understanding.

## 2018-01-27 NOTE — Telephone Encounter (Signed)
Last Visit: 12/08/17 Next visit: 02/01/18 Labs: 01/14/18 stable TB gold: 10/14/17 Neg   Okay to refill per [name]

## 2018-01-27 NOTE — Telephone Encounter (Signed)
Patient left a voicemail stating she took her 5th dose of Orencia yesterday and woke up this morning at 3:00 am with high blood pressure.  Patient states her blood pressure reading was 175 / 90 which she states she takes medication for but it isn't bringing it down.  Patient is not sure if she should continue taking her medication and requesting a return call ASAP.

## 2018-02-01 ENCOUNTER — Ambulatory Visit: Payer: Medicare Other | Admitting: Physician Assistant

## 2018-02-01 ENCOUNTER — Encounter: Payer: Self-pay | Admitting: Physician Assistant

## 2018-02-01 VITALS — BP 139/82 | HR 67 | Resp 12 | Ht 64.0 in | Wt 133.8 lb

## 2018-02-01 DIAGNOSIS — M19072 Primary osteoarthritis, left ankle and foot: Secondary | ICD-10-CM

## 2018-02-01 DIAGNOSIS — Z8639 Personal history of other endocrine, nutritional and metabolic disease: Secondary | ICD-10-CM

## 2018-02-01 DIAGNOSIS — G35 Multiple sclerosis: Secondary | ICD-10-CM

## 2018-02-01 DIAGNOSIS — E785 Hyperlipidemia, unspecified: Secondary | ICD-10-CM

## 2018-02-01 DIAGNOSIS — M0579 Rheumatoid arthritis with rheumatoid factor of multiple sites without organ or systems involvement: Secondary | ICD-10-CM

## 2018-02-01 DIAGNOSIS — Z8669 Personal history of other diseases of the nervous system and sense organs: Secondary | ICD-10-CM

## 2018-02-01 DIAGNOSIS — Z96612 Presence of left artificial shoulder joint: Secondary | ICD-10-CM

## 2018-02-01 DIAGNOSIS — M1712 Unilateral primary osteoarthritis, left knee: Secondary | ICD-10-CM

## 2018-02-01 DIAGNOSIS — Z79899 Other long term (current) drug therapy: Secondary | ICD-10-CM

## 2018-02-01 DIAGNOSIS — M8589 Other specified disorders of bone density and structure, multiple sites: Secondary | ICD-10-CM

## 2018-02-01 DIAGNOSIS — M19041 Primary osteoarthritis, right hand: Secondary | ICD-10-CM | POA: Diagnosis not present

## 2018-02-01 DIAGNOSIS — M19042 Primary osteoarthritis, left hand: Secondary | ICD-10-CM

## 2018-02-01 DIAGNOSIS — Z96651 Presence of right artificial knee joint: Secondary | ICD-10-CM

## 2018-02-01 DIAGNOSIS — M4004 Postural kyphosis, thoracic region: Secondary | ICD-10-CM

## 2018-02-01 DIAGNOSIS — M19071 Primary osteoarthritis, right ankle and foot: Secondary | ICD-10-CM

## 2018-02-01 DIAGNOSIS — Z8719 Personal history of other diseases of the digestive system: Secondary | ICD-10-CM

## 2018-02-01 NOTE — Patient Instructions (Signed)
Standing Labs We placed an order today for your standing lab work.    Please come back and get your standing labs in February and every 3 months  We have open lab Monday through Friday from 8:30-11:30 AM and 1:30-4:00 PM  at the office of Dr. Tyquan Carmickle.   You may experience shorter wait times on Monday and Friday afternoons. The office is located at [address], [address], [place], [place] [address] No appointment is necessary.   Labs are drawn by [facility].  You may receive a bill from [facility] for your lab work. If you have any questions regarding directions or hours of operation,  please call [phone].   Just as a reminder please drink plenty of water prior to coming for your lab work. Thanks!  

## 2018-02-03 ENCOUNTER — Other Ambulatory Visit: Payer: Self-pay | Admitting: Rheumatology

## 2018-02-03 NOTE — Telephone Encounter (Signed)
Last Visit: 02/01/18 Next visit: 05/03/18 Labs: 01/14/18 stable  Okay to refill per [name]

## 2018-02-08 MED FILL — ORENCIA 125 MG/ML SYRINGE: 125 | 28 days supply | Qty: 4 | Fill #0

## 2018-02-15 DIAGNOSIS — S42123A Displaced fracture of acromial process, unspecified shoulder, initial encounter for closed fracture: Secondary | ICD-10-CM | POA: Insufficient documentation

## 2018-02-22 ENCOUNTER — Telehealth: Payer: Self-pay | Admitting: Pharmacist

## 2018-02-22 ENCOUNTER — Ambulatory Visit
Admission: RE | Admit: 2018-02-22 | Discharge: 2018-02-22 | Disposition: A | Discharge status: Home / Self Care | Payer: Medicare Other | Source: Ambulatory Visit | Attending: Family Medicine | Admitting: Family Medicine

## 2018-02-22 DIAGNOSIS — Z1231 Encounter for screening mammogram for malignant neoplasm of breast: Secondary | ICD-10-CM

## 2018-02-22 DIAGNOSIS — M810 Age-related osteoporosis without current pathological fracture: Secondary | ICD-10-CM

## 2018-02-22 NOTE — Telephone Encounter (Signed)
[facility] Follow-Up   Called patient today to follow up regarding patient's rheumatology medication: Orencia.  She is also taking methotrexate.   Patient is tolerating medication well with no side effects.  She was seen in our office on 12/2 with improvement in synovitis and states her symptoms are stable.  Her shoulder is still bothering her and is in the process of scheduling surgery possibly sometime in January.  Advised patient to contact our office when scheduled in order to instruct when to hold Orencia and methotrexate.  Also reports recent DEXA scan.  Next labs due in February.  Patient knows to call the office with questions or concerns. Rheumatology Clinic will continue to follow.  Verlin Fester, PharmD, [facility] Clinical Pharmacist  02/22/2018 1:54 PM

## 2018-02-22 NOTE — Telephone Encounter (Signed)
Thank you for informing me.

## 2018-02-25 NOTE — Progress Notes (Signed)
Mammo normal

## 2018-03-01 NOTE — Pre-Procedure Instructions (Signed)
Taunja Brickner Brodowski  03/01/2018      [id number] - [address] [place] - [address] [place] [address] Phone: [phone] Fax: [phone]  [facility] - [place], [place] - [address] [place] [address] [place] [address] Phone: [phone] Fax: [phone]    Your procedure is scheduled on Jan. 3  Report to [facility] Admitting at 8:10 A.M.  Call this number if you have problems the morning of surgery:  [phone]   Remember:  Do not eat or drink after midnight.      Take these medicines the morning of surgery with A SIP OF WATER :             Tylenol if needed            Gabapentin (neurontin)            zofran if needed            Tramadol              7 days prior to surgery STOP taking any Aspirin (unless otherwise instructed by your surgeon), Aleve, Naproxen, Ibuprofen, Motrin, Advil, Goody's, BC's, all herbal medications, fish oil, and all vitamins.                Do not wear jewelry, make-up or nail polish.  Do not wear lotions, powders, or perfumes, or deodorant.  Do not shave 48 hours prior to surgery.  Men may shave face and neck.  Do not bring valuables to the hospital.  [facility] is not responsible for any belongings or valuables.  Contacts, dentures or bridgework may not be worn into surgery.  Leave your suitcase in the car.  After surgery it may be brought to your room.  For patients admitted to the hospital, discharge time will be determined by your treatment team.  Patients discharged the day of surgery will not be allowed to drive home.    Special instructions:   [facility]- Preparing For Surgery  Before surgery, you can play an important role. Because skin is not sterile, your skin needs to be as free of germs as possible. You can reduce the number of germs on your skin by washing with CHG (chlorahexidine gluconate) Soap before surgery.  CHG is an  antiseptic cleaner which kills germs and bonds with the skin to continue killing germs even after washing.    Oral Hygiene is also important to reduce your risk of infection.  Remember - BRUSH YOUR TEETH THE MORNING OF SURGERY WITH YOUR REGULAR TOOTHPASTE  Please do not use if you have an allergy to CHG or antibacterial soaps. If your skin becomes reddened/irritated stop using the CHG.  Do not shave (including legs and underarms) for at least 48 hours prior to first CHG shower. It is OK to shave your face.  Please follow these instructions carefully.   1. Shower the NIGHT BEFORE SURGERY and the MORNING OF SURGERY with CHG.   2. If you chose to wash your hair, wash your hair first as usual with your normal shampoo.  3. After you shampoo, rinse your hair and body thoroughly to remove the shampoo.  4. Use CHG as you would any other liquid soap. You can apply CHG directly to the skin and wash gently with a scrungie or a clean washcloth.   5. Apply the CHG Soap to your body ONLY FROM THE NECK  DOWN.  Do not use on open wounds or open sores. Avoid contact with your eyes, ears, mouth and genitals (private parts). Wash Face and genitals (private parts)  with your normal soap.  6. Wash thoroughly, paying special attention to the area where your surgery will be performed.  7. Thoroughly rinse your body with warm water from the neck down.  8. DO NOT shower/wash with your normal soap after using and rinsing off the CHG Soap.  9. Pat yourself dry with a CLEAN TOWEL.  10. Wear CLEAN PAJAMAS to bed the night before surgery, wear comfortable clothes the morning of surgery  11. Place CLEAN SHEETS on your bed the night of your first shower and DO NOT SLEEP WITH PETS.    Day of Surgery:  Do not apply any deodorants/lotions.  Please wear clean clothes to the hospital/surgery center.   Remember to brush your teeth WITH YOUR REGULAR TOOTHPASTE.    Please read over the following fact sheets that you  were given. Coughing and Deep Breathing and Surgical Site Infection Prevention

## 2018-03-02 ENCOUNTER — Other Ambulatory Visit: Payer: Self-pay

## 2018-03-02 ENCOUNTER — Ambulatory Visit: Payer: Self-pay | Admitting: Orthopedic Surgery

## 2018-03-02 ENCOUNTER — Encounter (HOSPITAL_COMMUNITY)
Admission: RE | Admit: 2018-03-02 | Discharge: 2018-03-02 | Disposition: A | Discharge status: Home / Self Care | Payer: Medicare Other | Source: Ambulatory Visit | Attending: Orthopedic Surgery | Admitting: Orthopedic Surgery

## 2018-03-02 ENCOUNTER — Encounter (HOSPITAL_COMMUNITY): Payer: Self-pay

## 2018-03-02 DIAGNOSIS — M9669 Fracture of other bone following insertion of orthopedic implant, joint prosthesis, or bone plate: Secondary | ICD-10-CM | POA: Diagnosis not present

## 2018-03-02 DIAGNOSIS — Z885 Allergy status to narcotic agent status: Secondary | ICD-10-CM | POA: Diagnosis not present

## 2018-03-02 DIAGNOSIS — E785 Hyperlipidemia, unspecified: Secondary | ICD-10-CM | POA: Diagnosis not present

## 2018-03-02 DIAGNOSIS — I10 Essential (primary) hypertension: Secondary | ICD-10-CM | POA: Diagnosis not present

## 2018-03-02 DIAGNOSIS — S42122A Displaced fracture of acromial process, left shoulder, initial encounter for closed fracture: Secondary | ICD-10-CM | POA: Insufficient documentation

## 2018-03-02 DIAGNOSIS — K219 Gastro-esophageal reflux disease without esophagitis: Secondary | ICD-10-CM | POA: Diagnosis not present

## 2018-03-02 DIAGNOSIS — X58XXXA Exposure to other specified factors, initial encounter: Secondary | ICD-10-CM

## 2018-03-02 DIAGNOSIS — G35 Multiple sclerosis: Secondary | ICD-10-CM | POA: Diagnosis not present

## 2018-03-02 DIAGNOSIS — S42192A Fracture of other part of scapula, left shoulder, initial encounter for closed fracture: Secondary | ICD-10-CM | POA: Diagnosis not present

## 2018-03-02 DIAGNOSIS — M25512 Pain in left shoulder: Secondary | ICD-10-CM | POA: Diagnosis present

## 2018-03-02 DIAGNOSIS — Z01818 Encounter for other preprocedural examination: Secondary | ICD-10-CM

## 2018-03-02 DIAGNOSIS — Z8249 Family history of ischemic heart disease and other diseases of the circulatory system: Secondary | ICD-10-CM | POA: Diagnosis not present

## 2018-03-02 DIAGNOSIS — M069 Rheumatoid arthritis, unspecified: Secondary | ICD-10-CM | POA: Diagnosis not present

## 2018-03-02 DIAGNOSIS — Z96612 Presence of left artificial shoulder joint: Secondary | ICD-10-CM | POA: Diagnosis not present

## 2018-03-02 DIAGNOSIS — Z96651 Presence of right artificial knee joint: Secondary | ICD-10-CM | POA: Diagnosis not present

## 2018-03-02 DIAGNOSIS — E559 Vitamin D deficiency, unspecified: Secondary | ICD-10-CM | POA: Diagnosis not present

## 2018-03-02 HISTORY — DX: Essential (primary) hypertension: I10

## 2018-03-02 LAB — BASIC METABOLIC PANEL
Anion gap: 8 (ref 5–15)
BUN: 18 mg/dL (ref 8–23)
CO2: 27 mmol/L (ref 22–32)
Calcium: 9.3 mg/dL (ref 8.9–10.3)
Chloride: 104 mmol/L (ref 98–111)
Creatinine, Ser: 0.91 mg/dL (ref 0.44–1.00)
GFR calc Af Amer: >60 mL/min (ref >60)
GFR calc non Af Amer: >60 mL/min (ref >60)
Glucose, Bld: 90 mg/dL (ref 70–99)
Potassium: 4.4 mmol/L (ref 3.5–5.1)
Sodium: 139 mmol/L (ref 135–145)

## 2018-03-02 LAB — CBC
HCT: 37.9 % (ref 36.0–46.0)
Hemoglobin: 11.6 g/dL — ABNORMAL LOW (ref 12.0–15.0)
MCH: 29.3 pg (ref 26.0–34.0)
MCHC: 30.6 g/dL (ref 30.0–36.0)
MCV: 95.7 fL (ref 80.0–100.0)
Platelets: 225 10*3/uL (ref 150–400)
RBC: 3.96 MIL/uL (ref 3.87–5.11)
RDW: 14.6 % (ref 11.5–15.5)
WBC: 6.3 10*3/uL (ref 4.0–10.5)
nRBC: 0 % (ref 0.0–0.2)

## 2018-03-02 NOTE — H&P (Signed)
Michele Bryan is an 77 y.o. female.   Chief Complaint:Left shoulder pain HPI: 77 yo female who is s/p left shoulder reverseTSA who presents with worsening left shoulder pain after surgery.  Patient was worked up for this pain and noted to have initially a non-displaced stress related fracture of her scapular spine. She was treated conservatively with a shoulder abduction pillow sling and relative rest. Unfortunately on follow up she was noted to have displaced her fracture and presents now for scapular ORIF.   Past Medical History:  Diagnosis Date  . Anemia   . Arthritis   . GERD (gastroesophageal reflux disease)   . History of blood transfusion   . Hyperlipidemia   . Hypertension   . MS (multiple sclerosis) (HCC)   . Neuromuscular disorder (HCC)    multiple scleroosis/peripheral neuropathy  . PONV (postoperative nausea and vomiting)   . Rheumatoid arthritis (HCC)   . Ulcer   . Vitamin D deficiency     Past Surgical History:  Procedure Laterality Date  . AUGMENTATION MAMMAPLASTY    . breast augumentation    . BREAST SURGERY     biopsy  . COLONOSCOPY    . DILATION AND CURETTAGE OF UTERUS    . EYE SURGERY     both eys  . KNEE ARTHROSCOPY Right   . REVERSE SHOULDER ARTHROPLASTY Left 05/15/2017   Procedure: LEFT REVERSE SHOULDER ARTHROPLASTY;  Surgeon: Beverely Low, MD;  Location[facility] OR;  Service: Orthopedics;  Laterality: Left;  . THYROID LOBECTOMY    . TOTAL KNEE ARTHROPLASTY Right 06/15/2012   Procedure: RIGHT TOTAL KNEE ARTHROPLASTY;  Surgeon: [name], MD;  Location: [facility];  Service: Orthopedics;  Laterality: Right;  . WRIST SURGERY Left     Family History  Problem Relation Age of Onset  . Heart attack Father   . Breast cancer Neg Hx    Social History:  reports that she has never smoked. She has never used smokeless tobacco. She reports that she does not drink alcohol or use drugs.  Allergies:  Allergies  Allergen Reactions  . Codeine Nausea And Vomiting  .  Demerol [Meperidine] Nausea And Vomiting    Results for orders placed or performed during the hospital encounter of 03/02/18 (from the past 48 hour(s))  Basic metabolic panel     Status: None   Collection Time: 03/02/18 10:17 AM  Result Value Ref Range   Sodium 139 135 - 145 mmol/L   Potassium 4.4 3.5 - 5.1 mmol/L   Chloride 104 98 - 111 mmol/L   CO2 27 22 - 32 mmol/L   Glucose, Bld 90 70 - 99 mg/dL   BUN 18 8 - 23 mg/dL   Creatinine, Ser 2.77 0.44 - 1.00 mg/dL   Calcium 9.3 8.9 - 82.4 mg/dL   GFR calc non Af Amer >60 >60 mL/min   GFR calc Af Amer >60 >60 mL/min   Anion gap 8 5 - 15    Comment: Performed at [facility], [address], [place], [place] [address]  CBC     Status: Abnormal   Collection Time: 03/02/18 10:17 AM  Result Value Ref Range   WBC 6.3 4.0 - 10.5 K/uL   RBC 3.96 3.87 - 5.11 MIL/uL   Hemoglobin 11.6 (L) 12.0 - 15.0 g/dL   HCT 14.4 31.5 - 40.0 %   MCV 95.7 80.0 - 100.0 fL   MCH 29.3 26.0 - 34.0 pg   MCHC 30.6 30.0 - 36.0 g/dL  RDW 14.6 11.5 - 15.5 %   Platelets 225 150 - 400 K/uL   nRBC 0.0 0.0 - 0.2 %    Comment: Performed at [facility], [address], [place], [place] [address]   No results found.  ROS  There were no vitals taken for this visit. Physical Exam  AAO/ moderate distress due to left shoulder pain.  Left shoulder tender to palpation over the left scapular area. Limited left shoulder ROM due to pain. Elbow and wrist ROM normal.  NVI Assessment/Plan Left displaced scapular spine fracture after left reverse total shoulder replacement  Plan: ORIF left scapula fracure  Jeramie Scogin,STEVEN R, MD 03/02/2018, 12:00 PM

## 2018-03-02 NOTE — Progress Notes (Signed)
PCP - Dr. Sigmund Hazel  Cardiologist - Denies  Chest x-ray - 03/19/17 (E)  EKG - 03/02/18  Stress Test - Denies  ECHO - Denies  Cardiac Cath - Denies  AICD- na PM- na LOOP- na  Sleep Study - Denies CPAP - None  LABS- 03/02/18: CBC, BMP  ASA- Denies Orencia- On hold Methotrexate- On hold   Anesthesia- No  Pt denies having chest pain, sob, or fever at this time. All instructions explained to the pt, with a verbal understanding of the material. Pt agrees to go over the instructions while at home for a better understanding. The opportunity to ask questions was provided.

## 2018-03-02 NOTE — H&P (View-Only) (Signed)
Michele Bryan is an 77 y.o. female.   Chief Complaint:Left shoulder pain HPI: 77 yo female who is s/p left shoulder reverseTSA who presents with worsening left shoulder pain after surgery.  Patient was worked up for this pain and noted to have initially a non-displaced stress related fracture of her scapular spine. She was treated conservatively with a shoulder abduction pillow sling and relative rest. Unfortunately on follow up she was noted to have displaced her fracture and presents now for scapular ORIF.   Past Medical History:  Diagnosis Date  . Anemia   . Arthritis   . GERD (gastroesophageal reflux disease)   . History of blood transfusion   . Hyperlipidemia   . Hypertension   . MS (multiple sclerosis) (HCC)   . Neuromuscular disorder (HCC)    multiple scleroosis/peripheral neuropathy  . PONV (postoperative nausea and vomiting)   . Rheumatoid arthritis (HCC)   . Ulcer   . Vitamin D deficiency     Past Surgical History:  Procedure Laterality Date  . AUGMENTATION MAMMAPLASTY    . breast augumentation    . BREAST SURGERY     biopsy  . COLONOSCOPY    . DILATION AND CURETTAGE OF UTERUS    . EYE SURGERY     both eys  . KNEE ARTHROSCOPY Right   . REVERSE SHOULDER ARTHROPLASTY Left 05/15/2017   Procedure: LEFT REVERSE SHOULDER ARTHROPLASTY;  Surgeon: Beverely Low, MD;  Location: [facility] OR;  Service: Orthopedics;  Laterality: Left;  . THYROID LOBECTOMY    . TOTAL KNEE ARTHROPLASTY Right 06/15/2012   Procedure: RIGHT TOTAL KNEE ARTHROPLASTY;  Surgeon: [name], MD;  Location: [facility] ORS;  Service: Orthopedics;  Laterality: Right;  . WRIST SURGERY Left     Family History  Problem Relation Age of Onset  . Heart attack Father   . Breast cancer Neg Hx    Social History:  reports that she has never smoked. She has never used smokeless tobacco. She reports that she does not drink alcohol or use drugs.  Allergies:  Allergies  Allergen Reactions  . Codeine Nausea And Vomiting  .  Demerol [Meperidine] Nausea And Vomiting    Results for orders placed or performed during the hospital encounter of 03/02/18 (from the past 48 hour(s))  Basic metabolic panel     Status: None   Collection Time: 03/02/18 10:17 AM  Result Value Ref Range   Sodium 139 135 - 145 mmol/L   Potassium 4.4 3.5 - 5.1 mmol/L   Chloride 104 98 - 111 mmol/L   CO2 27 22 - 32 mmol/L   Glucose, Bld 90 70 - 99 mg/dL   BUN 18 8 - 23 mg/dL   Creatinine, Ser 2.77 0.44 - 1.00 mg/dL   Calcium 9.3 8.9 - 82.4 mg/dL   GFR calc non Af Amer >60 >60 mL/min   GFR calc Af Amer >60 >60 mL/min   Anion gap 8 5 - 15    Comment: Performed at [facility], [address], [place], [place] [address]  CBC     Status: Abnormal   Collection Time: 03/02/18 10:17 AM  Result Value Ref Range   WBC 6.3 4.0 - 10.5 K/uL   RBC 3.96 3.87 - 5.11 MIL/uL   Hemoglobin 11.6 (L) 12.0 - 15.0 g/dL   HCT 14.4 31.5 - 40.0 %   MCV 95.7 80.0 - 100.0 fL   MCH 29.3 26.0 - 34.0 pg   MCHC 30.6 30.0 - 36.0 g/dL  RDW 14.6 11.5 - 15.5 %   Platelets 225 150 - 400 K/uL   nRBC 0.0 0.0 - 0.2 %    Comment: Performed at [facility], [address] [place], [place] [address]   No results found.  ROS  There were no vitals taken for this visit. Physical Exam  AAO/ moderate distress due to left shoulder pain.  Left shoulder tender to palpation over the left scapular area. Limited left shoulder ROM due to pain. Elbow and wrist ROM normal.  NVI Assessment/Plan Left displaced scapular spine fracture after left reverse total shoulder replacement  Plan: ORIF left scapula fracure  Michele Bryan,STEVEN R, MD 03/02/2018, 12:00 PM   

## 2018-03-05 ENCOUNTER — Inpatient Hospital Stay (HOSPITAL_COMMUNITY): Payer: Medicare Other

## 2018-03-05 ENCOUNTER — Ambulatory Visit (HOSPITAL_COMMUNITY): Payer: Medicare Other | Admitting: Anesthesiology

## 2018-03-05 ENCOUNTER — Inpatient Hospital Stay (HOSPITAL_COMMUNITY)
Admission: AD | Admit: 2018-03-05 | Discharge: 2018-03-06 | DRG: 516 | Disposition: A | Discharge status: Home / Self Care | Payer: Medicare Other | Attending: Orthopedic Surgery | Admitting: Orthopedic Surgery

## 2018-03-05 ENCOUNTER — Other Ambulatory Visit: Payer: Self-pay

## 2018-03-05 ENCOUNTER — Encounter (HOSPITAL_COMMUNITY)
Admission: AD | Disposition: A | Discharge status: Home / Self Care | Payer: Self-pay | Source: Home / Self Care | Attending: Orthopedic Surgery

## 2018-03-05 ENCOUNTER — Encounter (HOSPITAL_COMMUNITY): Payer: Self-pay

## 2018-03-05 DIAGNOSIS — Z8249 Family history of ischemic heart disease and other diseases of the circulatory system: Secondary | ICD-10-CM | POA: Diagnosis not present

## 2018-03-05 DIAGNOSIS — S42109A Fracture of unspecified part of scapula, unspecified shoulder, initial encounter for closed fracture: Secondary | ICD-10-CM | POA: Diagnosis present

## 2018-03-05 DIAGNOSIS — K219 Gastro-esophageal reflux disease without esophagitis: Secondary | ICD-10-CM | POA: Diagnosis present

## 2018-03-05 DIAGNOSIS — M069 Rheumatoid arthritis, unspecified: Secondary | ICD-10-CM | POA: Diagnosis present

## 2018-03-05 DIAGNOSIS — E785 Hyperlipidemia, unspecified: Secondary | ICD-10-CM | POA: Diagnosis present

## 2018-03-05 DIAGNOSIS — E559 Vitamin D deficiency, unspecified: Secondary | ICD-10-CM | POA: Diagnosis present

## 2018-03-05 DIAGNOSIS — Z96612 Presence of left artificial shoulder joint: Secondary | ICD-10-CM | POA: Diagnosis not present

## 2018-03-05 DIAGNOSIS — G35 Multiple sclerosis: Secondary | ICD-10-CM | POA: Diagnosis not present

## 2018-03-05 DIAGNOSIS — M25512 Pain in left shoulder: Secondary | ICD-10-CM | POA: Diagnosis present

## 2018-03-05 DIAGNOSIS — M9669 Fracture of other bone following insertion of orthopedic implant, joint prosthesis, or bone plate: Secondary | ICD-10-CM | POA: Diagnosis not present

## 2018-03-05 DIAGNOSIS — S42192A Fracture of other part of scapula, left shoulder, initial encounter for closed fracture: Principal | ICD-10-CM | POA: Diagnosis present

## 2018-03-05 DIAGNOSIS — Z96651 Presence of right artificial knee joint: Secondary | ICD-10-CM | POA: Diagnosis not present

## 2018-03-05 DIAGNOSIS — I10 Essential (primary) hypertension: Secondary | ICD-10-CM | POA: Diagnosis present

## 2018-03-05 DIAGNOSIS — Z885 Allergy status to narcotic agent status: Secondary | ICD-10-CM | POA: Diagnosis not present

## 2018-03-05 HISTORY — PX: ORIF SHOULDER FRACTURE: SHX5035

## 2018-03-05 SURGERY — OPEN REDUCTION INTERNAL FIXATION (ORIF) SHOULDER FRACTURE
Anesthesia: General | Laterality: Left

## 2018-03-05 MED ORDER — ABATACEPT 125 MG/ML ~~LOC~~ SOSY: 125.0000 mg | PREFILLED_SYRINGE | SUBCUTANEOUS | Status: DC

## 2018-03-05 MED ORDER — ACETAMINOPHEN 500 MG PO TABS
500.0000 mg | ORAL_TABLET | Freq: Three times a day (TID) | ORAL | Status: DC | PRN
  Administered 2018-03-05: 500 mg via ORAL
  Filled 2018-03-05: qty 1

## 2018-03-05 MED ORDER — BUPIVACAINE-EPINEPHRINE (PF) 0.25% -1:200000 IJ SOLN
INTRAMUSCULAR | Status: DC | PRN
  Administered 2018-03-05: 9 mL

## 2018-03-05 MED ORDER — FERROUS SULFATE 325 (65 FE) MG PO TABS: 325.0000 mg | ORAL_TABLET | Freq: Two times a day (BID) | ORAL | Status: DC

## 2018-03-05 MED ORDER — VITAMIN D3 25 MCG (1000 UNIT) PO TABS
1000.0000 [IU] | ORAL_TABLET | Freq: Every day | ORAL | Status: DC
  Filled 2018-03-05 (×2): qty 1

## 2018-03-05 MED ORDER — METOCLOPRAMIDE HCL 5 MG PO TABS: 5.0000 mg | ORAL_TABLET | Freq: Three times a day (TID) | ORAL | Status: DC | PRN

## 2018-03-05 MED ORDER — LIDOCAINE 2% (20 MG/ML) 5 ML SYRINGE
INTRAMUSCULAR | Status: DC | PRN
  Administered 2018-03-05: 60 mg via INTRAVENOUS

## 2018-03-05 MED ORDER — ONDANSETRON HCL 4 MG/2ML IJ SOLN
INTRAMUSCULAR | Status: AC
  Filled 2018-03-05: qty 2

## 2018-03-05 MED ORDER — FENTANYL CITRATE (PF) 100 MCG/2ML IJ SOLN
25.0000 ug | INTRAMUSCULAR | Status: DC | PRN
  Administered 2018-03-05 (×4): 25 ug via INTRAVENOUS

## 2018-03-05 MED ORDER — MENTHOL 3 MG MT LOZG: 1.0000 | LOZENGE | OROMUCOSAL | Status: DC | PRN

## 2018-03-05 MED ORDER — MAGNESIUM OXIDE 400 (241.3 MG) MG PO TABS
200.0000 mg | ORAL_TABLET | Freq: Every day | ORAL | Status: DC
  Filled 2018-03-05 (×4): qty 1

## 2018-03-05 MED ORDER — SUGAMMADEX SODIUM 200 MG/2ML IV SOLN
INTRAVENOUS | Status: DC | PRN
  Administered 2018-03-05: 200 mg via INTRAVENOUS

## 2018-03-05 MED ORDER — HYDROXYZINE HCL 50 MG/ML IM SOLN
50.0000 mg | Freq: Four times a day (QID) | INTRAMUSCULAR | Status: DC | PRN
  Administered 2018-03-05 – 2018-03-06 (×2): 50 mg via INTRAMUSCULAR
  Filled 2018-03-05 (×2): qty 1

## 2018-03-05 MED ORDER — ONDANSETRON HCL 4 MG/2ML IJ SOLN
INTRAMUSCULAR | Status: DC | PRN
  Administered 2018-03-05: 4 mg via INTRAVENOUS

## 2018-03-05 MED ORDER — ALENDRONATE SODIUM 70 MG PO TABS: 70.0000 mg | ORAL_TABLET | ORAL | Status: DC

## 2018-03-05 MED ORDER — VITAMIN E 180 MG (400 UNIT) PO CAPS
400.0000 [IU] | ORAL_CAPSULE | Freq: Every day | ORAL | Status: DC
  Filled 2018-03-05 (×2): qty 1

## 2018-03-05 MED ORDER — ADULT MULTIVITAMIN W/MINERALS CH
1.0000 | ORAL_TABLET | Freq: Every day | ORAL | Status: DC
  Filled 2018-03-05: qty 1

## 2018-03-05 MED ORDER — TRAMADOL HCL 50 MG PO TABS
50.0000 mg | ORAL_TABLET | Freq: Four times a day (QID) | ORAL | Status: DC | PRN
  Administered 2018-03-05 – 2018-03-06 (×3): 50 mg via ORAL
  Filled 2018-03-05 (×3): qty 1

## 2018-03-05 MED ORDER — CEFAZOLIN SODIUM-DEXTROSE 2-4 GM/100ML-% IV SOLN
2.0000 g | INTRAVENOUS | Status: AC
  Administered 2018-03-05: 2 g via INTRAVENOUS
  Filled 2018-03-05: qty 100

## 2018-03-05 MED ORDER — TRAMADOL HCL 50 MG PO TABS: 50.0000 mg | ORAL_TABLET | Freq: Four times a day (QID) | ORAL | Status: DC

## 2018-03-05 MED ORDER — PHENOL 1.4 % MT LIQD: 1.0000 | OROMUCOSAL | Status: DC | PRN

## 2018-03-05 MED ORDER — ONDANSETRON HCL 4 MG/2ML IJ SOLN
4.0000 mg | Freq: Four times a day (QID) | INTRAMUSCULAR | Status: AC | PRN
  Administered 2018-03-05: 4 mg via INTRAVENOUS

## 2018-03-05 MED ORDER — ROCURONIUM BROMIDE 50 MG/5ML IV SOSY
PREFILLED_SYRINGE | INTRAVENOUS | Status: AC
  Filled 2018-03-05: qty 5

## 2018-03-05 MED ORDER — GABAPENTIN 100 MG PO CAPS
100.0000 mg | ORAL_CAPSULE | Freq: Three times a day (TID) | ORAL | Status: DC
  Administered 2018-03-05 – 2018-03-06 (×3): 100 mg via ORAL
  Filled 2018-03-05 (×3): qty 1

## 2018-03-05 MED ORDER — FENTANYL CITRATE (PF) 100 MCG/2ML IJ SOLN
INTRAMUSCULAR | Status: AC
  Administered 2018-03-05: 25 ug via INTRAVENOUS
  Filled 2018-03-05: qty 2

## 2018-03-05 MED ORDER — CEFAZOLIN SODIUM-DEXTROSE 2-4 GM/100ML-% IV SOLN
2.0000 g | Freq: Four times a day (QID) | INTRAVENOUS | Status: AC
  Administered 2018-03-05 – 2018-03-06 (×3): 2 g via INTRAVENOUS
  Filled 2018-03-05 (×3): qty 100

## 2018-03-05 MED ORDER — METHOTREXATE 2.5 MG PO TABS: 15.0000 mg | ORAL_TABLET | ORAL | Status: DC

## 2018-03-05 MED ORDER — HYDROMORPHONE HCL 1 MG/ML IJ SOLN
0.5000 mg | INTRAMUSCULAR | Status: DC | PRN
  Administered 2018-03-05 – 2018-03-06 (×4): 0.5 mg via INTRAVENOUS
  Filled 2018-03-05 (×4): qty 0.5

## 2018-03-05 MED ORDER — 0.9 % SODIUM CHLORIDE (POUR BTL) OPTIME
TOPICAL | Status: DC | PRN
  Administered 2018-03-05: 1000 mL

## 2018-03-05 MED ORDER — ONDANSETRON 4 MG PO TBDP: 4.0000 mg | ORAL_TABLET | Freq: Three times a day (TID) | ORAL | Status: DC | PRN

## 2018-03-05 MED ORDER — PROPOFOL 10 MG/ML IV BOLUS
INTRAVENOUS | Status: AC
  Filled 2018-03-05: qty 20

## 2018-03-05 MED ORDER — DEXAMETHASONE SODIUM PHOSPHATE 10 MG/ML IJ SOLN
INTRAMUSCULAR | Status: DC | PRN
  Administered 2018-03-05: 10 mg via INTRAVENOUS

## 2018-03-05 MED ORDER — VITAMIN A 8000 UNITS PO CAPS: 8000.0000 [IU] | ORAL_CAPSULE | Freq: Every day | ORAL | Status: DC

## 2018-03-05 MED ORDER — SODIUM CHLORIDE 0.9 % IV SOLN: INTRAVENOUS | Status: DC

## 2018-03-05 MED ORDER — FENTANYL CITRATE (PF) 250 MCG/5ML IJ SOLN
INTRAMUSCULAR | Status: AC
  Filled 2018-03-05: qty 5

## 2018-03-05 MED ORDER — KRILL OIL 500 MG PO CAPS: 500.0000 mg | ORAL_CAPSULE | Freq: Every day | ORAL | Status: DC

## 2018-03-05 MED ORDER — OXYCODONE HCL 5 MG/5ML PO SOLN: 5.0000 mg | Freq: Once | ORAL | Status: DC | PRN

## 2018-03-05 MED ORDER — LACTATED RINGERS IV SOLN
INTRAVENOUS | Status: DC
  Administered 2018-03-05: 09:00:00 via INTRAVENOUS

## 2018-03-05 MED ORDER — ONDANSETRON HCL 4 MG/2ML IJ SOLN: 4.0000 mg | Freq: Four times a day (QID) | INTRAMUSCULAR | Status: DC | PRN

## 2018-03-05 MED ORDER — SODIUM CHLORIDE 0.9 % IV SOLN
INTRAVENOUS | Status: DC | PRN
  Administered 2018-03-05: 30 ug/min via INTRAVENOUS

## 2018-03-05 MED ORDER — FENTANYL CITRATE (PF) 100 MCG/2ML IJ SOLN
INTRAMUSCULAR | Status: AC
  Filled 2018-03-05: qty 2

## 2018-03-05 MED ORDER — METOCLOPRAMIDE HCL 5 MG/ML IJ SOLN
5.0000 mg | Freq: Three times a day (TID) | INTRAMUSCULAR | Status: DC | PRN
  Administered 2018-03-05 – 2018-03-06 (×2): 10 mg via INTRAVENOUS
  Filled 2018-03-05 (×2): qty 2

## 2018-03-05 MED ORDER — ZOLPIDEM TARTRATE 5 MG PO TABS
5.0000 mg | ORAL_TABLET | Freq: Every evening | ORAL | Status: DC | PRN
  Administered 2018-03-06: 5 mg via ORAL
  Filled 2018-03-05: qty 1

## 2018-03-05 MED ORDER — PROPOFOL 10 MG/ML IV BOLUS
INTRAVENOUS | Status: DC | PRN
  Administered 2018-03-05: 120 mg via INTRAVENOUS

## 2018-03-05 MED ORDER — DOCUSATE SODIUM 100 MG PO CAPS
100.0000 mg | ORAL_CAPSULE | Freq: Two times a day (BID) | ORAL | Status: DC
  Administered 2018-03-05 (×2): 100 mg via ORAL
  Filled 2018-03-05 (×2): qty 1

## 2018-03-05 MED ORDER — CYSTEX PO LIQD: 15.0000 mL | Freq: Every day | ORAL | Status: DC

## 2018-03-05 MED ORDER — OXYCODONE HCL 5 MG PO TABS: 5.0000 mg | ORAL_TABLET | Freq: Once | ORAL | Status: DC | PRN

## 2018-03-05 MED ORDER — ROCURONIUM BROMIDE 10 MG/ML (PF) SYRINGE
PREFILLED_SYRINGE | INTRAVENOUS | Status: DC | PRN
  Administered 2018-03-05: 50 mg via INTRAVENOUS

## 2018-03-05 MED ORDER — ONDANSETRON HCL 4 MG PO TABS: 4.0000 mg | ORAL_TABLET | Freq: Four times a day (QID) | ORAL | Status: DC | PRN

## 2018-03-05 MED ORDER — MIDAZOLAM HCL 2 MG/2ML IJ SOLN
INTRAMUSCULAR | Status: AC
  Filled 2018-03-05: qty 2

## 2018-03-05 MED ORDER — BUPIVACAINE-EPINEPHRINE (PF) 0.25% -1:200000 IJ SOLN
INTRAMUSCULAR | Status: AC
  Filled 2018-03-05: qty 30

## 2018-03-05 MED ORDER — CHLORHEXIDINE GLUCONATE 4 % EX LIQD: 60.0000 mL | Freq: Once | CUTANEOUS | Status: DC

## 2018-03-05 MED ORDER — FOLIC ACID 1 MG PO TABS
1.0000 mg | ORAL_TABLET | Freq: Every day | ORAL | Status: DC
  Filled 2018-03-05: qty 1

## 2018-03-05 MED ORDER — FENTANYL CITRATE (PF) 250 MCG/5ML IJ SOLN
INTRAMUSCULAR | Status: DC | PRN
  Administered 2018-03-05: 100 ug via INTRAVENOUS
  Administered 2018-03-05 (×2): 50 ug via INTRAVENOUS

## 2018-03-05 MED ORDER — LOSARTAN POTASSIUM 50 MG PO TABS
75.0000 mg | ORAL_TABLET | Freq: Every day | ORAL | Status: DC
  Administered 2018-03-05: 75 mg via ORAL
  Filled 2018-03-05: qty 2

## 2018-03-05 MED ORDER — TRAMADOL HCL 50 MG PO TABS: 50.0000 mg | ORAL_TABLET | Freq: Four times a day (QID) | ORAL | 0 refills | Status: DC | PRN

## 2018-03-05 SURGICAL SUPPLY — 72 items
BIT DRILL 2.5X2.75 QC CALB (BIT) ×1 IMPLANT
BIT DRILL 2.8X5 QR DISP (BIT) ×1 IMPLANT
BIT DRILL CALIBRATED 2.7 (BIT) ×1 IMPLANT
CLSR STERI-STRIP ANTIMIC 1/2X4 (GAUZE/BANDAGES/DRESSINGS) ×1 IMPLANT
COVER SURGICAL LIGHT HANDLE (MISCELLANEOUS) ×2 IMPLANT
COVER WAND RF STERILE (DRAPES) ×2 IMPLANT
DRAPE INCISE IOBAN 66X45 STRL (DRAPES) ×4 IMPLANT
DRAPE U-SHAPE 47X51 STRL (DRAPES) ×2 IMPLANT
DRSG ADAPTIC 3X8 NADH LF (GAUZE/BANDAGES/DRESSINGS) ×1 IMPLANT
DRSG EMULSION OIL 3X3 NADH (GAUZE/BANDAGES/DRESSINGS) ×2 IMPLANT
DRSG PAD ABDOMINAL 8X10 ST (GAUZE/BANDAGES/DRESSINGS) ×2 IMPLANT
ELECT NDL TIP 2.8 STRL (NEEDLE) ×1 IMPLANT
ELECT NEEDLE TIP 2.8 STRL (NEEDLE) ×2 IMPLANT
ELECT REM PT RETURN 9FT ADLT (ELECTROSURGICAL) ×2
ELECTRODE REM PT RTRN 9FT ADLT (ELECTROSURGICAL) ×1 IMPLANT
GAUZE SPONGE 4X4 12PLY STRL (GAUZE/BANDAGES/DRESSINGS) ×2 IMPLANT
GLOVE BIOGEL PI ORTHO PRO 7.5 (GLOVE) ×1
GLOVE BIOGEL PI ORTHO PRO SZ8 (GLOVE) ×1
GLOVE ORTHO TXT STRL SZ7.5 (GLOVE) ×2 IMPLANT
GLOVE PI ORTHO PRO STRL 7.5 (GLOVE) ×1 IMPLANT
GLOVE PI ORTHO PRO STRL SZ8 (GLOVE) ×1 IMPLANT
GLOVE SURG ORTHO 8.5 STRL (GLOVE) ×2 IMPLANT
GOWN STRL REUS W/ TWL LRG LVL3 (GOWN DISPOSABLE) ×2 IMPLANT
GOWN STRL REUS W/ TWL XL LVL3 (GOWN DISPOSABLE) ×2 IMPLANT
GOWN STRL REUS W/TWL LRG LVL3 (GOWN DISPOSABLE) ×4
GOWN STRL REUS W/TWL XL LVL3 (GOWN DISPOSABLE) ×4
KIT BASIN OR (CUSTOM PROCEDURE TRAY) ×2 IMPLANT
KIT TURNOVER KIT B (KITS) ×2 IMPLANT
MANIFOLD NEPTUNE II (INSTRUMENTS) ×2 IMPLANT
NDL 1/2 CIR MAYO (NEEDLE) ×1 IMPLANT
NEEDLE 1/2 CIR MAYO (NEEDLE) ×2 IMPLANT
NEEDLE 22X1 1/2 (OR ONLY) (NEEDLE) IMPLANT
NS IRRIG 1000ML POUR BTL (IV SOLUTION) ×2 IMPLANT
PACK SHOULDER (CUSTOM PROCEDURE TRAY) ×2 IMPLANT
PAD ABD 8X10 STRL (GAUZE/BANDAGES/DRESSINGS) ×1 IMPLANT
PAD ARMBOARD 7.5X6 YLW CONV (MISCELLANEOUS) ×4 IMPLANT
PASSER SUT SWANSON 36MM LOOP (INSTRUMENTS) ×2 IMPLANT
PLATE J CLAVICLE LT 64 8H (Plate) IMPLANT
PLATE J CLAVICLE LT 8 HOLE (Plate) ×2 IMPLANT
PLATE LOCK 8H 103 BILAT FIB (Plate) ×1 IMPLANT
PUTTY DBM STAGRAFT PLUS 2CC (Putty) ×1 IMPLANT
SCREW CORTICAL LOW PROF 3.5X20 (Screw) ×2 IMPLANT
SCREW HEXALOBE LOCKING 3.5X16M (Screw) ×1 IMPLANT
SCREW HEXALOBE NON-LOCK 3.5X14 (Screw) ×1 IMPLANT
SCREW LOCK 28X3.5X HEXALOBE (Screw) IMPLANT
SCREW LOCK CORT STAR 3.5X10 (Screw) ×1 IMPLANT
SCREW LOCK CORT STAR 3.5X26 (Screw) ×1 IMPLANT
SCREW LOCKING 3.5X28 (Screw) ×2 IMPLANT
SCREW NON LOCKING HEX 3.5X22 (Screw) ×1 IMPLANT
SCREW NONLOCK HEX 3.5X12 (Screw) ×3 IMPLANT
SLING ARM FOAM STRAP XLG (SOFTGOODS) IMPLANT
SLING ARM IMMOBILIZER LRG (SOFTGOODS) IMPLANT
SPONGE LAP 4X18 RFD (DISPOSABLE) ×4 IMPLANT
STAPLER VISISTAT 35W (STAPLE) ×2 IMPLANT
STRIP CLOSURE SKIN 1/2X4 (GAUZE/BANDAGES/DRESSINGS) ×2 IMPLANT
SUCTION FRAZIER HANDLE 10FR (MISCELLANEOUS) ×1
SUCTION TUBE FRAZIER 10FR DISP (MISCELLANEOUS) ×1 IMPLANT
SUT BONE WAX W31G (SUTURE) IMPLANT
SUT ETHIBOND NAB CT1 #1 30IN (SUTURE) ×4 IMPLANT
SUT FIBERWIRE #2 38 T-5 BLUE (SUTURE)
SUT MNCRL AB 4-0 PS2 18 (SUTURE) ×2 IMPLANT
SUT VIC AB 0 CT1 27 (SUTURE) ×2
SUT VIC AB 0 CT1 27XBRD ANBCTR (SUTURE) ×1 IMPLANT
SUT VIC AB 2-0 CT1 27 (SUTURE) ×4
SUT VIC AB 2-0 CT1 TAPERPNT 27 (SUTURE) ×2 IMPLANT
SUT VICRYL 4-0 PS2 18IN ABS (SUTURE) ×2 IMPLANT
SUTURE FIBERWR #2 38 T-5 BLUE (SUTURE) IMPLANT
SYR CONTROL 10ML LL (SYRINGE) ×2 IMPLANT
TOWEL OR 17X24 6PK STRL BLUE (TOWEL DISPOSABLE) ×2 IMPLANT
TOWEL OR 17X26 10 PK STRL BLUE (TOWEL DISPOSABLE) ×2 IMPLANT
WATER STERILE IRR 1000ML POUR (IV SOLUTION) ×2 IMPLANT
YANKAUER SUCT BULB TIP NO VENT (SUCTIONS) ×2 IMPLANT

## 2018-03-05 NOTE — Anesthesia Preprocedure Evaluation (Signed)
Anesthesia Evaluation  Patient identified by MRN, date of birth, ID band Patient awake    Reviewed: Allergy & Precautions, H&P , NPO status , Patient's Chart, lab work & pertinent test results  History of Anesthesia Complications (+) PONV and history of anesthetic complications  Airway Mallampati: II   Neck ROM: full    Dental   Pulmonary    breath sounds clear to auscultation       Cardiovascular hypertension,  Rhythm:regular Rate:Normal     Neuro/Psych  Neuromuscular disease    GI/Hepatic GERD  ,  Endo/Other    Renal/GU      Musculoskeletal  (+) Arthritis ,   Abdominal   Peds  Hematology   Anesthesia Other Findings   Reproductive/Obstetrics                             Anesthesia Physical Anesthesia Plan  ASA: II  Anesthesia Plan: General   Post-op Pain Management:    Induction: Intravenous  PONV Risk Score and Plan: 4 or greater and Ondansetron, Dexamethasone, Treatment may vary due to age or medical condition and Scopolamine patch - Pre-op  Airway Management Planned: Oral ETT  Additional Equipment:   Intra-op Plan:   Post-operative Plan: Extubation in OR  Informed Consent: I have reviewed the patients History and Physical, chart, labs and discussed the procedure including the risks, benefits and alternatives for the proposed anesthesia with the patient or authorized representative who has indicated his/her understanding and acceptance.     Plan Discussed with: CRNA, Anesthesiologist and Surgeon  Anesthesia Plan Comments:         Anesthesia Quick Evaluation

## 2018-03-05 NOTE — Anesthesia Procedure Notes (Signed)
Procedure Name: Intubation Date/Time: 03/05/2018 10:30 AM Performed by: Myna Bright, CRNA Pre-anesthesia Checklist: Patient identified, Emergency Drugs available, Suction available and Patient being monitored Patient Re-evaluated:Patient Re-evaluated prior to induction Oxygen Delivery Method: Circle system utilized Preoxygenation: Pre-oxygenation with 100% oxygen Induction Type: IV induction Ventilation: Mask ventilation without difficulty Laryngoscope Size: Mac and 3 Grade View: Grade I Tube type: Oral Tube size: 7.0 mm Number of attempts: 1 Airway Equipment and Method: Stylet Placement Confirmation: ETT inserted through vocal cords under direct vision,  positive ETCO2 and breath sounds checked- equal and bilateral Secured at: 21 cm Tube secured with: Tape Dental Injury: Teeth and Oropharynx as per pre-operative assessment

## 2018-03-05 NOTE — Op Note (Signed)
NAME: KIP, MCINERNY MEDICAL RECORD [id number] ACCOUNT [id number] DATE OF BIRTH:Jul 03, 1940 FACILITY: [facility] LOCATION: [facility] PHYSICIAN:STEVEN Russ Halo, MD  OPERATIVE REPORT  DATE OF PROCEDURE:  03/05/2018  PREOPERATIVE DIAGNOSIS:  Displaced left scapular fracture.  POSTOPERATIVE DIAGNOSIS:  Displaced left scapular fracture.  PROCEDURE PERFORMED:  Open reduction internal fixation of left displaced scapular fracture using combination of Biomet and Acumed plate.  ATTENDING SURGEON:  Malon Kindle, MD  ASSISTANT:  Jodene Nam, PA-C, who was scrubbed during the entire procedure and necessary for satisfactory completion of surgery.  ANESTHESIA:  General anesthesia was used plus local.  ESTIMATED BLOOD LOSS:  100 mL  FLUID REPLACEMENT:  1500 mL crystalloid.  INSTRUMENT COUNTS:  Correct.  COMPLICATIONS:  No complications.  ANTIBIOTICS:  Perioperative antibiotics were given.  INDICATIONS:  The patient is a 78 year old female with a history of prior left reverse shoulder replacement.  About six months postoperatively, the patient began developing pain in her shoulder in the posterior aspect concerning for possible stress  fracture of the scapula.  This developed initially seen on CT and eventually on plain x-ray and despite conservative management with abduction pillow sling began to gap open and showed evidence of being unstable.  Because this fracture was not going to  heal, we recommended to the patient we proceed with an ORIF of her scapula.  The patient agreed to restore function and eliminate pain to her shoulder.  Informed consent obtained.  DESCRIPTION OF PROCEDURE:  After adequate level of anesthesia was achieved, the patient was positioned in modified beach chair position.  Left shoulder correctly identified sterilely prepped and draped in the usual manner.  We had good exposure of the  posterior scapular area and the fracture site was palpable.  Once we had that  sterilely prepped and draped, we called a timeout, verifying correct patient, correct site and we then made a longitudinal incision over the scapular spine.  Dissection down  through subcutaneous tissues, fascia and divided sharply using the Bovie.  We did subperiosteal dissection of the scapula identifying the fractured scapula body and this was a fracture that started through the scapular spine and then went laterally at an  oblique angle.  We were able to clean out the fracture site, removing the fracture callus and scar tissue and getting the fracture anatomically reduced.  We used a buttress plate posteriorly which was an Acumed titanium plate that we contoured slightly  to provide a good buttress and to align the fracture appropriately.  It had shifted such that the fracture fragments were not appropriately aligned as well as gapped.  Once we had the fracture buttressed with a buttress plate in the back, we also  augmented this with a tension band construct, so we had good purchase on screws medially and laterally which provided excellent buttressing of the fracture and alignment of the fracture, but we felt that we need tension band, so we used a Biomet ALPS  plate over the dorsal aspect of the fracture running along the scapular spine.  We had two screws medially and two screws laterally and excellent bone.  We used locking screws where we could and nonlocking screws to pull the plate to the bone, so a  combination of locked and nonlocked screws.  We had anatomic reduction of the fracture prior to compressing the fracture together.  We took demineralized bone matrix, DBM from Biomet and put that into the fracture site and used that to augment our  repair.  We also used DBM  on anterior table of the scapula along the fractured area, so this should augment healing.  We will also use a bone stimulator postop.  At this point, we irrigated thoroughly and then we closed the muscular layer with 0 Vicryl   suture followed by 2-0 Vicryl for subcutaneous closure and 4-0 Monocryl for skin.  Steri-Strips applied followed by sterile dressing.  The patient was placed in a shoulder abduction pillow sling and transported to the recovery room in stable condition.  TN/NUANCE  D:03/05/2018 T:03/05/2018 JOB:[id number]

## 2018-03-05 NOTE — Interval H&P Note (Signed)
History and Physical Interval Note:  03/05/2018 10:03 AM  Michele Bryan  has presented today for surgery, with the diagnosis of Left scapular fracture  The various methods of treatment have been discussed with the patient and family. After consideration of risks, benefits and other options for treatment, the patient has consented to  Procedure(s): OPEN REDUCTION INTERNAL FIXATION (ORIF) LEFT SCAPULA (Left) as a surgical intervention .  The patient's history has been reviewed, patient examined, no change in status, stable for surgery.  I have reviewed the patient's chart and labs.  Questions were answered to the patient's satisfaction.     Kandee Escalante,STEVEN R

## 2018-03-05 NOTE — Discharge Instructions (Signed)
Ice to the shoulder constantly.  Keep the incision covered and clean and dry for one week, then ok to get it wet in the shower.    DO NOT try to reach with the operative arm. No push or pull  Use the pillow sling while you are up and moving around, may remove with assistance while seated.  Keep pillow propped behind the operative elbow and under the arm to keep the arm away from your side  Follow up with Dr Ranell Patrick in two weeks in the office, call [phone] for appt

## 2018-03-05 NOTE — Transfer of Care (Signed)
Immediate Anesthesia Transfer of Care Note  Patient: Michele Bryan  Procedure(s) Performed: OPEN REDUCTION INTERNAL FIXATION (ORIF) LEFT SCAPULA (Left )  Patient Location: PACU  Anesthesia Type:General  Level of Consciousness: awake, alert , oriented and patient cooperative  Airway & Oxygen Therapy: Patient Spontanous Breathing and Patient connected to nasal cannula oxygen  Post-op Assessment: Report given to RN, Post -op Vital signs reviewed and stable and Patient moving all extremities  Post vital signs: Reviewed and stable  Last Vitals:  Vitals Value Taken Time  BP 147/78 03/05/2018 12:53 PM  Temp    Pulse 69 03/05/2018 12:56 PM  Resp 14 03/05/2018 12:56 PM  SpO2 100 % 03/05/2018 12:56 PM  Vitals shown include unvalidated device data.  Last Pain:  Vitals:   03/05/18 1253  TempSrc:   PainSc: (P) 0-No pain      Patients Stated Pain Goal: 3 (03/05/18 2778)  Complications: No apparent anesthesia complications

## 2018-03-05 NOTE — Brief Op Note (Signed)
03/05/2018  12:59 PM  PATIENT:  Lanney Gins  78 y.o. female  PRE-OPERATIVE DIAGNOSIS:   Displaced scapula fracture  POST-OPERATIVE DIAGNOSIS:   same  PROCEDURE:  Procedure(s): OPEN REDUCTION INTERNAL FIXATION (ORIF) LEFT SCAPULA (Left)  SURGEON:  Surgeon(s) and Role:    Beverely Low, MD - Primary  PHYSICIAN ASSISTANT:   ASSISTANTS: Leilani Able PA-C   ANESTHESIA:   local and general  EBL:  100 mL   BLOOD ADMINISTERED:none  DRAINS: none   LOCAL MEDICATIONS USED:  MARCAINE     SPECIMEN:  No Specimen  DISPOSITION OF SPECIMEN:  N/A  COUNTS:  YES  TOURNIQUET:  * No tourniquets in log *  DICTATION: .Other Dictation: Dictation Number [id number]  PLAN OF CARE: Admit to inpatient   PATIENT DISPOSITION:  PACU - hemodynamically stable.   Delay start of Pharmacological VTE agent (>24hrs) due to surgical blood loss or risk of bleeding: not applicable

## 2018-03-06 DIAGNOSIS — S42192A Fracture of other part of scapula, left shoulder, initial encounter for closed fracture: Secondary | ICD-10-CM | POA: Diagnosis not present

## 2018-03-06 DIAGNOSIS — M25512 Pain in left shoulder: Secondary | ICD-10-CM | POA: Diagnosis not present

## 2018-03-06 LAB — BASIC METABOLIC PANEL
Anion gap: 7 (ref 5–15)
BUN: 11 mg/dL (ref 8–23)
CO2: 26 mmol/L (ref 22–32)
Calcium: 8.9 mg/dL (ref 8.9–10.3)
Chloride: 105 mmol/L (ref 98–111)
Creatinine, Ser: 0.88 mg/dL (ref 0.44–1.00)
GFR calc Af Amer: >60 mL/min (ref >60)
GFR calc non Af Amer: >60 mL/min (ref >60)
Glucose, Bld: 100 mg/dL — ABNORMAL HIGH (ref 70–99)
Potassium: 3.8 mmol/L (ref 3.5–5.1)
Sodium: 138 mmol/L (ref 135–145)

## 2018-03-06 LAB — HEMOGLOBIN AND HEMATOCRIT, BLOOD
HCT: 33.5 % — ABNORMAL LOW (ref 36.0–46.0)
Hemoglobin: 10.3 g/dL — ABNORMAL LOW (ref 12.0–15.0)

## 2018-03-06 MED ORDER — TRAMADOL HCL 50 MG PO TABS
50.0000 mg | ORAL_TABLET | Freq: Four times a day (QID) | ORAL | Status: DC | PRN
  Administered 2018-03-06: 100 mg via ORAL
  Filled 2018-03-06: qty 2

## 2018-03-06 MED ORDER — TRAMADOL HCL 50 MG PO TABS: 50.0000 mg | ORAL_TABLET | ORAL | 0 refills | Status: DC | PRN

## 2018-03-06 MED ORDER — TRAMADOL HCL 50 MG PO TABS: 50.0000 mg | ORAL_TABLET | Freq: Four times a day (QID) | ORAL | Status: DC | PRN

## 2018-03-06 NOTE — Evaluation (Signed)
Physical Therapy Evaluation Patient Details Name: Michele Bryan MRN: [id number] DOB: 1940/06/28 Today's Date: 03/06/2018   History of Present Illness  Pt is a 78 y.o. F with significant PMH of MS, RA, and prior left reverse shoulder replacement 6 months ago who presents with displaced left scapular fracture, now s/p open reduction internal fixation of left scapula.   Clinical Impression  Patient presents with above diagnosis. Displaying decreased functional mobility secondary to left shoulder pain, abnormal posture, weakness, and balance deficits. Ambulating 250 feet with cane and supervision. Recommended cane for all mobility. Educated pt on very gentle seated postural re-education exercises. Pt and pt husband with no further questions; plan is for discharge home today.     Follow Up Recommendations Other (comment) Would benefit from follow up OP PT/OT when surgeon clears    Equipment Recommendations  None recommended by PT    Recommendations for Other Services       Precautions / Restrictions Precautions Precautions: Fall Restrictions Weight Bearing Restrictions: Yes LUE Weight Bearing: Non weight bearing Other Position/Activity Restrictions: Sling wear all times      Mobility  Bed Mobility Overal bed mobility: Modified Independent             General bed mobility comments: Able to progress to right side of bed without physical assistance  Transfers Overall transfer level: Needs assistance Equipment used: None Transfers: Sit to/from Stand Sit to Stand: Supervision            Ambulation/Gait Ambulation/Gait assistance: Supervision;Min guard Gait Distance (Feet): 200 Feet Assistive device: Straight cane Gait Pattern/deviations: Step-through pattern;Decreased stride length;Decreased dorsiflexion - right;Decreased dorsiflexion - left Gait velocity: decreased Gait velocity interpretation: <1.8 ft/sec, indicate of risk for recurrent falls General Gait Details:  Slow, guarded gait, tending to rotate away from surgical site (towards right side). Decreased bilateral heel strike at initial contact.   Stairs            Wheelchair Mobility    Modified Rankin (Stroke Patients Only)       Balance Overall balance assessment: Needs assistance Sitting-balance support: Feet supported Sitting balance-Leahy Scale: Good     Standing balance support: Single extremity supported;During functional activity Standing balance-Leahy Scale: Fair                               Pertinent Vitals/Pain Pain Assessment: Faces Faces Pain Scale: Hurts even more Pain Location: left shoulder Pain Descriptors / Indicators: Aching;Grimacing;Guarding Pain Intervention(s): Monitored during session    Home Living Family/patient expects to be discharged to:: Private residence Living Arrangements: Spouse/significant other Available Help at Discharge: Family;Available 24 hours/day Type of Home: House Home Access: Stairs to enter   [employer] of Steps: 1 Home Layout: Two level;Able to live on main level with bedroom/bathroom Home Equipment: Dan Humphreys - 2 wheels;Shower seat;Cane - single point      Prior Function Level of Independence: Independent         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   Dominant Hand: Right    Extremity/Trunk Assessment   Upper Extremity Assessment Upper Extremity Assessment: Defer to OT evaluation    Lower Extremity Assessment Lower Extremity Assessment: Generalized weakness    Cervical / Trunk Assessment Cervical / Trunk Assessment: Kyphotic  Communication   Communication: No difficulties  Cognition Arousal/Alertness: Awake/alert Behavior During Therapy: WFL for tasks assessed/performed Overall Cognitive Status: Within Functional Limits for tasks assessed  General Comments      Exercises Other Exercises Other Exercises:  Gentle scapular squeezes x 5  Other Exercises: Cervical retractions x 5   Assessment/Plan    PT Assessment Patient needs continued PT services  PT Problem List Decreased strength;Decreased range of motion;Decreased balance;Decreased mobility;Pain       PT Treatment Interventions DME instruction;Gait training;Stair training;Functional mobility training;Therapeutic activities;Therapeutic exercise;Balance training;Patient/family education    PT Goals (Current goals can be found in the Care Plan section)  Acute Rehab PT Goals Patient Stated Goal: "return to help out some at [employer]." PT Goal Formulation: With patient Time For Goal Achievement: 03/20/18 Potential to Achieve Goals: Good    Frequency Min 5X/week   Barriers to discharge        Co-evaluation               AM-PAC PT "6 Clicks" Mobility  Outcome Measure Help needed turning from your back to your side while in a flat bed without using bedrails?: None Help needed moving from lying on your back to sitting on the side of a flat bed without using bedrails?: None Help needed moving to and from a bed to a chair (including a wheelchair)?: None Help needed standing up from a chair using your arms (e.g., wheelchair or bedside chair)?: None Help needed to walk in hospital room?: A Little Help needed climbing 3-5 steps with a railing? : A Little 6 Click Score: 22    End of Session Equipment Utilized During Treatment: Gait belt;Other (comment)(sling) Activity Tolerance: Patient tolerated treatment well Patient left: in bed;with call bell/phone within reach;with family/visitor present Nurse Communication: Mobility status PT Visit Diagnosis: Unsteadiness on feet (R26.81);Pain Pain - Right/Left: Left Pain - part of body: Shoulder    Time: [id number] PT Time Calculation (min) (ACUTE ONLY): 28 min   Charges:   PT Evaluation $PT Eval Moderate Complexity: 1 Mod PT Treatments $Therapeutic Activity: 8-22 mins       Laurina Bustlearoline Lofton Leon, PT, DPT Acute Rehabilitation Services Pager [phone] Office [phone]  Vanetta MuldersCarloine H Adalyn Pennock 03/06/2018, 9:33 AM

## 2018-03-06 NOTE — Progress Notes (Signed)
   Subjective:  Patient reports pain as mild.  No overnight events.  States that she feels quite comfortable this morning.  She denies any numbness or tingling.  No chest pain or shortness of breath.  Objective:   VITALS:   Vitals:   03/05/18 1641 03/05/18 2319 03/06/18 0315 03/06/18 0716  BP:  (!) 104/56 100/60 114/62  Pulse:  70 62 64  Resp: 16 16 16 16   Temp:  98 F (36.7 C) 97.8 F (36.6 C) 97.8 F (36.6 C)  TempSrc:  Oral Oral Oral  SpO2:  97% 97% 95%  Weight:      Height:        Neurologically intact Neurovascular intact Sensation intact distally Intact pulses distally Incision: dressing C/D/I and no drainage Compartment soft Sling in place with pillow  Lab Results  Component Value Date   WBC 6.3 03/02/2018   HGB 10.3 (L) 03/06/2018   HCT 33.5 (L) 03/06/2018   MCV 95.7 03/02/2018   PLT 225 03/02/2018   BMET    Component Value Date/Time   NA 138 03/06/2018 0733   K 3.8 03/06/2018 0733   CL 105 03/06/2018 0733   CO2 26 03/06/2018 0733   GLUCOSE 100 (H) 03/06/2018 0733   BUN 11 03/06/2018 0733   CREATININE 0.88 03/06/2018 0733   CREATININE 0.79 01/14/2018 1109   CALCIUM 8.9 03/06/2018 0733   GFRNONAA >60 03/06/2018 0733   GFRNONAA 74 01/14/2018 1109   GFRAA >60 03/06/2018 0733   GFRAA 85 01/14/2018 1109     Assessment/Plan: 1 Day Post-Op   Active Problems:   Scapula fracture   Up with therapy -Strict compliance with sling and abduction pillow at all times.  Nonweightbearing to left upper extremity. -Dressing changed to Aquacel this morning.  She will discharge home today. -Follow-up with Dr. Devonne DoughtyNoris in 2 weeks.   Yolonda KidaJason Patrick Rogers 03/06/2018, 8:52 AM   Maryan RuedJason P Rogers, MD [phone]

## 2018-03-06 NOTE — Evaluation (Signed)
Occupational Therapy Evaluation Patient Details Name: Michele Bryan MRN: [id number] DOB: August 14, 1940 Today's Date: 03/06/2018    History of Present Illness Pt is a 78 y.o. F with significant PMH of MS, RA, and prior left reverse shoulder replacement 6 months ago who presents with displaced left scapular fracture, now s/p open reduction internal fixation of left scapula.    Clinical Impression   PTA, pt was living with her husband and was independent with ADLs. Pt currently requires Max A for UB bathing and dressing for adherence to shoulder precautions. Providing pt and family with education on shoulder precautions, UB ADLs, sling management, and functional transfer techniques; both verbalized understanding. Educating pt on hand and wrist exercises, and pt demonstrated understanding. Answered all questions in preparation for dc home today. Recommend dc home once medically stable per physician. All acute OT needs met and will sign off. Thank you.    Follow Up Recommendations  Follow surgeon's recommendation for DC plan and follow-up therapies;Supervision/Assistance - 24 hour    Equipment Recommendations  None recommended by OT    Recommendations for Other Services PT consult     Precautions / Restrictions Precautions Precautions: Fall Restrictions Weight Bearing Restrictions: Yes LUE Weight Bearing: Non weight bearing Other Position/Activity Restrictions: Sling wear all times ; No shoulder ROM     Mobility Bed Mobility Overal bed mobility: Modified Independent             General bed mobility comments: Able to progress to right side of bed without physical assistance  Transfers Overall transfer level: Needs assistance Equipment used: None Transfers: Sit to/from Stand Sit to Stand: Min guard         General transfer comment: Min Guard A for safety    Balance Overall balance assessment: Needs assistance Sitting-balance support: Feet supported Sitting balance-Leahy  Scale: Good     Standing balance support: Single extremity supported;During functional activity Standing balance-Leahy Scale: Fair                             ADL either performed or assessed with clinical judgement   ADL Overall ADL's : Needs assistance/impaired Eating/Feeding: Minimal assistance;Sitting Eating/Feeding Details (indicate cue type and reason): Min A to open containers Grooming: Minimal assistance;Sitting   Upper Body Bathing: Maximal assistance;With caregiver independent assisting;Sitting Upper Body Bathing Details (indicate cue type and reason): Discussed compensatory techniques for UB bathing to adhere to no shoulder ROM.  Lower Body Bathing: Moderate assistance;With caregiver independent assisting;Sit to/from stand   Upper Body Dressing : Maximal assistance;With caregiver independent assisting;Sitting Upper Body Dressing Details (indicate cue type and reason): Educated pt and family on compensatory techniques for UB dressing and sling management. Family and pt verbalized understanding Lower Body Dressing: Moderate assistance;Sit to/from stand   Toilet Transfer: Minimal assistance;Ambulation(SPC; simulated within room)         Tub/Shower Transfer Details (indicate cue type and reason): Discussed safe shower transfer Functional mobility during ADLs: Minimal assistance;Cane General ADL Comments: Pt requiring increased assistance with shoulder limitations. Famiyl very supportive and performing UB ADLs with adherance to precautions.     Vision         Perception     Praxis      Pertinent Vitals/Pain Pain Assessment: Faces Faces Pain Scale: Hurts even more Pain Location: left shoulder Pain Descriptors / Indicators: Aching;Grimacing;Guarding Pain Intervention(s): Monitored during session;Limited activity within patient's tolerance;Repositioned     Hand Dominance Right   Extremity/Trunk Assessment  Upper Extremity Assessment Upper Extremity  Assessment: LUE deficits/detail LUE Deficits / Details: No shoulder ROM per protocal. WFL hand and wrist with noted decreased strength due to RA at baseline LUE: Unable to fully assess due to immobilization LUE Coordination: decreased gross motor   Lower Extremity Assessment Lower Extremity Assessment: Defer to PT evaluation   Cervical / Trunk Assessment Cervical / Trunk Assessment: Kyphotic   Communication Communication Communication: No difficulties   Cognition Arousal/Alertness: Awake/alert Behavior During Therapy: WFL for tasks assessed/performed Overall Cognitive Status: Within Functional Limits for tasks assessed                                     General Comments  Husband and daughter in law present throughout session    Exercises Exercises: Hand exercises Hand Exercises Wrist Flexion: AROM;Left;10 reps;Seated Wrist Extension: AROM;Left;10 reps;Seated Digit Composite Flexion: AROM;Left;10 reps;Seated Composite Extension: AROM;Left;10 reps;Seated Other Exercises Other Exercises: Gentle scapular squeezes x 5  Other Exercises: Cervical retractions x 5   Shoulder Instructions      Home Living Family/patient expects to be discharged to:: Private residence Living Arrangements: Spouse/significant other Available Help at Discharge: Family;Available 24 hours/day Type of Home: House Home Access: Stairs to enter [employer] of Steps: 1   Home Layout: Two level;Able to live on main level with bedroom/bathroom     Bathroom Shower/Tub: Walk-in shower;Tub/shower unit   Bathroom Toilet: Standard     Home Equipment: Environmental consultant - 2 wheels;Shower seat;Cane - single point          Prior Functioning/Environment Level of Independence: Independent        Comments: Worked as Marine scientist Problem List: Decreased strength;Decreased range of motion;Decreased activity tolerance;Impaired balance (sitting and/or standing);Decreased knowledge  of precautions;Pain;Impaired UE functional use      OT Treatment/Interventions:      OT Goals(Current goals can be found in the care plan section) Acute Rehab OT Goals Patient Stated Goal: "return to help out some at [employer]." OT Goal Formulation: All assessment and education complete, DC therapy  OT Frequency:     Barriers to D/C:            Co-evaluation              AM-PAC OT "6 Clicks" Daily Activity     Outcome Measure Help from another person eating meals?: A Little Help from another person taking care of personal grooming?: A Little Help from another person toileting, which includes using toliet, bedpan, or urinal?: A Lot Help from another person bathing (including washing, rinsing, drying)?: A Lot Help from another person to put on and taking off regular upper body clothing?: A Lot Help from another person to put on and taking off regular lower body clothing?: A Lot 6 Click Score: 14   End of Session Equipment Utilized During Treatment: Other (comment)(Sling) Nurse Communication: Mobility status;Weight bearing status;Precautions  Activity Tolerance: Patient tolerated treatment well;Patient limited by pain Patient left: in bed;with call bell/phone within reach;with family/visitor present(At EOB)  OT Visit Diagnosis: Unsteadiness on feet (R26.81);Other abnormalities of gait and mobility (R26.89);Muscle weakness (generalized) (M62.81);Pain Pain - Right/Left: Left Pain - part of body: Shoulder                Time: 0948-1000 OT Time Calculation (min): 12 min Charges:  OT General Charges $OT Visit: 1 Visit OT Evaluation $OT Eval  Low Complexity: 1 Low  Arizbeth Cawthorn MSOT, OTR/L Acute Rehab Pager: [phone] Office: [place] 03/06/2018, 10:20 AM

## 2018-03-06 NOTE — Progress Notes (Signed)
Patient is discharged from room 3C07 at this time. Alert and in stable condition. IV site d/c'd and instructions read to patient and spouse with understanding verbalized. Left unit via wheelchair with all belongings at side. 

## 2018-03-08 ENCOUNTER — Encounter (HOSPITAL_COMMUNITY): Payer: Self-pay | Admitting: Orthopedic Surgery

## 2018-03-09 NOTE — Discharge Summary (Signed)
Orthopedic Discharge Summary        Physician Discharge Summary  Patient ID: Michele Bryan MRN: [id number] DOB/AGE: 1940-03-15 78 y.o.  Admit date: 03/05/2018 Discharge date: 03/06/18   Procedures:  Procedure(s) (LRB): OPEN REDUCTION INTERNAL FIXATION (ORIF) LEFT SCAPULA (Left)  Attending Physician:  Dr. Malon Kindle  Admission Diagnoses:   Left scapula fracture  Discharge Diagnoses:  Left scapula fracture   Past Medical History:  Diagnosis Date  . Anemia   . Arthritis   . GERD (gastroesophageal reflux disease)   . History of blood transfusion   . Hyperlipidemia   . Hypertension   . MS (multiple sclerosis) (HCC)   . Neuromuscular disorder (HCC)    multiple scleroosis/peripheral neuropathy  . PONV (postoperative nausea and vomiting)   . Rheumatoid arthritis (HCC)   . Ulcer   . Vitamin D deficiency     PCP: Sigmund Hazel, MD   Discharged Condition: good  Hospital Course:  Patient underwent the above stated procedure on 03/05/2018. Patient tolerated the procedure well and brought to the recovery room in good condition and subsequently to the floor. Patient had an uncomplicated hospital course and was stable for discharge.   Disposition:  with follow up in 2 weeks   Follow-up Information    Beverely Low, MD. Call in 2 weeks.   Specialty:  Orthopedic Surgery Why:  [phone] Contact information: [address] [place] [address] [phone]           Discharge Instructions    Call MD / Call 911   Complete by:  As directed    If you experience chest pain or shortness of breath, CALL 911 and be transported to the hospital emergency room.  If you develope a fever above 101 F, pus (white drainage) or increased drainage or redness at the wound, or calf pain, call your surgeon's office.   Constipation Prevention   Complete by:  As directed    Drink plenty of fluids.  Prune juice may be helpful.  You may use a stool softener, such as Colace  (over the counter) 100 mg twice a day.  Use MiraLax (over the counter) for constipation as needed.   Diet - low sodium heart healthy   Complete by:  As directed    Increase activity slowly as tolerated   Complete by:  As directed       Allergies as of 03/06/2018      Reactions   Codeine Nausea And Vomiting   Demerol [meperidine] Nausea And Vomiting      Medication List    TAKE these medications   Abatacept 125 MG/ML Sosy Commonly known as:  ORENCIA Inject 125 mg into the skin once a week.   ORENCIA 125 MG/ML Sosy Generic drug:  Abatacept INJECT 125 MG INTO THE SKIN ONCE A WEEK. PLEASE SEND TO PROVIDER OFFICE.   acetaminophen 650 MG CR tablet Commonly known as:  TYLENOL Take 650 mg by mouth every 8 (eight) hours as needed for pain.   alendronate 70 MG tablet Commonly known as:  FOSAMAX Take 70 mg by mouth every Tuesday.   cholecalciferol 1000 units tablet Commonly known as:  VITAMIN D Take 1,000 Units by mouth daily.   CYSTEX Liqd Take 15 mLs by mouth daily.   ferrous sulfate 325 (65 FE) MG EC tablet Take 1 tablet (325 mg total) by mouth 2 (two) times daily with a meal.   folic acid 1 MG tablet Commonly known as:  [employer]  Take 1 mg by mouth daily.   gabapentin 100 MG capsule Commonly known as:  NEURONTIN Take 1 capsule (100 mg total) by mouth 3 (three) times daily.   Krill Oil 500 MG Caps Take 500 mg by mouth daily.   losartan 50 MG tablet Commonly known as:  COZAAR Take 75 mg by mouth daily.   Magnesium 250 MG Tabs Take 250 mg by mouth daily.   methotrexate 2.5 MG tablet TAKE SIX TABLETS BY MOUTH ONCE A WEEK What changed:    how much to take  how to take this  when to take this   multivitamin with minerals Tabs tablet Take 1 tablet by mouth daily.   ondansetron 4 MG disintegrating tablet Commonly known as:  ZOFRAN ODT Take 1 tablet (4 mg total) by mouth every 8 (eight) hours as needed.   OVER THE COUNTER MEDICATION Apply 1 application  topically 4 (four) times daily as needed (for pain.). REAL TIME PAIN RELIEF HEMP OIL   traMADol 50 MG tablet Commonly known as:  ULTRAM Take 1-2 tablets (50-100 mg total) by mouth every 4 (four) hours as needed for severe pain. What changed:    how much to take  when to take this  reasons to take this   vitamin A 8000 UNIT capsule Take 8,000 Units by mouth daily.   vitamin E 400 UNIT capsule Take 400 Units by mouth daily.   zolpidem 10 MG tablet Commonly known as:  AMBIEN Take 5 mg by mouth at bedtime as needed for sleep.         Signed: Thea Gisthomas B Azell Bill 03/09/2018, 8:36 AM  [facility] is now [facility]  [place] [address], [place], [place] [address] Phone: [phone] Facebook  Instagram  [employer]  Twitter

## 2018-03-11 MED FILL — ORENCIA 125 MG/ML SYRINGE: 125 | 28 days supply | Qty: 4 | Fill #1

## 2018-03-15 NOTE — Anesthesia Postprocedure Evaluation (Signed)
Anesthesia Post Note  Patient: Michele Bryan  Procedure(s) Performed: OPEN REDUCTION INTERNAL FIXATION (ORIF) LEFT SCAPULA (Left )     Patient location during evaluation: PACU Anesthesia Type: General Level of consciousness: awake and alert Pain management: pain level controlled Vital Signs Assessment: post-procedure vital signs reviewed and stable Respiratory status: spontaneous breathing, nonlabored ventilation, respiratory function stable and patient connected to nasal cannula oxygen Cardiovascular status: blood pressure returned to baseline and stable Postop Assessment: no apparent nausea or vomiting Anesthetic complications: no    Last Vitals:  Vitals:   03/06/18 0315 03/06/18 0716  BP: 100/60 114/62  Pulse: 62 64  Resp: 16 16  Temp: 36.6 C 36.6 C  SpO2: 97% 95%    Last Pain:  Vitals:   03/06/18 1030  TempSrc:   PainSc: 4                  [name]

## 2018-04-19 NOTE — Progress Notes (Signed)
Office Visit Note  Patient: Michele Bryan             Date of Birth: 02/14/1941           MRN: [id number]             PCP: Sigmund HazelMiller, Lisa, MD Referring: Sigmund HazelMiller, Lisa, MD Visit Date: 05/03/2018 Occupation: @GUAROCC @  Subjective:  Pain in multiple joints.    History of Present Illness: Michele GinsLinda M Negro is a 78 y.o. female with history of rheumatoid arthritis. Patient reports she had left shoulder surgery twice last year due to recurrent fracture. She denies any rheumatoid arthritis flares. She reports morning stiffness of knee joints, bilateral hands and shoulder joints. She does have joint discomfort which is tolerable and takes Tylenol when needed. She reports recovery from left shoulder surgery has slowed her down and has not been as active, which she feels has contributed to the joint stiffness and discomfort. She has an appointment with her orthopedist surgeon May 18 2018 and plans are to remove the sling. Her most recent orthopedic visit was 2 weeks ago, and she had x-rays done which showed good healing. She had bone density scan in 01/2018 and will follow-up with her PCP. She did receive her flu vaccine.      Activities of Daily Living:  Patient reports morning stiffness for 30 minutes.   Patient Denies nocturnal pain.  Difficulty dressing/grooming: Denies Difficulty climbing stairs: Denies Difficulty getting out of chair: Denies Difficulty using hands for taps, buttons, cutlery, and/or writing: Reports  Review of Systems  Constitutional: Negative for fatigue, night sweats, weight gain and weight loss.  HENT: Negative for mouth sores, trouble swallowing, trouble swallowing, mouth dryness and nose dryness.   Eyes: Negative for pain, redness, itching, visual disturbance and dryness.  Respiratory: Negative for cough, shortness of breath, wheezing and difficulty breathing.   Cardiovascular: Negative for chest pain, palpitations, hypertension, irregular heartbeat and swelling in  legs/feet.  Gastrointestinal: Negative for blood in stool, constipation, diarrhea, nausea and vomiting.  Endocrine: Negative for increased urination.  Genitourinary: Negative for painful urination and vaginal dryness.  Musculoskeletal: Positive for morning stiffness. Negative for arthralgias, joint pain, joint swelling, myalgias, muscle weakness, muscle tenderness and myalgias.  Skin: Negative for color change, rash, hair loss, skin tightness, ulcers and sensitivity to sunlight.  Allergic/Immunologic: Negative for susceptible to infections.  Neurological: Negative for dizziness, light-headedness, headaches, memory loss, night sweats and weakness.  Hematological: Negative for bruising/bleeding tendency and swollen glands.  Psychiatric/Behavioral: Negative for depressed mood, confusion and sleep disturbance. The patient is not nervous/anxious.     PMFS History:  Patient Active Problem List   Diagnosis Date Noted  . Scapula fracture 03/05/2018  . Chronic left shoulder pain 11/25/2017  . Left cervical radiculopathy 09/29/2017  . 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 High risk medication use 05/06/2016  . Primary osteoarthritis of both hands 05/06/2016  . Primary osteoarthritis of left knee 05/06/2016  . Primary osteoarthritis of both feet 05/06/2016  . Dyslipidemia 05/06/2016  . Peripheral neuropathy 05/06/2016  . Diverticulosis of intestine without bleeding 05/06/2016  . Osteopenia  05/06/2016  . Vitamin D deficiency 05/06/2016  . 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 History of GI bleed 05/06/2016  . Cataract of both eyes 05/06/2016  . Urinary urgency 04/30/2015  . Chronic constipation 04/30/2015  . Multiple sclerosis (HCC) 03/23/2013  . Expected blood loss  anemia 06/16/2012  . 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 Anemia     . Arthritis   . GERD (gastroesophageal reflux disease)   . History of blood transfusion   . Hyperlipidemia   . Hypertension   . MS (multiple sclerosis) (HCC)   . Neuromuscular disorder (HCC)    multiple scleroosis/peripheral neuropathy  . PONV (postoperative nausea and vomiting)   . Rheumatoid arthritis (HCC)   . Ulcer   . Vitamin D deficiency     Family History  Problem Relation Age of Onset  . Heart attack Father   . Breast cancer Neg Hx    Past Surgical History:  Procedure Laterality Date  . AUGMENTATION MAMMAPLASTY    . breast augumentation    . BREAST SURGERY     biopsy  . COLONOSCOPY    . DILATION AND CURETTAGE OF UTERUS    . EYE SURGERY     both eys  . KNEE ARTHROSCOPY Right   . ORIF SHOULDER FRACTURE Left 03/05/2018   Procedure: OPEN REDUCTION INTERNAL FIXATION (ORIF) LEFT SCAPULA;  Surgeon: Beverely Low, MD;  Location: [facility] OR;  Service: Orthopedics;  Laterality: Left;  . REVERSE SHOULDER ARTHROPLASTY Left 05/15/2017   Procedure: LEFT REVERSE SHOULDER ARTHROPLASTY;  Surgeon: Beverely Low, MD;  Location: [facility] OR;  Service: Orthopedics;  Laterality: Left;  . THYROID LOBECTOMY    . TOTAL KNEE ARTHROPLASTY Right 06/15/2012   Procedure: RIGHT TOTAL KNEE ARTHROPLASTY;  Surgeon: [name], MD;  Location: [facility] ORS;  Service: Orthopedics;  Laterality: Right;  . WRIST SURGERY Left    Social History   Social History Narrative   Patient lives at home with her husband [name]).   Retired.   Education- college.   Caffeine- Two cups of decaf tea daily.   Right handed.         Immunization History  Administered Date(s) Administered  . Influenza, High Dose Seasonal PF 12/03/2013  . Influenza,inj,quad, With Preservative 11/23/2014  . Influenza-Unspecified 12/19/2017  . Td 12/15/2017  . Zoster Recombinat (Shingrix) 08/10/2017, 01/05/2018     Objective: Vital Signs: BP 134/71 (BP Location: Right Arm, Patient Position: Sitting, Cuff Size: Normal)   Pulse 71   Resp 13    Ht 5\' 4"  (1.626 m)   Wt 137 lb (62.1 kg)   BMI 23.52 kg/m    Physical Exam Vitals signs and nursing note reviewed.  Constitutional:      Appearance: She is well-developed.  HENT:     Head: Normocephalic and atraumatic.  Eyes:     Conjunctiva/sclera: Conjunctivae normal.  Neck:     Musculoskeletal: Normal range of motion.  Cardiovascular:     Rate and Rhythm: Normal rate and regular rhythm.     Heart sounds: Normal heart sounds.  Pulmonary:     Effort: Pulmonary effort is normal.     Breath sounds: Normal breath sounds.  Abdominal:     General: Bowel sounds are normal.     Palpations: Abdomen is soft.  Lymphadenopathy:     Cervical: No cervical adenopathy.  Skin:    General: Skin is warm and dry.     Capillary Refill: Capillary refill takes less than 2 seconds.  Neurological:     Mental Status: She is alert and oriented to person, place, and time.  Psychiatric:        Behavior: Behavior normal.      Musculoskeletal Exam: C-spine limited range of motion.  She has  significant thoracic kyphosis.  Lumbar spine limited range of motion.  Shoulder joint range of motion is difficult to assess that she is in the left shoulder joint swelling.  Elbow joints with good range of motion.  She has limited range of motion of her wrist joints.  She has PIP and DIP thickening and subluxation of PIP joints.  She has synovial thickening over MCP joints and PIP joints.  Knee joints with good range of motion.  She has discomfort range of motion of her knee joints.  Right total knee replacement is doing well.  CDAI Exam: CDAI Score: 4.8  Patient Global Assessment: 4 (mm); Provider Global Assessment: 4 (mm) Swollen: 4 ; Tender: 0  Joint Exam      Right  Left  MCP 2  Swollen      PIP 2     Swollen   PIP 3  Swollen   Swollen      Investigation: No additional findings.  Imaging: No results found.  Recent Labs: Lab Results  Component Value Date   WBC 6.3 03/02/2018   HGB 10.3 (L)  03/06/2018   PLT 225 03/02/2018   NA 138 03/06/2018   K 3.8 03/06/2018   CL 105 03/06/2018   CO2 26 03/06/2018   GLUCOSE 100 (H) 03/06/2018   BUN 11 03/06/2018   CREATININE 0.88 03/06/2018   BILITOT 0.5 01/14/2018   ALKPHOS 52 10/09/2016   AST 15 01/14/2018   ALT 12 01/14/2018   PROT 6.6 01/14/2018   ALBUMIN 4.5 10/09/2016   CALCIUM 8.9 03/06/2018   GFRAA >60 03/06/2018   QFTBGOLDPLUS NEGATIVE 10/30/2017    Speciality Comments: PLQ Eye Exam: 07/30/17 WNL @ [facility]  Procedures:  No procedures performed Allergies: Codeine and Demerol [meperidine]   Assessment / Plan:     Visit Diagnoses: 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 -patient continues to have some synovial thickening and mild synovitis.  Although overall she is doing much better on Orencia and methotrexate combination.  Her labs have been stable.  We will check labs today.  High risk medication use - Orencia 125 mg subq weekly started on 12/08/17,  (Plaquenil was discontinued), methotrexate 6 tablets weekly, and folic acid 1 mg daily.  Last TB gold negative October 13, 2017.  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  Primary osteoarthritis of both hands-she has severe osteoarthritis with subluxation of PIP and DIP joints.  Status post right knee replacement-she continues to have some chronic discomfort.  Primary osteoarthritis of left knee-she has chronic pain but no synovitis was noted.  Primary osteoarthritis of both feet  Postural kyphosis of thoracic region-posture correction was discussed.  Osteopenia of multiple sites -  Fosamax 70 mg and vitamin D supplements managed by her PCP.  Patient states her most recent bone density was consistent with osteopenia.  Osteoporosis managed  by her PCP.  Other medical problems are listed as follows:  History of multiple sclerosis  History of diverticulosis  Dyslipidemia  History of vitamin D deficiency  History of GI bleed  History of peripheral neuropathy  History of cataract   Orders: Orders Placed This Encounter  Procedures  . CBC with Differential/Platelet  . COMPLETE METABOLIC PANEL WITH GFR   No orders of the defined types were placed in this encounter.    Follow-Up  Instructions: Return in about 5 months (around 10/03/2018) for Rheumatoid arthritis, Osteoarthritis.   Pollyann Savoy, MD  Note - This record has been created using Animal nutritionist.  Chart creation errors have been sought, but may not always  have been located. Such creation errors do not reflect on  the standard of medical care.

## 2018-05-03 ENCOUNTER — Encounter: Payer: Self-pay | Admitting: Physician Assistant

## 2018-05-03 ENCOUNTER — Ambulatory Visit: Payer: Medicare Other | Admitting: Rheumatology

## 2018-05-03 VITALS — BP 134/71 | HR 71 | Resp 13 | Ht 64.0 in | Wt 137.0 lb

## 2018-05-03 DIAGNOSIS — E785 Hyperlipidemia, unspecified: Secondary | ICD-10-CM

## 2018-05-03 DIAGNOSIS — Z8639 Personal history of other endocrine, nutritional and metabolic disease: Secondary | ICD-10-CM

## 2018-05-03 DIAGNOSIS — Z79899 Other long term (current) drug therapy: Secondary | ICD-10-CM

## 2018-05-03 DIAGNOSIS — Z8669 Personal history of other diseases of the nervous system and sense organs: Secondary | ICD-10-CM

## 2018-05-03 DIAGNOSIS — M19041 Primary osteoarthritis, right hand: Secondary | ICD-10-CM

## 2018-05-03 DIAGNOSIS — G35 Multiple sclerosis: Secondary | ICD-10-CM

## 2018-05-03 DIAGNOSIS — M8589 Other specified disorders of bone density and structure, multiple sites: Secondary | ICD-10-CM

## 2018-05-03 DIAGNOSIS — M0579 Rheumatoid arthritis with rheumatoid factor of multiple sites without organ or systems involvement: Secondary | ICD-10-CM

## 2018-05-03 DIAGNOSIS — Z8719 Personal history of other diseases of the digestive system: Secondary | ICD-10-CM

## 2018-05-03 DIAGNOSIS — M19071 Primary osteoarthritis, right ankle and foot: Secondary | ICD-10-CM

## 2018-05-03 DIAGNOSIS — M19042 Primary osteoarthritis, left hand: Secondary | ICD-10-CM

## 2018-05-03 DIAGNOSIS — Z96651 Presence of right artificial knee joint: Secondary | ICD-10-CM

## 2018-05-03 DIAGNOSIS — M1712 Unilateral primary osteoarthritis, left knee: Secondary | ICD-10-CM

## 2018-05-03 DIAGNOSIS — Z96612 Presence of left artificial shoulder joint: Secondary | ICD-10-CM | POA: Diagnosis not present

## 2018-05-03 DIAGNOSIS — M4004 Postural kyphosis, thoracic region: Secondary | ICD-10-CM

## 2018-05-03 DIAGNOSIS — M19072 Primary osteoarthritis, left ankle and foot: Secondary | ICD-10-CM

## 2018-05-03 NOTE — Patient Instructions (Signed)
Standing Labs We placed an order today for your standing lab work.    Please come back and get your standing labs in June and every 3 months   We have open lab Monday through Friday from 8:30-11:30 AM and 1:30-4:00 PM  at the office of Dr. Antonio Creswell.   You may experience shorter wait times on Monday and Friday afternoons. The office is located at [address], [address], [place], [place] [address] No appointment is necessary.   Labs are drawn by [facility].  You may receive a bill from [facility] for your lab work.  If you wish to have your labs drawn at another location, please call the office 24 hours in advance to send orders.  If you have any questions regarding directions or hours of operation,  please call [phone].   Just as a reminder please drink plenty of water prior to coming for your lab work. Thanks!   

## 2018-05-04 LAB — COMPLETE METABOLIC PANEL WITH GFR
AG Ratio: 1.8 (calc) (ref 1.0–2.5)
ALT: 13 U/L (ref 6–29)
AST: 15 U/L (ref 10–35)
Albumin: 4.2 g/dL (ref 3.6–5.1)
Alkaline phosphatase (APISO): 70 U/L (ref 37–153)
BUN: 18 mg/dL (ref 7–25)
CO2: 25 mmol/L (ref 20–32)
Calcium: 9.6 mg/dL (ref 8.6–10.4)
Chloride: 103 mmol/L (ref 98–110)
Creat: 0.89 mg/dL (ref 0.60–0.93)
GFR, Est African American: 73 mL/min/{1.73_m2} (ref >=60)
GFR, Est Non African American: 63 mL/min/{1.73_m2} (ref >=60)
Globulin: 2.3 g/dL (calc) (ref 1.9–3.7)
Glucose, Bld: 88 mg/dL (ref 65–99)
Potassium: 4.8 mmol/L (ref 3.5–5.3)
Sodium: 137 mmol/L (ref 135–146)
Total Bilirubin: 0.4 mg/dL (ref 0.2–1.2)
Total Protein: 6.5 g/dL (ref 6.1–8.1)

## 2018-05-04 LAB — CBC WITH DIFFERENTIAL/PLATELET
Absolute Monocytes: 454 cells/uL (ref 200–950)
Basophils Absolute: 39 cells/uL (ref 0–200)
Basophils Relative: 0.7 %
Eosinophils Absolute: 62 cells/uL (ref 15–500)
Eosinophils Relative: 1.1 %
HCT: 32.7 % — ABNORMAL LOW (ref 35.0–45.0)
Hemoglobin: 10.7 g/dL — ABNORMAL LOW (ref 11.7–15.5)
Lymphs Abs: 2055 cells/uL (ref 850–3900)
MCH: 29.3 pg (ref 27.0–33.0)
MCHC: 32.7 g/dL (ref 32.0–36.0)
MCV: 89.6 fL (ref 80.0–100.0)
MPV: 9.7 fL (ref 7.5–12.5)
Monocytes Relative: 8.1 %
Neutro Abs: 2990 cells/uL (ref 1500–7800)
Neutrophils Relative %: 53.4 %
Platelets: 301 10*3/uL (ref 140–400)
RBC: 3.65 10*6/uL — ABNORMAL LOW (ref 3.80–5.10)
RDW: 13.6 % (ref 11.0–15.0)
Total Lymphocyte: 36.7 %
WBC: 5.6 10*3/uL (ref 3.8–10.8)

## 2018-05-04 NOTE — Progress Notes (Signed)
Stable, anemia

## 2018-05-06 MED FILL — ORENCIA 125 MG/ML SYRINGE: 125 | 28 days supply | Qty: 4 | Fill #2

## 2018-05-12 ENCOUNTER — Other Ambulatory Visit: Payer: Self-pay | Admitting: Rheumatology

## 2018-05-12 NOTE — Telephone Encounter (Signed)
Last Visit: 05/03/18 Next Visit: 10/04/18 Labs: 05/03/18 Stable, anemia  Okay to refill per [name]

## 2018-05-28 ENCOUNTER — Other Ambulatory Visit: Payer: Self-pay | Admitting: Rheumatology

## 2018-05-28 DIAGNOSIS — M0579 Rheumatoid arthritis with rheumatoid factor of multiple sites without organ or systems involvement: Secondary | ICD-10-CM

## 2018-05-31 MED FILL — ORENCIA 125 MG/ML SYRINGE: 125 | 84 days supply | Qty: 12 | Fill #0

## 2018-05-31 NOTE — Telephone Encounter (Signed)
Last Visit: 05/03/18 Next Visit: 10/04/18 Labs: 05/03/18 Stable, anemia Tb Gold: 10/30/17 Neg   Okay to refill per [name]

## 2018-08-12 ENCOUNTER — Other Ambulatory Visit: Payer: Self-pay | Admitting: Rheumatology

## 2018-08-12 ENCOUNTER — Other Ambulatory Visit: Payer: Self-pay | Admitting: Orthopedic Surgery

## 2018-08-12 ENCOUNTER — Other Ambulatory Visit: Payer: Self-pay

## 2018-08-12 DIAGNOSIS — Z79899 Other long term (current) drug therapy: Secondary | ICD-10-CM

## 2018-08-12 DIAGNOSIS — M25512 Pain in left shoulder: Secondary | ICD-10-CM

## 2018-08-12 DIAGNOSIS — M0579 Rheumatoid arthritis with rheumatoid factor of multiple sites without organ or systems involvement: Secondary | ICD-10-CM

## 2018-08-12 NOTE — Telephone Encounter (Signed)
Please advisel

## 2018-08-12 NOTE — Telephone Encounter (Signed)
Last Visit: 05/03/2018 Next Visit: 10/04/2018 Labs: 05/03/2018 TB Gold: 10/30/2017  She is overdue for labs.  She plans to come this afternoon for labs.  Will send in script when she comes for labs.

## 2018-08-13 LAB — COMPLETE METABOLIC PANEL WITH GFR
AG Ratio: 2 (calc) (ref 1.0–2.5)
ALT: 10 U/L (ref 6–29)
AST: 14 U/L (ref 10–35)
Albumin: 4.1 g/dL (ref 3.6–5.1)
Alkaline phosphatase (APISO): 67 U/L (ref 37–153)
BUN: 24 mg/dL (ref 7–25)
CO2: 25 mmol/L (ref 20–32)
Calcium: 9.1 mg/dL (ref 8.6–10.4)
Chloride: 104 mmol/L (ref 98–110)
Creat: 0.84 mg/dL (ref 0.60–0.93)
GFR, Est African American: 79 mL/min/{1.73_m2} (ref >=60)
GFR, Est Non African American: 68 mL/min/{1.73_m2} (ref >=60)
Globulin: 2.1 g/dL (calc) (ref 1.9–3.7)
Glucose, Bld: 100 mg/dL — ABNORMAL HIGH (ref 65–99)
Potassium: 4.4 mmol/L (ref 3.5–5.3)
Sodium: 138 mmol/L (ref 135–146)
Total Bilirubin: 0.7 mg/dL (ref 0.2–1.2)
Total Protein: 6.2 g/dL (ref 6.1–8.1)

## 2018-08-13 LAB — CBC WITH DIFFERENTIAL/PLATELET
Absolute Monocytes: 454 cells/uL (ref 200–950)
Basophils Absolute: 30 cells/uL (ref 0–200)
Basophils Relative: 0.5 %
Eosinophils Absolute: 142 cells/uL (ref 15–500)
Eosinophils Relative: 2.4 %
HCT: 35 % (ref 35.0–45.0)
Hemoglobin: 11.4 g/dL — ABNORMAL LOW (ref 11.7–15.5)
Lymphs Abs: 1575 cells/uL (ref 850–3900)
MCH: 29.5 pg (ref 27.0–33.0)
MCHC: 32.6 g/dL (ref 32.0–36.0)
MCV: 90.4 fL (ref 80.0–100.0)
MPV: 9.8 fL (ref 7.5–12.5)
Monocytes Relative: 7.7 %
Neutro Abs: 3699 cells/uL (ref 1500–7800)
Neutrophils Relative %: 62.7 %
Platelets: 228 10*3/uL (ref 140–400)
RBC: 3.87 10*6/uL (ref 3.80–5.10)
RDW: 14.8 % (ref 11.0–15.0)
Total Lymphocyte: 26.7 %
WBC: 5.9 10*3/uL (ref 3.8–10.8)

## 2018-08-13 NOTE — Progress Notes (Signed)
Labs are stable.

## 2018-08-16 ENCOUNTER — Other Ambulatory Visit: Payer: Self-pay | Admitting: Rheumatology

## 2018-08-16 ENCOUNTER — Other Ambulatory Visit: Payer: Self-pay | Admitting: Orthopedic Surgery

## 2018-08-16 DIAGNOSIS — M25512 Pain in left shoulder: Secondary | ICD-10-CM

## 2018-08-16 NOTE — Telephone Encounter (Signed)
Last Visit: 05/03/2018 Next Visit: 10/04/2018 Labs: 08/12/2018 stable   Okay to refill per Dr. Estanislado Pandy.

## 2018-08-18 ENCOUNTER — Other Ambulatory Visit: Payer: Self-pay

## 2018-08-18 ENCOUNTER — Ambulatory Visit
Admission: RE | Admit: 2018-08-18 | Discharge: 2018-08-18 | Disposition: A | Discharge status: Home / Self Care | Payer: Medicare Other | Source: Ambulatory Visit | Attending: Orthopedic Surgery | Admitting: Orthopedic Surgery

## 2018-08-18 DIAGNOSIS — M25512 Pain in left shoulder: Secondary | ICD-10-CM

## 2018-08-27 MED FILL — ORENCIA 125 MG/ML SYRINGE: 125 | 84 days supply | Qty: 12 | Fill #0

## 2018-09-17 ENCOUNTER — Telehealth: Payer: Self-pay | Admitting: Rheumatology

## 2018-09-17 NOTE — Telephone Encounter (Signed)
Returned patient's call.  Confirmed that she has surgery scheduled on 10/15/2018.  She injects Orencia every Tuesday and takes her methotrexate on Friday.  Instructed patient that her last dose of Orencia would be on 7/28 and her last dose of methotrexate should be 7/31.  Patient verbalized understanding.  Informed patient that we will need to consult with surgeon and get clearance on when she can resume the medications post surgery.  Patient verbalized understanding.  All questions encouraged and answered.  Instructed patient to call with any further questions or concerns.  Mariella Saa, PharmD, [facility] Rheumatology Clinical Pharmacist  09/17/2018 3:44 PM

## 2018-09-17 NOTE — Telephone Encounter (Signed)
Patient called stating she is scheduled for shoulder surgery on 10/15/18 and is requesting a return call to let her know when she should stop taking her Methotrexate and Orencia.

## 2018-09-20 ENCOUNTER — Other Ambulatory Visit: Payer: Self-pay | Admitting: *Deleted

## 2018-09-20 MED ORDER — GABAPENTIN 100 MG PO CAPS: 100.0000 mg | ORAL_CAPSULE | Freq: Three times a day (TID) | ORAL | 11 refills | Status: DC

## 2018-09-30 ENCOUNTER — Telehealth: Payer: Self-pay | Admitting: Pharmacist

## 2018-09-30 NOTE — Telephone Encounter (Signed)
Received a fax from [facility] regarding a prior authorization for [facility]. Authorization has been APPROVED from 09/30/18 to 03/03/19.   Will send document to scan center.  Authorization # [id number] Phone # [phone]  11:50 AM Beatriz Chancellor, CPhT

## 2018-09-30 NOTE — Telephone Encounter (Signed)
Received a fax from [facility] regarding a prior authorization for [facility]. Authorization has been APPROVED from 09/30/2018 to 03/03/2019.   Will send document to scan center.  Authorization # [id number] Phone # [phone]

## 2018-09-30 NOTE — Progress Notes (Deleted)
Office Visit Note  Patient: Michele Bryan             Date of Birth: [birth year]           MRN: [id number]             PCP: Sigmund Hazel, MD Referring: Sigmund Hazel, MD Visit Date: 10/14/2018 Occupation: @GUAROCC @  Subjective:  No chief complaint on file.  Orenica and MTX currently on hold due to surgery.  Orencia 125 mg every 7 days and methotrexate 2.5 mg 6 tablets every 7 days.  Last TB gold negative on 10/30/2017 and will monitor yearly.  Future order for TB Gold place.  Most recent CBC/CMP stable on 08/12/2018.  Due for CBC/CMP in September and will monitor every 3 months.  Standing orders placed.  She received the flu vaccine in October and both doses of Shingrix vaccine.  Recommend annual flu, Prevnar 13, and Pneumovax 23 as indicated for immunosuppressant therapy.  Osteoporosis managed by her PCP.  She is on Fosamax 70 mg weekly and vitamin D daily.  History of Present Illness: Michele Bryan is a 78 y.o. female ***   Activities of Daily Living:  Patient reports morning stiffness for *** {minute/hour:19697}.   Patient {ACTIONS;DENIES/REPORTS:21021675::"Denies"} nocturnal pain.  Difficulty dressing/grooming: {ACTIONS;DENIES/REPORTS:21021675::"Denies"} Difficulty climbing stairs: {ACTIONS;DENIES/REPORTS:21021675::"Denies"} Difficulty getting out of chair: {ACTIONS;DENIES/REPORTS:21021675::"Denies"} Difficulty using hands for taps, buttons, cutlery, and/or writing: {ACTIONS;DENIES/REPORTS:21021675::"Denies"}  No Rheumatology ROS completed.   PMFS History:  Patient Active Problem List   Diagnosis Date Noted  . Scapula fracture 03/05/2018  . Chronic left shoulder pain 11/25/2017  . Left cervical radiculopathy 09/29/2017  . 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 High risk medication use 05/06/2016  . Primary osteoarthritis of both hands 05/06/2016  . Primary  osteoarthritis of left knee 05/06/2016  . Primary osteoarthritis of both feet 05/06/2016  . Dyslipidemia 05/06/2016  . Peripheral neuropathy 05/06/2016  . Diverticulosis of intestine without bleeding 05/06/2016  . Osteopenia  05/06/2016  . Vitamin D deficiency 05/06/2016  . 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 History of GI bleed 05/06/2016  . Cataract of both eyes 05/06/2016  . Urinary urgency 04/30/2015  . Chronic constipation 04/30/2015  . Multiple sclerosis (HCC) 03/23/2013  . Expected blood loss anemia 06/16/2012  . 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 Anemia   . Arthritis   . GERD (gastroesophageal reflux disease)   . History of blood transfusion   . Hyperlipidemia   . Hypertension   . MS (multiple sclerosis) (HCC)   . Neuromuscular disorder (HCC)    multiple scleroosis/peripheral neuropathy  . PONV (postoperative nausea and vomiting)   . Rheumatoid arthritis (HCC)   . Ulcer   . Vitamin D deficiency     Family History  Problem Relation Age of Onset  . Heart attack Father   . Breast cancer Neg Hx    Past Surgical History:  Procedure Laterality Date  . AUGMENTATION MAMMAPLASTY    . breast augumentation    . BREAST SURGERY     biopsy  . COLONOSCOPY    . DILATION AND CURETTAGE OF UTERUS    . EYE SURGERY     both eys  . KNEE ARTHROSCOPY Right   . ORIF SHOULDER FRACTURE Left 03/05/2018   Procedure: OPEN REDUCTION INTERNAL FIXATION (ORIF) LEFT SCAPULA;  Surgeon: 05/04/2018, MD;  Location:  [facility];  Service: Orthopedics;  Laterality: Left;  . REVERSE SHOULDER ARTHROPLASTY Left 05/15/2017   Procedure: LEFT REVERSE SHOULDER ARTHROPLASTY;  Surgeon: Netta Cedars, MD;  Location: [facility];  Service: Orthopedics;  Laterality: Left;  . THYROID LOBECTOMY    . TOTAL KNEE ARTHROPLASTY Right 06/15/2012   Procedure: RIGHT TOTAL KNEE ARTHROPLASTY;  Surgeon: Mauri Pole, MD;  Location: [facility];  Service: Orthopedics;  Laterality: Right;   . WRIST SURGERY Left    Social History   Social History Narrative   Patient lives at home with her husband [name]).   Retired.   Education- college.   Caffeine- Two cups of decaf tea daily.   Right handed.         Immunization History  Administered Date(s) Administered  . Influenza, High Dose Seasonal PF 12/03/2013  . Influenza,inj,quad, With Preservative 11/23/2014  . Influenza-Unspecified 12/19/2017  . Td 12/15/2017  . Zoster Recombinat (Shingrix) 08/10/2017, 01/05/2018     Objective: Vital Signs: There were no vitals taken for this visit.   Physical Exam   Musculoskeletal Exam: ***  CDAI Exam: CDAI Score: - Patient Global: -; Provider Global: - Swollen: -; Tender: - Joint Exam   No joint exam has been documented for this visit   There is currently no information documented on the homunculus. Go to the Rheumatology activity and complete the homunculus joint exam.  Investigation: No additional findings.  Imaging: No results found.  Recent Labs: Lab Results  Component Value Date   WBC 5.9 08/12/2018   HGB 11.4 (L) 08/12/2018   PLT 228 08/12/2018   NA 138 08/12/2018   K 4.4 08/12/2018   CL 104 08/12/2018   CO2 25 08/12/2018   GLUCOSE 100 (H) 08/12/2018   BUN 24 08/12/2018   CREATININE 0.84 08/12/2018   BILITOT 0.7 08/12/2018   ALKPHOS 52 10/09/2016   AST 14 08/12/2018   ALT 10 08/12/2018   PROT 6.2 08/12/2018   ALBUMIN 4.5 10/09/2016   CALCIUM 9.1 08/12/2018   GFRAA 79 08/12/2018   QFTBGOLDPLUS NEGATIVE 10/30/2017    Speciality Comments: PLQ Eye Exam: 07/30/17 WNL @ [facility]  Procedures:  No procedures performed Allergies: Codeine and Demerol [meperidine]   Assessment / Plan:     Visit Diagnoses: No diagnosis found.  Orders: No orders of the defined types were placed in this encounter.  No orders of the defined types were placed in this encounter.   Face-to-face time spent with patient was *** minutes. Greater than 50% of time  was spent in counseling and coordination of care.  Follow-Up Instructions: No follow-ups on file.   Earnestine Mealing, CMA  Note - This record has been created using Editor, commissioning.  Chart creation errors have been sought, but may not always  have been located. Such creation errors do not reflect on  the standard of medical care.

## 2018-10-04 ENCOUNTER — Ambulatory Visit: Payer: Self-pay | Admitting: Rheumatology

## 2018-10-05 NOTE — Patient Instructions (Addendum)
YOU NEED TO HAVE A COVID 19 TEST ON 10-07-18  @ 11:00 AM. THIS TEST MUST BE DONE BEFORE SURGERY, COME  [place], [place] , [address]. ONCE YOUR COVID TEST IS COMPLETED, PLEASE BEGIN THE QUARANTINE INSTRUCTIONS AS OUTLINED IN YOUR HANDOUT.                DUSTINA SCOGGIN  10/05/2018   Your procedure is scheduled on: 10-11-18   Report to [facility] Main  Entrance    Report to [place] at 5:30 AM   1 VISITOR IS ALLOWED TO WAIT IN WAITING ROOM  ONLY DAY OF YOUR SURGERY.    Call this number if you have problems the morning of surgery [phone]    Remember: Do not eat food or drink liquids :After Midnight.    Take these medicines the morning of surgery with A SIP OF WATER: Gabapentin (Neurontin), and Omeprazole (Prilosec)  BRUSH YOUR TEETH MORNING OF SURGERY AND RINSE YOUR MOUTH OUT, NO CHEWING GUM CANDY OR MINTS.                              You may not have any metal on your body including hair pins and              piercings     Do not wear jewelry, make-up, lotions, powders or perfumes, deodorant              Do not wear nail polish.  Do not shave  48 hours prior to surgery.             Do not bring valuables to the hospital. [place].  Contacts, dentures or bridgework may not be worn into surgery.    Special Instructions: N/A              Please read over the following fact sheets you were given: _____________________________________________________________________             [facility] - Preparing for Surgery Before surgery, you can play an important role.  Because skin is not sterile, your skin needs to be as free of germs as possible.  You can reduce the number of germs on your skin by washing with CHG (chlorahexidine gluconate) soap before surgery.  CHG is an antiseptic cleaner which kills germs and bonds with the skin to continue killing germs even after washing. Please DO NOT use if you have  an allergy to CHG or antibacterial soaps.  If your skin becomes reddened/irritated stop using the CHG and inform your nurse when you arrive at Short Stay. Do not shave (including legs and underarms) for at least 48 hours prior to the first CHG shower.  You may shave your face/neck. Please follow these instructions carefully:  1.  Shower with CHG Soap the night before surgery and the  morning of Surgery.  2.  If you choose to wash your hair, wash your hair first as usual with your  normal  shampoo.  3.  After you shampoo, rinse your hair and body thoroughly to remove the  shampoo.                           4.  Use CHG as you would any other liquid soap.  You  can apply chg directly  to the skin and wash                       Gently with a scrungie or clean washcloth.  5.  Apply the CHG Soap to your body ONLY FROM THE NECK DOWN.   Do not use on face/ open                           Wound or open sores. Avoid contact with eyes, ears mouth and genitals (private parts).                       Wash face,  Genitals (private parts) with your normal soap.             6.  Wash thoroughly, paying special attention to the area where your surgery  will be performed.  7.  Thoroughly rinse your body with warm water from the neck down.  8.  DO NOT shower/wash with your normal soap after using and rinsing off  the CHG Soap.                9.  Pat yourself dry with a clean towel.            10.  Wear clean pajamas.            11.  Place clean sheets on your bed the night of your first shower and do not  sleep with pets. Day of Surgery : Do not apply any lotions/deodorants the morning of surgery.  Please wear clean clothes to the hospital/surgery center.  FAILURE TO FOLLOW THESE INSTRUCTIONS MAY RESULT IN THE CANCELLATION OF YOUR SURGERY PATIENT SIGNATURE_________________________________  NURSE SIGNATURE__________________________________  ________________________________________________________________________

## 2018-10-05 NOTE — Progress Notes (Signed)
03-02-18 ( Epic) EKG

## 2018-10-05 NOTE — Progress Notes (Signed)
Please place surgery orders. Pt is scheduled for her PAT appt on 10-06-18.

## 2018-10-06 ENCOUNTER — Encounter (HOSPITAL_COMMUNITY): Payer: Self-pay

## 2018-10-06 ENCOUNTER — Encounter (HOSPITAL_COMMUNITY)
Admission: RE | Admit: 2018-10-06 | Discharge: 2018-10-06 | Disposition: A | Discharge status: Home / Self Care | Payer: Medicare Other | Source: Ambulatory Visit | Attending: Orthopedic Surgery | Admitting: Orthopedic Surgery

## 2018-10-06 ENCOUNTER — Other Ambulatory Visit: Payer: Self-pay

## 2018-10-06 DIAGNOSIS — Z20828 Contact with and (suspected) exposure to other viral communicable diseases: Secondary | ICD-10-CM | POA: Diagnosis not present

## 2018-10-06 DIAGNOSIS — Z01812 Encounter for preprocedural laboratory examination: Secondary | ICD-10-CM | POA: Insufficient documentation

## 2018-10-06 LAB — CBC
HCT: 37.7 % (ref 36.0–46.0)
Hemoglobin: 11.6 g/dL — ABNORMAL LOW (ref 12.0–15.0)
MCH: 30.1 pg (ref 26.0–34.0)
MCHC: 30.8 g/dL (ref 30.0–36.0)
MCV: 97.7 fL (ref 80.0–100.0)
Platelets: 224 10*3/uL (ref 150–400)
RBC: 3.86 MIL/uL — ABNORMAL LOW (ref 3.87–5.11)
RDW: 14.9 % (ref 11.5–15.5)
WBC: 6.7 10*3/uL (ref 4.0–10.5)
nRBC: 0 % (ref 0.0–0.2)

## 2018-10-06 LAB — BASIC METABOLIC PANEL
Anion gap: 8 (ref 5–15)
BUN: 19 mg/dL (ref 8–23)
CO2: 24 mmol/L (ref 22–32)
Calcium: 9.4 mg/dL (ref 8.9–10.3)
Chloride: 106 mmol/L (ref 98–111)
Creatinine, Ser: 0.89 mg/dL (ref 0.44–1.00)
GFR calc Af Amer: >60 mL/min (ref >60)
GFR calc non Af Amer: >60 mL/min (ref >60)
Glucose, Bld: 89 mg/dL (ref 70–99)
Potassium: 4.8 mmol/L (ref 3.5–5.1)
Sodium: 138 mmol/L (ref 135–145)

## 2018-10-06 LAB — SURGICAL PCR SCREEN
MRSA, PCR: NEGATIVE
Staphylococcus aureus: NEGATIVE

## 2018-10-07 ENCOUNTER — Other Ambulatory Visit (HOSPITAL_COMMUNITY): Payer: Medicare Other

## 2018-10-07 DIAGNOSIS — Z01812 Encounter for preprocedural laboratory examination: Secondary | ICD-10-CM | POA: Diagnosis not present

## 2018-10-07 LAB — SARS CORONAVIRUS 2 (TAT 6-24 HRS): SARS Coronavirus 2: NEGATIVE

## 2018-10-07 NOTE — H&P (Signed)
Patient's anticipated LOS is less than 2 midnights, meeting these requirements: - Younger than 6 - Lives within 1 hour of care - Has a competent adult at home to recover with post-op recover - NO history of  - Chronic pain requiring opiods  - Diabetes  - Coronary Artery Disease  - Heart failure  - Heart attack  - Stroke  - DVT/VTE  - Cardiac arrhythmia  - Respiratory Failure/COPD  - Renal failure  - Anemia  - Advanced Liver disease       Michele Bryan is an 78 y.o. female.    Chief Complaint: left shoulder pain  HPI: Pt is a 78 y.o. female complaining of left shoulder pain for multiple years. Pain had continually increased since the beginning. X-rays in the clinic show previous left total shoulder. Pt has tried various conservative treatments which have failed to alleviate their symptoms, including injections and therapy. Various options are discussed with the patient. Risks, benefits and expectations were discussed with the patient. Patient understand the risks, benefits and expectations and wishes to proceed with surgery.   PCP:  Kathyrn Lass, MD  D/C Plans: Home  PMH: Past Medical History:  Diagnosis Date   Anemia    Arthritis    GERD (gastroesophageal reflux disease)    History of blood transfusion    Hyperlipidemia    Hypertension    MS (multiple sclerosis) (HCC)    Neuromuscular disorder (HCC)    multiple scleroosis/peripheral neuropathy   PONV (postoperative nausea and vomiting)    Rheumatoid arthritis (HCC)    Ulcer    Vitamin D deficiency     PSH: Past Surgical History:  Procedure Laterality Date   AUGMENTATION MAMMAPLASTY     breast augumentation     BREAST SURGERY     biopsy   COLONOSCOPY     DILATION AND CURETTAGE OF UTERUS     EYE SURGERY     both eys   KNEE ARTHROSCOPY Right    ORIF SHOULDER FRACTURE Left 03/05/2018   Procedure: OPEN REDUCTION INTERNAL FIXATION (ORIF) LEFT SCAPULA;  Surgeon: Netta Cedars, MD;   Location: [facility];  Service: Orthopedics;  Laterality: Left;   REVERSE SHOULDER ARTHROPLASTY Left 05/15/2017   Procedure: LEFT REVERSE SHOULDER ARTHROPLASTY;  Surgeon: Netta Cedars, MD;  Location: [facility];  Service: Orthopedics;  Laterality: Left;   THYROID LOBECTOMY     TOTAL KNEE ARTHROPLASTY Right 06/15/2012   Procedure: RIGHT TOTAL KNEE ARTHROPLASTY;  Surgeon: Mauri Pole, MD;  Location: [facility];  Service: Orthopedics;  Laterality: Right;   WRIST SURGERY Left     Social History:  reports that she has never smoked. She has never used smokeless tobacco. She reports that she does not drink alcohol or use drugs.  Allergies:  Allergies  Allergen Reactions   Codeine Nausea And Vomiting   Demerol [Meperidine] Nausea And Vomiting    Medications: No current facility-administered medications for this encounter.    Current Outpatient Medications  Medication Sig Dispense Refill   acetaminophen (TYLENOL) 650 MG CR tablet Take 1,300 mg by mouth every 8 (eight) hours as needed for pain.      alendronate (FOSAMAX) 70 MG tablet Take 70 mg by mouth every Tuesday. Take with a full glass of water on an empty stomach.     CHELATED IRON PO Take 1 tablet by mouth daily.     Cholecalciferol (VITAMIN D) 125 MCG (5000 UT) CAPS Take 5,000 Units by mouth daily.      Cyanocobalamin (  VITAMIN B-12) 5000 MCG TBDP Take 5,000 mcg by mouth daily.     doxylamine, Sleep, (UNISOM) 25 MG tablet Take 25 mg by mouth at bedtime as needed for sleep.     gabapentin (NEURONTIN) 100 MG capsule Take 1 capsule (100 mg total) by mouth 3 (three) times daily. 90 capsule 11   hydroxychloroquine (PLAQUENIL) 200 MG tablet Take 200 mg by mouth daily.     Krill Oil 500 MG CAPS Take 500 mg by mouth daily.     losartan (COZAAR) 50 MG tablet Take 50 mg by mouth 2 (two) times daily.      methotrexate 2.5 MG tablet TAKE 6 TABLETS BY MOUTH ONCE A WEEK (Patient taking differently: Take 15 mg by mouth every Friday. ) 72 tablet 0    Misc Natural Products (CYSTEX) LIQD Take 15 mLs by mouth daily as needed (for UTI).      Multiple Vitamin (MULTIVITAMIN WITH MINERALS) TABS Take 1 tablet by mouth daily.     omeprazole (PRILOSEC) 40 MG capsule Take 40 mg by mouth daily.     ORENCIA 125 MG/ML SOSY INJECT 125 MG INTO THE SKIN ONCE A WEEK. (Patient taking differently: Inject 125 mg into the skin every Tuesday. ) 12 mL 0   traMADol (ULTRAM) 50 MG tablet Take 1-2 tablets (50-100 mg total) by mouth every 4 (four) hours as needed for severe pain. (Patient taking differently: Take 50 mg by mouth every 6 (six) hours as needed for severe pain. ) 60 tablet 0   vitamin E 400 UNIT capsule Take 400 Units by mouth daily.     zolpidem (AMBIEN) 10 MG tablet Take 5 mg by mouth at bedtime as needed for sleep.       Results for orders placed or performed during the hospital encounter of 10/06/18 (from the past 48 hour(s))  Surgical pcr screen     Status: None   Collection Time: 10/06/18  2:58 PM   Specimen: Nasal Mucosa; Nasal Swab  Result Value Ref Range   MRSA, PCR NEGATIVE NEGATIVE   Staphylococcus aureus NEGATIVE NEGATIVE    Comment: (NOTE) The Xpert SA Assay (FDA approved for NASAL specimens in patients 15 years of age and older), is one component of a comprehensive surveillance program. It is not intended to diagnose infection nor to guide or monitor treatment. Performed at [facility], [place] [address], [place], [place] [address]   Basic metabolic panel     Status: None   Collection Time: 10/06/18  2:58 PM  Result Value Ref Range   Sodium 138 135 - 145 mmol/L   Potassium 4.8 3.5 - 5.1 mmol/L   Chloride 106 98 - 111 mmol/L   CO2 24 22 - 32 mmol/L   Glucose, Bld 89 70 - 99 mg/dL   BUN 19 8 - 23 mg/dL   Creatinine, Ser 0.89 0.44 - 1.00 mg/dL   Calcium 9.4 8.9 - 10.3 mg/dL   GFR calc non Af Amer >60 >60 mL/min   GFR calc Af Amer >60 >60 mL/min   Anion gap 8 5 - 15    Comment: Performed at [facility], [place] [address], [place], [place] [address]  CBC     Status: Abnormal   Collection Time: 10/06/18  2:58 PM  Result Value Ref Range   WBC 6.7 4.0 - 10.5 K/uL   RBC 3.86 (L) 3.87 - 5.11 MIL/uL   Hemoglobin 11.6 (L) 12.0 - 15.0 g/dL   HCT 37.7  36.0 - 46.0 %   MCV 97.7 80.0 - 100.0 fL   MCH 30.1 26.0 - 34.0 pg   MCHC 30.8 30.0 - 36.0 g/dL   RDW 14.9 11.5 - 15.5 %   Platelets 224 150 - 400 K/uL   nRBC 0.0 0.0 - 0.2 %    Comment: Performed at [facility], [place] [address], [place], [place] [address]   No results found.  ROS: Pain with rom of the left upper extremity  Physical Exam: Alert and oriented 78 y.o. female in no acute distress Cranial nerves 2-12 intact Cervical spine: full rom with no tenderness, nv intact distally Chest: active breath sounds bilaterally, no wheeze rhonchi or rales Heart: regular rate and rhythm, no murmur Abd: non tender non distended with active bowel sounds Hip is stable with rom  Left shoulder painful rom nv intact distally No rashes or edema  Assessment/Plan Assessment: left shoulder painful hardware  Plan:  Patient will undergo a left shoulder poly exchange and hardware removal by Dr. Veverly Fells at [facility]. Risks benefits and expectations were discussed with the patient. Patient understand risks, benefits and expectations and wishes to proceed. Preoperative templating of the joint replacement has been completed, documented, and submitted to the Operating Room personnel in order to optimize intra-operative equipment management.   Merla Riches PA-C, MPAS [facility] is now [employer] [address] [place], [place], [place] [address] Phone: [phone] [email or web address]   [employer]

## 2018-10-08 NOTE — Anesthesia Preprocedure Evaluation (Addendum)
Anesthesia Evaluation  Patient identified by MRN, date of birth, ID band Patient awake    Reviewed: Allergy & Precautions, NPO status , Patient's Chart, lab work & pertinent test results  History of Anesthesia Complications (+) PONV and history of anesthetic complications  Airway Mallampati: II  TM Distance: >3 FB Neck ROM: Full    Dental  (+) Dental Advisory Given   Pulmonary neg pulmonary ROS,    Pulmonary exam normal        Cardiovascular hypertension, Pt. on medications Normal cardiovascular exam     Neuro/Psych  Neuromuscular disease (multiple sclerosis) negative psych ROS   GI/Hepatic Neg liver ROS, GERD  Medicated and Controlled,  Endo/Other  negative endocrine ROS  Renal/GU negative Renal ROS     Musculoskeletal  (+) Arthritis , Osteoarthritis,    Abdominal   Peds  Hematology  (+) anemia ,   Anesthesia Other Findings   Reproductive/Obstetrics                            Anesthesia Physical Anesthesia Plan  ASA: III  Anesthesia Plan: General   Post-op Pain Management:  Regional for Post-op pain   Induction: Intravenous  PONV Risk Score and Plan: 4 or greater and Treatment may vary due to age or medical condition, Ondansetron, Propofol infusion and Dexamethasone  Airway Management Planned: Oral ETT  Additional Equipment: None  Intra-op Plan:   Post-operative Plan: Extubation in OR  Informed Consent: I have reviewed the patients History and Physical, chart, labs and discussed the procedure including the risks, benefits and alternatives for the proposed anesthesia with the patient or authorized representative who has indicated his/her understanding and acceptance.     Dental advisory given  Plan Discussed with: CRNA and Anesthesiologist  Anesthesia Plan Comments:        Anesthesia Quick Evaluation

## 2018-10-11 ENCOUNTER — Encounter (HOSPITAL_COMMUNITY): Payer: Self-pay

## 2018-10-11 ENCOUNTER — Encounter (HOSPITAL_COMMUNITY)
Admission: RE | Disposition: A | Discharge status: Home / Self Care | Payer: Self-pay | Source: Home / Self Care | Attending: Orthopedic Surgery

## 2018-10-11 ENCOUNTER — Other Ambulatory Visit: Payer: Self-pay

## 2018-10-11 ENCOUNTER — Ambulatory Visit (HOSPITAL_COMMUNITY)
Admission: RE | Admit: 2018-10-11 | Discharge: 2018-10-11 | Disposition: A | Discharge status: Home / Self Care | Payer: Medicare Other | Attending: Orthopedic Surgery | Admitting: Orthopedic Surgery

## 2018-10-11 ENCOUNTER — Inpatient Hospital Stay (HOSPITAL_COMMUNITY): Payer: Medicare Other | Admitting: Anesthesiology

## 2018-10-11 ENCOUNTER — Inpatient Hospital Stay (HOSPITAL_COMMUNITY): Payer: Medicare Other | Admitting: Physician Assistant

## 2018-10-11 DIAGNOSIS — T8484XA Pain due to internal orthopedic prosthetic devices, implants and grafts, initial encounter: Secondary | ICD-10-CM | POA: Insufficient documentation

## 2018-10-11 DIAGNOSIS — I10 Essential (primary) hypertension: Secondary | ICD-10-CM | POA: Diagnosis not present

## 2018-10-11 DIAGNOSIS — Z7983 Long term (current) use of bisphosphonates: Secondary | ICD-10-CM | POA: Diagnosis not present

## 2018-10-11 DIAGNOSIS — M069 Rheumatoid arthritis, unspecified: Secondary | ICD-10-CM | POA: Insufficient documentation

## 2018-10-11 DIAGNOSIS — E559 Vitamin D deficiency, unspecified: Secondary | ICD-10-CM | POA: Diagnosis not present

## 2018-10-11 DIAGNOSIS — K219 Gastro-esophageal reflux disease without esophagitis: Secondary | ICD-10-CM | POA: Insufficient documentation

## 2018-10-11 DIAGNOSIS — Z79899 Other long term (current) drug therapy: Secondary | ICD-10-CM | POA: Insufficient documentation

## 2018-10-11 DIAGNOSIS — Z96612 Presence of left artificial shoulder joint: Secondary | ICD-10-CM | POA: Insufficient documentation

## 2018-10-11 DIAGNOSIS — G35 Multiple sclerosis: Secondary | ICD-10-CM | POA: Insufficient documentation

## 2018-10-11 DIAGNOSIS — Z20828 Contact with and (suspected) exposure to other viral communicable diseases: Secondary | ICD-10-CM | POA: Diagnosis not present

## 2018-10-11 DIAGNOSIS — G629 Polyneuropathy, unspecified: Secondary | ICD-10-CM | POA: Diagnosis not present

## 2018-10-11 DIAGNOSIS — Y831 Surgical operation with implant of artificial internal device as the cause of abnormal reaction of the patient, or of later complication, without mention of misadventure at the time of the procedure: Secondary | ICD-10-CM | POA: Diagnosis not present

## 2018-10-11 HISTORY — PX: REVERSE SHOULDER ARTHROPLASTY: SHX5054

## 2018-10-11 SURGERY — ARTHROPLASTY, SHOULDER, TOTAL, REVERSE
Anesthesia: General | Site: Shoulder | Laterality: Left

## 2018-10-11 MED ORDER — TRAMADOL HCL 50 MG PO TABS: 50.0000 mg | ORAL_TABLET | ORAL | 0 refills | Status: AC | PRN

## 2018-10-11 MED ORDER — CEFAZOLIN SODIUM-DEXTROSE 2-4 GM/100ML-% IV SOLN
2.0000 g | INTRAVENOUS | Status: AC
  Administered 2018-10-11: 2 g via INTRAVENOUS
  Filled 2018-10-11: qty 100

## 2018-10-11 MED ORDER — BUPIVACAINE-EPINEPHRINE (PF) 0.25% -1:200000 IJ SOLN
INTRAMUSCULAR | Status: AC
  Filled 2018-10-11: qty 30

## 2018-10-11 MED ORDER — BUPIVACAINE HCL (PF) 0.5 % IJ SOLN
INTRAMUSCULAR | Status: DC | PRN
  Administered 2018-10-11: 15 mL via PERINEURAL

## 2018-10-11 MED ORDER — CHLORHEXIDINE GLUCONATE 4 % EX LIQD: 60.0000 mL | Freq: Once | CUTANEOUS | Status: DC

## 2018-10-11 MED ORDER — PROPOFOL 10 MG/ML IV BOLUS
INTRAVENOUS | Status: AC
  Filled 2018-10-11: qty 20

## 2018-10-11 MED ORDER — SODIUM CHLORIDE 0.9 % IV SOLN
INTRAVENOUS | Status: DC | PRN
  Administered 2018-10-11: 08:00:00 25 ug/min via INTRAVENOUS

## 2018-10-11 MED ORDER — BUPIVACAINE-EPINEPHRINE (PF) 0.25% -1:200000 IJ SOLN
INTRAMUSCULAR | Status: DC | PRN
  Administered 2018-10-11: 20 mL

## 2018-10-11 MED ORDER — ONDANSETRON HCL 4 MG/2ML IJ SOLN: 4.0000 mg | Freq: Once | INTRAMUSCULAR | Status: DC | PRN

## 2018-10-11 MED ORDER — MIDAZOLAM HCL 2 MG/2ML IJ SOLN
INTRAMUSCULAR | Status: AC
  Filled 2018-10-11: qty 2

## 2018-10-11 MED ORDER — PROPOFOL 10 MG/ML IV BOLUS
INTRAVENOUS | Status: DC | PRN
  Administered 2018-10-11: 100 mg via INTRAVENOUS
  Administered 2018-10-11: 10 mg via INTRAVENOUS

## 2018-10-11 MED ORDER — ONDANSETRON HCL 4 MG/2ML IJ SOLN
INTRAMUSCULAR | Status: DC | PRN
  Administered 2018-10-11: 4 mg via INTRAVENOUS

## 2018-10-11 MED ORDER — LIDOCAINE 2% (20 MG/ML) 5 ML SYRINGE
INTRAMUSCULAR | Status: DC | PRN
  Administered 2018-10-11: 40 mg via INTRAVENOUS

## 2018-10-11 MED ORDER — MIDAZOLAM HCL 2 MG/2ML IJ SOLN
INTRAMUSCULAR | Status: DC | PRN
  Administered 2018-10-11 (×4): 0.5 mg via INTRAVENOUS

## 2018-10-11 MED ORDER — FENTANYL CITRATE (PF) 250 MCG/5ML IJ SOLN
INTRAMUSCULAR | Status: AC
  Filled 2018-10-11: qty 5

## 2018-10-11 MED ORDER — FENTANYL CITRATE (PF) 100 MCG/2ML IJ SOLN: 25.0000 ug | INTRAMUSCULAR | Status: DC | PRN

## 2018-10-11 MED ORDER — LACTATED RINGERS IV SOLN
INTRAVENOUS | Status: DC
  Administered 2018-10-11 (×2): via INTRAVENOUS

## 2018-10-11 MED ORDER — 0.9 % SODIUM CHLORIDE (POUR BTL) OPTIME
TOPICAL | Status: DC | PRN
  Administered 2018-10-11: 1000 mL

## 2018-10-11 MED ORDER — BUPIVACAINE LIPOSOME 1.3 % IJ SUSP
INTRAMUSCULAR | Status: DC | PRN
  Administered 2018-10-11: 10 mL via PERINEURAL

## 2018-10-11 MED ORDER — DEXAMETHASONE SODIUM PHOSPHATE 10 MG/ML IJ SOLN
INTRAMUSCULAR | Status: DC | PRN
  Administered 2018-10-11: 10 mg via INTRAVENOUS

## 2018-10-11 MED ORDER — ZOLPIDEM TARTRATE 10 MG PO TABS: 5.0000 mg | ORAL_TABLET | Freq: Every evening | ORAL | 0 refills | Status: DC | PRN

## 2018-10-11 MED ORDER — FENTANYL CITRATE (PF) 250 MCG/5ML IJ SOLN
INTRAMUSCULAR | Status: DC | PRN
  Administered 2018-10-11: 50 ug via INTRAVENOUS
  Administered 2018-10-11 (×4): 25 ug via INTRAVENOUS

## 2018-10-11 MED ORDER — SUCCINYLCHOLINE CHLORIDE 200 MG/10ML IV SOSY
PREFILLED_SYRINGE | INTRAVENOUS | Status: DC | PRN
  Administered 2018-10-11: 100 mg via INTRAVENOUS

## 2018-10-11 MED ORDER — EPHEDRINE SULFATE-NACL 50-0.9 MG/10ML-% IV SOSY
PREFILLED_SYRINGE | INTRAVENOUS | Status: DC | PRN
  Administered 2018-10-11: 10 mg via INTRAVENOUS

## 2018-10-11 SURGICAL SUPPLY — 57 items
AID PSTN UNV HD RSTRNT DISP (MISCELLANEOUS) ×1
BAG SPEC THK2 15X12 ZIP CLS (MISCELLANEOUS)
BAG ZIPLOCK 12X15 (MISCELLANEOUS) ×1 IMPLANT
BIT DRILL 1.6MX128 (BIT) IMPLANT
BLADE SAG 18X100X1.27 (BLADE) ×1 IMPLANT
CONT SPEC 4OZ CLIKSEAL STRL BL (MISCELLANEOUS) ×1 IMPLANT
COVER BACK TABLE 60X90IN (DRAPES) ×2 IMPLANT
COVER SURGICAL LIGHT HANDLE (MISCELLANEOUS) ×2 IMPLANT
COVER WAND RF STERILE (DRAPES) IMPLANT
CUP D38 DXTEND STAND PLUS 6 HU (Orthopedic Implant) ×2 IMPLANT
CUP STD D38 DXTEND PLUS 6 HU (Orthopedic Implant) IMPLANT
DECANTER SPIKE VIAL GLASS SM (MISCELLANEOUS) ×2 IMPLANT
DRAPE INCISE IOBAN 66X45 STRL (DRAPES) ×2 IMPLANT
DRAPE ORTHO SPLIT 77X108 STRL (DRAPES) ×4
DRAPE SHEET LG 3/4 BI-LAMINATE (DRAPES) ×1 IMPLANT
DRAPE SURG ORHT 6 SPLT 77X108 (DRAPES) ×2 IMPLANT
DRAPE U-SHAPE 47X51 STRL (DRAPES) ×2 IMPLANT
DRSG ADAPTIC 3X8 NADH LF (GAUZE/BANDAGES/DRESSINGS) ×2 IMPLANT
DRSG PAD ABDOMINAL 8X10 ST (GAUZE/BANDAGES/DRESSINGS) ×2 IMPLANT
DURAPREP 26ML APPLICATOR (WOUND CARE) ×2 IMPLANT
ELECT BLADE TIP CTD 4 INCH (ELECTRODE) ×2 IMPLANT
ELECT NDL TIP 2.8 STRL (NEEDLE) ×1 IMPLANT
ELECT NEEDLE TIP 2.8 STRL (NEEDLE) ×2 IMPLANT
ELECT REM PT RETURN 15FT ADLT (MISCELLANEOUS) ×2 IMPLANT
GAUZE SPONGE 4X4 12PLY STRL (GAUZE/BANDAGES/DRESSINGS) ×2 IMPLANT
GLOVE BIOGEL PI ORTHO PRO 7.5 (GLOVE) ×1
GLOVE BIOGEL PI ORTHO PRO SZ8 (GLOVE) ×1
GLOVE ORTHO TXT STRL SZ7.5 (GLOVE) ×2 IMPLANT
GLOVE PI ORTHO PRO STRL 7.5 (GLOVE) ×1 IMPLANT
GLOVE PI ORTHO PRO STRL SZ8 (GLOVE) ×1 IMPLANT
GLOVE SURG ORTHO 8.5 STRL (GLOVE) ×2 IMPLANT
GOWN STRL REUS W/TWL XL LVL3 (GOWN DISPOSABLE) ×4 IMPLANT
KIT BASIN OR (CUSTOM PROCEDURE TRAY) ×2 IMPLANT
KIT TURNOVER KIT A (KITS) IMPLANT
MANIFOLD NEPTUNE II (INSTRUMENTS) ×2 IMPLANT
NDL MAYO CATGUT SZ4 TPR NDL (NEEDLE) IMPLANT
NEEDLE MAYO CATGUT SZ4 (NEEDLE) ×2 IMPLANT
NS IRRIG 1000ML POUR BTL (IV SOLUTION) ×2 IMPLANT
PACK SHOULDER (CUSTOM PROCEDURE TRAY) ×2 IMPLANT
PROTECTOR NERVE ULNAR (MISCELLANEOUS) ×2 IMPLANT
RESTRAINT HEAD UNIVERSAL NS (MISCELLANEOUS) ×2 IMPLANT
SLING ARM FOAM STRAP LRG (SOFTGOODS) ×1 IMPLANT
SMARTMIX MINI TOWER (MISCELLANEOUS)
STRIP CLOSURE SKIN 1/2X4 (GAUZE/BANDAGES/DRESSINGS) ×3 IMPLANT
SUCTION FRAZIER HANDLE 10FR (MISCELLANEOUS) ×1
SUCTION TUBE FRAZIER 10FR DISP (MISCELLANEOUS) ×1 IMPLANT
SUT FIBERWIRE #2 38 T-5 BLUE (SUTURE) ×4
SUT MNCRL AB 4-0 PS2 18 (SUTURE) ×3 IMPLANT
SUT VIC AB 0 CT1 36 (SUTURE) ×4 IMPLANT
SUT VIC AB 0 CT2 27 (SUTURE) ×2 IMPLANT
SUT VIC AB 2-0 CT1 27 (SUTURE) ×4
SUT VIC AB 2-0 CT1 TAPERPNT 27 (SUTURE) ×1 IMPLANT
SUTURE FIBERWR #2 38 T-5 BLUE (SUTURE) ×1 IMPLANT
TAPE CLOTH SURG 4X10 WHT LF (GAUZE/BANDAGES/DRESSINGS) ×1 IMPLANT
TOWEL OR 17X26 10 PK STRL BLUE (TOWEL DISPOSABLE) ×2 IMPLANT
TOWER SMARTMIX MINI (MISCELLANEOUS) IMPLANT
YANKAUER SUCT BULB TIP 10FT TU (MISCELLANEOUS) ×2 IMPLANT

## 2018-10-11 NOTE — Transfer of Care (Signed)
Immediate Anesthesia Transfer of Care Note  Patient: Michele Bryan  Procedure(s) Performed: left shoulder irrigation and debridement, open poly exchange and removal of painful hardware (Left Shoulder)  Patient Location: PACU  Anesthesia Type:General  Level of Consciousness: awake and alert   Airway & Oxygen Therapy: Patient Spontanous Breathing and Patient connected to face mask oxygen  Post-op Assessment: Report given to RN and Post -op Vital signs reviewed and stable  Post vital signs: Reviewed and stable  Last Vitals:  Vitals Value Taken Time  BP 130/86 10/11/18 1030  Temp 36.4 C 10/11/18 1030  Pulse 64 10/11/18 1033  Resp 18 10/11/18 1033  SpO2 99 % 10/11/18 1033  Vitals shown include unvalidated device data.  Last Pain:  Vitals:   10/11/18 1030  TempSrc:   PainSc: 0-No pain         Complications: No apparent anesthesia complications

## 2018-10-11 NOTE — Anesthesia Procedure Notes (Signed)
Procedure Name: Intubation Date/Time: 10/11/2018 7:50 AM Performed by: Cynda Familia, CRNA Pre-anesthesia Checklist: Patient identified, Emergency Drugs available, Suction available and Patient being monitored Patient Re-evaluated:Patient Re-evaluated prior to induction Oxygen Delivery Method: Circle System Utilized Preoxygenation: Pre-oxygenation with 100% oxygen Induction Type: IV induction and Rapid sequence Ventilation: Mask ventilation without difficulty Laryngoscope Size: Miller and 2 Grade View: Grade I Tube type: Oral Number of attempts: 1 Airway Equipment and Method: Stylet Placement Confirmation: ETT inserted through vocal cords under direct vision,  positive ETCO2 and breath sounds checked- equal and bilateral Secured at: 21 cm Tube secured with: Tape Dental Injury: Teeth and Oropharynx as per pre-operative assessment  Comments: Smooth IV induction Brock-- intubation AM CRNA atraumatic-- teeth and mouth as preop-- slight chipping right front tooth unchanged with laryngoscopy-- bilat BS

## 2018-10-11 NOTE — Interval H&P Note (Signed)
History and Physical Interval Note:  10/11/2018 7:31 AM  Michele Bryan  has presented today for surgery, with the diagnosis of Left shoulder painful hardware and bone loss.  The various methods of treatment have been discussed with the patient and family. After consideration of risks, benefits and other options for treatment, the patient has consented to  Procedure(s): REVERSE SHOULDER ARTHROPLASTY open poly exchange and removal of painful hardware (Left) as a surgical intervention.  The patient's history has been reviewed, patient examined, no change in status, stable for surgery.  I have reviewed the patient's chart and labs.  Questions were answered to the patient's satisfaction.     [name]

## 2018-10-11 NOTE — Brief Op Note (Signed)
10/11/2018  9:17 AM  PATIENT:  Zettie Cooley  78 y.o. female  PRE-OPERATIVE DIAGNOSIS:  Left shoulder painful scapular hardware and possible bone loss, glenoid per CT scan  POST-OPERATIVE DIAGNOSIS:   same  PROCEDURE:  Procedure(s): left shoulder irrigation and debridement, open poly exchange and removal of painful hardware (Left)  SURGEON:  Surgeon(s) and Role:    Netta Cedars, MD - Primary  PHYSICIAN ASSISTANT:   ASSISTANTS: Ventura Bruns, PA-C   ANESTHESIA:   regional and general  EBL:  50 mL   BLOOD ADMINISTERED:none  DRAINS: none   LOCAL MEDICATIONS USED:  MARCAINE     SPECIMEN:  Source of Specimen:  shoulder tissue and shoulder fluid   DISPOSITION OF SPECIMEN:  PATHOLOGY and Micro for fluid culture  COUNTS:  YES  TOURNIQUET:  * No tourniquets in log *  DICTATION: .Other Dictation: Dictation Number [id number]  PLAN OF CARE: Discharge to home after PACU  PATIENT DISPOSITION:  PACU - hemodynamically stable.   Delay start of Pharmacological VTE agent (>24hrs) due to surgical blood loss or risk of bleeding: not applicable

## 2018-10-11 NOTE — Op Note (Signed)
NAME: Michele Bryan, Michele Bryan MEDICAL RECORD [id number] ACCOUNT [id number] DATE OF BIRTH:[birth year] FACILITY: [facility] LOCATION: [facility] PHYSICIAN:STEVEN Orlena Sheldon, MD  OPERATIVE REPORT  DATE OF PROCEDURE:  10/11/2018  PREOPERATIVE DIAGNOSIS:  Left shoulder pain secondary to painful hardware on the scapula and concern for glenoid bone loss adjacent reverse total shoulder replacement and possible polyethylene wear debris.  POSTOPERATIVE DIAGNOSIS: Left shoulder pain secondary to painful hardware on the scapula and concern for glenoid bone loss adjacent reverse total shoulder replacement and possible polyethylene wear debris.  PROCEDURE PERFORMED:   1.  Left shoulder open hardware removal.  2.  Left shoulder irrigation and debridement with obtaining of deep cultures and tissue specimen followed by polyethylene exchange for reverse shoulder replacement.  ATTENDING SURGEON:  Esmond Plants, MD   ASSISTANT:  Darol Destine, [place], who was scrubbed during the entire procedure and necessary for satisfactory completion of surgery.  ANESTHESIA:  General anesthesia was used plus interscalene block and local.  ESTIMATED BLOOD LOSS:  Less than 50 mL.  FLUID REPLACEMENT:  1500 mL crystalloid.  INSTRUMENT COUNTS:  Correct.  COMPLICATIONS:  No complications.  ANTIBIOTICS:  Perioperative antibiotics were given.  INDICATIONS:  The patient is a 78 year old female with worsening left shoulder pain secondary to suspected painful hardware.  The patient had a scapular spine fracture treated with ORIF with 2 plates and the patient has gone on to heal her fracture has a  prominent plate posteriorly over the scapular spine and wishes the plate to be removed due to persistent pain associated with the plate.  We did discuss this as far as options.  When we did the CT scan to assess the healing of her scapula fracture, it  did show concern for a potential erosion around the posterior aspect of her glenoid  baseplate and thus there was concern for possible polyethylene failure and polyethylene wear debris and osteolysis.  We discussed going in.  In assessing the shoulder for  that potentially a polyethylene exchange and assessing the stability of the implants, the patient agreed with this plan.  Informed consent obtained.  DESCRIPTION OF PROCEDURE:  After an adequate level of anesthesia was achieved, the patient was positioned in the modified beach chair position.  Left shoulder correctly identified and sterilely prepped and draped in the usual manner.  Time-out called,  verifying correct patient, correct site we entered the patient's posterior shoulder over the skin and subcutaneous weakly palpable scapular spine plate.  Dissection down through the subcutaneous tissues using Bovie.  We identified the plate dissected  around that, removed the 4 screws, removed the plate without difficulty.  Rongeur used to remove some fibrous tissue to get everything smooth over the scapular spine and then subcutaneous closure with 2-0 Vicryl and 4-0 Monocryl for skin.  We then  addressed the reverse shoulder replacement with poly exchange and I and D and inspection through the deltopectoral approach.  We utilized the patient's prior incision.  Dissection down through subcutaneous tissues using knife and Bovie.  We identified  the deltopectoral interval developed that using the needle tip Bovie.  We were able to use a Cobb elevator and developed  tissue planes easily.  The patient had a nice pseudocapsule redeveloped so we incised along the anterior border and then up the  rotator interval peeled that back enough to where we could expose the shoulder.  The fluid was clear.  We did go ahead and culture that for aerobic, anaerobic and Gram stain.  We then obtained some intrasynovial  tissue to send for pathology to look for  any polyethylene particles.  At this point, we dislocated the shoulder.  It was difficult to get it  dislocated, thus it was quite stable.  We removed the poly.  There was just central wear there, but nothing like catastrophic failure.  I did place a stem  handle on checked the humeral stem.  It was stable, used a Soil scientist and palpated 360 degrees around the glenosphere and everything was stable and did not have any soft areas and the glenoid baseplate and the glenosphere was stable.  At this point,  we irrigated with a liter of saline.  Again, nothing looking like infection or polyethylene wear debris consequences.  Thus, we selected a real 38+6 to replace the 38+6 that came out; impacted that on the humeral side, reduced the shoulder, it was  stable and then closed the pseudocapsule back including the rotator interval area and that created a nice stable construct.  We irrigated again, closed deltopectoral interval with 0 Vicryl suture followed by 2-0 Vicryl for subcutaneous closure and 4-0  Monocryl for skin.  Steri-Strips applied followed by sterile dressing.  The patient tolerated surgery well.  AN/NUANCE  D:10/11/2018 T:10/11/2018 JOB:[id number]

## 2018-10-11 NOTE — Anesthesia Procedure Notes (Signed)
Date/Time: 10/11/2018 9:31 AM Performed by: Cynda Familia, CRNA Oxygen Delivery Method: Simple face mask Placement Confirmation: positive ETCO2 and breath sounds checked- equal and bilateral Dental Injury: Teeth and Oropharynx as per pre-operative assessment

## 2018-10-11 NOTE — Anesthesia Procedure Notes (Signed)
Procedure Name: MAC Date/Time: 10/11/2018 6:45 AM Performed by: Cynda Familia, CRNA Pre-anesthesia Checklist: Patient identified, Emergency Drugs available, Suction available, Patient being monitored and Timeout performed Patient Re-evaluated:Patient Re-evaluated prior to induction Oxygen Delivery Method: Nasal cannula Placement Confirmation: positive ETCO2 and breath sounds checked- equal and bilateral

## 2018-10-11 NOTE — Anesthesia Postprocedure Evaluation (Signed)
Anesthesia Post Note  Patient: ORABELLE RYLEE  Procedure(s) Performed: left shoulder irrigation and debridement, open poly exchange and removal of painful hardware (Left Shoulder)     Patient location during evaluation: PACU Anesthesia Type: General Level of consciousness: awake and alert Pain management: pain level controlled Vital Signs Assessment: post-procedure vital signs reviewed and stable Respiratory status: spontaneous breathing, nonlabored ventilation and respiratory function stable Cardiovascular status: blood pressure returned to baseline and stable Postop Assessment: no apparent nausea or vomiting Anesthetic complications: no    Last Vitals:  Vitals:   10/11/18 1030 10/11/18 1050  BP: 130/86 139/77  Pulse: 63 65  Resp: 18 16  Temp: (!) 36.4 C 36.4 C  SpO2: 100% 100%    Last Pain:  Vitals:   10/11/18 1050  TempSrc:   PainSc: 0-No pain                 Audry Pili

## 2018-10-11 NOTE — Anesthesia Procedure Notes (Signed)
Anesthesia Regional Block: Interscalene brachial plexus block   Pre-Anesthetic Checklist: ,, timeout performed, Correct Patient, Correct Site, Correct Laterality, Correct Procedure, Correct Position, site marked, Risks and benefits discussed,  Surgical consent,  Pre-op evaluation,  At surgeon's request and post-op pain management  Laterality: Left  Prep: chloraprep       Needles:  Injection technique: Single-shot  Needle Type: Echogenic Needle     Needle Length: 5cm  Needle Gauge: 21     Additional Needles:   Narrative:  Start time: 10/11/2018 7:00 AM End time: 10/11/2018 7:03 AM Injection made incrementally with aspirations every 5 mL.  Performed by: Personally  Anesthesiologist: Audry Pili, MD  Additional Notes: No pain on injection. No increased resistance to injection. Injection made in 5cc increments. Good needle visualization. Patient tolerated the procedure well.

## 2018-10-11 NOTE — Discharge Instructions (Signed)
Ice to the shoulder constantly.  Keep the incision covered and clean and dry for one week, then ok to get it wet in the shower. ° °Do exercise as instructed several times per day. ° °DO NOT reach behind your back or push up out of a chair with the operative arm. ° °Use a sling while you are up and around for comfort, may remove while seated.  Keep pillow propped behind the operative elbow. ° °Follow up with Dr Travis Purk in two weeks in the office, call [phone] for appt °

## 2018-10-12 ENCOUNTER — Encounter (HOSPITAL_COMMUNITY): Payer: Self-pay | Admitting: Orthopedic Surgery

## 2018-10-12 NOTE — Discharge Summary (Signed)
Orthopedic Discharge Summary        Physician Discharge Summary  Patient ID: Michele Bryan MRN: [id number] DOB/AGE: September 13, 1940 78 y.o.  Admit date: 10/11/2018 Discharge date: 10/12/2018   Procedures:  Procedure(s) (LRB): left shoulder irrigation and debridement, open poly exchange and removal of painful hardware (Left)  Attending Physician:  Dr. 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 Anemia   . Arthritis   . GERD (gastroesophageal reflux disease)   . History of blood transfusion   . Hyperlipidemia   . Hypertension   . MS (multiple sclerosis) ([place])   . Neuromuscular disorder ([place])    multiple scleroosis/peripheral neuropathy  . PONV (postoperative nausea and vomiting)   . Rheumatoid arthritis ([place])   . Ulcer   . Vitamin D deficiency     PCP: Kathyrn Lass, MD   Discharged Condition: good  Hospital Course:  Patient underwent the above stated procedure on 10/11/2018. Patient tolerated the procedure well and brought to the recovery room in good condition and subsequently to the floor. Patient had an uncomplicated hospital course and was stable for discharge.   Disposition:  with follow up in 2 weeks   Follow-up Information    Call Netta Cedars, MD.   Specialty: Orthopedic Surgery Why: [phone] Contact information: [address] [place] [address] [place] [address] [phone]           Discharge Instructions    Call MD / Call 911   Complete by: As directed    If you experience chest pain or shortness of breath, CALL 911 and be transported to the hospital emergency room.  If you develope a fever above 101 F, pus (white drainage) or increased drainage or redness at the wound, or calf pain, call your surgeon's office.   Constipation Prevention   Complete by: As directed    Drink plenty of fluids.  Prune juice may be helpful.  You may use a stool softener, such as Colace (over the counter) 100 mg twice a day.  Use MiraLax (over the counter) for constipation as needed.   Diet - low sodium heart healthy   Complete by: As directed    Increase activity slowly as tolerated   Complete by: As directed       Allergies as of 10/11/2018      Reactions   Codeine Nausea And Vomiting   Demerol [meperidine] Nausea And Vomiting      Medication List    TAKE these medications   acetaminophen 650 MG CR tablet Commonly known as: TYLENOL Take 1,300 mg by mouth every 8 (eight) hours as needed for pain.   alendronate 70 MG tablet Commonly known as: FOSAMAX Take 70 mg by mouth every Tuesday. Take with a full glass of water on an empty stomach.   CHELATED IRON PO Take 1 tablet by mouth daily.   Cystex Liqd Take 15 mLs by mouth daily as needed (for UTI).   doxylamine (Sleep) 25 MG tablet Commonly known as: UNISOM Take 25 mg by mouth at bedtime as needed for sleep.   gabapentin 100 MG capsule Commonly known as: NEURONTIN Take 1 capsule (100 mg total) by mouth 3 (three) times daily.   hydroxychloroquine 200 MG tablet Commonly known as: PLAQUENIL Take 200 mg by mouth daily.   Krill Oil 500 MG Caps Take 500 mg  by mouth daily.   losartan 50 MG tablet Commonly known as: COZAAR Take 50 mg by mouth 2 (two) times daily.   methotrexate 2.5 MG tablet TAKE 6 TABLETS BY MOUTH ONCE A WEEK What changed:   how much to take  how to take this  when to take this  additional instructions   multivitamin with minerals Tabs tablet Take 1 tablet by mouth daily.   omeprazole 40 MG capsule Commonly known as: PRILOSEC Take 40 mg by mouth daily.   Orencia 125 MG/ML [place] Generic drug: Abatacept INJECT 125 MG INTO THE SKIN ONCE A WEEK. What changed: See the new instructions.   traMADol 50 MG tablet Commonly known as: Ultram Take 1-2 tablets (50-100 mg total) by  mouth every 4 (four) hours as needed for severe pain. What changed:   how much to take  when to take this   Vitamin B-12 5000 MCG Tbdp Take 5,000 mcg by mouth daily.   Vitamin D 125 MCG (5000 UT) Caps Take 5,000 Units by mouth daily.   vitamin E 400 UNIT capsule Take 400 Units by mouth daily.   zolpidem 10 MG tablet Commonly known as: AMBIEN Take 0.5 tablets (5 mg total) by mouth at bedtime as needed for sleep.         Signed: [name] 10/12/2018, 5:27 PM  [facility] now [facility] [address] [place], [place], [place] [address] Phone: [place]

## 2018-10-14 ENCOUNTER — Ambulatory Visit: Payer: Medicare Other | Admitting: Rheumatology

## 2018-10-16 LAB — AEROBIC/ANAEROBIC CULTURE W GRAM STAIN (SURGICAL/DEEP WOUND): Culture: NO GROWTH

## 2018-10-21 NOTE — Progress Notes (Signed)
Office Visit Note  Patient: Michele Bryan             Date of Birth: 02/27/1941           MRN: [id number]             PCP: Kathyrn Lass, MD Referring: Kathyrn Lass, MD Visit Date: 11/04/2018 Occupation: @GUAROCC @  Subjective:  Left shoulder joint pain   History of Present Illness: Michele Bryan is a 78 y.o. female with history of seropositive rheumatoid arthritis and osteoarthritis.  Patient is on Orencia 125 mg subcutaneous injections once weekly methotrexate 6 tablets by mouth once weekly.  She had a left shoulder replacement debridement and irrigation on 10/11/2018.  Poly-exchange was performed by Dr. Veverly Fells.  She held Orencia and methotrexate for 2 weeks.  She is currently not going to physical therapy.  She will be following up with Dr. Veverly Fells on 11/23/2018.  She has been taking Tylenol as needed for pain relief.  She has tried to avoid taking tramadol.  She continues to have chronic pain in bilateral hands and bilateral feet.  She is taking Fosamax 70 mg by mouth weekly.   Activities of Daily Living:  Patient reports morning stiffness for 2 hours.   Patient Reports nocturnal pain.  Difficulty dressing/grooming: Denies Difficulty climbing stairs: Denies Difficulty getting out of chair: Denies Difficulty using hands for taps, buttons, cutlery, and/or writing: Denies  Review of Systems  Constitutional: Negative for fatigue.  HENT: Negative for mouth sores, mouth dryness and nose dryness.   Eyes: Negative for pain, itching and dryness.  Respiratory: Negative for shortness of breath, wheezing and difficulty breathing.   Cardiovascular: Negative for chest pain and palpitations.  Gastrointestinal: Positive for constipation and nausea. Negative for diarrhea.  Endocrine: Negative for increased urination.  Genitourinary: Negative for difficulty urinating and painful urination.  Musculoskeletal: Positive for arthralgias, joint pain and morning stiffness. Negative for joint swelling.   Skin: Negative for rash and redness.  Allergic/Immunologic: Negative for susceptible to infections.  Neurological: Negative for dizziness, headaches, memory loss and weakness.  Hematological: Negative for swollen glands.  Psychiatric/Behavioral: Negative for confusion. The patient is not nervous/anxious.     PMFS History:  Patient Active Problem List   Diagnosis Date Noted  . Scapula fracture 03/05/2018  . Chronic left shoulder pain 11/25/2017  . Left cervical radiculopathy 09/29/2017  . 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 High risk medication use 05/06/2016  . Primary osteoarthritis of both hands 05/06/2016  . Primary osteoarthritis of left knee 05/06/2016  . Primary osteoarthritis of both feet 05/06/2016  . Dyslipidemia 05/06/2016  . Peripheral neuropathy 05/06/2016  . Diverticulosis of intestine without bleeding 05/06/2016  . Osteopenia  05/06/2016  . Vitamin D deficiency 05/06/2016  . 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 History of GI bleed 05/06/2016  . Cataract of both eyes 05/06/2016  . Urinary urgency 04/30/2015  . Chronic constipation 04/30/2015  . Multiple sclerosis ([place]) 03/23/2013  . Expected blood loss anemia 06/16/2012  . 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 Anemia   . Arthritis   . GERD (gastroesophageal reflux disease)   . History of blood transfusion   . Hyperlipidemia   . Hypertension   . MS (multiple sclerosis) ([place])   . Neuromuscular disorder ([place])    multiple scleroosis/peripheral neuropathy  . PONV (postoperative nausea  and vomiting)   . Rheumatoid arthritis (HCC)   . Ulcer   . Vitamin D deficiency     Family History  Problem Relation Age of Onset  . Heart attack Father   . Breast cancer Neg Hx    Past Surgical History:  Procedure Laterality Date  . AUGMENTATION MAMMAPLASTY    .  breast augumentation    . BREAST SURGERY     biopsy  . COLONOSCOPY    . DILATION AND CURETTAGE OF UTERUS    . EYE SURGERY     both eys  . KNEE ARTHROSCOPY Right   . ORIF SHOULDER FRACTURE Left 03/05/2018   Procedure: OPEN REDUCTION INTERNAL FIXATION (ORIF) LEFT SCAPULA;  Surgeon: Beverely Low, MD;  Location: [facility] OR;  Service: Orthopedics;  Laterality: Left;  . REVERSE SHOULDER ARTHROPLASTY Left 05/15/2017   Procedure: LEFT REVERSE SHOULDER ARTHROPLASTY;  Surgeon: Beverely Low, MD;  Location: [facility] OR;  Service: Orthopedics;  Laterality: Left;  . REVERSE SHOULDER ARTHROPLASTY Left 10/11/2018   Procedure: left shoulder irrigation and debridement, open poly exchange and removal of painful hardware;  Surgeon: Beverely Low, MD;  Location: [facility] ORS;  Service: Orthopedics;  Laterality: Left;  . THYROID LOBECTOMY    . TOTAL KNEE ARTHROPLASTY Right 06/15/2012   Procedure: RIGHT TOTAL KNEE ARTHROPLASTY;  Surgeon: [name], MD;  Location: [facility] ORS;  Service: Orthopedics;  Laterality: Right;  . WRIST SURGERY Left    Social History   Social History Narrative   Patient lives at home with her husband Michele Bryan).   Retired.   Education- college.   Caffeine- Two cups of decaf tea daily.   Right handed.         Immunization History  Administered Date(s) Administered  . Influenza, High Dose Seasonal PF 12/03/2013  . Influenza,inj,quad, With Preservative 11/23/2014  . Influenza-Unspecified 12/19/2017  . Td 12/15/2017  . Zoster Recombinat (Shingrix) 08/10/2017, 01/05/2018     Objective: Vital Signs: BP (!) 156/88 (BP Location: Right Arm, Patient Position: Sitting, Cuff Size: Normal)   Pulse 77   Resp 13   Ht 5\' 3"  (1.6 m)   Wt 134 lb 3.2 oz (60.9 kg)   BMI 23.77 kg/m    Physical Exam Vitals signs and nursing note reviewed.  Constitutional:      Appearance: She is well-developed.  HENT:     Head: Normocephalic and atraumatic.  Eyes:     Conjunctiva/sclera: Conjunctivae normal.  Neck:      Musculoskeletal: Normal range of motion.  Cardiovascular:     Rate and Rhythm: Normal rate and regular rhythm.     Heart sounds: Normal heart sounds.  Pulmonary:     Effort: Pulmonary effort is normal.     Breath sounds: Normal breath sounds.  Abdominal:     General: Bowel sounds are normal.     Palpations: Abdomen is soft.  Lymphadenopathy:     Cervical: No cervical adenopathy.  Skin:    General: Skin is warm and dry.     Capillary Refill: Capillary refill takes less than 2 seconds.  Neurological:     Mental Status: She is alert and oriented to person, place, and time.  Psychiatric:        Behavior: Behavior normal.      Musculoskeletal Exam: C-spine good ROM.  Thoracic kyphosis.  Limited ROM of lumbar spine.  Left shoulder in sling.  Right shoulder has good ROM with no discomfort.  Elbow joints, wrist joints, MCPs, PIPs, and DIPs good ROM with  no synovitis.  PIP and DIP synovial thickening consistent with osteoarthritis of both hands. Hip joints good ROM.  Right knee replacement good ROM with no warmth or effusion.  Left knee has good ROM with no discomfort.  Ankle joints good ROM with no tenderness or inflammation.    CDAI Exam: CDAI Score: 17.2  Patient Global: 6 mm; Provider Global: 6 mm Swollen: 8 ; Tender: 8  Joint Exam      Right  Left  MCP 2  Swollen Tender  Swollen Tender  MCP 3  Swollen Tender  Swollen Tender  MCP 4  Swollen Tender  Swollen Tender  PIP 3  Swollen Tender     PIP 4  Swollen Tender      There is currently no information documented on the homunculus. Go to the Rheumatology activity and complete the homunculus joint exam.  Investigation: No additional findings.  Imaging: No results found.  Recent Labs: Lab Results  Component Value Date   WBC 6.7 10/06/2018   HGB 11.6 (L) 10/06/2018   PLT 224 10/06/2018   NA 138 10/06/2018   K 4.8 10/06/2018   CL 106 10/06/2018   CO2 24 10/06/2018   GLUCOSE 89 10/06/2018   BUN 19 10/06/2018   CREATININE  0.89 10/06/2018   BILITOT 0.7 08/12/2018   ALKPHOS 52 10/09/2016   AST 14 08/12/2018   ALT 10 08/12/2018   PROT 6.2 08/12/2018   ALBUMIN 4.5 10/09/2016   CALCIUM 9.4 10/06/2018   GFRAA >60 10/06/2018   QFTBGOLDPLUS NEGATIVE 10/30/2017    Speciality Comments: PLQ Eye Exam: 07/30/17 WNL @ [facility] Osteoporosis managed by PCP-ACY 10/12/2018  Procedures:  No procedures performed Allergies: Codeine and Demerol [meperidine]       Assessment / Plan:     Visit Diagnoses: 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: She has no active synovitis on exam.  She has chronic pain in bilateral hands and bilateral feet.  She has PIP and DIP synovial thickening consistent with osteoarthritis of bilateral hands.  She has severe synovial thickening of bilateral CMC joints.  She has been performing hand exercises on a regular basis.  She is clinically doing well on Orencia 125 mg subcutaneous injections once weekly and methotrexate 6 tablets by mouth once weekly.  She held methotrexate and Orencia for 2 weeks due to having a left shoulder replacement poly-exchange on 10/11/2018.  She has resumed both medications.  She does not need any refills at this time.  She was advised to notify us if she develops increased joint pain or joint swelling.  She will follow-up in the office in 5 months.  High risk medication use - Orencia 125 mg subq weekly started on 12/08/17,  (Plaquenil was discontinued), methotrexate 6 tablets weekly, and folic acid 1 mg daily.  CBC and BMP were checked on 10/26/2018.  We will check hepatic function panel and TB gold today.  She will return for lab work in November and every 3 months to monitor for drug toxicity.  She was advised to hold Orencia and methotrexate if she develops any signs or symptoms of an infection and to resume once the infection has completely cleared.  We discussed the importance of social distancing and following the  standard precautions recommended by the CDC.- Plan: QuantiFERON-TB Gold Plus, Hepatic function panel  Primary osteoarthritis of both hands: She has severe PIP and DIP synovial thickening consistent with osteoarthritis of bilateral hands.  She has subluxation of several PIP  and DIP joints.  She has incomplete fist formation bilaterally.  She has noticed decreased grip strength.  I discussed the importance of performing hand exercises on a regular basis.  Joint protection and muscle strengthening were discussed.  H/O total shoulder replacement, left: She had a poly-exchange performed on 10/11/2018 by Dr. Ranell PatrickNorris.  She continues to have discomfort and has been taking Tylenol as needed for pain relief.  She has tried to avoid taking tramadol.  She will be following up with Dr. Ranell PatrickNorris on 11/23/2018.  She is unsure if she will be proceeding with physical therapy in the future.  Status post right knee replacement: Doing well.  Good range of motion with no discomfort.  No warmth or effusion noted  Primary osteoarthritis of left knee: She has good range of motion with no discomfort.  No warmth or effusion noted.  She has left knee joint crepitus.  Primary osteoarthritis of both feet: She has osteoarthritic changes in bilateral feet.  She has chronic pain in both feet.  She wears proper fitting shoes.  Osteopenia of multiple sites - She is taking Fosamax 70 mg 1 tablet by mouth once weekly and vitamin D supplements managed by her PCP.  Postural kyphosis of thoracic region: She has no midline spinal tenderness.   Other medical conditions are listed as follows:   History of cataract  History of diverticulosis  Dyslipidemia  History of peripheral neuropathy  History of multiple sclerosis  History of GI bleed  History of vitamin D deficiency  Orders: Orders Placed This Encounter  Procedures  . QuantiFERON-TB Gold Plus  . Hepatic function panel   No orders of the defined types were placed in this  encounter.  .  Follow-Up Instructions: Return in about 5 months (around 04/06/2019) for Rheumatoid arthritis, Osteoarthritis.   Sherron Alesaylor Dale, PA-C  I examined and evaluated the patient with Sherron Alesaylor Dale PA.  Ms. Tyson DenseFenske is having a flare as she had to come off the medications for surgery.  She has resumed medications now.  Her symptoms are gradually improving.  She has synovitis on my examination multiple joints as described above.  We will like to give her some time to see how she recovers gradually.  The plan of care was discussed as noted above.  Pollyann SavoyShaili Mili Piltz, MD  Note - This record has been created using [employer]agon software.  Chart creation errors have been sought, but may not always  have been located. Such creation errors do not reflect on  the standard of medical care.

## 2018-11-04 ENCOUNTER — Ambulatory Visit (INDEPENDENT_AMBULATORY_CARE_PROVIDER_SITE_OTHER): Payer: Medicare Other | Admitting: Rheumatology

## 2018-11-04 ENCOUNTER — Encounter: Payer: Self-pay | Admitting: Rheumatology

## 2018-11-04 ENCOUNTER — Other Ambulatory Visit: Payer: Self-pay

## 2018-11-04 VITALS — BP 156/88 | HR 77 | Resp 13 | Ht 63.0 in | Wt 134.2 lb

## 2018-11-04 DIAGNOSIS — Z79899 Other long term (current) drug therapy: Secondary | ICD-10-CM | POA: Diagnosis not present

## 2018-11-04 DIAGNOSIS — E785 Hyperlipidemia, unspecified: Secondary | ICD-10-CM

## 2018-11-04 DIAGNOSIS — Z8669 Personal history of other diseases of the nervous system and sense organs: Secondary | ICD-10-CM

## 2018-11-04 DIAGNOSIS — M1712 Unilateral primary osteoarthritis, left knee: Secondary | ICD-10-CM

## 2018-11-04 DIAGNOSIS — M0579 Rheumatoid arthritis with rheumatoid factor of multiple sites without organ or systems involvement: Secondary | ICD-10-CM

## 2018-11-04 DIAGNOSIS — Z96612 Presence of left artificial shoulder joint: Secondary | ICD-10-CM

## 2018-11-04 DIAGNOSIS — M19072 Primary osteoarthritis, left ankle and foot: Secondary | ICD-10-CM

## 2018-11-04 DIAGNOSIS — M4004 Postural kyphosis, thoracic region: Secondary | ICD-10-CM

## 2018-11-04 DIAGNOSIS — Z8639 Personal history of other endocrine, nutritional and metabolic disease: Secondary | ICD-10-CM

## 2018-11-04 DIAGNOSIS — M8589 Other specified disorders of bone density and structure, multiple sites: Secondary | ICD-10-CM

## 2018-11-04 DIAGNOSIS — Z96651 Presence of right artificial knee joint: Secondary | ICD-10-CM

## 2018-11-04 DIAGNOSIS — M19041 Primary osteoarthritis, right hand: Secondary | ICD-10-CM | POA: Diagnosis not present

## 2018-11-04 DIAGNOSIS — G35 Multiple sclerosis: Secondary | ICD-10-CM

## 2018-11-04 DIAGNOSIS — Z8719 Personal history of other diseases of the digestive system: Secondary | ICD-10-CM

## 2018-11-04 DIAGNOSIS — M19071 Primary osteoarthritis, right ankle and foot: Secondary | ICD-10-CM

## 2018-11-04 DIAGNOSIS — M19042 Primary osteoarthritis, left hand: Secondary | ICD-10-CM

## 2018-11-04 LAB — HEPATIC FUNCTION PANEL
AG Ratio: 2 (calc) (ref 1.0–2.5)
ALT: 8 U/L (ref 6–29)
AST: 14 U/L (ref 10–35)
Albumin: 4.4 g/dL (ref 3.6–5.1)
Alkaline phosphatase (APISO): 65 U/L (ref 37–153)
Bilirubin, Direct: 0.1 mg/dL (ref 0.0–0.2)
Globulin: 2.2 g/dL (calc) (ref 1.9–3.7)
Indirect Bilirubin: 0.6 mg/dL (calc) (ref 0.2–1.2)
Total Bilirubin: 0.7 mg/dL (ref 0.2–1.2)
Total Protein: 6.6 g/dL (ref 6.1–8.1)

## 2018-11-04 NOTE — Patient Instructions (Addendum)
Standing Labs We placed an order today for your standing lab work.    Please come back and get your standing labs in November and every 3 months   We have open lab daily Monday through Thursday from 8:30-12:30 PM and 1:30-4:30 PM and Friday from 8:30-12:30 PM and 1:30 -4:00 PM at the office of Dr. Shaili Deveshwar.   You may experience shorter wait times on Monday and Friday afternoons. The office is located at [address], [address], [place], [place] [address] No appointment is necessary.   Labs are drawn by [facility].  You may receive a bill from [facility] for your lab work.  If you wish to have your labs drawn at another location, please call the office 24 hours in advance to send orders.  If you have any questions regarding directions or hours of operation,  please call [phone].   Just as a reminder please drink plenty of water prior to coming for your lab work. Thanks 

## 2018-11-05 NOTE — Progress Notes (Signed)
Hepatic function panel is WNL.

## 2018-11-06 LAB — QUANTIFERON-TB GOLD PLUS
Mitogen-NIL: >10 IU/mL
NIL: 0.02 IU/mL
QuantiFERON-TB Gold Plus: NEGATIVE
TB1-NIL: 0 IU/mL
TB2-NIL: 0 IU/mL

## 2018-11-09 NOTE — Progress Notes (Signed)
TB gold negative

## 2018-11-20 ENCOUNTER — Other Ambulatory Visit: Payer: Self-pay | Admitting: Rheumatology

## 2018-11-22 NOTE — Telephone Encounter (Signed)
Last Visit: 11/04/18 Next Visit: 04/06/19 Labs: 10/06/18 Hgb 11.6 RBC 3.86  Okay to refill per Dr. Estanislado Pandy

## 2018-11-30 ENCOUNTER — Telehealth: Payer: Self-pay | Admitting: Rheumatology

## 2018-11-30 NOTE — Telephone Encounter (Signed)
No answer    Left voicemail

## 2018-11-30 NOTE — Telephone Encounter (Signed)
Please advise patient to discontinue methotrexate.  She should hold Orencia until she has been evaluated by her PCP.  If she does not have any infection then she may resume Orencia.

## 2018-11-30 NOTE — Telephone Encounter (Signed)
Returned patient call.  She is concerned she is having a side effect from methotrexate.  She states that she has difficulty taking a deep breath. She recently had shoulder replacement surgery on 8/10 and Orencia/MTX were on hold.  She noticed the symptom started shortly after resuming Orencia and MTX a couple of weeks ago.  Patient states she has to move a certain way to take a deep breathe.  Feels like she can not get enough breath in. Denies sleeping in a different position after surgery. Denies any chest pain, cough, congestion.  Denies seasonal allergies.    Unsure if it is due to MTX/Orencia but recommend patient follow up with PCP to investigate further.  Please advise.   Mariella Saa, PharmD, [place], [place] Clinical Specialty Pharmacist [phone]  11/30/2018 2:57 PM

## 2018-11-30 NOTE — Telephone Encounter (Signed)
Patient called stating she is currently taking Methotrexate and Orencia and is experiencing shortness of breath.  Patient states she is worried that it is a side effect from taking the medications.  Patient requested a return call on her home [phone]

## 2018-12-01 NOTE — Telephone Encounter (Signed)
Called patient to follow up.  She received voicemail.  Advised patient to discontinue methotrexate and hold Orencia until infection is ruled out by PCP.  Patient verbalized understanding.  All questions encouraged and answered.  Instructed patient to call with any other questions or concerns.   Mariella Saa, PharmD, [place], [place] Clinical Specialty Pharmacist [phone]  12/01/2018 1:19 PM

## 2018-12-07 ENCOUNTER — Other Ambulatory Visit: Payer: Self-pay | Admitting: Rheumatology

## 2018-12-07 DIAGNOSIS — M0579 Rheumatoid arthritis with rheumatoid factor of multiple sites without organ or systems involvement: Secondary | ICD-10-CM

## 2018-12-07 NOTE — Telephone Encounter (Signed)
Patient called inquiring again about which medications she should hold.  She has been holding MTX and Orencia. We advised her to follow up with her PCP to rule out active infection and cause of SOB.  She has not followed up with her PCP.  Advised patient to schedule appointment with PCP to rule out active infection and determine cause of SOB.  Advised that she should continue to hold Orencia until infection is ruled out.  Patient verbalized understanding.  She also asked if she should continue Fosamax.  Advised that she can continue Fosamax at this time.  Patient verbalized understanding.  All questions encouraged and answered.  Instructed patient to call with any other questions or concerns.   Michele Bryan, PharmD, [place], [place] Clinical Specialty Pharmacist [phone]  12/07/2018 10:54 AM

## 2018-12-07 NOTE — Addendum Note (Signed)
Addended by: Mariella Saa C on: 12/07/2018 10:55 AM   Modules accepted: Orders

## 2018-12-07 NOTE — Telephone Encounter (Signed)
Last Visit: 11/04/18 Next Visit: 04/06/19 Labs: 10/06/18 Hgb 11.6 RBC 3.86 TB Gold: 11/04/18 Neg   Okay to refill per Dr. Estanislado Pandy

## 2018-12-09 MED FILL — ORENCIA 125 MG/ML SYRINGE: 125 | 84 days supply | Qty: 12 | Fill #0

## 2019-02-14 ENCOUNTER — Telehealth: Payer: Self-pay | Admitting: Pharmacy Technician

## 2019-02-14 NOTE — Telephone Encounter (Signed)
Submitted a Prior Authorization request to [facility] for [facility] via Cover My Meds. Will update once we receive a response.   2:38 PM Beatriz Chancellor, CPhT

## 2019-02-14 NOTE — Telephone Encounter (Signed)
Received notification from [facility] regarding a prior authorization for [facility]. Authorization has been APPROVED from 02/14/19 to 03/02/20.   Will send document to scan center.  Authorization # [id number] Phone # [phone]

## 2019-02-28 ENCOUNTER — Telehealth: Payer: Self-pay | Admitting: Rheumatology

## 2019-02-28 NOTE — Telephone Encounter (Signed)
Patient called stating her insurance is changing to [facility] and received an email regarding prior authorization needed for [place].  Patient is requesting a return call.

## 2019-03-01 NOTE — Telephone Encounter (Signed)
Returned patient's call, advised we will need to wait until the new [employer] plan goes into effect after 03/04/19. We will initiate new Orencia Prior Auth on 03/07/19. Patient has 6 doses on hand due to holding for surgery.   [employer] ID# [id number]  9:55 AM Beatriz Chancellor, CPhT

## 2019-03-07 NOTE — Telephone Encounter (Signed)
Submitted a Prior Authorization request to [employer] for [facility] via [employer]. Will update once we receive a response.  Ref# [id number] Phone# [phone]  9:51 AM Dorthula Nettles, CPhT

## 2019-03-10 NOTE — Telephone Encounter (Signed)
Received notification from [facility] regarding a prior authorization for [facility]. Authorization has been APPROVED from 03/07/19 to 03/02/20.   Will send document to scan center.  Authorization # [id number] Phone # [phone]  8:50 AM Dorthula Nettles, CPhT

## 2019-03-23 ENCOUNTER — Other Ambulatory Visit: Payer: Self-pay | Admitting: Orthopedic Surgery

## 2019-03-23 ENCOUNTER — Other Ambulatory Visit: Payer: Self-pay | Admitting: Chiropractor

## 2019-03-23 ENCOUNTER — Ambulatory Visit
Admission: RE | Admit: 2019-03-23 | Discharge: 2019-03-23 | Disposition: A | Discharge status: Home / Self Care | Payer: Medicare PPO | Source: Ambulatory Visit | Attending: Chiropractor | Admitting: Chiropractor

## 2019-03-23 ENCOUNTER — Other Ambulatory Visit: Payer: Self-pay

## 2019-03-23 ENCOUNTER — Other Ambulatory Visit (HOSPITAL_COMMUNITY): Payer: Self-pay | Admitting: Orthopedic Surgery

## 2019-03-23 DIAGNOSIS — M549 Dorsalgia, unspecified: Secondary | ICD-10-CM

## 2019-03-23 DIAGNOSIS — M25519 Pain in unspecified shoulder: Secondary | ICD-10-CM

## 2019-03-23 DIAGNOSIS — Z4789 Encounter for other orthopedic aftercare: Secondary | ICD-10-CM

## 2019-03-30 ENCOUNTER — Encounter (HOSPITAL_COMMUNITY)
Admission: RE | Admit: 2019-03-30 | Discharge: 2019-03-30 | Disposition: A | Discharge status: Home / Self Care | Payer: Medicare PPO | Source: Ambulatory Visit | Attending: Orthopedic Surgery | Admitting: Orthopedic Surgery

## 2019-03-30 ENCOUNTER — Other Ambulatory Visit: Payer: Self-pay

## 2019-03-30 DIAGNOSIS — Z4789 Encounter for other orthopedic aftercare: Secondary | ICD-10-CM | POA: Insufficient documentation

## 2019-03-30 MED ORDER — TECHNETIUM TC 99M MEDRONATE IV KIT
20.0000 | PACK | Freq: Once | INTRAVENOUS | Status: AC | PRN
  Administered 2019-03-30: 20 via INTRAVENOUS

## 2019-03-30 NOTE — Progress Notes (Signed)
Virtual Visit via Video Note  I connected with Michele Bryan on 04/06/19 at 10:15 AM EST by a video enabled telemedicine application and verified that I am speaking with the correct person using two identifiers.  Location: Patient: Home Provider: Clinic  This service was conducted via virtual visit.  Both audio and visual tools were used.  The patient was located at home. I was located in my office.  Consent was obtained prior to the virtual visit and is aware of possible charges through their insurance for this visit.  The patient is an established patient.  [name], MD conducted the virtual visit and Sherron Ales, PA-C acted as scribe during the service.  Office staff helped with scheduling follow up visits after the service was conducted.   I discussed the limitations of evaluation and management by telemedicine and the availability of in person appointments. The patient expressed understanding and agreed to proceed.  CC: Left shoulder joint pain  History of Present Illness: Patient is a 79 year old female with a past medical history of seropositive rheumatoid arthritis and osteoarthritis. She is on Orencia 125 mg sq injections once weekly. She denies any recent rheumatoid arthritis flares.  She has chronic pain in multiple joints but denies any joint inflammation. She has persistent pain in the left shoulder which is replaced.  She continues to follow up closely with Dr. Ranell Patrick.  She has chronic pain and stiffness in both hands. She has difficulty making a complete fist.  She states her right knee replacement is doing well.   She continues to take fosamax 70 mg once weekly for management of osteoporosis.    She rates her RA a 5/10.   Review of Systems  Constitutional: Positive for malaise/fatigue. Negative for fever.  HENT: Negative for congestion.   Eyes: Negative for photophobia, pain, discharge and redness.  Respiratory: Negative for cough, shortness of breath and wheezing.    Cardiovascular: Negative for chest pain, palpitations and leg swelling.  Gastrointestinal: Positive for constipation and diarrhea. Negative for blood in stool.  Genitourinary: Negative for dysuria.  Musculoskeletal: Positive for back pain and joint pain. Negative for myalgias and neck pain.  Skin: Negative for rash.  Neurological: Negative for dizziness, weakness and headaches.  Endo/Heme/Allergies: Does not bruise/bleed easily.  Psychiatric/Behavioral: Negative for depression and memory loss. The patient is not nervous/anxious and does not have insomnia.       Observations/Objective: Physical Exam  Constitutional: She is oriented to person, place, and time and well-developed, well-nourished, and in no distress.  HENT:  Head: Normocephalic and atraumatic.  Eyes: Conjunctivae are normal.  Pulmonary/Chest: Effort normal.  Neurological: She is alert and oriented to person, place, and time.  Psychiatric: Mood, memory, affect and judgment normal.   Patient reports joint stiffness all day Patient reports nocturnal pain.  Difficulty dressing/grooming: Denies Difficulty climbing stairs: Denies Difficulty getting out of chair: Denies Difficulty using hands for taps, buttons, cutlery, and/or writing: Denies   Assessment and Plan: Visit Diagnoses: 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: She has not had any recent rheumatoid arthritis flares.  She is clinically doing well on Orencia 125 mg sq injections once weekly. She discontinued MTX in September 2020 due to increased shortness of breath. She has chronic pain and stiffness in the left shoulder and both hands.  She is not having any joint inflammation at this time.  She will continue on Orencia 125 mg sq injections once weekly as monotherapy. She will  continue to take tramadol as needed for pain relief. She was advised to notify us if she develops increased joint pain or joint swelling.  She  will follow up in 4 months.   High risk medication use - Orencia 125 mg subq injection once weekly started on 12/08/17.  (Plaquenil was discontinued).  She discontinued Methotrexate due to experiencing increased SOB. She was encouraged to receive the Covid-19 vaccination as soon as she can. CBC and CMP were WNL on 04/04/19.  She is due to update lab work in May and every 3 months. TB gold negative on 11/04/18.  She has a history of MS so she is not a candidate for Anti-TNFs.   Primary osteoarthritis of both hands: She has chronic pain and stiffness in both hands.  She has difficulty making a complete fist due to the discomfort and joint stiffness.  Joint protection and muscle strengthening were discussed.   H/O total shoulder replacement, left: She had a poly-exchange performed on 10/11/2018 by Dr. Veverly Fells. She has persistent pain and delayed healing according to the patient.  She is following up with Dr. Veverly Fells on a regular basis.  Status post right knee replacement: Doing well.  she has no discomfort at this time.  She has no difficulty climbing steps or getting up from a chair.  Primary osteoarthritis of left knee: She has no discomfort at this time.   Primary osteoarthritis of both feet:She has no feet pain or inflammation at this time.   Osteopenia of multiple sites - She is taking Fosamax 70 mg 1 tablet by mouth once weekly and vitamin D supplements managed by her PCP.  History of vitamin D deficiency: She takes vitamin D 5,000 units daily.   Postural kyphosis of thoracic region: She has no discomfort at this time.    Other medical conditions are listed as follows:   History of cataract  History of diverticulosis  Dyslipidemia  History of peripheral neuropathy  History of multiple sclerosis  History of GI bleed    Follow Up Instructions: She will follow up in 4 months    I discussed the assessment and treatment plan with the patient. The patient was provided an  opportunity to ask questions and all were answered. The patient agreed with the plan and demonstrated an understanding of the instructions.   The patient was advised to call back or seek an in-person evaluation if the symptoms worsen or if the condition fails to improve as anticipated.  I provided 20 minutes of non-face-to-face time during this encounter.   Bo Merino, MD   Scribed byLovena Le Dale,PA-C

## 2019-04-01 ENCOUNTER — Other Ambulatory Visit: Payer: Self-pay | Admitting: *Deleted

## 2019-04-01 ENCOUNTER — Other Ambulatory Visit: Payer: Self-pay | Admitting: Rheumatology

## 2019-04-01 DIAGNOSIS — M0579 Rheumatoid arthritis with rheumatoid factor of multiple sites without organ or systems involvement: Secondary | ICD-10-CM

## 2019-04-01 DIAGNOSIS — Z79899 Other long term (current) drug therapy: Secondary | ICD-10-CM

## 2019-04-01 NOTE — Telephone Encounter (Signed)
Last Visit: 11/04/18 Next Visit: 04/06/19 Labs: 10/06/18 Hgb 11.6 RBC 3.86 TB Gold: 11/04/18 Neg   Patient advised she is due to update labs. Patient states she will update 04/04/19   Okay to refill 30 day supply Orencia?

## 2019-04-04 LAB — CBC WITH DIFFERENTIAL/PLATELET
Absolute Monocytes: 533 cells/uL (ref 200–950)
Basophils Absolute: 50 cells/uL (ref 0–200)
Basophils Relative: 0.8 %
Eosinophils Absolute: 62 cells/uL (ref 15–500)
Eosinophils Relative: 1 %
HCT: 36.3 % (ref 35.0–45.0)
Hemoglobin: 11.9 g/dL (ref 11.7–15.5)
Lymphs Abs: 1990 cells/uL (ref 850–3900)
MCH: 30.2 pg (ref 27.0–33.0)
MCHC: 32.8 g/dL (ref 32.0–36.0)
MCV: 92.1 fL (ref 80.0–100.0)
MPV: 10.1 fL (ref 7.5–12.5)
Monocytes Relative: 8.6 %
Neutro Abs: 3565 cells/uL (ref 1500–7800)
Neutrophils Relative %: 57.5 %
Platelets: 255 10*3/uL (ref 140–400)
RBC: 3.94 10*6/uL (ref 3.80–5.10)
RDW: 13 % (ref 11.0–15.0)
Total Lymphocyte: 32.1 %
WBC: 6.2 10*3/uL (ref 3.8–10.8)

## 2019-04-04 LAB — COMPLETE METABOLIC PANEL WITH GFR
AG Ratio: 2 (calc) (ref 1.0–2.5)
ALT: 9 U/L (ref 6–29)
AST: 11 U/L (ref 10–35)
Albumin: 4.5 g/dL (ref 3.6–5.1)
Alkaline phosphatase (APISO): 72 U/L (ref 37–153)
BUN: 17 mg/dL (ref 7–25)
CO2: 27 mmol/L (ref 20–32)
Calcium: 9.6 mg/dL (ref 8.6–10.4)
Chloride: 104 mmol/L (ref 98–110)
Creat: 0.77 mg/dL (ref 0.60–0.93)
GFR, Est African American: 87 mL/min/{1.73_m2} (ref >=60)
GFR, Est Non African American: 75 mL/min/{1.73_m2} (ref >=60)
Globulin: 2.3 g/dL (calc) (ref 1.9–3.7)
Glucose, Bld: 85 mg/dL (ref 65–99)
Potassium: 4.2 mmol/L (ref 3.5–5.3)
Sodium: 139 mmol/L (ref 135–146)
Total Bilirubin: 0.7 mg/dL (ref 0.2–1.2)
Total Protein: 6.8 g/dL (ref 6.1–8.1)

## 2019-04-04 NOTE — Telephone Encounter (Signed)
ok 

## 2019-04-06 ENCOUNTER — Telehealth (INDEPENDENT_AMBULATORY_CARE_PROVIDER_SITE_OTHER): Payer: Medicare PPO | Admitting: Rheumatology

## 2019-04-06 ENCOUNTER — Encounter: Payer: Self-pay | Admitting: Rheumatology

## 2019-04-06 ENCOUNTER — Other Ambulatory Visit: Payer: Self-pay

## 2019-04-06 DIAGNOSIS — Z8669 Personal history of other diseases of the nervous system and sense organs: Secondary | ICD-10-CM

## 2019-04-06 DIAGNOSIS — M8589 Other specified disorders of bone density and structure, multiple sites: Secondary | ICD-10-CM

## 2019-04-06 DIAGNOSIS — G35 Multiple sclerosis: Secondary | ICD-10-CM

## 2019-04-06 DIAGNOSIS — M19042 Primary osteoarthritis, left hand: Secondary | ICD-10-CM

## 2019-04-06 DIAGNOSIS — Z8719 Personal history of other diseases of the digestive system: Secondary | ICD-10-CM

## 2019-04-06 DIAGNOSIS — M19041 Primary osteoarthritis, right hand: Secondary | ICD-10-CM

## 2019-04-06 DIAGNOSIS — Z79899 Other long term (current) drug therapy: Secondary | ICD-10-CM | POA: Diagnosis not present

## 2019-04-06 DIAGNOSIS — M0579 Rheumatoid arthritis with rheumatoid factor of multiple sites without organ or systems involvement: Secondary | ICD-10-CM

## 2019-04-06 DIAGNOSIS — Z96651 Presence of right artificial knee joint: Secondary | ICD-10-CM

## 2019-04-06 DIAGNOSIS — M19071 Primary osteoarthritis, right ankle and foot: Secondary | ICD-10-CM

## 2019-04-06 DIAGNOSIS — Z8639 Personal history of other endocrine, nutritional and metabolic disease: Secondary | ICD-10-CM

## 2019-04-06 DIAGNOSIS — M1712 Unilateral primary osteoarthritis, left knee: Secondary | ICD-10-CM

## 2019-04-06 DIAGNOSIS — E785 Hyperlipidemia, unspecified: Secondary | ICD-10-CM

## 2019-04-06 DIAGNOSIS — Z96612 Presence of left artificial shoulder joint: Secondary | ICD-10-CM | POA: Diagnosis not present

## 2019-04-06 DIAGNOSIS — M19072 Primary osteoarthritis, left ankle and foot: Secondary | ICD-10-CM

## 2019-04-06 DIAGNOSIS — M4004 Postural kyphosis, thoracic region: Secondary | ICD-10-CM

## 2019-04-07 MED FILL — ORENCIA 125 MG/ML SYRINGE: 125 | 28 days supply | Qty: 4 | Fill #0

## 2019-04-08 ENCOUNTER — Telehealth: Payer: Self-pay | Admitting: Rheumatology

## 2019-04-08 NOTE — Telephone Encounter (Signed)
Spoke with patient and she has received her 30 day supply of Orencia. Patient advised with next refill we will send 90 day supply.

## 2019-04-08 NOTE — Telephone Encounter (Signed)
Patient left a voicemail requesting a return call regarding her Orencia medication.  Patient would like to receive a 3 month supply instead of 1 month supply.

## 2019-04-15 ENCOUNTER — Other Ambulatory Visit (HOSPITAL_BASED_OUTPATIENT_CLINIC_OR_DEPARTMENT_OTHER): Payer: Self-pay | Admitting: Family Medicine

## 2019-04-15 ENCOUNTER — Ambulatory Visit (HOSPITAL_BASED_OUTPATIENT_CLINIC_OR_DEPARTMENT_OTHER)
Admission: RE | Admit: 2019-04-15 | Discharge: 2019-04-15 | Disposition: A | Discharge status: Home / Self Care | Payer: Medicare PPO | Source: Ambulatory Visit | Attending: Family Medicine | Admitting: Family Medicine

## 2019-04-15 ENCOUNTER — Other Ambulatory Visit: Payer: Self-pay

## 2019-04-15 ENCOUNTER — Encounter (HOSPITAL_BASED_OUTPATIENT_CLINIC_OR_DEPARTMENT_OTHER): Payer: Self-pay

## 2019-04-15 DIAGNOSIS — R1032 Left lower quadrant pain: Secondary | ICD-10-CM | POA: Insufficient documentation

## 2019-04-15 MED ORDER — IOHEXOL 300 MG/ML  SOLN
100.0000 mL | Freq: Once | INTRAMUSCULAR | Status: AC | PRN
  Administered 2019-04-15: 100 mL via INTRAVENOUS

## 2019-05-02 ENCOUNTER — Other Ambulatory Visit: Payer: Self-pay | Admitting: Rheumatology

## 2019-05-02 NOTE — Telephone Encounter (Signed)
Last Visit: 04/06/19 Next Visit: 08/04/19 Labs: 04/04/19 WNL TB Gold: 11/04/19 Neg  Okay to refill per [name]

## 2019-05-05 DIAGNOSIS — M4180 Other forms of scoliosis, site unspecified: Secondary | ICD-10-CM | POA: Diagnosis not present

## 2019-05-05 DIAGNOSIS — M7918 Myalgia, other site: Secondary | ICD-10-CM | POA: Diagnosis not present

## 2019-05-05 DIAGNOSIS — M48062 Spinal stenosis, lumbar region with neurogenic claudication: Secondary | ICD-10-CM | POA: Diagnosis not present

## 2019-05-11 MED FILL — ORENCIA 125 MG/ML SYRINGE: 125 | 28 days supply | Qty: 4 | Fill #0

## 2019-05-19 DIAGNOSIS — S42122D Displaced fracture of acromial process, left shoulder, subsequent encounter for fracture with routine healing: Secondary | ICD-10-CM | POA: Diagnosis not present

## 2019-05-19 DIAGNOSIS — Z96612 Presence of left artificial shoulder joint: Secondary | ICD-10-CM | POA: Diagnosis not present

## 2019-05-31 DIAGNOSIS — R1032 Left lower quadrant pain: Secondary | ICD-10-CM | POA: Diagnosis not present

## 2019-05-31 DIAGNOSIS — K529 Noninfective gastroenteritis and colitis, unspecified: Secondary | ICD-10-CM | POA: Diagnosis not present

## 2019-06-07 DIAGNOSIS — R197 Diarrhea, unspecified: Secondary | ICD-10-CM | POA: Diagnosis not present

## 2019-06-07 DIAGNOSIS — R1032 Left lower quadrant pain: Secondary | ICD-10-CM | POA: Diagnosis not present

## 2019-06-07 DIAGNOSIS — D649 Anemia, unspecified: Secondary | ICD-10-CM | POA: Diagnosis not present

## 2019-06-07 DIAGNOSIS — R935 Abnormal findings on diagnostic imaging of other abdominal regions, including retroperitoneum: Secondary | ICD-10-CM | POA: Diagnosis not present

## 2019-06-07 DIAGNOSIS — K5900 Constipation, unspecified: Secondary | ICD-10-CM | POA: Diagnosis not present

## 2019-06-21 DIAGNOSIS — Z4789 Encounter for other orthopedic aftercare: Secondary | ICD-10-CM | POA: Diagnosis not present

## 2019-06-21 DIAGNOSIS — M25512 Pain in left shoulder: Secondary | ICD-10-CM | POA: Diagnosis not present

## 2019-06-22 DIAGNOSIS — M25551 Pain in right hip: Secondary | ICD-10-CM | POA: Diagnosis not present

## 2019-06-22 DIAGNOSIS — M7061 Trochanteric bursitis, right hip: Secondary | ICD-10-CM | POA: Diagnosis not present

## 2019-06-22 DIAGNOSIS — M4180 Other forms of scoliosis, site unspecified: Secondary | ICD-10-CM | POA: Diagnosis not present

## 2019-06-28 MED FILL — ORENCIA 125 MG/ML SYRINGE: 125 | 28 days supply | Qty: 4 | Fill #1

## 2019-07-22 NOTE — Progress Notes (Signed)
Office Visit Note  Patient: Michele Bryan             Date of Birth: [birth year]           MRN: [id number]             PCP: Sigmund Hazel, MD Referring: Sigmund Hazel, MD Visit Date: 08/04/2019 Occupation: @GUAROCC @  Subjective:  Left shoulder pain   History of Present Illness: Michele Bryan is a 79 y.o. female with history of seropositive rheumatoid arthritis and osteoarthritis.  Patient is on Orencia 125 mg subcutaneous injections once weekly.  She has not missed any doses of Orencia recently and has not had any injection site reactions.  She denies any recent infections.  She has received both COVID-19 vaccinations.  Patient reports she continues to have chronic pain in the left shoulder which is replaced, both hands, and her lower back. She denies any increased joint swelling but she has had severe pain in her left shoulder and lower back. She states that her right knee replacement is doing well.  She has been using a cane to assist with ambulation.  She is very nervous that she will have a fall due to feeling unstable on her feet at times.  She continues to take tramadol and Tylenol as needed for pain relief.  She is also on Fosamax 70 mg 1 tablet by mouth once weekly and a vitamin D supplement daily for management of osteopenia.    Activities of Daily Living:  Patient reports morning stiffness for 10 minutes.   Patient Denies nocturnal pain.  Difficulty dressing/grooming: Denies Difficulty climbing stairs: Reports Difficulty getting out of chair: Reports Difficulty using hands for taps, buttons, cutlery, and/or writing: Denies  Review of Systems  Constitutional: Positive for fatigue.  HENT: Positive for mouth dryness. Negative for mouth sores and nose dryness.   Eyes: Negative for pain, visual disturbance and dryness.  Respiratory: Negative for cough, hemoptysis, shortness of breath and difficulty breathing.   Cardiovascular: Negative for chest pain, palpitations, hypertension and  swelling in legs/feet.  Gastrointestinal: Positive for constipation. Negative for blood in stool and diarrhea.  Endocrine: Negative for increased urination.  Genitourinary: Negative for painful urination.  Musculoskeletal: Positive for arthralgias, joint pain, joint swelling and morning stiffness. Negative for myalgias, muscle weakness, muscle tenderness and myalgias.  Skin: Negative for color change, pallor, rash, hair loss, nodules/bumps, skin tightness, ulcers and sensitivity to sunlight.  Allergic/Immunologic: Negative for susceptible to infections.  Neurological: Negative for dizziness, numbness, headaches and weakness.  Hematological: Negative for swollen glands.  Psychiatric/Behavioral: Positive for sleep disturbance. Negative for depressed mood. The patient is not nervous/anxious.     PMFS History:  Patient Active Problem List   Diagnosis Date Noted  . Scapula fracture 03/05/2018  . Chronic left shoulder pain 11/25/2017  . Left cervical radiculopathy 09/29/2017  . 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 High risk medication use 05/06/2016  . Primary osteoarthritis of both hands 05/06/2016  . Primary osteoarthritis of left knee 05/06/2016  . Primary osteoarthritis of both feet 05/06/2016  . Dyslipidemia 05/06/2016  . Peripheral neuropathy 05/06/2016  . Diverticulosis of intestine without bleeding 05/06/2016  . Osteopenia  05/06/2016  . Vitamin D deficiency 05/06/2016  . 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 History of GI bleed 05/06/2016  . Cataract of both eyes 05/06/2016  . Urinary urgency 04/30/2015  . Chronic constipation  04/30/2015  . Multiple sclerosis ([place]) 03/23/2013  . Expected blood loss anemia 06/16/2012  . 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 Anemia   . Arthritis   . GERD (gastroesophageal reflux  disease)   . History of blood transfusion   . Hyperlipidemia   . Hypertension   . MS (multiple sclerosis) ([place])   . Neuromuscular disorder ([place])    multiple scleroosis/peripheral neuropathy  . PONV (postoperative nausea and vomiting)   . Rheumatoid arthritis ([place])   . Ulcer   . Vitamin D deficiency     Family History  Problem Relation Age of Onset  . Heart attack Father   . Breast cancer Neg Hx    Past Surgical History:  Procedure Laterality Date  . AUGMENTATION MAMMAPLASTY    . breast augumentation    . BREAST SURGERY     biopsy  . COLONOSCOPY    . DILATION AND CURETTAGE OF UTERUS    . EYE SURGERY     both eys  . KNEE ARTHROSCOPY Right   . ORIF SHOULDER FRACTURE Left 03/05/2018   Procedure: OPEN REDUCTION INTERNAL FIXATION (ORIF) LEFT SCAPULA;  Surgeon: Netta Cedars, MD;  Location: [facility];  Service: Orthopedics;  Laterality: Left;  . REVERSE SHOULDER ARTHROPLASTY Left 05/15/2017   Procedure: LEFT REVERSE SHOULDER ARTHROPLASTY;  Surgeon: Netta Cedars, MD;  Location: [facility];  Service: Orthopedics;  Laterality: Left;  . REVERSE SHOULDER ARTHROPLASTY Left 10/11/2018   Procedure: left shoulder irrigation and debridement, open poly exchange and removal of painful hardware;  Surgeon: Netta Cedars, MD;  Location: [facility];  Service: Orthopedics;  Laterality: Left;  . THYROID LOBECTOMY    . TOTAL KNEE ARTHROPLASTY Right 06/15/2012   Procedure: RIGHT TOTAL KNEE ARTHROPLASTY;  Surgeon: Mauri Pole, MD;  Location: [facility];  Service: Orthopedics;  Laterality: Right;  . WRIST SURGERY Left    Social History   Social History Narrative   Patient lives at home with her husband [name]).   Retired.   Education- college.   Caffeine- Two cups of decaf tea daily.   Right handed.         Immunization History  Administered Date(s) Administered  . Influenza, High Dose Seasonal PF 12/03/2013  . Influenza,inj,quad, With Preservative 11/23/2014  . Influenza-Unspecified 12/19/2017  . Td  12/15/2017  . Zoster Recombinat (Shingrix) 08/10/2017, 01/05/2018     Objective: Vital Signs: BP 111/70 (BP Location: Left Arm, Patient Position: Sitting, Cuff Size: Normal)   Pulse 69   Resp 12   Ht 5\' 3"  (1.6 m)   Wt 116 lb 12.8 oz (53 kg)   BMI 20.69 kg/m    Physical Exam Vitals and nursing note reviewed.  Constitutional:      Appearance: She is well-developed.  HENT:     Head: Normocephalic and atraumatic.  Eyes:     Conjunctiva/sclera: Conjunctivae normal.  Pulmonary:     Effort: Pulmonary effort is normal.  Abdominal:     General: Bowel sounds are normal.     Palpations: Abdomen is soft.  Musculoskeletal:     Cervical back: Normal range of motion.  Lymphadenopathy:     Cervical: No cervical adenopathy.  Skin:    General: Skin is warm and dry.     Capillary Refill: Capillary refill takes less than 2 seconds.  Neurological:     Mental Status: She is alert and oriented to person, place, and time.  Psychiatric:  Behavior: Behavior normal.      Musculoskeletal Exam: C-spine limited and painful ROM.  Thoracic kyphosis noted.  Thoracolumbar scoliosis apparent. Right shoulder abduction to about 120 degrees.  Left shoulder replacement painful and limited abduction and internal rotation.  Tenderness of the left shoulder and left trapezius muscle.  Elbow joints and wrist joints good ROM with no discomfort.  CMC, PIP, and DIP thickening consistent with osteoarthritis of both hands.  Subluxation of bilateral 1st MCP joints.  Ulnar deviation bilaterally.  Tenderness and inflammation in the right 3rd and 4th PIP joints.  Hip joints good ROM with no discomfort.  Right knee replacement good ROM with no discomfort.  Left knee has good ROM with on warmth or effusion.  Ankle joints good ROM with no tenderness or inflammation.  No tenderness of MTP joints.  CDAI Exam: CDAI Score: 6  Patient Global: 5 mm; Provider Global: 5 mm Swollen: 2 ; Tender: 3  Joint Exam 08/04/2019       Right  Left  Glenohumeral      Tender  PIP 3  Swollen Tender     PIP 4  Swollen Tender        Investigation: No additional findings.  Imaging: No results found.  Recent Labs: Lab Results  Component Value Date   WBC 6.2 04/04/2019   HGB 11.9 04/04/2019   PLT 255 04/04/2019   NA 139 04/04/2019   K 4.2 04/04/2019   CL 104 04/04/2019   CO2 27 04/04/2019   GLUCOSE 85 04/04/2019   BUN 17 04/04/2019   CREATININE 0.77 04/04/2019   BILITOT 0.7 04/04/2019   ALKPHOS 52 10/09/2016   AST 11 04/04/2019   ALT 9 04/04/2019   PROT 6.8 04/04/2019   ALBUMIN 4.5 10/09/2016   CALCIUM 9.6 04/04/2019   GFRAA 87 04/04/2019   QFTBGOLDPLUS NEGATIVE 11/04/2018    Speciality Comments: PLQ Eye Exam: 07/30/17 WNL @ [facility] Osteoporosis managed by PCP-[facility] 10/12/2018  Procedures:  No procedures performed Allergies: Codeine and Demerol [meperidine]   Assessment / Plan:     Visit Diagnoses: 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: She has tenderness and mild inflammation in the right third and fourth PIP joints.  She has incomplete fist formation bilaterally.  She has ulnar deviation on examination.  She has CMC, PIP, and DIP thickening consistent with underlying osteoarthritis.  Overall she is clinically been doing well on Orencia 125 mg subcutaneous injections once weekly.  She has not missed any doses and is tolerating it without any side effects.  She has not had any recent infections.  She continues to have chronic pain in the left shoulder which is replaced and in both hands.  She has been taking tramadol and Tylenol for pain relief.  She will continue on Orencia as prescribed.  She was advised to notify us if she develops increased joint pain or joint swelling.  She will follow-up in the office in 5 months  High risk medication use - Orencia 125 mg subq injection once weekly started on 12/08/17.  (Plaquenil was discontinued).  She  discontinued Methotrexate due to experiencing increased SOB.  We discussed the importance of holding Orencia if she develops any signs or symptoms of an infection and to resume once the infection has completely cleared.  She has received both COVID-19 vaccinations.  CBC and CMP were within normal limits on 04/04/2019.  She is due to update lab work today.  Orders for CBC and CMP were  released.  She will return for lab work in September and every 3 months to monitor for drug toxicity.  Standing orders for CBC and CMP are placed today.  TB gold was negative on 11/04/2018.  A future order for TB gold was placed.- Plan: CBC with Differential/Platelet, COMPLETE METABOLIC PANEL WITH GFR, CBC with Differential/Platelet, COMPLETE METABOLIC PANEL WITH GFR, QuantiFERON-TB Gold Plus  Primary osteoarthritis of both hands: She has CMC joint, PIP, and DIP thickening consistent with osteoarthritis of both hands.  She has incomplete fist formation bilaterally.  She has chronic pain and stiffness in both hands.  Joint protection and muscle strengthening were discussed.  H/O total shoulder replacement, left: Chronic pain.  She has painful and extremely limited range of motion with abduction and internal rotation.  She continues to experience nocturnal pain.  She has been taking tramadol and Tylenol as needed for pain relief.  She has been released by Dr. Devonne Doughty.  Status post right knee replacement: Doing well.  She has good range of motion with no discomfort.  She is using a cane to assist with ambulation.  Primary osteoarthritis of left knee: Doing well.  She has no discomfort at this time.  She is good range of motion with no warmth or effusion.  Primary osteoarthritis of both feet: She has PIP DIP thickening consistent with osteoarthritis of both feet.  No tenderness or inflammation was noted.  She was proper fitting shoes.  She is not having any discomfort in her feet at this time.  Osteopenia of multiple sites: DEXA on  02/22/18: The BMD measured at Femur Total Right is 0.742 g/cm2 with a T-score of -2.1.  She is taking Fosamax 70 mg 1 tablet by mouth once weekly and is tolerating it without any side effects.  She continues to take a vitamin D supplement on a daily basis.  She has not had any recent falls or fractures.  She has been walking with a cane to assist with ambulation.  She is due to update her bone density in December 2021.  History of vitamin D deficiency: She is taking a vitamin D supplement on a daily basis.   Postural kyphosis of thoracic region: She has thoracic kyphosis and scoliosis on exam.  No midline spinal tenderness at this time.   Other medical conditions are listed as follows:   History of cataract  History of diverticulosis  Dyslipidemia  History of peripheral neuropathy  History of multiple sclerosis (HCC)  History of GI bleed    Orders: Orders Placed This Encounter  Procedures  . CBC with Differential/Platelet  . COMPLETE METABOLIC PANEL WITH GFR  . CBC with Differential/Platelet  . COMPLETE METABOLIC PANEL WITH GFR  . QuantiFERON-TB Gold Plus   No orders of the defined types were placed in this encounter.    Follow-Up Instructions: Return in about 5 months (around 01/04/2020) for Rheumatoid arthritis, Osteoarthritis.  Sherron Ales, PA-C  I examined and evaluated the patient with Sherron Ales PA. Patient is overall doing much better on Orencia. She had minimal synovitis over her PIPs. She will continue current treatment. The plan of care was discussed as noted above.  Pollyann Savoy, MD   Note - This record has been created using [employer].  Chart creation errors have been sought, but may not always  have been located. Such creation errors do not reflect on  the standard of medical care.

## 2019-08-02 MED FILL — ORENCIA 125 MG/ML SYRINGE: 125 | 28 days supply | Qty: 4 | Fill #2

## 2019-08-04 ENCOUNTER — Other Ambulatory Visit: Payer: Self-pay

## 2019-08-04 ENCOUNTER — Ambulatory Visit: Payer: Medicare PPO | Admitting: Rheumatology

## 2019-08-04 ENCOUNTER — Encounter: Payer: Self-pay | Admitting: Rheumatology

## 2019-08-04 VITALS — BP 111/70 | HR 69 | Resp 12 | Ht 63.0 in | Wt 116.8 lb

## 2019-08-04 DIAGNOSIS — M19071 Primary osteoarthritis, right ankle and foot: Secondary | ICD-10-CM

## 2019-08-04 DIAGNOSIS — M1712 Unilateral primary osteoarthritis, left knee: Secondary | ICD-10-CM

## 2019-08-04 DIAGNOSIS — M4004 Postural kyphosis, thoracic region: Secondary | ICD-10-CM | POA: Diagnosis not present

## 2019-08-04 DIAGNOSIS — M8589 Other specified disorders of bone density and structure, multiple sites: Secondary | ICD-10-CM

## 2019-08-04 DIAGNOSIS — E785 Hyperlipidemia, unspecified: Secondary | ICD-10-CM

## 2019-08-04 DIAGNOSIS — M19041 Primary osteoarthritis, right hand: Secondary | ICD-10-CM

## 2019-08-04 DIAGNOSIS — G35 Multiple sclerosis: Secondary | ICD-10-CM

## 2019-08-04 DIAGNOSIS — Z8639 Personal history of other endocrine, nutritional and metabolic disease: Secondary | ICD-10-CM

## 2019-08-04 DIAGNOSIS — Z79899 Other long term (current) drug therapy: Secondary | ICD-10-CM | POA: Diagnosis not present

## 2019-08-04 DIAGNOSIS — M19072 Primary osteoarthritis, left ankle and foot: Secondary | ICD-10-CM

## 2019-08-04 DIAGNOSIS — Z8669 Personal history of other diseases of the nervous system and sense organs: Secondary | ICD-10-CM

## 2019-08-04 DIAGNOSIS — Z96651 Presence of right artificial knee joint: Secondary | ICD-10-CM

## 2019-08-04 DIAGNOSIS — Z96612 Presence of left artificial shoulder joint: Secondary | ICD-10-CM

## 2019-08-04 DIAGNOSIS — Z8719 Personal history of other diseases of the digestive system: Secondary | ICD-10-CM

## 2019-08-04 DIAGNOSIS — M0579 Rheumatoid arthritis with rheumatoid factor of multiple sites without organ or systems involvement: Secondary | ICD-10-CM | POA: Diagnosis not present

## 2019-08-04 DIAGNOSIS — M19042 Primary osteoarthritis, left hand: Secondary | ICD-10-CM

## 2019-08-04 NOTE — Patient Instructions (Signed)
Standing Labs We placed an order today for your standing lab work.    Please come back and get your standing labs in September and every 3 months   We have open lab daily Monday through Thursday from 8:30-12:30 PM and 1:30-4:30 PM and Friday from 8:30-12:30 PM and 1:30-4:00 PM at the office of Dr. Shaili Deveshwar.   You may experience shorter wait times on Monday and Friday afternoons. The office is located at [address], [address], [place], [place] [address] No appointment is necessary.   Labs are drawn by [facility].  You may receive a bill from [facility] for your lab work.  If you wish to have your labs drawn at another location, please call the office 24 hours in advance to send orders.  If you have any questions regarding directions or hours of operation,  please call [phone].   Just as a reminder please drink plenty of water prior to coming for your lab work. Thanks!   

## 2019-08-05 LAB — COMPLETE METABOLIC PANEL WITH GFR
AG Ratio: 2.1 (calc) (ref 1.0–2.5)
ALT: 8 U/L (ref 6–29)
AST: 12 U/L (ref 10–35)
Albumin: 4.4 g/dL (ref 3.6–5.1)
Alkaline phosphatase (APISO): 73 U/L (ref 37–153)
BUN/Creatinine Ratio: 14 (calc) (ref 6–22)
BUN: 14 mg/dL (ref 7–25)
CO2: 28 mmol/L (ref 20–32)
Calcium: 9.7 mg/dL (ref 8.6–10.4)
Chloride: 99 mmol/L (ref 98–110)
Creat: 0.98 mg/dL — ABNORMAL HIGH (ref 0.60–0.93)
GFR, Est African American: 65 mL/min/{1.73_m2} (ref >=60)
GFR, Est Non African American: 56 mL/min/{1.73_m2} — ABNORMAL LOW (ref >=60)
Globulin: 2.1 g/dL (calc) (ref 1.9–3.7)
Glucose, Bld: 86 mg/dL (ref 65–99)
Potassium: 4.6 mmol/L (ref 3.5–5.3)
Sodium: 133 mmol/L — ABNORMAL LOW (ref 135–146)
Total Bilirubin: 1 mg/dL (ref 0.2–1.2)
Total Protein: 6.5 g/dL (ref 6.1–8.1)

## 2019-08-05 LAB — CBC WITH DIFFERENTIAL/PLATELET
Absolute Monocytes: 639 cells/uL (ref 200–950)
Basophils Absolute: 31 cells/uL (ref 0–200)
Basophils Relative: 0.5 %
Eosinophils Absolute: 143 cells/uL (ref 15–500)
Eosinophils Relative: 2.3 %
HCT: 35.9 % (ref 35.0–45.0)
Hemoglobin: 11.6 g/dL — ABNORMAL LOW (ref 11.7–15.5)
Lymphs Abs: 1984 cells/uL (ref 850–3900)
MCH: 30 pg (ref 27.0–33.0)
MCHC: 32.3 g/dL (ref 32.0–36.0)
MCV: 92.8 fL (ref 80.0–100.0)
MPV: 9.4 fL (ref 7.5–12.5)
Monocytes Relative: 10.3 %
Neutro Abs: 3404 cells/uL (ref 1500–7800)
Neutrophils Relative %: 54.9 %
Platelets: 297 10*3/uL (ref 140–400)
RBC: 3.87 10*6/uL (ref 3.80–5.10)
RDW: 13.1 % (ref 11.0–15.0)
Total Lymphocyte: 32 %
WBC: 6.2 10*3/uL (ref 3.8–10.8)

## 2019-08-05 NOTE — Progress Notes (Signed)
Hemoglobin is borderline low but stable. Rest of CBC WNL.  Creatinine is mildly elevated and GFR is 56.  Please advise the patient to only take naproxen as needed.

## 2019-08-18 ENCOUNTER — Telehealth: Payer: Self-pay | Admitting: *Deleted

## 2019-08-18 DIAGNOSIS — M542 Cervicalgia: Secondary | ICD-10-CM | POA: Diagnosis not present

## 2019-08-18 NOTE — Telephone Encounter (Signed)
I returned the call to her patient's husband. Reports the patient was reading the newspaper and felt a sudden jolt of pain from her cervical spine down to her lumbar spine. She has continued to have some discomfort. This is a new onset problem that started on 08/11/19. In order to be evaluated quickly, her husband will call her PCP for the initial work-up. This will also help to determine if this issue would be more appropriately followed by an orthopaedic or neurologist.

## 2019-08-18 NOTE — Telephone Encounter (Signed)
Received call from patient's husband, [name] on [place] who asked for follow up very soon. Patient last seen 11/2017. He stated she "did something to her back and is in a lot of pain". Dr Zannie Cove soonest FU is 7/29.  I advised will send message to RN to see if she can be worked in sooner. [name]  verbalized understanding, appreciation. Please return call on his mobile #.

## 2019-08-23 DIAGNOSIS — M542 Cervicalgia: Secondary | ICD-10-CM | POA: Diagnosis not present

## 2019-08-29 ENCOUNTER — Other Ambulatory Visit: Payer: Self-pay | Admitting: Rheumatology

## 2019-08-29 NOTE — Telephone Encounter (Signed)
Please schedule patient a follow up visit. Patient due November 2021. Thanks!

## 2019-08-29 NOTE — Telephone Encounter (Signed)
Last Visit: 08/04/2019 Next Visit due November 2021. Message sent to the front to schedule patient.  Labs: 08/04/2019 Hemoglobin is borderline low but stable. Rest of CBC WNL. Creatinine is mildly elevated and GFR is 56.  TB Gold:   Current Dose per office note on 08/04/2019: Orencia 125 mg subq injection once weekly  DX: Rheumatoid arthritis   Okay to refill Orencia?

## 2019-08-31 MED FILL — ORENCIA 125 MG/ML SYRINGE: 125 | 28 days supply | Qty: 4 | Fill #0

## 2019-09-07 DIAGNOSIS — D1801 Hemangioma of skin and subcutaneous tissue: Secondary | ICD-10-CM | POA: Diagnosis not present

## 2019-09-07 DIAGNOSIS — L821 Other seborrheic keratosis: Secondary | ICD-10-CM | POA: Diagnosis not present

## 2019-09-07 DIAGNOSIS — L814 Other melanin hyperpigmentation: Secondary | ICD-10-CM | POA: Diagnosis not present

## 2019-09-07 DIAGNOSIS — L820 Inflamed seborrheic keratosis: Secondary | ICD-10-CM | POA: Diagnosis not present

## 2019-09-26 ENCOUNTER — Other Ambulatory Visit: Payer: Self-pay | Admitting: Physician Assistant

## 2019-09-26 NOTE — Telephone Encounter (Signed)
Last Visit: 08/04/2019 Next Visit: 01/03/2020 Labs: 08/04/2019 Hemoglobin is borderline low but stable. Rest of CBC WNL. Creatinine is mildly elevated and GFR is 56.  TB Gold: 11/04/2018 Neg  Current Dose per office note on 08/04/2019: Orencia 125 mg subq injection once weekly  DX: Rheumatoid arthritis   Okay to refill Orencia?

## 2019-09-29 MED FILL — ORENCIA 125 MG/ML SYRINGE: 125 | 28 days supply | Qty: 4 | Fill #0

## 2019-10-10 DIAGNOSIS — Z6820 Body mass index (BMI) 20.0-20.9, adult: Secondary | ICD-10-CM | POA: Diagnosis not present

## 2019-10-10 DIAGNOSIS — M810 Age-related osteoporosis without current pathological fracture: Secondary | ICD-10-CM | POA: Diagnosis not present

## 2019-10-10 DIAGNOSIS — G4700 Insomnia, unspecified: Secondary | ICD-10-CM | POA: Diagnosis not present

## 2019-10-10 DIAGNOSIS — I10 Essential (primary) hypertension: Secondary | ICD-10-CM | POA: Diagnosis not present

## 2019-10-25 ENCOUNTER — Other Ambulatory Visit: Payer: Self-pay | Admitting: Rheumatology

## 2019-10-25 DIAGNOSIS — K529 Noninfective gastroenteritis and colitis, unspecified: Secondary | ICD-10-CM | POA: Diagnosis not present

## 2019-10-25 DIAGNOSIS — K5730 Diverticulosis of large intestine without perforation or abscess without bleeding: Secondary | ICD-10-CM | POA: Diagnosis not present

## 2019-10-25 DIAGNOSIS — R1032 Left lower quadrant pain: Secondary | ICD-10-CM | POA: Diagnosis not present

## 2019-10-25 DIAGNOSIS — Z8371 Family history of colonic polyps: Secondary | ICD-10-CM | POA: Diagnosis not present

## 2019-10-25 DIAGNOSIS — R933 Abnormal findings on diagnostic imaging of other parts of digestive tract: Secondary | ICD-10-CM | POA: Diagnosis not present

## 2019-10-25 DIAGNOSIS — K921 Melena: Secondary | ICD-10-CM | POA: Diagnosis not present

## 2019-10-25 MED ORDER — ORENCIA CLICKJECT 125 MG/ML ~~LOC~~ SOAJ
125.0000 mg | SUBCUTANEOUS | 0 refills | Status: DC
Start: 2019-10-25 — End: 2020-09-05

## 2019-10-25 NOTE — Telephone Encounter (Signed)
Please call the patient to notify her that she will need to update TB gold the first week of September 2021.

## 2019-10-25 NOTE — Telephone Encounter (Signed)
Last Visit: 08/04/2019 Next Visit: 01/03/2020 Labs: 6/3/2021Hemoglobin is borderline low but stable. Rest of CBC WNL. Creatinine is mildly elevated and GFR is 56. TB Gold: 11/04/2018 Neg  Current Dose per office note on 08/04/2019:Orencia 125 mg subq injection once weekly WN:[id number] arthritis  Okay to refill Orencia as [employer]?

## 2019-10-25 NOTE — Telephone Encounter (Signed)
Patient's husband Michele Bryan called requesting prescription refill of Orencia and is asking if [name] can have the clickject autoinjector.  Michele Bryan [employer] received the clickject last month as a replacement and prefers the autoinjector.  Patient states they called [name] and was told to contact [name] to make the change.

## 2019-10-25 NOTE — Telephone Encounter (Signed)
Left message to advise patient she is due to update her TB Gold at the beginning of September 2021.

## 2019-10-28 DIAGNOSIS — M542 Cervicalgia: Secondary | ICD-10-CM | POA: Diagnosis not present

## 2019-11-01 MED FILL — ORENCIA 125 MG/ML SYRINGE: 125 | 28 days supply | Qty: 4 | Fill #1

## 2019-11-03 DIAGNOSIS — R102 Pelvic and perineal pain: Secondary | ICD-10-CM | POA: Diagnosis not present

## 2019-11-10 DIAGNOSIS — R102 Pelvic and perineal pain: Secondary | ICD-10-CM | POA: Diagnosis not present

## 2019-11-17 DIAGNOSIS — M542 Cervicalgia: Secondary | ICD-10-CM | POA: Diagnosis not present

## 2019-11-18 DIAGNOSIS — S129XXA Fracture of neck, unspecified, initial encounter: Secondary | ICD-10-CM | POA: Diagnosis not present

## 2019-11-24 ENCOUNTER — Other Ambulatory Visit: Payer: Self-pay | Admitting: Neurological Surgery

## 2019-11-24 DIAGNOSIS — S129XXA Fracture of neck, unspecified, initial encounter: Secondary | ICD-10-CM | POA: Diagnosis not present

## 2019-11-25 ENCOUNTER — Other Ambulatory Visit (HOSPITAL_COMMUNITY): Payer: Self-pay | Admitting: Neurological Surgery

## 2019-11-25 ENCOUNTER — Other Ambulatory Visit: Payer: Self-pay | Admitting: Neurological Surgery

## 2019-11-25 DIAGNOSIS — S129XXA Fracture of neck, unspecified, initial encounter: Secondary | ICD-10-CM

## 2019-11-30 ENCOUNTER — Other Ambulatory Visit: Payer: Self-pay

## 2019-11-30 ENCOUNTER — Ambulatory Visit (HOSPITAL_BASED_OUTPATIENT_CLINIC_OR_DEPARTMENT_OTHER)
Admission: RE | Admit: 2019-11-30 | Discharge: 2019-11-30 | Disposition: A | Discharge status: Home / Self Care | Payer: Medicare PPO | Source: Ambulatory Visit | Attending: Neurological Surgery | Admitting: Neurological Surgery

## 2019-11-30 DIAGNOSIS — S12190A Other displaced fracture of second cervical vertebra, initial encounter for closed fracture: Secondary | ICD-10-CM | POA: Diagnosis not present

## 2019-11-30 DIAGNOSIS — S13120A Subluxation of C1/C2 cervical vertebrae, initial encounter: Secondary | ICD-10-CM | POA: Diagnosis not present

## 2019-11-30 DIAGNOSIS — S12110A Anterior displaced Type II dens fracture, initial encounter for closed fracture: Secondary | ICD-10-CM | POA: Diagnosis not present

## 2019-11-30 DIAGNOSIS — S129XXA Fracture of neck, unspecified, initial encounter: Secondary | ICD-10-CM | POA: Insufficient documentation

## 2019-11-30 DIAGNOSIS — S12000A Unspecified displaced fracture of first cervical vertebra, initial encounter for closed fracture: Secondary | ICD-10-CM | POA: Diagnosis not present

## 2019-11-30 MED FILL — ORENCIA 125 MG/ML SYRINGE: 125 | 28 days supply | Qty: 4 | Fill #2

## 2019-12-01 NOTE — Progress Notes (Addendum)
Your procedure is scheduled on Tuesday, October 5th.  Report to [facility] Main Entrance "A" at 8:40 A.M., and check in at the Admitting office.  Call this number if you have problems the morning of surgery:  [phone]  Call [phone] if you have any questions prior to your surgery date Monday-Friday 8am-4pm   Remember:  Do not eat or drink after midnight the night before your surgery    Take these medicines the morning of surgery with A SIP OF WATER  gabapentin (NEURONTIN)  omeprazole (PRILOSEC)   If needed: acetaminophen (TYLENOL)  As of today, STOP taking any Aspirin (unless otherwise instructed by your surgeon) Aleve, Naproxen, Ibuprofen, Motrin, Advil, Goody's, BC's, all herbal medications, fish oil, and all vitamins.                     Do not wear jewelry, make up, or nail polish            Do not wear lotions, powders, perfumes, or deodorant.            Do not shave 48 hours prior to surgery.            Do not bring valuables to the hospital.            [facility] is not responsible for any belongings or valuables.  Do NOT Smoke (Tobacco/Vaping) or drink Alcohol 24 hours prior to your procedure If you use a CPAP at night, you may bring all equipment for your overnight stay.   Contacts, glasses, dentures or bridgework may not be worn into surgery.      For patients admitted to the hospital, discharge time will be determined by your treatment team.   Patients discharged the day of surgery will not be allowed to drive home, and someone needs to stay with them for 24 hours.  Special instructions:   [facility]- Preparing For Surgery  Before surgery, you can play an important role. Because skin is not sterile, your skin needs to be as free of germs as possible. You can reduce the number of germs on your skin by washing with CHG (chlorahexidine gluconate) Soap before surgery.  CHG is an antiseptic cleaner which kills germs and bonds with the skin to continue killing  germs even after washing.    Oral Hygiene is also important to reduce your risk of infection.  Remember - BRUSH YOUR TEETH THE MORNING OF SURGERY WITH YOUR REGULAR TOOTHPASTE  Please do not use if you have an allergy to CHG or antibacterial soaps. If your skin becomes reddened/irritated stop using the CHG.  Do not shave (including legs and underarms) for at least 48 hours prior to first CHG shower. It is OK to shave your face.  Please follow these instructions carefully.   1. Shower the NIGHT BEFORE SURGERY and the MORNING OF SURGERY with CHG Soap.   2. If you chose to wash your hair, wash your hair first as usual with your normal shampoo.  3. After you shampoo, rinse your hair and body thoroughly to remove the shampoo.  4. Use CHG as you would any other liquid soap. You can apply CHG directly to the skin and wash gently with a scrungie or a clean washcloth.   5. Apply the CHG Soap to your body ONLY FROM THE NECK DOWN.  Do not use on open wounds or open sores. Avoid contact with your eyes, ears, mouth and genitals (private parts). Wash Face and genitals (  private parts)  with your normal soap.   6. Wash thoroughly, paying special attention to the area where your surgery will be performed.  7. Thoroughly rinse your body with warm water from the neck down.  8. DO NOT shower/wash with your normal soap after using and rinsing off the CHG Soap.  9. Pat yourself dry with a CLEAN TOWEL.  10. Wear CLEAN PAJAMAS to bed the night before surgery  11. Place CLEAN SHEETS on your bed the night of your first shower and DO NOT SLEEP WITH PETS.  Day of Surgery: Wear Clean/Comfortable clothing the morning of surgery Do not apply any deodorants/lotions.   Remember to brush your teeth WITH YOUR REGULAR TOOTHPASTE.   Please read over the following fact sheets that you were given.

## 2019-12-02 ENCOUNTER — Encounter (HOSPITAL_COMMUNITY)
Admission: RE | Admit: 2019-12-02 | Discharge: 2019-12-02 | Disposition: A | Discharge status: Home / Self Care | Payer: Medicare PPO | Source: Ambulatory Visit | Attending: Neurological Surgery | Admitting: Neurological Surgery

## 2019-12-02 ENCOUNTER — Other Ambulatory Visit (HOSPITAL_COMMUNITY)
Admission: RE | Admit: 2019-12-02 | Discharge: 2019-12-02 | Disposition: A | Discharge status: Home / Self Care | Payer: Medicare PPO | Source: Ambulatory Visit | Attending: Neurological Surgery | Admitting: Neurological Surgery

## 2019-12-02 ENCOUNTER — Other Ambulatory Visit: Payer: Self-pay

## 2019-12-02 ENCOUNTER — Encounter (HOSPITAL_COMMUNITY): Payer: Self-pay

## 2019-12-02 DIAGNOSIS — K219 Gastro-esophageal reflux disease without esophagitis: Secondary | ICD-10-CM | POA: Diagnosis not present

## 2019-12-02 DIAGNOSIS — Z96651 Presence of right artificial knee joint: Secondary | ICD-10-CM | POA: Diagnosis not present

## 2019-12-02 DIAGNOSIS — G629 Polyneuropathy, unspecified: Secondary | ICD-10-CM | POA: Insufficient documentation

## 2019-12-02 DIAGNOSIS — Z20822 Contact with and (suspected) exposure to covid-19: Secondary | ICD-10-CM | POA: Diagnosis not present

## 2019-12-02 DIAGNOSIS — M069 Rheumatoid arthritis, unspecified: Secondary | ICD-10-CM | POA: Insufficient documentation

## 2019-12-02 DIAGNOSIS — S12100A Unspecified displaced fracture of second cervical vertebra, initial encounter for closed fracture: Secondary | ICD-10-CM | POA: Insufficient documentation

## 2019-12-02 DIAGNOSIS — Z96612 Presence of left artificial shoulder joint: Secondary | ICD-10-CM | POA: Diagnosis not present

## 2019-12-02 DIAGNOSIS — G35 Multiple sclerosis: Secondary | ICD-10-CM | POA: Insufficient documentation

## 2019-12-02 DIAGNOSIS — D649 Anemia, unspecified: Secondary | ICD-10-CM | POA: Insufficient documentation

## 2019-12-02 DIAGNOSIS — Z791 Long term (current) use of non-steroidal anti-inflammatories (NSAID): Secondary | ICD-10-CM | POA: Insufficient documentation

## 2019-12-02 DIAGNOSIS — E785 Hyperlipidemia, unspecified: Secondary | ICD-10-CM | POA: Insufficient documentation

## 2019-12-02 DIAGNOSIS — Z01812 Encounter for preprocedural laboratory examination: Secondary | ICD-10-CM | POA: Insufficient documentation

## 2019-12-02 DIAGNOSIS — E890 Postprocedural hypothyroidism: Secondary | ICD-10-CM | POA: Insufficient documentation

## 2019-12-02 DIAGNOSIS — Z7983 Long term (current) use of bisphosphonates: Secondary | ICD-10-CM | POA: Insufficient documentation

## 2019-12-02 DIAGNOSIS — S12000A Unspecified displaced fracture of first cervical vertebra, initial encounter for closed fracture: Secondary | ICD-10-CM | POA: Diagnosis not present

## 2019-12-02 DIAGNOSIS — W19XXXA Unspecified fall, initial encounter: Secondary | ICD-10-CM | POA: Diagnosis not present

## 2019-12-02 DIAGNOSIS — Z9882 Breast implant status: Secondary | ICD-10-CM | POA: Insufficient documentation

## 2019-12-02 DIAGNOSIS — Z79899 Other long term (current) drug therapy: Secondary | ICD-10-CM | POA: Diagnosis not present

## 2019-12-02 LAB — CBC
HCT: 36.9 % (ref 36.0–46.0)
Hemoglobin: 11.7 g/dL — ABNORMAL LOW (ref 12.0–15.0)
MCH: 30.4 pg (ref 26.0–34.0)
MCHC: 31.7 g/dL (ref 30.0–36.0)
MCV: 95.8 fL (ref 80.0–100.0)
Platelets: 306 10*3/uL (ref 150–400)
RBC: 3.85 MIL/uL — ABNORMAL LOW (ref 3.87–5.11)
RDW: 13.4 % (ref 11.5–15.5)
WBC: 7.9 10*3/uL (ref 4.0–10.5)
nRBC: 0 % (ref 0.0–0.2)

## 2019-12-02 LAB — BASIC METABOLIC PANEL
Anion gap: 9 (ref 5–15)
BUN: 17 mg/dL (ref 8–23)
CO2: 26 mmol/L (ref 22–32)
Calcium: 9.5 mg/dL (ref 8.9–10.3)
Chloride: 100 mmol/L (ref 98–111)
Creatinine, Ser: 0.67 mg/dL (ref 0.44–1.00)
GFR calc Af Amer: >60 mL/min (ref >60)
GFR calc non Af Amer: >60 mL/min (ref >60)
Glucose, Bld: 100 mg/dL — ABNORMAL HIGH (ref 70–99)
Potassium: 4.7 mmol/L (ref 3.5–5.1)
Sodium: 135 mmol/L (ref 135–145)

## 2019-12-02 LAB — SURGICAL PCR SCREEN
MRSA, PCR: NEGATIVE
Staphylococcus aureus: NEGATIVE

## 2019-12-02 LAB — SARS CORONAVIRUS 2 (TAT 6-24 HRS): SARS Coronavirus 2: NEGATIVE

## 2019-12-02 NOTE — Progress Notes (Signed)
PCP - lisa miller @ [facility] Cardiologist - na   Chest x-ray - na EKG -  12/02/19 Stress Test - na ECHO - na Cardiac Cath - na  Sleep Study - na   Fasting Blood Sugar - na   Blood Thinner Instructions: na Aspirin Instructions: na   COVID TEST-  12/02/19   Anesthesia review:   Patient denies shortness of breath, fever, cough and chest pain at PAT appointment   All instructions explained to the patient, with a verbal understanding of the material. Patient agrees to go over the instructions while at home for a better understanding. Patient also instructed to self quarantine after being tested for COVID-19. The opportunity to ask questions was provided.

## 2019-12-05 NOTE — Anesthesia Preprocedure Evaluation (Addendum)
Anesthesia Evaluation  Patient identified by MRN, date of birth, ID band Patient awake    Reviewed: Allergy & Precautions, H&P , NPO status , Patient's Chart, lab work & pertinent test results  History of Anesthesia Complications (+) PONV  Airway Mallampati: II  TM Distance: >3 FB Neck ROM: Limited    Dental no notable dental hx. (+) Teeth Intact, Dental Advisory Given   Pulmonary neg pulmonary ROS,    Pulmonary exam normal breath sounds clear to auscultation       Cardiovascular hypertension,  Rhythm:Regular Rate:Normal     Neuro/Psych negative neurological ROS  negative psych ROS   GI/Hepatic negative GI ROS, Neg liver ROS,   Endo/Other  negative endocrine ROS  Renal/GU negative Renal ROS  negative genitourinary   Musculoskeletal  (+) Arthritis , Rheumatoid disorders,    Abdominal   Peds  Hematology  (+) Blood dyscrasia, anemia ,   Anesthesia Other Findings   Reproductive/Obstetrics negative OB ROS                          Anesthesia Physical Anesthesia Plan  ASA: II  Anesthesia Plan: General   Post-op Pain Management:    Induction: Intravenous  PONV Risk Score and Plan: 4 or greater and Dexamethasone, Midazolam, Ondansetron and Treatment may vary due to age or medical condition  Airway Management Planned: Oral ETT  Additional Equipment:   Intra-op Plan:   Post-operative Plan: Extubation in OR  Informed Consent: I have reviewed the patients History and Physical, chart, labs and discussed the procedure including the risks, benefits and alternatives for the proposed anesthesia with the patient or authorized representative who has indicated his/her understanding and acceptance.     Dental advisory given  Plan Discussed with: CRNA  Anesthesia Plan Comments: (PAT note written 12/05/2019 by Shonna Chock, PA-C. )       Anesthesia Quick Evaluation

## 2019-12-05 NOTE — Progress Notes (Signed)
Anesthesia Chart Review:  Case: [id number] Date/Time: 12/06/19 1029   Procedure: Cervical 1-2 Posterior instrumented fusion (N/A ) - 3C   Anesthesia type: General   Pre-op diagnosis: Closed fracture of cervical spine   [facility]: [facility] ROOM 20 / [facility] OR   Surgeons: Jadene Pierini, MD      DISCUSSION: Patient is a 79 year old female scheduled for the above procedure.  History includes never smoker, postoperative N/V, HTN, HLD, GERD, anemia, multiple sclerosis, RA, peripheral neuropathy, thyroid lobectomy, augmentation mammaplasty, TKA (right 06/15/12), shoulder surgery (left reverse shoulder 05/15/17; ORIF left scapula 03/05/18; left shoulder open hardware removal, polyethylene exchange for reverse shoulder replacement 10/11/18).  Patient with C1 and C2 fractures on 11/30/19 CT (apparently had a fall all the way back in January). Preoperative labs acceptable. EKG showed NSR, septal infarct (age undetermined). She denied shortness of breath, cough, fever, chest pain and PAT RN visit.  12/02/2019 presurgical COVID-19 test negative.  Anesthesia team to evaluate on the day of surgery.   VS: BP 131/86   Pulse 93   Temp 36.5 C (Oral)   Resp 18   Ht 5\' 4"  (1.626 m)   Wt 48.6 kg   SpO2 100%   BMI 18.38 kg/m    PROVIDERS: , MD is PCP  Sigmund Hazel, MD is rheumatologist Pollyann Savoy, MD is GI    LABS: Labs reviewed: Acceptable for surgery. (all labs ordered are listed, but only abnormal results are displayed)  Labs Reviewed  BASIC METABOLIC PANEL - Abnormal; Notable for the following components:      Result Value   Glucose, Bld 100 (*)    All other components within normal limits  CBC - Abnormal; Notable for the following components:   RBC 3.85 (*)    Hemoglobin 11.7 (*)    All other components within normal limits  SURGICAL PCR SCREEN  TYPE AND SCREEN     IMAGES: CT C-spine 11/30/19: IMPRESSION: 1. Persistent anterior displacement of the dens relative to the  C2 body, measuring 1.2 cm. 2. Bilateral fractures through the posterior ring of C1 without displacement. 3. Anterior subluxation of the C1-2 facet joints with locked/jumped position of the right facet.   EKG: 12/02/19: Normal sinus rhythm Septal infarct , age undetermined Abnormal ECG Confirmed by 02/01/20 [phone]) on 12/03/2019 1:22:25 PM   CV: Denied prior stress test, echo, cardiac cath   Past Medical History:  Diagnosis Date  . Anemia   . Arthritis   . GERD (gastroesophageal reflux disease)   . History of blood transfusion   . Hyperlipidemia   . Hypertension   . MS (multiple sclerosis) (HCC)   . Neuromuscular disorder (HCC)    multiple scleroosis/peripheral neuropathy  . PONV (postoperative nausea and vomiting)   . Rheumatoid arthritis (HCC)   . Ulcer   . Vitamin D deficiency     Past Surgical History:  Procedure Laterality Date  . AUGMENTATION MAMMAPLASTY    . breast augumentation    . BREAST SURGERY     biopsy  . COLONOSCOPY    . DILATION AND CURETTAGE OF UTERUS    . EYE SURGERY     both eys,cataracts  . goiter    . KNEE ARTHROSCOPY Right   . ORIF SHOULDER FRACTURE Left 03/05/2018   Procedure: OPEN REDUCTION INTERNAL FIXATION (ORIF) LEFT SCAPULA;  Surgeon: 05/04/2018, MD;  Location: [facility] OR;  Service: Orthopedics;  Laterality: Left;  . REVERSE SHOULDER ARTHROPLASTY Left 05/15/2017   Procedure: LEFT REVERSE SHOULDER  ARTHROPLASTY;  Surgeon: Beverely Low, MD;  Location: [facility] OR;  Service: Orthopedics;  Laterality: Left;  . REVERSE SHOULDER ARTHROPLASTY Left 10/11/2018   Procedure: left shoulder irrigation and debridement, open poly exchange and removal of painful hardware;  Surgeon: Beverely Low, MD;  Location: [facility] ORS;  Service: Orthopedics;  Laterality: Left;  . THYROID LOBECTOMY    . TOTAL KNEE ARTHROPLASTY Right 06/15/2012   Procedure: RIGHT TOTAL KNEE ARTHROPLASTY;  Surgeon: [name], MD;  Location: [facility] ORS;  Service: Orthopedics;  Laterality:  Right;  . WRIST SURGERY Left     MEDICATIONS: . Abatacept (ORENCIA CLICKJECT) 125 MG/ML SOAJ  . acetaminophen (TYLENOL) 650 MG CR tablet  . alendronate (FOSAMAX) 70 MG tablet  . Cholecalciferol (VITAMIN D) 125 MCG (5000 UT) CAPS  . ciprofloxacin (CIPRO) 500 MG tablet  . Cyanocobalamin (VITAMIN B-12) 5000 MCG TBDP  . doxylamine, Sleep, (UNISOM) 25 MG tablet  . gabapentin (NEURONTIN) 100 MG capsule  . Krill Oil 500 MG CAPS  . losartan (COZAAR) 50 MG tablet  . metroNIDAZOLE (FLAGYL) 500 MG tablet  . Misc Natural Products (CYSTEX) LIQD  . Multiple Vitamin (MULTIVITAMIN WITH MINERALS) TABS  . naproxen (NAPROSYN) 500 MG tablet  . Omega-3 Fatty Acids (FISH OIL) 1000 MG CAPS  . omeprazole (PRILOSEC) 40 MG capsule  . ORENCIA 125 MG/ML SOSY  . traMADol (ULTRAM) 50 MG tablet  . vitamin E 400 UNIT capsule  . zolpidem (AMBIEN) 10 MG tablet   No current facility-administered medications for this encounter.  Based on current medication list, she is not taking vitamin D, Cipro, vitamin B12, Unisom, Krill oil, Flagyl, Naprosyn, omega-3 fatty acids, vitamin E   .   Shonna Chock, PA-C Surgical Short Stay/Anesthesiology [facility] Phone [phone] [facility] Phone [phone] 12/05/2019 9:47 AM

## 2019-12-06 ENCOUNTER — Inpatient Hospital Stay (HOSPITAL_COMMUNITY): Payer: Medicare PPO | Admitting: Physician Assistant

## 2019-12-06 ENCOUNTER — Inpatient Hospital Stay (HOSPITAL_COMMUNITY)
Admission: RE | Disposition: A | Discharge status: Home / Self Care | Payer: Self-pay | Source: Home / Self Care | Attending: Neurological Surgery

## 2019-12-06 ENCOUNTER — Encounter (HOSPITAL_COMMUNITY): Payer: Self-pay | Admitting: Neurological Surgery

## 2019-12-06 ENCOUNTER — Inpatient Hospital Stay (HOSPITAL_COMMUNITY): Payer: Medicare PPO

## 2019-12-06 ENCOUNTER — Inpatient Hospital Stay (HOSPITAL_COMMUNITY)
Admission: RE | Admit: 2019-12-06 | Discharge: 2019-12-07 | DRG: 473 | Disposition: A | Discharge status: Home / Self Care | Payer: Medicare PPO | Attending: Neurological Surgery | Admitting: Neurological Surgery

## 2019-12-06 ENCOUNTER — Other Ambulatory Visit: Payer: Self-pay

## 2019-12-06 DIAGNOSIS — Z8249 Family history of ischemic heart disease and other diseases of the circulatory system: Secondary | ICD-10-CM | POA: Diagnosis not present

## 2019-12-06 DIAGNOSIS — S12000A Unspecified displaced fracture of first cervical vertebra, initial encounter for closed fracture: Secondary | ICD-10-CM | POA: Diagnosis not present

## 2019-12-06 DIAGNOSIS — S129XXA Fracture of neck, unspecified, initial encounter: Secondary | ICD-10-CM | POA: Diagnosis present

## 2019-12-06 DIAGNOSIS — G629 Polyneuropathy, unspecified: Secondary | ICD-10-CM | POA: Diagnosis not present

## 2019-12-06 DIAGNOSIS — S12110A Anterior displaced Type II dens fracture, initial encounter for closed fracture: Secondary | ICD-10-CM | POA: Diagnosis not present

## 2019-12-06 DIAGNOSIS — S12090A Other displaced fracture of first cervical vertebra, initial encounter for closed fracture: Secondary | ICD-10-CM | POA: Diagnosis not present

## 2019-12-06 DIAGNOSIS — E785 Hyperlipidemia, unspecified: Secondary | ICD-10-CM | POA: Diagnosis present

## 2019-12-06 DIAGNOSIS — M542 Cervicalgia: Secondary | ICD-10-CM | POA: Diagnosis present

## 2019-12-06 DIAGNOSIS — I10 Essential (primary) hypertension: Secondary | ICD-10-CM | POA: Diagnosis present

## 2019-12-06 DIAGNOSIS — E559 Vitamin D deficiency, unspecified: Secondary | ICD-10-CM | POA: Diagnosis present

## 2019-12-06 DIAGNOSIS — S13120A Subluxation of C1/C2 cervical vertebrae, initial encounter: Secondary | ICD-10-CM | POA: Diagnosis present

## 2019-12-06 DIAGNOSIS — G35 Multiple sclerosis: Secondary | ICD-10-CM | POA: Diagnosis present

## 2019-12-06 DIAGNOSIS — K219 Gastro-esophageal reflux disease without esophagitis: Secondary | ICD-10-CM | POA: Diagnosis present

## 2019-12-06 DIAGNOSIS — M069 Rheumatoid arthritis, unspecified: Secondary | ICD-10-CM | POA: Diagnosis present

## 2019-12-06 DIAGNOSIS — W19XXXA Unspecified fall, initial encounter: Secondary | ICD-10-CM | POA: Diagnosis present

## 2019-12-06 DIAGNOSIS — Z981 Arthrodesis status: Secondary | ICD-10-CM | POA: Diagnosis not present

## 2019-12-06 DIAGNOSIS — Z419 Encounter for procedure for purposes other than remedying health state, unspecified: Secondary | ICD-10-CM

## 2019-12-06 DIAGNOSIS — M4322 Fusion of spine, cervical region: Secondary | ICD-10-CM | POA: Diagnosis not present

## 2019-12-06 DIAGNOSIS — S12190A Other displaced fracture of second cervical vertebra, initial encounter for closed fracture: Secondary | ICD-10-CM | POA: Diagnosis not present

## 2019-12-06 HISTORY — PX: POSTERIOR CERVICAL FUSION/FORAMINOTOMY: SHX5038

## 2019-12-06 SURGERY — POSTERIOR CERVICAL FUSION/FORAMINOTOMY LEVEL 1
Anesthesia: General

## 2019-12-06 MED ORDER — FENTANYL CITRATE (PF) 100 MCG/2ML IJ SOLN
INTRAMUSCULAR | Status: AC
  Filled 2019-12-06: qty 2

## 2019-12-06 MED ORDER — DOCUSATE SODIUM 100 MG PO CAPS
100.0000 mg | ORAL_CAPSULE | Freq: Two times a day (BID) | ORAL | Status: DC
  Administered 2019-12-06: 100 mg via ORAL
  Filled 2019-12-06: qty 1

## 2019-12-06 MED ORDER — SODIUM CHLORIDE 0.9% FLUSH: 3.0000 mL | INTRAVENOUS | Status: DC | PRN

## 2019-12-06 MED ORDER — OXYCODONE HCL 5 MG PO TABS
5.0000 mg | ORAL_TABLET | ORAL | Status: DC | PRN
  Administered 2019-12-06 – 2019-12-07 (×4): 5 mg via ORAL
  Filled 2019-12-06 (×4): qty 1

## 2019-12-06 MED ORDER — SUGAMMADEX SODIUM 200 MG/2ML IV SOLN
INTRAVENOUS | Status: DC | PRN
  Administered 2019-12-06: 200 mg via INTRAVENOUS

## 2019-12-06 MED ORDER — ONDANSETRON HCL 4 MG/2ML IJ SOLN
4.0000 mg | Freq: Four times a day (QID) | INTRAMUSCULAR | Status: DC | PRN
  Administered 2019-12-06 – 2019-12-07 (×2): 4 mg via INTRAVENOUS
  Filled 2019-12-06 (×2): qty 2

## 2019-12-06 MED ORDER — ONDANSETRON HCL 4 MG/2ML IJ SOLN
INTRAMUSCULAR | Status: AC
  Filled 2019-12-06: qty 2

## 2019-12-06 MED ORDER — PHENYLEPHRINE 40 MCG/ML (10ML) SYRINGE FOR IV PUSH (FOR BLOOD PRESSURE SUPPORT)
PREFILLED_SYRINGE | INTRAVENOUS | Status: AC
  Filled 2019-12-06: qty 10

## 2019-12-06 MED ORDER — ROCURONIUM BROMIDE 10 MG/ML (PF) SYRINGE
PREFILLED_SYRINGE | INTRAVENOUS | Status: AC
  Filled 2019-12-06: qty 20

## 2019-12-06 MED ORDER — ACETAMINOPHEN 650 MG RE SUPP: 650.0000 mg | RECTAL | Status: DC | PRN

## 2019-12-06 MED ORDER — LIDOCAINE HCL (CARDIAC) PF 100 MG/5ML IV SOSY
PREFILLED_SYRINGE | INTRAVENOUS | Status: DC | PRN
  Administered 2019-12-06: 60 mg via INTRAVENOUS

## 2019-12-06 MED ORDER — HYDROXYZINE HCL 50 MG/ML IM SOLN
50.0000 mg | Freq: Four times a day (QID) | INTRAMUSCULAR | Status: DC | PRN
  Administered 2019-12-06: 50 mg via INTRAMUSCULAR
  Filled 2019-12-06: qty 1

## 2019-12-06 MED ORDER — LACTATED RINGERS IV SOLN: INTRAVENOUS | Status: DC

## 2019-12-06 MED ORDER — FENTANYL CITRATE (PF) 250 MCG/5ML IJ SOLN
INTRAMUSCULAR | Status: AC
  Filled 2019-12-06: qty 5

## 2019-12-06 MED ORDER — ORAL CARE MOUTH RINSE: 15.0000 mL | Freq: Once | OROMUCOSAL | Status: AC

## 2019-12-06 MED ORDER — SODIUM CHLORIDE 0.9% FLUSH: 3.0000 mL | Freq: Two times a day (BID) | INTRAVENOUS | Status: DC

## 2019-12-06 MED ORDER — DEXAMETHASONE SODIUM PHOSPHATE 10 MG/ML IJ SOLN
INTRAMUSCULAR | Status: DC | PRN
  Administered 2019-12-06: 10 mg via INTRAVENOUS

## 2019-12-06 MED ORDER — DEXAMETHASONE SODIUM PHOSPHATE 10 MG/ML IJ SOLN
INTRAMUSCULAR | Status: AC
  Filled 2019-12-06: qty 1

## 2019-12-06 MED ORDER — THROMBIN 5000 UNITS EX SOLR
OROMUCOSAL | Status: DC | PRN
  Administered 2019-12-06 (×3): 5 mL via TOPICAL

## 2019-12-06 MED ORDER — THROMBIN 5000 UNITS EX SOLR
CUTANEOUS | Status: AC
  Filled 2019-12-06: qty 5000

## 2019-12-06 MED ORDER — GLYCOPYRROLATE PF 0.2 MG/ML IJ SOSY
PREFILLED_SYRINGE | INTRAMUSCULAR | Status: AC
  Filled 2019-12-06: qty 1

## 2019-12-06 MED ORDER — CHLORHEXIDINE GLUCONATE 0.12 % MT SOLN
15.0000 mL | Freq: Once | OROMUCOSAL | Status: AC
  Administered 2019-12-06: 15 mL via OROMUCOSAL
  Filled 2019-12-06: qty 15

## 2019-12-06 MED ORDER — LIDOCAINE-EPINEPHRINE 1 %-1:100000 IJ SOLN
INTRAMUSCULAR | Status: AC
  Filled 2019-12-06: qty 1

## 2019-12-06 MED ORDER — ROCURONIUM BROMIDE 10 MG/ML (PF) SYRINGE
PREFILLED_SYRINGE | INTRAVENOUS | Status: AC
  Filled 2019-12-06: qty 10

## 2019-12-06 MED ORDER — PANTOPRAZOLE SODIUM 40 MG PO TBEC
40.0000 mg | DELAYED_RELEASE_TABLET | Freq: Every day | ORAL | Status: DC
  Administered 2019-12-06: 40 mg via ORAL
  Filled 2019-12-06: qty 1

## 2019-12-06 MED ORDER — LOSARTAN POTASSIUM 50 MG PO TABS
50.0000 mg | ORAL_TABLET | Freq: Two times a day (BID) | ORAL | Status: DC
  Administered 2019-12-06: 50 mg via ORAL
  Filled 2019-12-06: qty 1

## 2019-12-06 MED ORDER — CHLORHEXIDINE GLUCONATE CLOTH 2 % EX PADS: 6.0000 | MEDICATED_PAD | Freq: Once | CUTANEOUS | Status: DC

## 2019-12-06 MED ORDER — ONDANSETRON HCL 4 MG PO TABS
4.0000 mg | ORAL_TABLET | Freq: Four times a day (QID) | ORAL | Status: DC | PRN
  Administered 2019-12-07: 4 mg via ORAL
  Filled 2019-12-06: qty 1

## 2019-12-06 MED ORDER — CEFAZOLIN SODIUM-DEXTROSE 2-4 GM/100ML-% IV SOLN
2.0000 g | INTRAVENOUS | Status: AC
  Administered 2019-12-06: 2 g via INTRAVENOUS
  Filled 2019-12-06: qty 100

## 2019-12-06 MED ORDER — HEMOSTATIC AGENTS (NO CHARGE) OPTIME
TOPICAL | Status: DC | PRN
  Administered 2019-12-06: 1

## 2019-12-06 MED ORDER — POLYETHYLENE GLYCOL 3350 17 G PO PACK: 17.0000 g | PACK | Freq: Every day | ORAL | Status: DC | PRN

## 2019-12-06 MED ORDER — PROPOFOL 10 MG/ML IV BOLUS
INTRAVENOUS | Status: AC
  Filled 2019-12-06: qty 20

## 2019-12-06 MED ORDER — MIDAZOLAM HCL 2 MG/2ML IJ SOLN
INTRAMUSCULAR | Status: AC
  Filled 2019-12-06: qty 2

## 2019-12-06 MED ORDER — ACETAMINOPHEN 500 MG PO TABS
1000.0000 mg | ORAL_TABLET | Freq: Once | ORAL | Status: AC
  Administered 2019-12-06: 1000 mg via ORAL
  Filled 2019-12-06: qty 2

## 2019-12-06 MED ORDER — TRAMADOL HCL 50 MG PO TABS
50.0000 mg | ORAL_TABLET | Freq: Four times a day (QID) | ORAL | Status: DC | PRN
  Administered 2019-12-06: 100 mg via ORAL
  Filled 2019-12-06: qty 2

## 2019-12-06 MED ORDER — OXYCODONE HCL 5 MG PO TABS: 10.0000 mg | ORAL_TABLET | ORAL | Status: DC | PRN

## 2019-12-06 MED ORDER — THROMBIN 5000 UNITS EX SOLR
CUTANEOUS | Status: DC | PRN
  Administered 2019-12-06 (×2): 5000 [IU] via TOPICAL

## 2019-12-06 MED ORDER — ONDANSETRON HCL 4 MG/2ML IJ SOLN
INTRAMUSCULAR | Status: DC | PRN
  Administered 2019-12-06: 4 mg via INTRAVENOUS

## 2019-12-06 MED ORDER — LIDOCAINE-EPINEPHRINE 1 %-1:100000 IJ SOLN
INTRAMUSCULAR | Status: DC | PRN
  Administered 2019-12-06: 10 mL

## 2019-12-06 MED ORDER — CYCLOBENZAPRINE HCL 10 MG PO TABS
10.0000 mg | ORAL_TABLET | Freq: Three times a day (TID) | ORAL | Status: DC | PRN
  Administered 2019-12-06 – 2019-12-07 (×2): 10 mg via ORAL
  Filled 2019-12-06 (×2): qty 1

## 2019-12-06 MED ORDER — MENTHOL 3 MG MT LOZG: 1.0000 | LOZENGE | OROMUCOSAL | Status: DC | PRN

## 2019-12-06 MED ORDER — LIDOCAINE 2% (20 MG/ML) 5 ML SYRINGE
INTRAMUSCULAR | Status: AC
  Filled 2019-12-06: qty 5

## 2019-12-06 MED ORDER — HYDROMORPHONE HCL 1 MG/ML IJ SOLN
1.0000 mg | INTRAMUSCULAR | Status: DC | PRN
  Administered 2019-12-06: 1 mg via INTRAVENOUS
  Filled 2019-12-06: qty 1

## 2019-12-06 MED ORDER — 0.9 % SODIUM CHLORIDE (POUR BTL) OPTIME
TOPICAL | Status: DC | PRN
  Administered 2019-12-06: 1000 mL

## 2019-12-06 MED ORDER — ALBUMIN HUMAN 5 % IV SOLN: INTRAVENOUS | Status: DC | PRN

## 2019-12-06 MED ORDER — LACTATED RINGERS IV SOLN: INTRAVENOUS | Status: DC | PRN

## 2019-12-06 MED ORDER — PHENOL 1.4 % MT LIQD: 1.0000 | OROMUCOSAL | Status: DC | PRN

## 2019-12-06 MED ORDER — PROPOFOL 10 MG/ML IV BOLUS
INTRAVENOUS | Status: DC | PRN
  Administered 2019-12-06: 80 mg via INTRAVENOUS
  Administered 2019-12-06: 20 mg via INTRAVENOUS

## 2019-12-06 MED ORDER — ACETAMINOPHEN 325 MG PO TABS
650.0000 mg | ORAL_TABLET | ORAL | Status: DC | PRN
  Administered 2019-12-06 – 2019-12-07 (×2): 650 mg via ORAL
  Filled 2019-12-06 (×3): qty 2

## 2019-12-06 MED ORDER — ROCURONIUM BROMIDE 100 MG/10ML IV SOLN
INTRAVENOUS | Status: DC | PRN
  Administered 2019-12-06: 30 mg via INTRAVENOUS
  Administered 2019-12-06: 40 mg via INTRAVENOUS
  Administered 2019-12-06: 20 mg via INTRAVENOUS
  Administered 2019-12-06: 40 mg via INTRAVENOUS

## 2019-12-06 MED ORDER — BACITRACIN ZINC 500 UNIT/GM EX OINT
TOPICAL_OINTMENT | CUTANEOUS | Status: DC | PRN
  Administered 2019-12-06: 1 via TOPICAL

## 2019-12-06 MED ORDER — BACITRACIN ZINC 500 UNIT/GM EX OINT
TOPICAL_OINTMENT | CUTANEOUS | Status: AC
  Filled 2019-12-06: qty 28.35

## 2019-12-06 MED ORDER — ESMOLOL HCL 100 MG/10ML IV SOLN
INTRAVENOUS | Status: AC
  Filled 2019-12-06: qty 10

## 2019-12-06 MED ORDER — PHENYLEPHRINE HCL-NACL 10-0.9 MG/250ML-% IV SOLN
INTRAVENOUS | Status: DC | PRN
  Administered 2019-12-06: 40 ug/min via INTRAVENOUS

## 2019-12-06 MED ORDER — ESMOLOL HCL 100 MG/10ML IV SOLN
INTRAVENOUS | Status: DC | PRN
  Administered 2019-12-06: 20 mg via INTRAVENOUS

## 2019-12-06 MED ORDER — SODIUM CHLORIDE 0.9% IV SOLUTION: Freq: Once | INTRAVENOUS | Status: DC

## 2019-12-06 MED ORDER — SODIUM CHLORIDE 0.9 % IV SOLN
250.0000 mL | INTRAVENOUS | Status: DC
  Administered 2019-12-06: 250 mL via INTRAVENOUS

## 2019-12-06 MED ORDER — FENTANYL CITRATE (PF) 100 MCG/2ML IJ SOLN
INTRAMUSCULAR | Status: DC | PRN
  Administered 2019-12-06: 25 ug via INTRAVENOUS
  Administered 2019-12-06 (×2): 50 ug via INTRAVENOUS
  Administered 2019-12-06: 25 ug via INTRAVENOUS
  Administered 2019-12-06 (×5): 50 ug via INTRAVENOUS

## 2019-12-06 MED ORDER — CEFAZOLIN SODIUM-DEXTROSE 2-4 GM/100ML-% IV SOLN
2.0000 g | Freq: Three times a day (TID) | INTRAVENOUS | Status: AC
  Administered 2019-12-06 – 2019-12-07 (×2): 2 g via INTRAVENOUS
  Filled 2019-12-06 (×2): qty 100

## 2019-12-06 MED ORDER — FENTANYL CITRATE (PF) 100 MCG/2ML IJ SOLN
25.0000 ug | INTRAMUSCULAR | Status: DC | PRN
  Administered 2019-12-06: 25 ug via INTRAVENOUS

## 2019-12-06 MED ORDER — ZOLPIDEM TARTRATE 5 MG PO TABS
5.0000 mg | ORAL_TABLET | Freq: Every evening | ORAL | Status: DC | PRN
  Administered 2019-12-07: 5 mg via ORAL
  Filled 2019-12-06: qty 1

## 2019-12-06 MED ORDER — THROMBIN 5000 UNITS EX SOLR
CUTANEOUS | Status: AC
  Filled 2019-12-06: qty 10000

## 2019-12-06 SURGICAL SUPPLY — 74 items
ADH SKN CLS APL DERMABOND .7 (GAUZE/BANDAGES/DRESSINGS) ×1
APL SKNCLS STERI-STRIP NONHPOA (GAUZE/BANDAGES/DRESSINGS)
BAG DECANTER FOR FLEXI CONT (MISCELLANEOUS) ×1 IMPLANT
BENZOIN TINCTURE PRP APPL 2/3 (GAUZE/BANDAGES/DRESSINGS) IMPLANT
BIT DRILL 2.4 (BIT) ×1 IMPLANT
BIT DRILL NEURO 2X3.1 SFT TUCH (MISCELLANEOUS) IMPLANT
BLADE CLIPPER SURG (BLADE) ×2 IMPLANT
BUR MATCHSTICK NEURO 3.0 LAGG (BURR) IMPLANT
BUR PRECISION FLUTE 5.0 (BURR) ×2 IMPLANT
CANISTER SUCT 3000ML PPV (MISCELLANEOUS) ×2 IMPLANT
COVER WAND RF STERILE (DRAPES) ×2 IMPLANT
DECANTER SPIKE VIAL GLASS SM (MISCELLANEOUS) ×2 IMPLANT
DERMABOND ADVANCED (GAUZE/BANDAGES/DRESSINGS) ×1
DERMABOND ADVANCED .7 DNX12 (GAUZE/BANDAGES/DRESSINGS) ×1 IMPLANT
DRAPE C-ARM 42X72 X-RAY (DRAPES) ×4 IMPLANT
DRAPE LAPAROTOMY 100X72 PEDS (DRAPES) ×2 IMPLANT
DRILL NEURO 2X3.1 SOFT TOUCH (MISCELLANEOUS) ×2
DURAPREP 6ML APPLICATOR 50/CS (WOUND CARE) ×3 IMPLANT
ELECT REM PT RETURN 9FT ADLT (ELECTROSURGICAL) ×2
ELECTRODE REM PT RTRN 9FT ADLT (ELECTROSURGICAL) ×1 IMPLANT
GAUZE 4X4 16PLY RFD (DISPOSABLE) IMPLANT
GAUZE SPONGE 4X4 12PLY STRL (GAUZE/BANDAGES/DRESSINGS) IMPLANT
GLOVE BIO SURGEON STRL SZ 6.5 (GLOVE) ×2 IMPLANT
GLOVE BIO SURGEON STRL SZ7.5 (GLOVE) ×2 IMPLANT
GLOVE BIOGEL M 6.5 STRL (GLOVE) ×4 IMPLANT
GLOVE BIOGEL PI IND STRL 6.5 (GLOVE) ×1 IMPLANT
GLOVE BIOGEL PI IND STRL 7.0 (GLOVE) IMPLANT
GLOVE BIOGEL PI IND STRL 7.5 (GLOVE) ×1 IMPLANT
GLOVE BIOGEL PI IND STRL 8 (GLOVE) IMPLANT
GLOVE BIOGEL PI IND STRL 8.5 (GLOVE) IMPLANT
GLOVE BIOGEL PI INDICATOR 6.5 (GLOVE) ×3
GLOVE BIOGEL PI INDICATOR 7.0 (GLOVE) ×1
GLOVE BIOGEL PI INDICATOR 7.5 (GLOVE) ×1
GLOVE BIOGEL PI INDICATOR 8 (GLOVE) ×2
GLOVE BIOGEL PI INDICATOR 8.5 (GLOVE) ×2
GLOVE ECLIPSE 8.5 STRL (GLOVE) ×2 IMPLANT
GLOVE EXAM NITRILE LRG STRL (GLOVE) IMPLANT
GLOVE EXAM NITRILE XL STR (GLOVE) IMPLANT
GLOVE EXAM NITRILE XS STR PU (GLOVE) IMPLANT
GLOVE SURG SS PI 7.0 STRL IVOR (GLOVE) ×2 IMPLANT
GOWN STRL REUS W/ TWL LRG LVL3 (GOWN DISPOSABLE) IMPLANT
GOWN STRL REUS W/ TWL XL LVL3 (GOWN DISPOSABLE) ×1 IMPLANT
GOWN STRL REUS W/TWL 2XL LVL3 (GOWN DISPOSABLE) ×2 IMPLANT
GOWN STRL REUS W/TWL LRG LVL3 (GOWN DISPOSABLE) ×6
GOWN STRL REUS W/TWL XL LVL3 (GOWN DISPOSABLE) ×4
HEMOSTAT POWDER KIT SURGIFOAM (HEMOSTASIS) ×4 IMPLANT
KIT BASIN OR (CUSTOM PROCEDURE TRAY) ×2 IMPLANT
KIT TURNOVER KIT B (KITS) ×2 IMPLANT
NEEDLE HYPO 22GX1.5 SAFETY (NEEDLE) ×2 IMPLANT
NS IRRIG 1000ML POUR BTL (IV SOLUTION) ×2 IMPLANT
PACK LAMINECTOMY NEURO (CUSTOM PROCEDURE TRAY) ×2 IMPLANT
PAD ARMBOARD 7.5X6 YLW CONV (MISCELLANEOUS) ×3 IMPLANT
PATTIES SURGICAL .5 X.5 (GAUZE/BANDAGES/DRESSINGS) ×1 IMPLANT
PATTIES SURGICAL 1X1 (DISPOSABLE) ×1 IMPLANT
PIN MAYFIELD SKULL DISP (PIN) ×2 IMPLANT
PUTTY DBF 3CC CORTICAL FIBERS (Putty) ×1 IMPLANT
ROD PRE CUT 3.5X40MM SPINAL (Rod) ×1 IMPLANT
ROD PRE-CUT 3.5X30 (Rod) ×1 IMPLANT
SCREW MULTI AXIAL 3.5X16MM (Screw) ×2 IMPLANT
SCREW MULTI AXIAL 3.5X22 (Screw) ×1 IMPLANT
SCREW MULTI AXIAL 3.5X26MM (Screw) ×2 IMPLANT
SET SCREW INFINITY IFIX THOR (Screw) ×4 IMPLANT
SPONGE LAP 4X18 RFD (DISPOSABLE) IMPLANT
SPONGE SURGIFOAM ABS GEL SZ50 (HEMOSTASIS) ×1 IMPLANT
STAPLER VISISTAT 35W (STAPLE) ×2 IMPLANT
SUT ETHILON 3 0 FSL (SUTURE) IMPLANT
SUT MNCRL AB 3-0 PS2 18 (SUTURE) ×2 IMPLANT
SUT VIC AB 0 CT1 18XCR BRD8 (SUTURE) ×1 IMPLANT
SUT VIC AB 0 CT1 8-18 (SUTURE) ×2
SUT VIC AB 2-0 CP2 18 (SUTURE) ×2 IMPLANT
TOWEL GREEN STERILE (TOWEL DISPOSABLE) ×2 IMPLANT
TOWEL GREEN STERILE FF (TOWEL DISPOSABLE) ×2 IMPLANT
UNDERPAD 30X36 HEAVY ABSORB (UNDERPADS AND DIAPERS) ×1 IMPLANT
WATER STERILE IRR 1000ML POUR (IV SOLUTION) ×2 IMPLANT

## 2019-12-06 NOTE — H&P (Signed)
Surgical H&P Update  HPI: 79 y.o. woman with severe neck pain after a fall at the beginning of this year, found to have a displaced fracture of C1-2 with instability, here for surgical fixation. No changes in health since she was last seen. Still having severe neck pain and wishes to proceed with surgery.  PMHx:  Past Medical History:  Diagnosis Date  . Anemia   . Arthritis   . GERD (gastroesophageal reflux disease)   . History of blood transfusion   . Hyperlipidemia   . Hypertension   . MS (multiple sclerosis) (HCC)   . Neuromuscular disorder (HCC)    multiple scleroosis/peripheral neuropathy  . PONV (postoperative nausea and vomiting)   . Rheumatoid arthritis (HCC)   . Ulcer   . Vitamin D deficiency    FamHx:  Family History  Problem Relation Age of Onset  . Heart attack Father   . Breast cancer Neg Hx    SocHx:  reports that she has never smoked. She has never used smokeless tobacco. She reports that she does not drink alcohol and does not use drugs.  Physical Exam: AOx3, PERRL, FS, TM  Strength 5/5 x4, SILTx4  Assesment/Plan: 79 y.o. woman with unstable C1-2 fracture, here for posterior cervical instrumented fusion. Risks, benefits, and alternatives discussed and the patient would like to continue with surgery. We also discussed that, depending on her bone quality and screw purchase, I may have to extend the construct caudally to include cervical lateral mass screws as needed.  -OR today -4NP post-op  Jadene Pierini, MD 12/06/19 8:46 AM

## 2019-12-06 NOTE — Brief Op Note (Signed)
12/06/2019  4:19 PM  PATIENT:  Michele Bryan  79 y.o. female  PRE-OPERATIVE DIAGNOSIS:  Closed fracture of cervical spine  POST-OPERATIVE DIAGNOSIS:  Same  PROCEDURE:  Procedure(s): Cervical one-two Posterior instrumented fusion (N/A)  SURGEON:  Surgeon(s) and Role:    * Boysie Bonebrake, Clovis Pu, MD - Primary    Barnett Abu, MD - Assisting  PHYSICIAN ASSISTANT:   ANESTHESIA:   general  EBL:  550 mL   BLOOD ADMINISTERED:none  DRAINS: none   LOCAL MEDICATIONS USED:  LIDOCAINE   SPECIMEN:  No Specimen  DISPOSITION OF SPECIMEN:  N/A  COUNTS:  YES  TOURNIQUET:  * No tourniquets in log *  DICTATION: .Note written in EPIC  PLAN OF CARE: Admit to inpatient   PATIENT DISPOSITION:  PACU - hemodynamically stable.   Delay start of Pharmacological VTE agent (>24hrs) due to surgical blood loss or risk of bleeding: yes

## 2019-12-06 NOTE — Anesthesia Postprocedure Evaluation (Signed)
Anesthesia Post Note  Patient: Michele Bryan  Procedure(s) Performed: Cervical one-two Posterior instrumented fusion (N/A )     Patient location during evaluation: PACU Anesthesia Type: General Level of consciousness: awake and alert and oriented Pain management: pain level controlled Vital Signs Assessment: post-procedure vital signs reviewed and stable Respiratory status: spontaneous breathing, nonlabored ventilation and respiratory function stable Cardiovascular status: blood pressure returned to baseline and stable Postop Assessment: no apparent nausea or vomiting Anesthetic complications: no   No complications documented.  Last Vitals:  Vitals:   12/06/19 1630 12/06/19 1645  BP: (!) 143/79 139/81  Pulse: 72 81  Resp: 17 17  Temp:    SpO2: 100% 100%    Last Pain:  Vitals:   12/06/19 1630  TempSrc:   PainSc: Asleep                 Aunesty Tyson A.

## 2019-12-06 NOTE — Transfer of Care (Signed)
Immediate Anesthesia Transfer of Care Note  Patient: Michele Bryan  Procedure(s) Performed: Cervical one-two Posterior instrumented fusion (N/A )  Patient Location: PACU  Anesthesia Type:General  Level of Consciousness: drowsy  Airway & Oxygen Therapy: Patient Spontanous Breathing and Patient connected to nasal cannula oxygen  Post-op Assessment: Report given to RN, Post -op Vital signs reviewed and stable and Patient moving all extremities  Post vital signs: Reviewed and stable  Last Vitals:  Vitals Value Taken Time  BP 143/79 12/06/19 1626  Temp 36.6 C 12/06/19 1626  Pulse 73 12/06/19 1629  Resp 16 12/06/19 1629  SpO2 100 % 12/06/19 1629  Vitals shown include unvalidated device data.  Last Pain:  Vitals:   12/06/19 0904  TempSrc:   PainSc: 5       Patients Stated Pain Goal: 5 (12/06/19 0904)  Complications: No complications documented.

## 2019-12-06 NOTE — Op Note (Signed)
PATIENT: Michele Bryan  DAY OF SURGERY: 12/06/19   PRE-OPERATIVE DIAGNOSIS:  Unstable C1-2 fracture   POST-OPERATIVE DIAGNOSIS:  Same   PROCEDURE:  C1-C2 posterior instrumented fusion   SURGEON:  Surgeon(s) and Role:    Jadene Pierini, MD - Primary    Barnett Abu, MD - Assisting   ANESTHESIA: ETGA   BRIEF HISTORY: This is a 79 year old woman who presented with severe neck pain after a fall 9 months ago. The patient was found to have a type 2 dens fracture with significant subluxation, but reduction when bearing weight. This was discussed with the patient as well as risks, benefits, and alternatives and wished to proceed with surgery.   OPERATIVE DETAIL: The patient was taken to the operating room and anesthesia was induced by the anesthesia team. They were placed on the OR table in the prone position with padding of all pressure points. A formal time out was performed with two patient identifiers and confirmed the operative site. The operative site was marked, hair was clipped with surgical clippers, the area was then prepped and draped in a sterile fashion. After positioning, fluoroscopy was used to check the patient's alignment. The fracture partially was partially reduced, improved from her alignment on CT and MRI. A midline incision was placed to expose from C1 and C2. Subperiosteal dissection was performed bilaterally and fluoroscopy was again used to confirm the surgical level.   This proved to be fairly difficult given the partial reduction of C1 and how loose the posterior ring of C1 was. The lateral masses were at an increased depth and the instability of the posterior ring complicated dissection. After dissecting free the posterior arch, it was loose enough to be removed and was therefore morselized to use as autograft. Given the increase in depth and difficulty getting good exposure, I performed bilateral C2 ganglionectomies with an improvement in visualization. Fluoroscopy was  then used to guide bilateral C1 lateral mass screws. For C2, the preop CT suggested that the small left pedicle and lateral mass on the left were insufficient for good purchase. I therefore first placed a right C2 pars screw using standard landmarks and fluoroscopy, along with preoperative measurements. A left C2 translaminar screw was then placed in the usual technique. These were connected with rods and final tightened. The C1-2 joint space was decorticated along with the adjacent fusion structures, then morselized autograft as well as added allograft was combined to complete the fusion.   Hemostasis was confirmed, all instrument and sponge counts were correct, the incision was then closed in layers. The patient was then returned to anesthesia for emergence. No apparent complications at the completion of the procedure.   EBL:    DRAINS: none   SPECIMENS: none   Jadene Pierini, MD 12/06/19 11:45 AM

## 2019-12-06 NOTE — Progress Notes (Signed)
Neurosurgery Service Post-operative progress note  Assessment & Plan: 79 y.o. woman s/p C1-2 fixation, seen in PACU, FCx4 with good strength, recovering well.  -admit to [facility] -activity as tolerated, no brace needed, advance diet as tolerated  Jadene Pierini  12/06/19 5:08 PM

## 2019-12-06 NOTE — Anesthesia Procedure Notes (Signed)
Procedure Name: Intubation Date/Time: 12/06/2019 12:14 PM Performed by: Maripat Borba T, CRNA Pre-anesthesia Checklist: Patient identified, Emergency Drugs available, Suction available and Patient being monitored Patient Re-evaluated:Patient Re-evaluated prior to induction Oxygen Delivery Method: Circle system utilized Preoxygenation: Pre-oxygenation with 100% oxygen Induction Type: IV induction Ventilation: Mask ventilation without difficulty and Oral airway inserted - appropriate to patient size Laryngoscope Size: Glidescope and 3 Tube type: Oral Tube size: 7.0 mm Number of attempts: 1 Airway Equipment and Method: Stylet and Oral airway Placement Confirmation: ETT inserted through vocal cords under direct vision,  positive ETCO2 and breath sounds checked- equal and bilateral Secured at: 21 cm Tube secured with: Tape Dental Injury: Teeth and Oropharynx as per pre-operative assessment  Comments: Performed by Randon Goldsmith, SRNA

## 2019-12-07 ENCOUNTER — Encounter (HOSPITAL_COMMUNITY): Payer: Self-pay | Admitting: Neurological Surgery

## 2019-12-07 LAB — TYPE AND SCREEN
ABO/RH(D): O POS
Antibody Screen: NEGATIVE
Unit division: 0

## 2019-12-07 LAB — BPAM RBC
Blood Product Expiration Date: 202111012359
ISSUE DATE / TIME: 202110051507
Unit Type and Rh: 5100

## 2019-12-07 MED ORDER — OXYCODONE-ACETAMINOPHEN 5-325 MG PO TABS: 1.0000 | ORAL_TABLET | Freq: Four times a day (QID) | ORAL | 0 refills | Status: DC | PRN

## 2019-12-07 MED ORDER — FISH OIL 1000 MG PO CAPS: ORAL_CAPSULE | ORAL | 0 refills | Status: DC

## 2019-12-07 MED ORDER — NAPROXEN 500 MG PO TABS: ORAL_TABLET | ORAL | Status: DC

## 2019-12-07 MED FILL — Thrombin For Soln 5000 Unit: CUTANEOUS | Qty: 5000 | Status: AC

## 2019-12-07 NOTE — Plan of Care (Signed)
Patient alert and oriented, mae's well, voiding adequate amount of urine, swallowing without difficulty, no c/o pain at time of discharge. Patient discharged home with family. Script and discharged instructions given to patient. Patient and family stated understanding of instructions given. Patient has an appointment with Dr. Ostergard   

## 2019-12-07 NOTE — Evaluation (Signed)
Occupational Therapy Evaluation Patient Details Name: Michele Bryan MRN: [id number] DOB: 1940-06-25 Today's Date: 12/07/2019    History of Present Illness Pt is a 79 y/o female now s/p C1-2 fusion. PMHx includes anemia, MS, HTN, RA, hx of reverse TSA.    Clinical Impression   This 79 y/o female presents with the above. PTA pt reports being independent with ADL and functional mobility. Pt currently performing room level mobility using SPC and ADL tasks with minguard assist. Pt mostly with limitations due to post op pain at this time. Educated pt re: cervical precautions, safety and compensatory techniques for completing ADL and functional transfers with pt verbalizing and/or return demonstrating understanding throughout. Pt reports plans to return home with family assist. She will benefit from continued acute OT services to maximize her safety and independence with ADL and mobility; do not anticipate she will require follow up OT services after discharge.     Follow Up Recommendations  No OT follow up;Supervision/Assistance - 24 hour    Equipment Recommendations  3 in 1 bedside commode           Precautions / Restrictions Precautions Precautions: Cervical Precaution Booklet Issued: Yes (comment) Precaution Comments: issued and reviewed throughout Required Braces or Orthoses:  ("no brace needed" order) Restrictions Weight Bearing Restrictions: No      Mobility Bed Mobility Overal bed mobility: Needs Assistance Bed Mobility: Rolling;Sidelying to Sit Rolling: Supervision Sidelying to sit: Supervision       General bed mobility comments: VCs for carryover of log roll technique with pt return demonstrating   Transfers Overall transfer level: Needs assistance Equipment used: None;Straight cane Transfers: Sit to/from Stand Sit to Stand: Supervision         General transfer comment: for balance and safety; stood from EOB and toilet     Balance Overall balance assessment:  Needs assistance Sitting-balance support: Feet supported Sitting balance-Leahy Scale: Good     Standing balance support: Single extremity supported;No upper extremity supported;During functional activity Standing balance-Leahy Scale: Fair                             ADL either performed or assessed with clinical judgement   ADL Overall ADL's : Needs assistance/impaired Eating/Feeding: Modified independent;Sitting   Grooming: Min guard;Standing;Oral care;Wash/dry face   Upper Body Bathing: Supervision/ safety;Sitting   Lower Body Bathing: Min guard;Sit to/from stand   Upper Body Dressing : Set up;Sitting   Lower Body Dressing: Min guard;Sit to/from stand Lower Body Dressing Details (indicate cue type and reason): pt able to perform figure 4 for LB ADL Toilet Transfer: Min guard;Ambulation Toilet Transfer Details (indicate cue type and reason): using cane  Toileting- Clothing Manipulation and Hygiene: Min guard;Sit to/from stand       Functional mobility during ADLs: Min guard;Cane                     Pertinent Vitals/Pain Pain Assessment: Faces Faces Pain Scale: Hurts little more Pain Location: incisional Pain Descriptors / Indicators: Discomfort Pain Intervention(s): Monitored during session     Hand Dominance     Extremity/Trunk Assessment Upper Extremity Assessment Upper Extremity Assessment: RUE deficits/detail;LUE deficits/detail;Generalized weakness RUE Deficits / Details: pt with hx of RA notable in digits, but functional  LUE Deficits / Details: pt with hx of RA notable in digits, but functional   Lower Extremity Assessment Lower Extremity Assessment: Defer to PT evaluation   Cervical / Trunk Assessment  Cervical / Trunk Assessment: Kyphotic   Communication Communication Communication: No difficulties   Cognition Arousal/Alertness: Awake/alert Behavior During Therapy: WFL for tasks assessed/performed Overall Cognitive Status: Within  Functional Limits for tasks assessed                                     General Comments       Exercises     Shoulder Instructions      Home Living Family/patient expects to be discharged to:: Private residence Living Arrangements: Spouse/significant other;Children Available Help at Discharge: Family;Available 24 hours/day Type of Home: House Home Access: Stairs to enter [employer] of Steps: 1   Home Layout: Two level;Able to live on main level with bedroom/bathroom     Bathroom Shower/Tub: Walk-in shower;Tub/shower unit   Bathroom Toilet: Standard     Home Equipment: Environmental consultant - 2 wheels;Cane - single point;Shower seat - built in;Grab bars - tub/shower;Grab bars - toilet          Prior Functioning/Environment Level of Independence: Independent        Comments: recently using SPC few days prior to sx, typically independent         OT Problem List: Decreased strength;Decreased range of motion;Decreased activity tolerance;Impaired balance (sitting and/or standing);Decreased coordination;Decreased knowledge of precautions;Decreased knowledge of use of DME or AE;Impaired UE functional use      OT Treatment/Interventions: Self-care/ADL training;Therapeutic exercise;Energy conservation;DME and/or AE instruction;Therapeutic activities;Patient/family education;Balance training    OT Goals(Current goals can be found in the care plan section) Acute Rehab OT Goals Patient Stated Goal: home when able OT Goal Formulation: With patient Time For Goal Achievement: 12/21/19 Potential to Achieve Goals: Good  OT Frequency: Min 2X/week   Barriers to D/C:            Co-evaluation              AM-PAC OT "6 Clicks" Daily Activity     Outcome Measure Help from another person eating meals?: None Help from another person taking care of personal grooming?: A Little Help from another person toileting, which includes using toliet, bedpan, or urinal?:  A Little Help from another person bathing (including washing, rinsing, drying)?: A Little Help from another person to put on and taking off regular upper body clothing?: A Little Help from another person to put on and taking off regular lower body clothing?: A Little 6 Click Score: 19   End of Session Equipment Utilized During Treatment: Gait belt;Other (comment) [facility]) Nurse Communication: Mobility status  Activity Tolerance: Patient tolerated treatment well Patient left: with call bell/phone within reach;Other (comment) (seated EOB)  OT Visit Diagnosis: Other abnormalities of gait and mobility (R26.89);Muscle weakness (generalized) (M62.81)                Time: 3016-0109 OT Time Calculation (min): 25 min Charges:  OT General Charges $OT Visit: 1 Visit OT Evaluation $OT Eval Low Complexity: 1 Low OT Treatments $Self Care/Home Management : 8-22 mins  Marcy Siren, OT Acute Rehabilitation Services Pager [phone] Office [phone]   Orlando Penner 12/07/2019, 9:42 AM

## 2019-12-07 NOTE — Discharge Summary (Signed)
Physician Discharge Summary  Patient ID: Michele Bryan MRN: [id number] DOB/AGE: [birth year] 79 y.o.  Admit date: 12/06/2019 Discharge date: 12/07/2019  Admission Diagnoses: Closed fracuture of cervical spine  Discharge Diagnoses:  Active Problems:   Closed cervical spine fracture [facility])   Discharged Condition: good  Hospital Course: the patient was admitted on 12/06/2019 and taken to the operating room for cervical one two posterior instrumented fusion. The patient tolerated the procedure well. The patient taken to the recovery room and then to the floor in stable condition. The hospital course was routine. There was no complications. The wound remains clean, dry and intact. The patient remained afebrile with stable vital signs, and tolerated a regular diet. The pain is well controlled with oral pain medications. Upper extremities strength-good, she is recovering well. Will discharge home with Percoset, she tolerated the medication well with no nausea last night.    Discharge Exam:  Blood pressure 133/77, pulse 92, temperature 97.9 F (36.6 C), temperature source Oral, resp. rate 18, height 5\' 4"  (1.626 m), weight 49.9 kg, SpO2 99 %. Alert and oriented x 4 CN II-XII grossly intact MAE, Strength and sensation intact Dressing is clean, dry, and intact- covered in dermabond  Disposition: Discharge disposition: 01-Home or Self Care        Allergies as of 12/07/2019      Reactions   Codeine Nausea And Vomiting   Demerol [meperidine] Nausea And Vomiting      Medication List    STOP taking these medications   traMADol 50 MG tablet Commonly known as: ULTRAM     TAKE these medications   acetaminophen 650 MG CR tablet Commonly known as: TYLENOL Take 1,300 mg by mouth every 8 (eight) hours as needed for pain.   alendronate 70 MG tablet Commonly known as: FOSAMAX Take 70 mg by mouth every Tuesday. Take with a full glass of water on an empty stomach.   ciprofloxacin 500 MG  tablet Commonly known as: CIPRO Take 500 mg by mouth 2 (two) times daily. 10 DAYS   Cystex Liqd Take 15 mLs by mouth daily as needed (for UTI).   doxylamine (Sleep) 25 MG tablet Commonly known as: UNISOM Take 25 mg by mouth at bedtime as needed for sleep.   Fish Oil 1000 MG Caps Stop taking it for 5 days, restart on 10/10 What changed:   how to take this  additional instructions   gabapentin 100 MG capsule Commonly known as: NEURONTIN Take 1 capsule (100 mg total) by mouth 3 (three) times daily.   Krill Oil 500 MG Caps Take 500 mg by mouth daily.   losartan 50 MG tablet Commonly known as: COZAAR Take 50 mg by mouth 2 (two) times daily.   metroNIDAZOLE 500 MG tablet Commonly known as: FLAGYL Take 500 mg by mouth 2 (two) times daily. 10 DAYS   multivitamin with minerals Tabs tablet Take 1 tablet by mouth daily.   naproxen 500 MG tablet Commonly known as: NAPROSYN dont restart until 12/11/2019 What changed:   when to take this  reasons to take this  additional instructions   omeprazole 40 MG capsule Commonly known as: PRILOSEC Take 40 mg by mouth daily.   Orencia 125 MG/ML Sosy Generic drug: Abatacept INJECT 125 MG INTO THE SKIN ONCE A WEEK.   Orencia ClickJect 125 MG/ML Soaj Generic drug: Abatacept Inject 125 mg into the skin once a week.   oxyCODONE-acetaminophen 5-325 MG tablet Commonly known as: Percocet Take 1 tablet by mouth every  6 (six) hours as needed for severe pain.   Vitamin B-12 5000 MCG Tbdp Take 5,000 mcg by mouth daily.   Vitamin D 125 MCG (5000 UT) Caps Take 5,000 Units by mouth daily.   vitamin E 180 MG (400 UNITS) capsule Take 400 Units by mouth daily.   zolpidem 10 MG tablet Commonly known as: AMBIEN Take 0.5 tablets (5 mg total) by mouth at bedtime as needed for sleep. What changed:   how much to take  when to take this            Durable Medical Equipment  (From admission, onward)         Start     Ordered    12/07/19 0904  For home use only DME 3 n 1  Once        12/07/19 4970           Signed: Kennon Portela 12/07/2019, 9:13 AM

## 2019-12-07 NOTE — Evaluation (Signed)
Physical Therapy Evaluation Patient Details Name: Michele Bryan MRN: [id number] DOB: May 14, 1940 Today's Date: 12/07/2019   History of Present Illness  Pt is a 79 y/o female now s/p C1-2 fusion. PMHx includes anemia, MS, HTN, RA, hx of reverse TSA.   Clinical Impression  Patient evaluated by Physical Therapy with no further acute PT needs identified. Prior to admission, pt is independent with mobility/ADL's. Pt seemingly fairly close to functional baseline; reports fair control. Ambulating 200 feet with a cane at a min guard assist level. Displays decreased gait speed, dynamic balance deficits and abnormal posture. Would benefit from follow up OPPT to address. All education has been completed and the patient has no further questions. See below for any follow-up Physical Therapy or equipment needs. PT is signing off. Thank you for this referral.     Follow Up Recommendations Outpatient PT;Supervision for mobility/OOB (OPPT for balance training, postural re-education)    Equipment Recommendations  3in1 (PT)    Recommendations for Other Services       Precautions / Restrictions Precautions Precautions: Cervical Precaution Booklet Issued: Yes (comment) Precaution Comments: issued and reviewed throughout Required Braces or Orthoses:  ("no brace needed" order) Restrictions Weight Bearing Restrictions: No      Mobility  Bed Mobility Overal bed mobility: Needs Assistance Bed Mobility: Rolling;Sidelying to Sit Rolling: Supervision Sidelying to sit: Supervision       General bed mobility comments: Sitting EOB upon arrival  Transfers Overall transfer level: Needs assistance Equipment used: Straight cane Transfers: Sit to/from Stand Sit to Stand: Supervision         General transfer comment: for balance and safety; stood from EOB and toilet   Ambulation/Gait Ambulation/Gait assistance: Min guard Gait Distance (Feet): 200 Feet Assistive device: Straight cane Gait  Pattern/deviations: Step-through pattern;Trunk flexed;Decreased stride length;Narrow base of support Gait velocity: decreased Gait velocity interpretation: <1.8 ft/sec, indicate of risk for recurrent falls General Gait Details: Cues for segmental turning, use of cane, min guard for safety  Stairs            Wheelchair Mobility    Modified Rankin (Stroke Patients Only)       Balance Overall balance assessment: Needs assistance Sitting-balance support: Feet supported Sitting balance-Leahy Scale: Good     Standing balance support: Single extremity supported;No upper extremity supported;During functional activity Standing balance-Leahy Scale: Fair                               Pertinent Vitals/Pain Pain Assessment: Faces Faces Pain Scale: Hurts little more Pain Location: incisional Pain Descriptors / Indicators: Discomfort Pain Intervention(s): Monitored during session    Home Living Family/patient expects to be discharged to:: Private residence Living Arrangements: Spouse/significant other;Children Available Help at Discharge: Family;Available 24 hours/day Type of Home: House Home Access: Stairs to enter   [employer] of Steps: 1 Home Layout: Two level;Able to live on main level with bedroom/bathroom Home Equipment: Dan Humphreys - 2 wheels;Cane - single point;Shower seat - built in;Grab bars - tub/shower;Grab bars - toilet      Prior Function Level of Independence: Independent         Comments: recently using SPC few days prior to sx, typically independent      Hand Dominance        Extremity/Trunk Assessment   Upper Extremity Assessment Upper Extremity Assessment: RUE deficits/detail;LUE deficits/detail RUE Deficits / Details: pt with hx of RA notable in digits, but functional  LUE  Deficits / Details: pt with hx of RA notable in digits, but functional    Lower Extremity Assessment Lower Extremity Assessment: RLE deficits/detail;LLE  deficits/detail RLE Deficits / Details: Strength 5/5 LLE Deficits / Details: Strength 5/5    Cervical / Trunk Assessment Cervical / Trunk Assessment: Kyphotic  Communication   Communication: No difficulties  Cognition Arousal/Alertness: Awake/alert Behavior During Therapy: WFL for tasks assessed/performed Overall Cognitive Status: Within Functional Limits for tasks assessed                                        General Comments      Exercises     Assessment/Plan    PT Assessment Patent does not need any further PT services  PT Problem List         PT Treatment Interventions      PT Goals (Current goals can be found in the Care Plan section)  Acute Rehab PT Goals Patient Stated Goal: home when able PT Goal Formulation: All assessment and education complete, DC therapy    Frequency     Barriers to discharge        Co-evaluation               AM-PAC PT "6 Clicks" Mobility  Outcome Measure Help needed turning from your back to your side while in a flat bed without using bedrails?: None Help needed moving from lying on your back to sitting on the side of a flat bed without using bedrails?: None Help needed moving to and from a bed to a chair (including a wheelchair)?: None Help needed standing up from a chair using your arms (e.g., wheelchair or bedside chair)?: None Help needed to walk in hospital room?: A Little Help needed climbing 3-5 steps with a railing? : A Little 6 Click Score: 22    End of Session   Activity Tolerance: Patient tolerated treatment well Patient left: in bed;with call bell/phone within reach Nurse Communication: Mobility status PT Visit Diagnosis: Pain;Unsteadiness on feet (R26.81) Pain - part of body:  (cervical)    Time: 9937-1696 PT Time Calculation (min) (ACUTE ONLY): 17 min   Charges:   PT Evaluation $PT Eval Low Complexity: 1 Low          Lillia Pauls, PT, DPT Acute Rehabilitation  Services Pager [phone] Office [phone]   Norval Morton 12/07/2019, 11:23 AM

## 2019-12-07 NOTE — Discharge Instructions (Addendum)
Discharge Instructions slowly increase your activity back to normal as tolerated.  Your incision is closed with dermabond (purple glue). This will naturally fall off over the next 1-2 weeks.  Okay to shower on the day of discharge. Use regular soap and water and try to be gentle when cleaning your incision.   Call Your Doctor If Any of These Occur Redness, drainage, or swelling at the wound.  Temperature greater than 101 degrees. Severe pain not relieved by pain medication. Increased difficulty swallowing.  Incision starts to come apart.   Follow up with Dr. Maurice Small in 2-3 weeks after discharge. If you do not already have a discharge appointment, please call his office at [phone] to schedule a follow up appointment. If you have any concerns or questions, please call the office and let us know.

## 2019-12-17 ENCOUNTER — Other Ambulatory Visit: Payer: Self-pay | Admitting: Neurology

## 2019-12-20 NOTE — Progress Notes (Signed)
Office Visit Note  Patient: Michele Bryan             Date of Birth: [birth year]           MRN: [id number]             PCP: Sigmund Hazel, MD Referring: Sigmund Hazel, MD Visit Date: 01/03/2020 Occupation: @GUAROCC @  Subjective:  Medication Management   History of Present Illness: Michele Bryan is a 79 y.o. female with history of seropositive rheumatoid arthritis and osteoarthritis.  She states she fell in January and fractured her cervical spine.  It did not heal and took a long time.  She states she underwent surgery in October for the cervical fusion.  She states she had C1 and C2 fracture.  She had to come off Orencia at the time of surgery and then she restarted.  Her rheumatoid arthritis is well controlled.  She has off-and-on discomfort in her knee joints but not on a regular basis.  Activities of Daily Living:  Patient reports morning stiffness for 6-8 hours.   Patient Reports nocturnal pain.  Difficulty dressing/grooming: Denies Difficulty climbing stairs: Denies Difficulty getting out of chair: Denies Difficulty using hands for taps, buttons, cutlery, and/or writing: Reports  Review of Systems  Constitutional: Positive for fatigue.  HENT: Negative for mouth sores, mouth dryness and nose dryness.   Eyes: Negative for pain, itching, visual disturbance and dryness.  Respiratory: Negative for cough, hemoptysis, shortness of breath and difficulty breathing.   Cardiovascular: Negative for chest pain, palpitations and swelling in legs/feet.  Gastrointestinal: Positive for constipation. Negative for abdominal pain, blood in stool and diarrhea.  Endocrine: Negative for increased urination.  Genitourinary: Negative for painful urination.  Musculoskeletal: Positive for arthralgias, joint pain and morning stiffness. Negative for joint swelling, myalgias, muscle weakness, muscle tenderness and myalgias.  Skin: Negative for color change, rash and redness.  Allergic/Immunologic:  Negative for susceptible to infections.  Neurological: Negative for dizziness, numbness, headaches and memory loss.  Hematological: Negative for swollen glands.  Psychiatric/Behavioral: Positive for sleep disturbance. Negative for confusion.    PMFS History:  Patient Active Problem List   Diagnosis Date Noted  . Closed cervical spine fracture (HCC) 12/06/2019  . Scapula fracture 03/05/2018  . Chronic left shoulder pain 11/25/2017  . Left cervical radiculopathy 09/29/2017  . 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 High risk medication use 05/06/2016  . Primary osteoarthritis of both hands 05/06/2016  . Primary osteoarthritis of left knee 05/06/2016  . Primary osteoarthritis of both feet 05/06/2016  . Dyslipidemia 05/06/2016  . Peripheral neuropathy 05/06/2016  . Diverticulosis of intestine without bleeding 05/06/2016  . Osteopenia  05/06/2016  . Vitamin D deficiency 05/06/2016  . 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 History of GI bleed 05/06/2016  . Cataract of both eyes 05/06/2016  . Urinary urgency 04/30/2015  . Chronic constipation 04/30/2015  . Multiple sclerosis (HCC) 03/23/2013  . Expected blood loss anemia 06/16/2012  . 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 Anemia   . Arthritis   . GERD (gastroesophageal reflux disease)   . History of blood transfusion   . Hyperlipidemia   . Hypertension   . MS (multiple sclerosis) (HCC)   . Neuromuscular disorder (HCC)    multiple scleroosis/peripheral neuropathy  . PONV (postoperative nausea and vomiting)   . Rheumatoid arthritis (HCC)   .  Ulcer   . Vitamin D deficiency     Family History  Problem Relation Age of Onset  . Heart attack Father   . Breast cancer Neg Hx    Past Surgical History:  Procedure Laterality Date  . AUGMENTATION MAMMAPLASTY    . breast  augumentation    . BREAST SURGERY     biopsy  . COLONOSCOPY    . DILATION AND CURETTAGE OF UTERUS    . EYE SURGERY     both eys,cataracts  . goiter    . KNEE ARTHROSCOPY Right   . ORIF SHOULDER FRACTURE Left 03/05/2018   Procedure: OPEN REDUCTION INTERNAL FIXATION (ORIF) LEFT SCAPULA;  Surgeon: Beverely Low, MD;  Location: [facility] OR;  Service: Orthopedics;  Laterality: Left;  . POSTERIOR CERVICAL FUSION/FORAMINOTOMY N/A 12/06/2019   Procedure: Cervical one-two Posterior instrumented fusion;  Surgeon: Jadene Pierini, MD;  Location: [facility] OR;  Service: Neurosurgery;  Laterality: N/A;  . REVERSE SHOULDER ARTHROPLASTY Left 05/15/2017   Procedure: LEFT REVERSE SHOULDER ARTHROPLASTY;  Surgeon: Beverely Low, MD;  Location: [facility] OR;  Service: Orthopedics;  Laterality: Left;  . REVERSE SHOULDER ARTHROPLASTY Left 10/11/2018   Procedure: left shoulder irrigation and debridement, open poly exchange and removal of painful hardware;  Surgeon: Beverely Low, MD;  Location: [facility] ORS;  Service: Orthopedics;  Laterality: Left;  . THYROID LOBECTOMY    . TOTAL KNEE ARTHROPLASTY Right 06/15/2012   Procedure: RIGHT TOTAL KNEE ARTHROPLASTY;  Surgeon: [name], MD;  Location: [facility] ORS;  Service: Orthopedics;  Laterality: Right;  . WRIST SURGERY Left    Social History   Social History Narrative   Patient lives at home with her husband [name]).   Retired.   Education- college.   Caffeine- Two cups of decaf tea daily.   Right handed.         Immunization History  Administered Date(s) Administered  . Influenza, High Dose Seasonal PF 12/03/2013  . Influenza,inj,quad, With Preservative 11/23/2014  . Influenza-Unspecified 12/19/2017  . PFIZER SARS-COV-2 Vaccination 04/04/2019, 05/02/2019  . Td 12/15/2017  . Zoster Recombinat (Shingrix) 08/10/2017, 01/05/2018     Objective: Vital Signs: BP (!) 159/84 (BP Location: Right Arm, Patient Position: Sitting, Cuff Size: Small)   Pulse (!) 101   Ht 5\' 5"  (1.651 m)    Wt 106 lb 6.4 oz (48.3 kg)   BMI 17.71 kg/m    Physical Exam Vitals and nursing note reviewed.  Constitutional:      Appearance: She is well-developed.  HENT:     Head: Normocephalic and atraumatic.  Eyes:     Conjunctiva/sclera: Conjunctivae normal.  Cardiovascular:     Rate and Rhythm: Normal rate and regular rhythm.     Heart sounds: Normal heart sounds.  Pulmonary:     Effort: Pulmonary effort is normal.     Breath sounds: Normal breath sounds.  Abdominal:     General: Bowel sounds are normal.     Palpations: Abdomen is soft.  Musculoskeletal:     Cervical back: Normal range of motion.  Lymphadenopathy:     Cervical: No cervical adenopathy.  Skin:    General: Skin is warm and dry.     Capillary Refill: Capillary refill takes less than 2 seconds.  Neurological:     Mental Status: She is alert and oriented to person, place, and time.  Psychiatric:        Behavior: Behavior normal.      Musculoskeletal Exam: She is in a neck brace with limited range  of motion.  Left shoulder joint abduction was limited to about 30 degrees without pain.  Left shoulder joint is replaced.  Elbow joints with good range of motion.  She has bilateral CMC subluxation PIP and DIP thickening.  No synovitis was noted over MCPs.  Right knee joint is replaced.  She had good range of motion bilateral knee joints.  There was no tenderness over ankles or MTPs.  CDAI Exam: CDAI Score: 0.8  Patient Global: 4 mm; Provider Global: 4 mm Swollen: 0 ; Tender: 0  Joint Exam 01/03/2020   No joint exam has been documented for this visit   There is currently no information documented on the homunculus. Go to the Rheumatology activity and complete the homunculus joint exam.  Investigation: No additional findings.  Imaging: DG Cervical Spine 2-3 Views  Result Date: 12/06/2019 CLINICAL DATA:  Posterior C1-2 fusion. EXAM: DG C-ARM 1-60 MIN; CERVICAL SPINE - 2-3 VIEW FLUOROSCOPY TIME:  Fluoroscopy Time:  1  minutes 5 seconds Radiation Exposure Index (if provided by the fluoroscopic device): 5.11 mGy Number of Acquired Spot Images: 3 COMPARISON:  CT 11/30/2019 FINDINGS: Three fluoroscopic spot views of the cervical spine obtained in the operating room in frontal and lateral projections. Posterior fusion C1-C2. Portions of the exam are obscured by overlying dental hardware. IMPRESSION: Fluoroscopic spot views after C1-C2 fusion. Electronically Signed   By: Narda Rutherford M.D.   On: 12/06/2019 17:46   DG C-Arm 1-60 Min  Result Date: 12/06/2019 CLINICAL DATA:  Posterior C1-2 fusion. EXAM: DG C-ARM 1-60 MIN; CERVICAL SPINE - 2-3 VIEW FLUOROSCOPY TIME:  Fluoroscopy Time:  1 minutes 5 seconds Radiation Exposure Index (if provided by the fluoroscopic device): 5.11 mGy Number of Acquired Spot Images: 3 COMPARISON:  CT 11/30/2019 FINDINGS: Three fluoroscopic spot views of the cervical spine obtained in the operating room in frontal and lateral projections. Posterior fusion C1-C2. Portions of the exam are obscured by overlying dental hardware. IMPRESSION: Fluoroscopic spot views after C1-C2 fusion. Electronically Signed   By: Narda Rutherford M.D.   On: 12/06/2019 17:46    Recent Labs: Lab Results  Component Value Date   WBC 7.9 12/02/2019   HGB 11.7 (L) 12/02/2019   PLT 306 12/02/2019   NA 135 12/02/2019   K 4.7 12/02/2019   CL 100 12/02/2019   CO2 26 12/02/2019   GLUCOSE 100 (H) 12/02/2019   BUN 17 12/02/2019   CREATININE 0.67 12/02/2019   BILITOT 1.0 08/04/2019   ALKPHOS 52 10/09/2016   AST 12 08/04/2019   ALT 8 08/04/2019   PROT 6.5 08/04/2019   ALBUMIN 4.5 10/09/2016   CALCIUM 9.5 12/02/2019   GFRAA >60 12/02/2019   QFTBGOLDPLUS NEGATIVE 11/04/2018    Speciality Comments: PLQ Eye Exam: 07/30/17 WNL @ [facility] Osteoporosis managed by PCP-ACY 10/12/2018  Procedures:  No procedures performed Allergies: Codeine and Demerol [meperidine]   Assessment / Plan:     Visit Diagnoses:  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.  Her rheumatoid arthritis seems to be very well controlled.  She was off Orencia for surgery and October and then resumed the medication.  High risk medication use - Orencia 125 mg subq injection once weekly started on 12/08/17.  (Plaquenil was discontinued).  She discontinued Methotrexate due to experiencing increased SOB - Plan: QuantiFERON-TB Gold Plus today.  Her labs in October were stable.  She will continue to get labs every 3 months.  Primary osteoarthritis of both hands-she has severe osteoarthritis  involving CMC's PIPs and DIPs.  H/O total shoulder replacement, left-she has limited range of motion of her left shoulder joint.  She had surgery by Dr. Devonne Doughty in the past.  Status post right knee replacement-doing well.  Primary osteoarthritis of left knee-she has some discomfort in her knees off and on.  Primary osteoarthritis of both feet-proper fitting shoes were discussed.  Closed fracture of cervical vertebra, unspecified cervical vertebral level, sequela-she fell and fractured her cervical spine.  She had surgery in October.  Osteopenia of multiple sites - DEXA on 02/22/18: The BMD measured at [facility] Total Right is 0.742 g/cm2 with a T-score of -2.1.  She is taking Fosamax 70 mg 1 tablet by mouth once weekly.  I discussed the option of Forteo due to recent vertebral fracture.  She declined.  She wants to continue Fosamax.  Other medical problems are listed as follows:  Postural kyphosis of thoracic region  History of diverticulosis  History of cataract  History of GI bleed  History of multiple sclerosis (HCC)  Dyslipidemia  History of peripheral neuropathy  History of vitamin D deficiency  Educated about COVID-19 virus infection-patient is fully vaccinated against COVID-19.  She needs a booster.  Instructions were placed in the AVS.  Use of mask, social distancing and hand hygiene  was discussed.  Use of monoclonal antibodies were discussed in case she develops COVID-19 infection.  Orders: Orders Placed This Encounter  Procedures  . QuantiFERON-TB Gold Plus   No orders of the defined types were placed in this encounter.   Follow-Up Instructions: Return in about 3 months (around 04/04/2020) for Rheumatoid arthritis, Osteoarthritis.   Pollyann Savoy, MD  Note - This record has been created using Animal nutritionist.  Chart creation errors have been sought, but may not always  have been located. Such creation errors do not reflect on  the standard of medical care.

## 2019-12-22 DIAGNOSIS — S129XXA Fracture of neck, unspecified, initial encounter: Secondary | ICD-10-CM | POA: Diagnosis not present

## 2019-12-26 ENCOUNTER — Other Ambulatory Visit: Payer: Self-pay | Admitting: Physician Assistant

## 2019-12-26 NOTE — Telephone Encounter (Signed)
Last Visit: 08/04/2019 Next Visit: 01/03/2020 Labs: 12/02/2019 RBC 3.85, Hgb 11.7, Glucose 100 TB Gold: 11/04/2018 Neg   Current Dose per office note on 08/04/2019:Orencia 125 mg subq injection once weekly AP:OLIDCVUDTH arthritis  Patient advised she is due to update her TB Gold. Patient states she will update at her appointment on 01/03/2020.   Okay to refill Orencia?

## 2020-01-02 MED FILL — ORENCIA 125 MG/ML SYRINGE: 125 | 28 days supply | Qty: 4 | Fill #0

## 2020-01-03 ENCOUNTER — Other Ambulatory Visit: Payer: Self-pay

## 2020-01-03 ENCOUNTER — Ambulatory Visit: Payer: Medicare PPO | Admitting: Rheumatology

## 2020-01-03 ENCOUNTER — Encounter: Payer: Self-pay | Admitting: Rheumatology

## 2020-01-03 VITALS — BP 159/84 | HR 101 | Ht 65.0 in | Wt 106.4 lb

## 2020-01-03 DIAGNOSIS — S129XXS Fracture of neck, unspecified, sequela: Secondary | ICD-10-CM

## 2020-01-03 DIAGNOSIS — M1712 Unilateral primary osteoarthritis, left knee: Secondary | ICD-10-CM

## 2020-01-03 DIAGNOSIS — Z79899 Other long term (current) drug therapy: Secondary | ICD-10-CM

## 2020-01-03 DIAGNOSIS — M8589 Other specified disorders of bone density and structure, multiple sites: Secondary | ICD-10-CM

## 2020-01-03 DIAGNOSIS — Z96612 Presence of left artificial shoulder joint: Secondary | ICD-10-CM

## 2020-01-03 DIAGNOSIS — Z7189 Other specified counseling: Secondary | ICD-10-CM

## 2020-01-03 DIAGNOSIS — M19041 Primary osteoarthritis, right hand: Secondary | ICD-10-CM | POA: Diagnosis not present

## 2020-01-03 DIAGNOSIS — Z8669 Personal history of other diseases of the nervous system and sense organs: Secondary | ICD-10-CM

## 2020-01-03 DIAGNOSIS — M19071 Primary osteoarthritis, right ankle and foot: Secondary | ICD-10-CM | POA: Diagnosis not present

## 2020-01-03 DIAGNOSIS — E785 Hyperlipidemia, unspecified: Secondary | ICD-10-CM

## 2020-01-03 DIAGNOSIS — Z8719 Personal history of other diseases of the digestive system: Secondary | ICD-10-CM

## 2020-01-03 DIAGNOSIS — M0579 Rheumatoid arthritis with rheumatoid factor of multiple sites without organ or systems involvement: Secondary | ICD-10-CM

## 2020-01-03 DIAGNOSIS — M19072 Primary osteoarthritis, left ankle and foot: Secondary | ICD-10-CM

## 2020-01-03 DIAGNOSIS — M19042 Primary osteoarthritis, left hand: Secondary | ICD-10-CM

## 2020-01-03 DIAGNOSIS — M4004 Postural kyphosis, thoracic region: Secondary | ICD-10-CM

## 2020-01-03 DIAGNOSIS — Z8639 Personal history of other endocrine, nutritional and metabolic disease: Secondary | ICD-10-CM

## 2020-01-03 DIAGNOSIS — Z96651 Presence of right artificial knee joint: Secondary | ICD-10-CM

## 2020-01-03 DIAGNOSIS — G35 Multiple sclerosis: Secondary | ICD-10-CM

## 2020-01-03 NOTE — Patient Instructions (Addendum)
  COVID-19 vaccine recommendations:   COVID-19 vaccine is recommended for everyone (unless you are allergic to a vaccine component), even if you are on a medication that suppresses your immune system.    If you are on Orencia subcutaneous injection - hold medication one week prior to and one week after the first COVID-19 vaccine dose (only).   Do not take Tylenol or any anti-inflammatory medications (NSAIDs) 24 hours prior to the COVID-19 vaccination.   There is no direct evidence about the efficacy of the COVID-19 vaccine in individuals who are on medications that suppress the immune system.   Even if you are fully vaccinated, and you are on any medications that suppress your immune system, please continue to wear a mask, maintain at least six feet social distance and practice hand hygiene.   If you develop a COVID-19 infection, please contact your PCP or our office to determine if you need monoclonal antibody infusion.  The booster vaccine is now available for immunocompromised patients.   Please see the following web sites for updated information.   [email or web address]   Standing Labs We placed an order today for your standing lab work.   Please have your standing labs drawn in January and every 3 months  If possible, please have your labs drawn 2 weeks prior to your appointment so that the provider can discuss your results at your appointment.  We have open lab daily Monday through Thursday from 8:30-12:30 PM and 1:30-4:30 PM and Friday from 8:30-12:30 PM and 1:30-4:00 PM at the office of Dr. Pollyann Savoy, [facility].   Please be advised, patients with office appointments requiring lab work will take precedents over walk-in lab work.  If possible, please come for your lab work on Monday and Friday afternoons, as you may experience shorter wait times. The office is located at [address], [address], [place], [place] [address] No appointment is necessary.   Labs are drawn by [facility]. Please bring your co-pay at the time of your lab draw.  You may receive a bill from [facility] for your lab work.  If you wish to have your labs drawn at another location, please call the office 24 hours in advance to send orders.  If you have any questions regarding directions or hours of operation,  please call [phone].   As a reminder, please drink plenty of water prior to coming for your lab work. Thanks!

## 2020-01-05 LAB — QUANTIFERON-TB GOLD PLUS
Mitogen-NIL: >10 IU/mL
NIL: 0.03 IU/mL
QuantiFERON-TB Gold Plus: NEGATIVE
TB1-NIL: 0 IU/mL
TB2-NIL: 0 IU/mL

## 2020-01-06 NOTE — Progress Notes (Signed)
TB Gold is negative.

## 2020-01-13 ENCOUNTER — Other Ambulatory Visit: Payer: Self-pay | Admitting: Neurology

## 2020-01-13 DIAGNOSIS — G35 Multiple sclerosis: Secondary | ICD-10-CM | POA: Diagnosis not present

## 2020-01-13 DIAGNOSIS — Z0000 Encounter for general adult medical examination without abnormal findings: Secondary | ICD-10-CM | POA: Diagnosis not present

## 2020-01-13 DIAGNOSIS — H6123 Impacted cerumen, bilateral: Secondary | ICD-10-CM | POA: Diagnosis not present

## 2020-01-13 DIAGNOSIS — I10 Essential (primary) hypertension: Secondary | ICD-10-CM | POA: Diagnosis not present

## 2020-01-13 DIAGNOSIS — D649 Anemia, unspecified: Secondary | ICD-10-CM | POA: Diagnosis not present

## 2020-01-13 DIAGNOSIS — D510 Vitamin B12 deficiency anemia due to intrinsic factor deficiency: Secondary | ICD-10-CM | POA: Diagnosis not present

## 2020-01-13 DIAGNOSIS — K219 Gastro-esophageal reflux disease without esophagitis: Secondary | ICD-10-CM | POA: Diagnosis not present

## 2020-01-13 DIAGNOSIS — M810 Age-related osteoporosis without current pathological fracture: Secondary | ICD-10-CM | POA: Diagnosis not present

## 2020-01-13 DIAGNOSIS — M069 Rheumatoid arthritis, unspecified: Secondary | ICD-10-CM | POA: Diagnosis not present

## 2020-01-13 DIAGNOSIS — Z681 Body mass index (BMI) 19 or less, adult: Secondary | ICD-10-CM | POA: Diagnosis not present

## 2020-01-13 DIAGNOSIS — E559 Vitamin D deficiency, unspecified: Secondary | ICD-10-CM | POA: Diagnosis not present

## 2020-01-13 DIAGNOSIS — G4700 Insomnia, unspecified: Secondary | ICD-10-CM | POA: Diagnosis not present

## 2020-01-17 DIAGNOSIS — S00412A Abrasion of left ear, initial encounter: Secondary | ICD-10-CM | POA: Diagnosis not present

## 2020-01-17 DIAGNOSIS — H6122 Impacted cerumen, left ear: Secondary | ICD-10-CM | POA: Diagnosis not present

## 2020-01-19 DIAGNOSIS — S129XXA Fracture of neck, unspecified, initial encounter: Secondary | ICD-10-CM | POA: Diagnosis not present

## 2020-01-20 ENCOUNTER — Other Ambulatory Visit: Payer: Self-pay | Admitting: Neurological Surgery

## 2020-01-20 ENCOUNTER — Other Ambulatory Visit (HOSPITAL_COMMUNITY): Payer: Self-pay | Admitting: Neurological Surgery

## 2020-01-20 DIAGNOSIS — S129XXA Fracture of neck, unspecified, initial encounter: Secondary | ICD-10-CM

## 2020-01-23 ENCOUNTER — Other Ambulatory Visit: Payer: Self-pay | Admitting: Physician Assistant

## 2020-01-23 NOTE — Telephone Encounter (Signed)
Last Visit: 01/03/2020 Next Visit: 04/03/2020 Labs: 12/02/2019 RBC 3.85, Hgb 11.7, Glucose 100 Tb Gold: 01/03/2020 Neg   Current Dose per office note on 01/03/2020:Orencia 125 mg subq injection once weekly  Dx: Rheumatoid arthritis involving multiple sites with positive rheumatoid factor   Okay to refill Orencia?

## 2020-01-25 ENCOUNTER — Other Ambulatory Visit: Payer: Self-pay

## 2020-01-25 ENCOUNTER — Ambulatory Visit (HOSPITAL_BASED_OUTPATIENT_CLINIC_OR_DEPARTMENT_OTHER)
Admission: RE | Admit: 2020-01-25 | Discharge: 2020-01-25 | Disposition: A | Discharge status: Home / Self Care | Payer: Medicare PPO | Source: Ambulatory Visit | Attending: Neurological Surgery | Admitting: Neurological Surgery

## 2020-01-25 DIAGNOSIS — M542 Cervicalgia: Secondary | ICD-10-CM | POA: Diagnosis not present

## 2020-01-25 DIAGNOSIS — S129XXA Fracture of neck, unspecified, initial encounter: Secondary | ICD-10-CM | POA: Diagnosis not present

## 2020-02-01 MED FILL — ORENCIA 125 MG/ML SYRINGE: 125 | 28 days supply | Qty: 4 | Fill #0

## 2020-02-02 ENCOUNTER — Other Ambulatory Visit: Payer: Self-pay | Admitting: Neurological Surgery

## 2020-02-07 ENCOUNTER — Telehealth: Payer: Self-pay

## 2020-02-07 NOTE — Telephone Encounter (Signed)
I would recommend stopping Orencia 2 weeks before before the surgery and restart 2 weeks after the surgery if cleared by the surgeon.

## 2020-02-07 NOTE — Telephone Encounter (Signed)
Patient called stating she is having revision surgery on 02/13/20 and is checking if she should stop taking Orencia the week before and the week after her surgery.  Patient requested a return call.

## 2020-02-07 NOTE — Telephone Encounter (Signed)
Patient advised [name] would recommend stopping Orencia 2 weeks before before the surgery and restart 2 weeks after the surgery if cleared by the surgeon. Patient expressed understanding.

## 2020-02-09 ENCOUNTER — Other Ambulatory Visit: Payer: Self-pay

## 2020-02-09 ENCOUNTER — Encounter (HOSPITAL_COMMUNITY): Payer: Self-pay

## 2020-02-09 ENCOUNTER — Other Ambulatory Visit (HOSPITAL_COMMUNITY)
Admission: RE | Admit: 2020-02-09 | Discharge: 2020-02-09 | Disposition: A | Discharge status: Home / Self Care | Payer: Medicare PPO | Source: Ambulatory Visit | Attending: Neurological Surgery | Admitting: Neurological Surgery

## 2020-02-09 ENCOUNTER — Encounter (HOSPITAL_COMMUNITY)
Admission: RE | Admit: 2020-02-09 | Discharge: 2020-02-09 | Disposition: A | Discharge status: Home / Self Care | Payer: Medicare PPO | Source: Ambulatory Visit | Attending: Neurological Surgery | Admitting: Neurological Surgery

## 2020-02-09 DIAGNOSIS — Z20822 Contact with and (suspected) exposure to covid-19: Secondary | ICD-10-CM | POA: Insufficient documentation

## 2020-02-09 DIAGNOSIS — Z79899 Other long term (current) drug therapy: Secondary | ICD-10-CM | POA: Diagnosis not present

## 2020-02-09 DIAGNOSIS — W19XXXA Unspecified fall, initial encounter: Secondary | ICD-10-CM | POA: Diagnosis not present

## 2020-02-09 DIAGNOSIS — I00 Rheumatic fever without heart involvement: Secondary | ICD-10-CM | POA: Diagnosis not present

## 2020-02-09 DIAGNOSIS — K219 Gastro-esophageal reflux disease without esophagitis: Secondary | ICD-10-CM | POA: Diagnosis not present

## 2020-02-09 DIAGNOSIS — Z9882 Breast implant status: Secondary | ICD-10-CM | POA: Diagnosis not present

## 2020-02-09 DIAGNOSIS — S12100A Unspecified displaced fracture of second cervical vertebra, initial encounter for closed fracture: Secondary | ICD-10-CM | POA: Diagnosis not present

## 2020-02-09 DIAGNOSIS — Z7983 Long term (current) use of bisphosphonates: Secondary | ICD-10-CM | POA: Diagnosis not present

## 2020-02-09 DIAGNOSIS — Z01812 Encounter for preprocedural laboratory examination: Secondary | ICD-10-CM | POA: Insufficient documentation

## 2020-02-09 DIAGNOSIS — Z96651 Presence of right artificial knee joint: Secondary | ICD-10-CM | POA: Diagnosis not present

## 2020-02-09 DIAGNOSIS — G629 Polyneuropathy, unspecified: Secondary | ICD-10-CM | POA: Diagnosis not present

## 2020-02-09 DIAGNOSIS — Z79891 Long term (current) use of opiate analgesic: Secondary | ICD-10-CM | POA: Diagnosis not present

## 2020-02-09 DIAGNOSIS — Z96612 Presence of left artificial shoulder joint: Secondary | ICD-10-CM | POA: Diagnosis not present

## 2020-02-09 DIAGNOSIS — E890 Postprocedural hypothyroidism: Secondary | ICD-10-CM | POA: Insufficient documentation

## 2020-02-09 DIAGNOSIS — E785 Hyperlipidemia, unspecified: Secondary | ICD-10-CM | POA: Diagnosis not present

## 2020-02-09 DIAGNOSIS — D649 Anemia, unspecified: Secondary | ICD-10-CM | POA: Insufficient documentation

## 2020-02-09 DIAGNOSIS — S12000A Unspecified displaced fracture of first cervical vertebra, initial encounter for closed fracture: Secondary | ICD-10-CM | POA: Insufficient documentation

## 2020-02-09 DIAGNOSIS — I10 Essential (primary) hypertension: Secondary | ICD-10-CM | POA: Diagnosis not present

## 2020-02-09 DIAGNOSIS — G35 Multiple sclerosis: Secondary | ICD-10-CM | POA: Diagnosis not present

## 2020-02-09 LAB — CBC
HCT: 31.8 % — ABNORMAL LOW (ref 36.0–46.0)
Hemoglobin: 9.8 g/dL — ABNORMAL LOW (ref 12.0–15.0)
MCH: 27.9 pg (ref 26.0–34.0)
MCHC: 30.8 g/dL (ref 30.0–36.0)
MCV: 90.6 fL (ref 80.0–100.0)
Platelets: 350 10*3/uL (ref 150–400)
RBC: 3.51 MIL/uL — ABNORMAL LOW (ref 3.87–5.11)
RDW: 13.4 % (ref 11.5–15.5)
WBC: 9.2 10*3/uL (ref 4.0–10.5)
nRBC: 0 % (ref 0.0–0.2)

## 2020-02-09 LAB — BASIC METABOLIC PANEL
Anion gap: 10 (ref 5–15)
BUN: 16 mg/dL (ref 8–23)
CO2: 24 mmol/L (ref 22–32)
Calcium: 9.3 mg/dL (ref 8.9–10.3)
Chloride: 105 mmol/L (ref 98–111)
Creatinine, Ser: 0.89 mg/dL (ref 0.44–1.00)
GFR, Estimated: >60 mL/min (ref >60)
Glucose, Bld: 93 mg/dL (ref 70–99)
Potassium: 4.6 mmol/L (ref 3.5–5.1)
Sodium: 139 mmol/L (ref 135–145)

## 2020-02-09 LAB — TYPE AND SCREEN
ABO/RH(D): O POS
Antibody Screen: NEGATIVE

## 2020-02-09 LAB — SURGICAL PCR SCREEN
MRSA, PCR: NEGATIVE
Staphylococcus aureus: NEGATIVE

## 2020-02-09 LAB — SARS CORONAVIRUS 2 (TAT 6-24 HRS): SARS Coronavirus 2: NEGATIVE

## 2020-02-09 MED ORDER — CHLORHEXIDINE GLUCONATE CLOTH 2 % EX PADS: 6.0000 | MEDICATED_PAD | Freq: Once | CUTANEOUS | Status: DC

## 2020-02-09 NOTE — Progress Notes (Signed)
PCP - DR L MILLER       MEDICAL CLEARANCE DONE OCT 5 FOR HER LAST SURGERY THAT WAS THE SAME PROCEDURE Cardiologist - NA   -  Chest x-ray - NA EKG -10/21  Stress Test - NA ECHO - NA Cardiac Cath - NA    Blood Thinner Instructions:ORENCIA STOPPED BY DEVESHWAR 2 WEEKS AGO Aspirin Instructions:STOP  COVID TEST- FOR TODAY   Anesthesia review: HTN  Patient denies shortness of breath, fever, cough and chest pain at PAT appointment   All instructions explained to the patient, with a verbal understanding of the material. Patient agrees to go over the instructions while at home for a better understanding. Patient also instructed to self quarantine after being tested for COVID-19. The opportunity to ask questions was provided.

## 2020-02-09 NOTE — Progress Notes (Signed)
Your procedure is scheduled on Monday February 13, 2020.  Report to [facility] Main Entrance "A" at 08:00 A.M., and check in at the Admitting office.  Call this number if you have problems the morning of surgery: [phone]  Call [phone] if you have any questions prior to your surgery date Monday-Friday 8am-4pm   Remember: Do not eat or drink after midnight the night before your surgery  Take these medicines the morning of surgery with A SIP OF WATER: amLODipine (NORVASC)  gabapentin (NEURONTIN) metroNIDAZOLE (FLAGYL) omeprazole (PRILOSEC)   If needed: acetaminophen (TYLENOL)    Per [name] - recommend stopping Orencia 2 weeks before before the surgery and restart 2 weeks after the surgery if cleared by the surgeon  As of today, STOP taking any Aspirin (unless otherwise instructed by your surgeon), Aleve, Naproxen, Ibuprofen, Motrin, Advil, Goody's, BC's, all herbal medications, fish oil, and all vitamins.    The Morning of Surgery  Do not wear jewelry, make-up or nail polish.  Do not wear lotions, powders, or perfumes, or deodorant  Do not shave 48 hours prior to surgery.    Do not bring valuables to the hospital.  [facility] is not responsible for any belongings or valuables.  If you are a smoker, DO NOT Smoke 24 hours prior to surgery  If you wear a CPAP at night please bring your mask the morning of surgery   Remember that you must have someone to transport you home after your surgery, and remain with you for 24 hours if you are discharged the same day.   Please bring cases for contacts, glasses, hearing aids, dentures or bridgework because it cannot be worn into surgery.    Leave your suitcase in the car.  After surgery it may be brought to your room.  For patients admitted to the hospital, discharge time will be determined by your treatment team.  Patients discharged the day of surgery will not be allowed to drive home.    Special  instructions:   [facility]- Preparing For Surgery  Before surgery, you can play an important role. Because skin is not sterile, your skin needs to be as free of germs as possible. You can reduce the number of germs on your skin by washing with CHG (chlorahexidine gluconate) Soap before surgery.  CHG is an antiseptic cleaner which kills germs and bonds with the skin to continue killing germs even after washing.    Oral Hygiene is also important to reduce your risk of infection.  Remember - BRUSH YOUR TEETH THE MORNING OF SURGERY WITH YOUR REGULAR TOOTHPASTE  Please do not use if you have an allergy to CHG or antibacterial soaps. If your skin becomes reddened/irritated stop using the CHG.  Do not shave (including legs and underarms) for at least 48 hours prior to first CHG shower. It is OK to shave your face.  Please follow these instructions carefully.   1. Shower the NIGHT BEFORE SURGERY and the MORNING OF SURGERY with CHG Soap.   2. If you chose to wash your hair and body, wash as usual with your normal shampoo and body-wash/soap.  3. Rinse your hair and body thoroughly to remove the shampoo and soap.  4. Apply CHG directly to the skin (ONLY FROM THE NECK DOWN) and wash gently with a scrungie or a clean washcloth.   5. Do not use on open wounds or open sores. Avoid contact with your eyes, ears, mouth and genitals (private parts). Wash Face  and genitals (private parts)  with your normal soap.   6. Wash thoroughly, paying special attention to the area where your surgery will be performed.  7. Thoroughly rinse your body with warm water from the neck down.  8. DO NOT shower/wash with your normal soap after using and rinsing off the CHG Soap.  9. Pat yourself dry with a CLEAN TOWEL.  10. Wear CLEAN PAJAMAS to bed the night before surgery  11. Place CLEAN SHEETS on your bed the night of your first shower and DO NOT SLEEP WITH PETS.  12. Wear comfortable clothes the morning of surgery.      Day of Surgery:  Please shower the morning of surgery with the CHG soap Do not apply any deodorants/lotions. Please wear clean clothes to the hospital/surgery center.   Remember to brush your teeth WITH YOUR REGULAR TOOTHPASTE.   Please read over the following fact sheets that you were given.

## 2020-02-10 NOTE — Progress Notes (Signed)
Anesthesia Chart Review:  Case: [id number] Date/Time: 02/13/20 0945   Procedure: Revision of cervical instrumented fusion with Occiput to Cervical 4 posterior instrumented fusion (N/A ) - 3C/RM 19   Anesthesia type: General   Pre-op diagnosis: Fracture of neck   [facility]: [facility] ROOM [facility] / [facility]   Surgeons: Jadene Pierini, MD      DISCUSSION: Patient is a 79 year old female scheduled for the above procedure. Patient with C1 and C2 fractures on 11/30/19 CT (apparently had a fall all the way back in January). S/p C1-2 posterior fusion 12/06/19. She now needs revision (see 01/25/20 CT).   History includes never smoker, postoperative N/V, HTN, HLD, GERD, anemia, multiple sclerosis, RA, peripheral neuropathy, thyroid lobectomy, augmentation mammaplasty, TKA (right 06/15/12), shoulder surgery (left reverse shoulder 05/15/17; ORIF left scapula 03/05/18; left shoulder open hardware removal, polyethylene exchange for reverse shoulder replacement 10/11/18).  Patient notified rheumatologist [name] of surgery plans. She responded, "I would recommend stopping Orencia 2 weeks before before the surgery and restart 2 weeks after the surgery if cleared by the surgeon."  Preoperative labs revealed H/H 9.8/31.8, down from 11.7/36.9 on 12/02/19 (prior to 12/06/19 cervical fusion). H/H results communicated to [facility] at Dr. Marcy Siren office. T&S was done with PAT labs.   11/10/210/03/2019 presurgical COVID-19 test negative.  Anesthesia team to evaluate on the day of surgery.   VS: BP (!) 144/70   Pulse 89   Temp 36.7 C (Oral)   Resp 18   Ht 5\' 4"  (1.626 m)   Wt 48.3 kg   SpO2 100%   BMI 18.28 kg/m    PROVIDERS: , MD is PCP  Sigmund Hazel, MD is rheumatologist Pollyann Savoy, MD is GI    LABS: Preoperative labs noted. See DISCUSSION. (all labs ordered are listed, but only abnormal results are displayed)  Labs Reviewed  CBC - Abnormal; Notable for the following components:       Result Value   RBC 3.51 (*)    Hemoglobin 9.8 (*)    HCT 31.8 (*)    All other components within normal limits  SURGICAL PCR SCREEN  BASIC METABOLIC PANEL  TYPE AND SCREEN   PFTs > 5 years ago.   IMAGES: CT C-spine 01/25/20: IMPRESSION: 1. Since the previous examination, the patient has had resection of the posterior arch of C1 with posterior fusion procedure at C1-2. Complete transverse fracture at the base of the dens with the dens displaced anterior to C2 with slight invagination. 2. C1 remains displaced towards the right by about 5 mm. 3. Right C1 screw enters the lateral mass. This may passed through the neural foraminal region. 4. Right C2 screw is in the region of the right C2-3 facet joint. Difficult to tell if this gains satisfactory bony purchase. 5. Left C1 screw enters the lateral mass. Left C2 screw enters the posterior lamina, crosses the midline and has its tip in the C2-3 interlaminar space or cortical bone of the superior lamina of C3 to the right of midline.   EKG: 12/02/19: Normal sinus rhythm Septal infarct , age undetermined Abnormal ECG Confirmed by 02/01/20 [phone]) on 12/03/2019 1:22:25 PM   CV: Denied prior stress test, echo, cardiac cath   Past Medical History:  Diagnosis Date  . Anemia   . Arthritis   . GERD (gastroesophageal reflux disease)   . History of blood transfusion   . Hyperlipidemia   . Hypertension   . MS (multiple sclerosis) (HCC)   . Neuromuscular  disorder (HCC)    multiple scleroosis/peripheral neuropathy  . PONV (postoperative nausea and vomiting)   . Rheumatoid arthritis (HCC)   . Ulcer   . Vitamin D deficiency     Past Surgical History:  Procedure Laterality Date  . AUGMENTATION MAMMAPLASTY    . BACK SURGERY    . breast augumentation    . BREAST SURGERY     biopsy  . COLONOSCOPY    . DILATION AND CURETTAGE OF UTERUS    . EYE SURGERY     both eys,cataracts  . goiter    . JOINT REPLACEMENT     . KNEE ARTHROSCOPY Right   . ORIF SHOULDER FRACTURE Left 03/05/2018   Procedure: OPEN REDUCTION INTERNAL FIXATION (ORIF) LEFT SCAPULA;  Surgeon: Beverely Low, MD;  Location: [facility] OR;  Service: Orthopedics;  Laterality: Left;  . POSTERIOR CERVICAL FUSION/FORAMINOTOMY N/A 12/06/2019   Procedure: Cervical one-two Posterior instrumented fusion;  Surgeon: Jadene Pierini, MD;  Location: [facility] OR;  Service: Neurosurgery;  Laterality: N/A;  . REVERSE SHOULDER ARTHROPLASTY Left 05/15/2017   Procedure: LEFT REVERSE SHOULDER ARTHROPLASTY;  Surgeon: Beverely Low, MD;  Location: [facility] OR;  Service: Orthopedics;  Laterality: Left;  . REVERSE SHOULDER ARTHROPLASTY Left 10/11/2018   Procedure: left shoulder irrigation and debridement, open poly exchange and removal of painful hardware;  Surgeon: Beverely Low, MD;  Location: [facility] ORS;  Service: Orthopedics;  Laterality: Left;  . THYROID LOBECTOMY    . TOTAL KNEE ARTHROPLASTY Right 06/15/2012   Procedure: RIGHT TOTAL KNEE ARTHROPLASTY;  Surgeon: [name], MD;  Location: [facility] ORS;  Service: Orthopedics;  Laterality: Right;  . WRIST SURGERY Left     MEDICATIONS: . Abatacept (ORENCIA CLICKJECT) 125 MG/ML SOAJ  . acetaminophen (TYLENOL) 650 MG CR tablet  . alendronate (FOSAMAX) 70 MG tablet  . amLODipine (NORVASC) 5 MG tablet  . Cholecalciferol (VITAMIN D) 125 MCG (5000 UT) CAPS  . Cyanocobalamin (VITAMIN B-12) 5000 MCG TBDP  . doxylamine, Sleep, (UNISOM) 25 MG tablet  . gabapentin (NEURONTIN) 100 MG capsule  . Krill Oil 500 MG CAPS  . losartan (COZAAR) 50 MG tablet  . metroNIDAZOLE (FLAGYL) 500 MG tablet  . Misc Natural Products (CYSTEX) LIQD  . Multiple Vitamin (MULTIVITAMIN WITH MINERALS) TABS  . naproxen (NAPROSYN) 500 MG tablet  . omeprazole (PRILOSEC) 40 MG capsule  . ORENCIA 125 MG/ML SOSY  . oxyCODONE-acetaminophen (PERCOCET) 5-325 MG tablet  . vitamin E 400 UNIT capsule  . zolpidem (AMBIEN) 10 MG tablet   No current facility-administered  medications for this encounter.    Shonna Chock, PA-C Surgical Short Stay/Anesthesiology [facility] Phone [phone] [facility] Phone [phone] 02/10/2020 12:02 PM

## 2020-02-10 NOTE — Anesthesia Preprocedure Evaluation (Addendum)
Anesthesia Evaluation  Patient identified by MRN, date of birth, ID band Patient awake    Reviewed: Allergy & Precautions, NPO status , Patient's Chart, lab work & pertinent test results  History of Anesthesia Complications (+) PONV and history of anesthetic complications  Airway Mallampati: III  TM Distance: >3 FB Neck ROM: Limited    Dental  (+) Teeth Intact   Pulmonary neg pulmonary ROS,    Pulmonary exam normal breath sounds clear to auscultation       Cardiovascular hypertension, Pt. on medications Normal cardiovascular exam Rhythm:Regular Rate:Normal     Neuro/Psych MS  Neuromuscular disease negative psych ROS   GI/Hepatic Neg liver ROS, GERD  Medicated,  Endo/Other  negative endocrine ROS  Renal/GU negative Renal ROS     Musculoskeletal  (+) Arthritis , Rheumatoid disorders,    Abdominal   Peds  Hematology  (+) Blood dyscrasia, anemia ,   Anesthesia Other Findings Day of surgery medications reviewed with the patient.  Reproductive/Obstetrics                           Anesthesia Physical Anesthesia Plan  ASA: II  Anesthesia Plan: General   Post-op Pain Management:    Induction: Intravenous  PONV Risk Score and Plan: 4 or greater and Dexamethasone, Ondansetron, Propofol infusion and Treatment may vary due to age or medical condition  Airway Management Planned: Oral ETT and Video Laryngoscope Planned  Additional Equipment:   Intra-op Plan:   Post-operative Plan: Extubation in OR  Informed Consent: I have reviewed the patients History and Physical, chart, labs and discussed the procedure including the risks, benefits and alternatives for the proposed anesthesia with the patient or authorized representative who has indicated his/her understanding and acceptance.       Plan Discussed with: CRNA  Anesthesia Plan Comments: (PAT note written 02/10/2020 by Shonna Chock,  PA-C. )       Anesthesia Quick Evaluation

## 2020-02-13 ENCOUNTER — Inpatient Hospital Stay (HOSPITAL_COMMUNITY): Payer: Medicare PPO

## 2020-02-13 ENCOUNTER — Inpatient Hospital Stay (HOSPITAL_COMMUNITY): Payer: Medicare PPO | Admitting: Physician Assistant

## 2020-02-13 ENCOUNTER — Encounter (HOSPITAL_COMMUNITY)
Admission: RE | Disposition: A | Discharge status: Rehabilitation Facility | Payer: Self-pay | Source: Home / Self Care | Attending: Neurological Surgery

## 2020-02-13 ENCOUNTER — Inpatient Hospital Stay (HOSPITAL_COMMUNITY): Payer: Medicare PPO | Admitting: Anesthesiology

## 2020-02-13 ENCOUNTER — Inpatient Hospital Stay (HOSPITAL_COMMUNITY)
Admission: RE | Admit: 2020-02-13 | Discharge: 2020-02-21 | DRG: 460 | Disposition: A | Discharge status: Rehabilitation Facility | Payer: Medicare PPO | Attending: Neurological Surgery | Admitting: Neurological Surgery

## 2020-02-13 DIAGNOSIS — R519 Headache, unspecified: Secondary | ICD-10-CM | POA: Diagnosis not present

## 2020-02-13 DIAGNOSIS — Z981 Arthrodesis status: Secondary | ICD-10-CM | POA: Diagnosis not present

## 2020-02-13 DIAGNOSIS — T84226D Displacement of internal fixation device of vertebrae, subsequent encounter: Secondary | ICD-10-CM

## 2020-02-13 DIAGNOSIS — T1490XA Injury, unspecified, initial encounter: Secondary | ICD-10-CM | POA: Diagnosis not present

## 2020-02-13 DIAGNOSIS — Z8249 Family history of ischemic heart disease and other diseases of the circulatory system: Secondary | ICD-10-CM | POA: Diagnosis not present

## 2020-02-13 DIAGNOSIS — S129XXA Fracture of neck, unspecified, initial encounter: Secondary | ICD-10-CM

## 2020-02-13 DIAGNOSIS — M4322 Fusion of spine, cervical region: Secondary | ICD-10-CM | POA: Diagnosis not present

## 2020-02-13 DIAGNOSIS — T84296A Other mechanical complication of internal fixation device of vertebrae, initial encounter: Secondary | ICD-10-CM | POA: Diagnosis not present

## 2020-02-13 DIAGNOSIS — S12090A Other displaced fracture of first cervical vertebra, initial encounter for closed fracture: Secondary | ICD-10-CM | POA: Diagnosis not present

## 2020-02-13 DIAGNOSIS — G8918 Other acute postprocedural pain: Secondary | ICD-10-CM | POA: Diagnosis not present

## 2020-02-13 DIAGNOSIS — G35 Multiple sclerosis: Secondary | ICD-10-CM | POA: Diagnosis not present

## 2020-02-13 DIAGNOSIS — G629 Polyneuropathy, unspecified: Secondary | ICD-10-CM | POA: Diagnosis present

## 2020-02-13 DIAGNOSIS — M532X2 Spinal instabilities, cervical region: Secondary | ICD-10-CM | POA: Diagnosis present

## 2020-02-13 DIAGNOSIS — G959 Disease of spinal cord, unspecified: Secondary | ICD-10-CM | POA: Diagnosis not present

## 2020-02-13 DIAGNOSIS — E559 Vitamin D deficiency, unspecified: Secondary | ICD-10-CM | POA: Diagnosis not present

## 2020-02-13 DIAGNOSIS — M1990 Unspecified osteoarthritis, unspecified site: Secondary | ICD-10-CM | POA: Diagnosis present

## 2020-02-13 DIAGNOSIS — M4312 Spondylolisthesis, cervical region: Secondary | ICD-10-CM | POA: Diagnosis not present

## 2020-02-13 DIAGNOSIS — K219 Gastro-esophageal reflux disease without esophagitis: Secondary | ICD-10-CM | POA: Diagnosis present

## 2020-02-13 DIAGNOSIS — Y792 Prosthetic and other implants, materials and accessory orthopedic devices associated with adverse incidents: Secondary | ICD-10-CM | POA: Diagnosis present

## 2020-02-13 DIAGNOSIS — K5903 Drug induced constipation: Secondary | ICD-10-CM | POA: Diagnosis not present

## 2020-02-13 DIAGNOSIS — S12000G Unspecified displaced fracture of first cervical vertebra, subsequent encounter for fracture with delayed healing: Secondary | ICD-10-CM | POA: Diagnosis present

## 2020-02-13 DIAGNOSIS — R441 Visual hallucinations: Secondary | ICD-10-CM | POA: Diagnosis not present

## 2020-02-13 DIAGNOSIS — S12120A Other displaced dens fracture, initial encounter for closed fracture: Secondary | ICD-10-CM | POA: Diagnosis not present

## 2020-02-13 DIAGNOSIS — R339 Retention of urine, unspecified: Secondary | ICD-10-CM | POA: Diagnosis not present

## 2020-02-13 DIAGNOSIS — S12100A Unspecified displaced fracture of second cervical vertebra, initial encounter for closed fracture: Secondary | ICD-10-CM | POA: Diagnosis not present

## 2020-02-13 DIAGNOSIS — Z9889 Other specified postprocedural states: Secondary | ICD-10-CM | POA: Diagnosis not present

## 2020-02-13 DIAGNOSIS — F05 Delirium due to known physiological condition: Secondary | ICD-10-CM | POA: Diagnosis not present

## 2020-02-13 DIAGNOSIS — M9669 Fracture of other bone following insertion of orthopedic implant, joint prosthesis, or bone plate: Principal | ICD-10-CM | POA: Diagnosis present

## 2020-02-13 DIAGNOSIS — I10 Essential (primary) hypertension: Secondary | ICD-10-CM | POA: Diagnosis not present

## 2020-02-13 DIAGNOSIS — T84216A Breakdown (mechanical) of internal fixation device of vertebrae, initial encounter: Secondary | ICD-10-CM | POA: Diagnosis not present

## 2020-02-13 DIAGNOSIS — M069 Rheumatoid arthritis, unspecified: Secondary | ICD-10-CM | POA: Diagnosis present

## 2020-02-13 DIAGNOSIS — K5900 Constipation, unspecified: Secondary | ICD-10-CM | POA: Diagnosis not present

## 2020-02-13 DIAGNOSIS — R1310 Dysphagia, unspecified: Secondary | ICD-10-CM | POA: Diagnosis present

## 2020-02-13 DIAGNOSIS — Z419 Encounter for procedure for purposes other than remedying health state, unspecified: Secondary | ICD-10-CM

## 2020-02-13 DIAGNOSIS — I951 Orthostatic hypotension: Secondary | ICD-10-CM | POA: Diagnosis not present

## 2020-02-13 DIAGNOSIS — Z4789 Encounter for other orthopedic aftercare: Secondary | ICD-10-CM | POA: Diagnosis not present

## 2020-02-13 DIAGNOSIS — E785 Hyperlipidemia, unspecified: Secondary | ICD-10-CM | POA: Diagnosis not present

## 2020-02-13 DIAGNOSIS — D638 Anemia in other chronic diseases classified elsewhere: Secondary | ICD-10-CM | POA: Diagnosis not present

## 2020-02-13 DIAGNOSIS — M960 Pseudarthrosis after fusion or arthrodesis: Secondary | ICD-10-CM | POA: Diagnosis not present

## 2020-02-13 DIAGNOSIS — M958 Other specified acquired deformities of musculoskeletal system: Secondary | ICD-10-CM | POA: Diagnosis not present

## 2020-02-13 DIAGNOSIS — M40292 Other kyphosis, cervical region: Secondary | ICD-10-CM | POA: Diagnosis not present

## 2020-02-13 DIAGNOSIS — S12000A Unspecified displaced fracture of first cervical vertebra, initial encounter for closed fracture: Secondary | ICD-10-CM | POA: Diagnosis not present

## 2020-02-13 DIAGNOSIS — S12100D Unspecified displaced fracture of second cervical vertebra, subsequent encounter for fracture with routine healing: Secondary | ICD-10-CM | POA: Diagnosis not present

## 2020-02-13 HISTORY — PX: POSTERIOR CERVICAL FUSION/FORAMINOTOMY: SHX5038

## 2020-02-13 LAB — TYPE AND SCREEN
ABO/RH(D): O POS
Antibody Screen: NEGATIVE

## 2020-02-13 SURGERY — POSTERIOR CERVICAL FUSION/FORAMINOTOMY LEVEL 3
Anesthesia: General | Site: Spine Cervical

## 2020-02-13 MED ORDER — BACITRACIN ZINC 500 UNIT/GM EX OINT
TOPICAL_OINTMENT | CUTANEOUS | Status: DC | PRN
  Administered 2020-02-13: 1 via TOPICAL

## 2020-02-13 MED ORDER — VITAMIN B-12 5000 MCG PO TBDP: 5000.0000 ug | ORAL_TABLET | Freq: Every day | ORAL | Status: DC

## 2020-02-13 MED ORDER — ONDANSETRON HCL 4 MG/2ML IJ SOLN
4.0000 mg | Freq: Four times a day (QID) | INTRAMUSCULAR | Status: DC | PRN
  Administered 2020-02-17: 4 mg via INTRAVENOUS
  Filled 2020-02-13: qty 2

## 2020-02-13 MED ORDER — HYDROMORPHONE HCL 1 MG/ML IJ SOLN
1.0000 mg | INTRAMUSCULAR | Status: DC | PRN
  Administered 2020-02-13 – 2020-02-21 (×17): 1 mg via INTRAVENOUS
  Filled 2020-02-13 (×17): qty 1

## 2020-02-13 MED ORDER — GLYCOPYRROLATE PF 0.2 MG/ML IJ SOSY
PREFILLED_SYRINGE | INTRAMUSCULAR | Status: DC | PRN
  Administered 2020-02-13: .1 mg via INTRAVENOUS

## 2020-02-13 MED ORDER — 0.9 % SODIUM CHLORIDE (POUR BTL) OPTIME
TOPICAL | Status: DC | PRN
  Administered 2020-02-13: 1000 mL

## 2020-02-13 MED ORDER — ONDANSETRON HCL 4 MG PO TABS: 4.0000 mg | ORAL_TABLET | Freq: Four times a day (QID) | ORAL | Status: DC | PRN

## 2020-02-13 MED ORDER — DEXAMETHASONE SODIUM PHOSPHATE 10 MG/ML IJ SOLN
INTRAMUSCULAR | Status: DC | PRN
  Administered 2020-02-13: 5 mg via INTRAVENOUS

## 2020-02-13 MED ORDER — ACETAMINOPHEN 650 MG RE SUPP: 650.0000 mg | RECTAL | Status: DC | PRN

## 2020-02-13 MED ORDER — PHENYLEPHRINE 40 MCG/ML (10ML) SYRINGE FOR IV PUSH (FOR BLOOD PRESSURE SUPPORT)
PREFILLED_SYRINGE | INTRAVENOUS | Status: AC
  Filled 2020-02-13: qty 10

## 2020-02-13 MED ORDER — CYCLOBENZAPRINE HCL 10 MG PO TABS
10.0000 mg | ORAL_TABLET | Freq: Three times a day (TID) | ORAL | Status: DC | PRN
  Administered 2020-02-13 – 2020-02-21 (×10): 10 mg via ORAL
  Filled 2020-02-13 (×10): qty 1

## 2020-02-13 MED ORDER — ONDANSETRON HCL 4 MG/2ML IJ SOLN: 4.0000 mg | Freq: Once | INTRAMUSCULAR | Status: DC | PRN

## 2020-02-13 MED ORDER — PHENYLEPHRINE HCL-NACL 10-0.9 MG/250ML-% IV SOLN
INTRAVENOUS | Status: DC | PRN
  Administered 2020-02-13: 50 ug/min via INTRAVENOUS

## 2020-02-13 MED ORDER — SODIUM CHLORIDE 0.9 % IV SOLN
0.0500 ug/kg/min | INTRAVENOUS | Status: DC
  Administered 2020-02-13: .1 ug/kg/min via INTRAVENOUS
  Filled 2020-02-13 (×2): qty 5000

## 2020-02-13 MED ORDER — ADULT MULTIVITAMIN W/MINERALS CH
1.0000 | ORAL_TABLET | ORAL | Status: DC
  Administered 2020-02-15 – 2020-02-20 (×3): 1 via ORAL
  Filled 2020-02-13: qty 1

## 2020-02-13 MED ORDER — CEFAZOLIN SODIUM-DEXTROSE 2-4 GM/100ML-% IV SOLN
INTRAVENOUS | Status: AC
  Filled 2020-02-13: qty 100

## 2020-02-13 MED ORDER — ORAL CARE MOUTH RINSE: 15.0000 mL | Freq: Once | OROMUCOSAL | Status: AC

## 2020-02-13 MED ORDER — HEMOSTATIC AGENTS (NO CHARGE) OPTIME
TOPICAL | Status: DC | PRN
  Administered 2020-02-13: 1 via TOPICAL

## 2020-02-13 MED ORDER — GABAPENTIN 100 MG PO CAPS
100.0000 mg | ORAL_CAPSULE | Freq: Three times a day (TID) | ORAL | Status: DC
  Administered 2020-02-14 – 2020-02-21 (×19): 100 mg via ORAL
  Filled 2020-02-13 (×19): qty 1

## 2020-02-13 MED ORDER — PHENYLEPHRINE HCL (PRESSORS) 10 MG/ML IV SOLN
INTRAVENOUS | Status: AC
  Filled 2020-02-13: qty 1

## 2020-02-13 MED ORDER — LIDOCAINE-EPINEPHRINE 1 %-1:100000 IJ SOLN
INTRAMUSCULAR | Status: AC
  Filled 2020-02-13: qty 1

## 2020-02-13 MED ORDER — SODIUM CHLORIDE 0.9% FLUSH: 3.0000 mL | INTRAVENOUS | Status: DC | PRN

## 2020-02-13 MED ORDER — DOXYLAMINE SUCCINATE (SLEEP) 25 MG PO TABS: 25.0000 mg | ORAL_TABLET | Freq: Every evening | ORAL | Status: DC | PRN

## 2020-02-13 MED ORDER — CYCLOBENZAPRINE HCL 10 MG PO TABS
ORAL_TABLET | ORAL | Status: AC
  Administered 2020-02-14: 10 mg
  Filled 2020-02-13: qty 1

## 2020-02-13 MED ORDER — FENTANYL CITRATE (PF) 100 MCG/2ML IJ SOLN
25.0000 ug | INTRAMUSCULAR | Status: DC | PRN
  Administered 2020-02-13 (×4): 25 ug via INTRAVENOUS
  Administered 2020-02-13: 50 ug via INTRAVENOUS

## 2020-02-13 MED ORDER — FENTANYL CITRATE (PF) 100 MCG/2ML IJ SOLN
INTRAMUSCULAR | Status: AC
  Filled 2020-02-13: qty 2

## 2020-02-13 MED ORDER — LOSARTAN POTASSIUM 50 MG PO TABS
50.0000 mg | ORAL_TABLET | Freq: Two times a day (BID) | ORAL | Status: DC
  Administered 2020-02-14 – 2020-02-21 (×11): 50 mg via ORAL
  Filled 2020-02-13 (×12): qty 1

## 2020-02-13 MED ORDER — AMLODIPINE BESYLATE 5 MG PO TABS
5.0000 mg | ORAL_TABLET | Freq: Every day | ORAL | Status: DC
  Administered 2020-02-14 – 2020-02-21 (×6): 5 mg via ORAL
  Filled 2020-02-13 (×7): qty 1

## 2020-02-13 MED ORDER — LIDOCAINE 2% (20 MG/ML) 5 ML SYRINGE
INTRAMUSCULAR | Status: DC | PRN
  Administered 2020-02-13: 80 mg via INTRAVENOUS

## 2020-02-13 MED ORDER — PHENOL 1.4 % MT LIQD: 1.0000 | OROMUCOSAL | Status: DC | PRN

## 2020-02-13 MED ORDER — BACITRACIN ZINC 500 UNIT/GM EX OINT
TOPICAL_OINTMENT | CUTANEOUS | Status: AC
  Filled 2020-02-13: qty 28.35

## 2020-02-13 MED ORDER — POLYETHYLENE GLYCOL 3350 17 G PO PACK
17.0000 g | PACK | Freq: Every day | ORAL | Status: DC | PRN
  Administered 2020-02-18: 17 g via ORAL
  Filled 2020-02-13: qty 1

## 2020-02-13 MED ORDER — ACETAMINOPHEN 10 MG/ML IV SOLN
INTRAVENOUS | Status: DC | PRN
  Administered 2020-02-13: 1000 mg via INTRAVENOUS

## 2020-02-13 MED ORDER — HYDROMORPHONE HCL 1 MG/ML IJ SOLN
INTRAMUSCULAR | Status: AC
  Filled 2020-02-13: qty 0.5

## 2020-02-13 MED ORDER — MENTHOL 3 MG MT LOZG: 1.0000 | LOZENGE | OROMUCOSAL | Status: DC | PRN

## 2020-02-13 MED ORDER — ONDANSETRON HCL 4 MG/2ML IJ SOLN
INTRAMUSCULAR | Status: AC
  Filled 2020-02-13: qty 2

## 2020-02-13 MED ORDER — FENTANYL CITRATE (PF) 250 MCG/5ML IJ SOLN
INTRAMUSCULAR | Status: AC
  Filled 2020-02-13: qty 5

## 2020-02-13 MED ORDER — ACETAMINOPHEN 325 MG PO TABS
650.0000 mg | ORAL_TABLET | ORAL | Status: DC | PRN
  Administered 2020-02-16 – 2020-02-20 (×3): 650 mg via ORAL
  Filled 2020-02-13 (×3): qty 2

## 2020-02-13 MED ORDER — SODIUM CHLORIDE 0.9 % IV SOLN: 250.0000 mL | INTRAVENOUS | Status: DC

## 2020-02-13 MED ORDER — PROPOFOL 10 MG/ML IV BOLUS
INTRAVENOUS | Status: DC | PRN
  Administered 2020-02-13: 100 mg via INTRAVENOUS
  Administered 2020-02-13: 25 mg via INTRAVENOUS
  Administered 2020-02-13: 100 mg via INTRAVENOUS
  Administered 2020-02-13: 30 mg via INTRAVENOUS

## 2020-02-13 MED ORDER — PHENYLEPHRINE 40 MCG/ML (10ML) SYRINGE FOR IV PUSH (FOR BLOOD PRESSURE SUPPORT)
PREFILLED_SYRINGE | INTRAVENOUS | Status: DC | PRN
  Administered 2020-02-13: 80 ug via INTRAVENOUS
  Administered 2020-02-13: 120 ug via INTRAVENOUS

## 2020-02-13 MED ORDER — EPHEDRINE SULFATE-NACL 50-0.9 MG/10ML-% IV SOSY
PREFILLED_SYRINGE | INTRAVENOUS | Status: DC | PRN
  Administered 2020-02-13: 5 mg via INTRAVENOUS

## 2020-02-13 MED ORDER — OXYCODONE HCL 5 MG PO TABS: 5.0000 mg | ORAL_TABLET | ORAL | Status: DC | PRN

## 2020-02-13 MED ORDER — HYDROMORPHONE HCL 1 MG/ML IJ SOLN
INTRAMUSCULAR | Status: DC | PRN
  Administered 2020-02-13 (×2): .25 mg via INTRAVENOUS

## 2020-02-13 MED ORDER — FENTANYL CITRATE (PF) 250 MCG/5ML IJ SOLN
INTRAMUSCULAR | Status: DC | PRN
  Administered 2020-02-13: 25 ug via INTRAVENOUS
  Administered 2020-02-13: 75 ug via INTRAVENOUS
  Administered 2020-02-13 (×2): 25 ug via INTRAVENOUS

## 2020-02-13 MED ORDER — GLYCOPYRROLATE PF 0.2 MG/ML IJ SOSY
PREFILLED_SYRINGE | INTRAMUSCULAR | Status: AC
  Filled 2020-02-13: qty 1

## 2020-02-13 MED ORDER — VITAMIN D 25 MCG (1000 UNIT) PO TABS
5000.0000 [IU] | ORAL_TABLET | Freq: Every day | ORAL | Status: DC
  Administered 2020-02-14 – 2020-02-21 (×7): 5000 [IU] via ORAL
  Filled 2020-02-13 (×7): qty 5

## 2020-02-13 MED ORDER — ACETAMINOPHEN 500 MG PO TABS
ORAL_TABLET | ORAL | Status: AC
  Filled 2020-02-13: qty 2

## 2020-02-13 MED ORDER — LIDOCAINE HCL (PF) 2 % IJ SOLN
INTRAMUSCULAR | Status: AC
  Filled 2020-02-13: qty 5

## 2020-02-13 MED ORDER — CEFAZOLIN SODIUM-DEXTROSE 2-4 GM/100ML-% IV SOLN
2.0000 g | Freq: Three times a day (TID) | INTRAVENOUS | Status: AC
  Administered 2020-02-13 – 2020-02-14 (×2): 2 g via INTRAVENOUS
  Filled 2020-02-13 (×2): qty 100

## 2020-02-13 MED ORDER — ACETAMINOPHEN 10 MG/ML IV SOLN
INTRAVENOUS | Status: AC
  Filled 2020-02-13: qty 100

## 2020-02-13 MED ORDER — DEXAMETHASONE SODIUM PHOSPHATE 10 MG/ML IJ SOLN
INTRAMUSCULAR | Status: AC
  Filled 2020-02-13: qty 1

## 2020-02-13 MED ORDER — LIDOCAINE-EPINEPHRINE 1 %-1:100000 IJ SOLN
INTRAMUSCULAR | Status: DC | PRN
  Administered 2020-02-13: 10 mL

## 2020-02-13 MED ORDER — LACTATED RINGERS IV SOLN: INTRAVENOUS | Status: DC | PRN

## 2020-02-13 MED ORDER — SODIUM CHLORIDE 0.9% FLUSH
3.0000 mL | Freq: Two times a day (BID) | INTRAVENOUS | Status: DC
  Administered 2020-02-13 – 2020-02-21 (×11): 3 mL via INTRAVENOUS

## 2020-02-13 MED ORDER — PROPOFOL 10 MG/ML IV BOLUS
INTRAVENOUS | Status: AC
  Filled 2020-02-13: qty 20

## 2020-02-13 MED ORDER — DOCUSATE SODIUM 100 MG PO CAPS
100.0000 mg | ORAL_CAPSULE | Freq: Two times a day (BID) | ORAL | Status: DC
  Administered 2020-02-14 – 2020-02-21 (×13): 100 mg via ORAL
  Filled 2020-02-13 (×13): qty 1

## 2020-02-13 MED ORDER — PROPOFOL 500 MG/50ML IV EMUL
INTRAVENOUS | Status: DC | PRN
  Administered 2020-02-13: 75 ug/kg/min via INTRAVENOUS

## 2020-02-13 MED ORDER — CEFAZOLIN SODIUM-DEXTROSE 2-4 GM/100ML-% IV SOLN
2.0000 g | INTRAVENOUS | Status: AC
  Administered 2020-02-13: 2 g via INTRAVENOUS
  Filled 2020-02-13: qty 100

## 2020-02-13 MED ORDER — ACETAMINOPHEN 500 MG PO TABS
1000.0000 mg | ORAL_TABLET | Freq: Once | ORAL | Status: AC
  Administered 2020-02-13: 1000 mg via ORAL
  Filled 2020-02-13: qty 2

## 2020-02-13 MED ORDER — ONDANSETRON HCL 4 MG/2ML IJ SOLN
INTRAMUSCULAR | Status: DC | PRN
  Administered 2020-02-13: 4 mg via INTRAVENOUS

## 2020-02-13 MED ORDER — MENTHOL 3 MG MT LOZG
LOZENGE | OROMUCOSAL | Status: AC
  Filled 2020-02-13: qty 9

## 2020-02-13 MED ORDER — OXYCODONE HCL 5 MG PO TABS
10.0000 mg | ORAL_TABLET | ORAL | Status: DC | PRN
  Administered 2020-02-13 – 2020-02-14 (×3): 10 mg via ORAL
  Filled 2020-02-13 (×3): qty 2

## 2020-02-13 MED ORDER — LACTATED RINGERS IV SOLN: INTRAVENOUS | Status: DC

## 2020-02-13 MED ORDER — CHLORHEXIDINE GLUCONATE 0.12 % MT SOLN
15.0000 mL | Freq: Once | OROMUCOSAL | Status: AC
  Administered 2020-02-13: 15 mL via OROMUCOSAL
  Filled 2020-02-13: qty 15

## 2020-02-13 MED ORDER — ZOLPIDEM TARTRATE 5 MG PO TABS
5.0000 mg | ORAL_TABLET | Freq: Every evening | ORAL | Status: DC | PRN
  Administered 2020-02-13 – 2020-02-20 (×6): 5 mg via ORAL
  Filled 2020-02-13 (×6): qty 1

## 2020-02-13 SURGICAL SUPPLY — 79 items
ADH SKN CLS APL DERMABOND .7 (GAUZE/BANDAGES/DRESSINGS) ×1
APL SKNCLS STERI-STRIP NONHPOA (GAUZE/BANDAGES/DRESSINGS)
BENZOIN TINCTURE PRP APPL 2/3 (GAUZE/BANDAGES/DRESSINGS) IMPLANT
BIT DRILL 2.4 (BIT) ×1 IMPLANT
BIT DRILL 2.4MM (BIT) ×1
BIT DRILL FLEX OC 3.2 (BIT) ×2 IMPLANT
BLADE CLIPPER SURG (BLADE) ×3 IMPLANT
BONE CANC CHIPS 40CC CAN1/2 (Bone Implant) ×3 IMPLANT
BUR MATCHSTICK NEURO 3.0 LAGG (BURR) ×2 IMPLANT
BUR PRECISION FLUTE 5.0 (BURR) ×3 IMPLANT
CANISTER SUCT 3000ML PPV (MISCELLANEOUS) ×3 IMPLANT
CHIPS CANC BONE 40CC CAN1/2 (Bone Implant) ×1 IMPLANT
CONNECTOR LAT CLOSED 13 (Connector) ×4 IMPLANT
COVER WAND RF STERILE (DRAPES) ×1 IMPLANT
DECANTER SPIKE VIAL GLASS SM (MISCELLANEOUS) ×3 IMPLANT
DERMABOND ADVANCED (GAUZE/BANDAGES/DRESSINGS) ×2
DERMABOND ADVANCED .7 DNX12 (GAUZE/BANDAGES/DRESSINGS) ×1 IMPLANT
DRAPE C-ARM 42X72 X-RAY (DRAPES) ×6 IMPLANT
DRAPE LAPAROTOMY 100X72 PEDS (DRAPES) ×3 IMPLANT
DURAPREP 6ML APPLICATOR 50/CS (WOUND CARE) ×3 IMPLANT
ELECT REM PT RETURN 9FT ADLT (ELECTROSURGICAL) ×3
ELECTRODE REM PT RTRN 9FT ADLT (ELECTROSURGICAL) ×1 IMPLANT
FEE INTRAOP MONITOR IMPULS NCS (MISCELLANEOUS) IMPLANT
GAUZE 4X4 16PLY RFD (DISPOSABLE) IMPLANT
GAUZE SPONGE 4X4 12PLY STRL (GAUZE/BANDAGES/DRESSINGS) IMPLANT
GLOVE BIO SURGEON STRL SZ 6.5 (GLOVE) ×1 IMPLANT
GLOVE BIO SURGEON STRL SZ7 (GLOVE) ×2 IMPLANT
GLOVE BIO SURGEON STRL SZ7.5 (GLOVE) ×7 IMPLANT
GLOVE BIO SURGEONS STRL SZ 6.5 (GLOVE)
GLOVE BIOGEL PI IND STRL 6.5 (GLOVE) ×1 IMPLANT
GLOVE BIOGEL PI IND STRL 7.5 (GLOVE) ×1 IMPLANT
GLOVE BIOGEL PI INDICATOR 6.5 (GLOVE)
GLOVE BIOGEL PI INDICATOR 7.5 (GLOVE) ×8
GLOVE ECLIPSE 9.0 STRL (GLOVE) ×2 IMPLANT
GLOVE EXAM NITRILE LRG STRL (GLOVE) IMPLANT
GLOVE EXAM NITRILE XL STR (GLOVE) IMPLANT
GLOVE EXAM NITRILE XS STR PU (GLOVE) IMPLANT
GLOVE SURG SS PI 6.0 STRL IVOR (GLOVE) ×6 IMPLANT
GLOVE SURG SS PI 7.0 STRL IVOR (GLOVE) ×2 IMPLANT
GLOVE SURG UNDER POLY LF SZ6.5 (GLOVE) ×2 IMPLANT
GOWN STRL REUS W/ TWL LRG LVL3 (GOWN DISPOSABLE) IMPLANT
GOWN STRL REUS W/ TWL XL LVL3 (GOWN DISPOSABLE) ×1 IMPLANT
GOWN STRL REUS W/TWL 2XL LVL3 (GOWN DISPOSABLE) IMPLANT
GOWN STRL REUS W/TWL LRG LVL3 (GOWN DISPOSABLE) ×9
GOWN STRL REUS W/TWL XL LVL3 (GOWN DISPOSABLE) ×9
GRAFT BNE CHIP CANC 1-8 40 (Bone Implant) IMPLANT
HEMOSTAT POWDER KIT SURGIFOAM (HEMOSTASIS) ×1 IMPLANT
INTRAOP MONITOR FEE IMPULS NCS (MISCELLANEOUS) ×1
INTRAOP MONITOR FEE IMPULSE (MISCELLANEOUS) ×3
KIT BASIN OR (CUSTOM PROCEDURE TRAY) ×3 IMPLANT
KIT TURNOVER KIT B (KITS) ×3 IMPLANT
NEEDLE HYPO 22GX1.5 SAFETY (NEEDLE) ×3 IMPLANT
NS IRRIG 1000ML POUR BTL (IV SOLUTION) ×3 IMPLANT
PACK LAMINECTOMY NEURO (CUSTOM PROCEDURE TRAY) ×3 IMPLANT
PAD ARMBOARD 7.5X6 YLW CONV (MISCELLANEOUS) ×13 IMPLANT
PIN MAYFIELD SKULL DISP (PIN) ×3 IMPLANT
PLATE OC ADJ SMALL (Plate) ×2 IMPLANT
ROD PRE CVD 3.5X200 (Rod) ×4 IMPLANT
ROD TO ROD CROSSLINK MED (Rod) ×2 IMPLANT
SCREW 4.5X6MM OC (Screw) ×12 IMPLANT
SCREW BN 6X4.5XNS LF SPNE OC (Screw) IMPLANT
SCREW MA INFINITY 3.5X12 (Screw) ×10 IMPLANT
SCREW MULTI AXIAL 3.5X14MM (Screw) ×2 IMPLANT
SCREW MULTI AXIAL 3.5X22 (Screw) ×2 IMPLANT
SCREW TAPERED OC 4.5X8 (Screw) ×2 IMPLANT
SET SCREW INFINITY IFIX THOR (Screw) ×24 IMPLANT
SPONGE LAP 4X18 RFD (DISPOSABLE) IMPLANT
STAPLER VISISTAT 35W (STAPLE) ×3 IMPLANT
SUT ETHILON 3 0 FSL (SUTURE) IMPLANT
SUT MNCRL AB 3-0 PS2 18 (SUTURE) ×3 IMPLANT
SUT MON AB 3-0 SH 27 (SUTURE)
SUT MON AB 3-0 SH27 (SUTURE) ×1 IMPLANT
SUT VIC AB 0 CT1 18XCR BRD8 (SUTURE) ×1 IMPLANT
SUT VIC AB 0 CT1 8-18 (SUTURE) ×6
SUT VIC AB 2-0 CP2 18 (SUTURE) ×3 IMPLANT
TOWEL GREEN STERILE (TOWEL DISPOSABLE) ×3 IMPLANT
TOWEL GREEN STERILE FF (TOWEL DISPOSABLE) ×3 IMPLANT
UNDERPAD 30X36 HEAVY ABSORB (UNDERPADS AND DIAPERS) ×1 IMPLANT
WATER STERILE IRR 1000ML POUR (IV SOLUTION) ×3 IMPLANT

## 2020-02-13 NOTE — Op Note (Signed)
PATIENT: Michele Bryan  DAY OF SURGERY: 02/13/20   PRE-OPERATIVE DIAGNOSIS:  Unstable C1-2 fracture with deformity   POST-OPERATIVE DIAGNOSIS:  Same   PROCEDURE:  Occiput to C5 posterior instrumented fusion, revision of prior C1-2 fusion   SURGEON:  Surgeon(s) and Role:    Jadene Pierini, MD - Primary    Julio Sicks, MD - Assisting   ANESTHESIA: ETGA   BRIEF HISTORY: This is a 79 year old woman who presented with dysphagia, neck pain, and occipitocervical deformity after failure of her C1-2 construct. This was discussed with the patient and her husband as well as risks, benefits, and alternatives and wished to proceed with surgery. Along with the usual risks of surgery, we discussed the risk of further hardware failure and the significant loss of ROM with O-C fusion.   OPERATIVE DETAIL: The patient was taken to the operating room and anesthesia was induced by the anesthesia team. Pre-flip baseline SSEPs & MEPs were obtained and within normal limits. The patient was placed in a [place] head holder and carefully placed prone on the OR table with padding of all pressure points. Post-flip IOM was stable. Her alignment was carefully analyzed visually and fluoroscopy was used to evaluate her occipitocervical parameters. Her position was adjusted and she the [place] head holder was secured to the OR table.  A formal time out was performed with two patient identifiers and confirmed the operative site. The operative site was marked, hair was clipped with surgical clippers, the area was then prepped and draped in a sterile fashion. The prior midline incision was opened and extended cranially and caudally to expose from the occiput to C4. Subperiosteal dissection was performed bilaterally with attention to the known laminar defect at C1.   The caps and rods were removed. The bone around the C2 pars screw was fractured, the screw was loose and therefore removed. This was replaced with a  translaminar screw. A penfield 4 was used to palpate the epidural space while a hand drill was used, tapped, and screw placed.   The C1 screws had good pullout strength and were left in place.  An occipital keel plate was placed in the midline and secured with 4 blunt screws with good purchase of the inner table. Each of these was made by drilling with progressive depth stops and palpation with a ball tipped probe until the inner table was breached. There was no evidence of dural puncture or CSF leak appreciated intra-operatively.   Lateral mass screws were then placed bilaterally at C3 and C4. At C3 on the left, the facet fractured despite using a tap before using self-tapping screws. I therefore elected to proceed with extension of the construct down to C5 to get a stronger construct. The patient's alignment was then checked again with fluoroscopy and was improved from preop.   The facets were decorticated out bilaterally between C2-3, C3-4, C1-2, and between the occiput and C2. Bone graft was then placed along these joints bilaterally as well as between C2-occiput. Rods were contoured bilaterally to avoid any stress on the construct and caps were placed. To incorporate the laminar screws, straight connectors were attached to connect the screws to the rods. he rods were connected with caps and finger tightened, alignment was verified, and then final tightened.   All instrument and sponge counts were correct, the incision was then closed in layers. The patient was then returned to anesthesia for emergence. No apparent complications at the completion of the procedure.  EBL:    DRAINS: none   SPECIMENS: none   Jadene Pierini, MD 02/13/20 11:20 AM  \

## 2020-02-13 NOTE — Progress Notes (Signed)
Patient and spouse updated on surgery time change.  Verbalized understanding.  Nurse assisted patient to the restroom.

## 2020-02-13 NOTE — Progress Notes (Signed)
Patient updated on delay. Comfort measures offered.  

## 2020-02-13 NOTE — H&P (Signed)
Surgical H&P Update  HPI: 79 y.o. woman with a history of fracture s/p C1-2 posterior instrumented fusion. She presented with recurrence of her chin on chest deformity with worsening pain and new dysphagia, CT confirmed fracture of the bone around her C2 screws. She now presents for revision of her hardware and extension of her instrumented fusion. No changes in health since she was last seen. Still having symptoms and wishes to proceed with surgery.  PMHx:  Past Medical History:  Diagnosis Date  . Anemia   . Arthritis   . GERD (gastroesophageal reflux disease)   . History of blood transfusion   . Hyperlipidemia   . Hypertension   . MS (multiple sclerosis) (HCC)   . Neuromuscular disorder (HCC)    multiple scleroosis/peripheral neuropathy  . PONV (postoperative nausea and vomiting)   . Rheumatoid arthritis (HCC)   . Ulcer   . Vitamin D deficiency    FamHx:  Family History  Problem Relation Age of Onset  . Heart attack Father   . Breast cancer Neg Hx    SocHx:  reports that she has never smoked. She has never used smokeless tobacco. She reports that she does not drink alcohol and does not use drugs.  Physical Exam: Strength 5/5 x4, SILTx4, chin on chest deformity with some positive occipitocervical sagittal balance  Assesment/Plan: 79 y.o. woman with RA, unstable C1-2 fracture s/p C1-2 posterior fusion with hardware failure, here for revision of hardware and extension of instrumented fusion. Risks, benefits, and alternatives discussed and the patient would like to continue with surgery.  -OR today -3C post-op  Jadene Pierini, MD 02/13/20 11:11 AM

## 2020-02-13 NOTE — Brief Op Note (Signed)
02/13/2020  7:25 PM  PATIENT:  Lanney Gins  79 y.o. female  PRE-OPERATIVE DIAGNOSIS:  Fracture of neck  POST-OPERATIVE DIAGNOSIS:  Fracture of neck  PROCEDURE:  Procedure(s) with comments: Revision of cervical instrumented fusion with Occiput to Cervical Four posterior instrumented fusion (N/A) - posterior  SURGEON:  Surgeon(s) and Role:    * Doreena Maulden, Clovis Pu, MD - Primary    * Julio Sicks, MD - Assisting  PHYSICIAN ASSISTANT:   ANESTHESIA:   general  EBL:  100 mL   BLOOD ADMINISTERED:none  DRAINS: none   LOCAL MEDICATIONS USED:  LIDOCAINE   SPECIMEN:  No Specimen  DISPOSITION OF SPECIMEN:  N/A  COUNTS:  YES  TOURNIQUET:  * No tourniquets in log *  DICTATION: .Note written in EPIC  PLAN OF CARE: Admit to inpatient   PATIENT DISPOSITION:  PACU - hemodynamically stable.   Delay start of Pharmacological VTE agent (>24hrs) due to surgical blood loss or risk of bleeding: yes

## 2020-02-13 NOTE — Transfer of Care (Signed)
Immediate Anesthesia Transfer of Care Note  Patient: Michele Bryan  Procedure(s) Performed: Revision of cervical instrumented fusion with Occiput to Cervical Four posterior instrumented fusion (N/A Spine Cervical)  Patient Location: PACU  Anesthesia Type:General  Level of Consciousness: awake, alert , oriented and patient cooperative  Airway & Oxygen Therapy: Patient Spontanous Breathing and Patient connected to face mask oxygen  Post-op Assessment: Report given to RN and Post -op Vital signs reviewed and stable  Post vital signs: Reviewed and stable  Last Vitals:  Vitals Value Taken Time  BP 141/75 02/13/20 1931  Temp    Pulse 85 02/13/20 1935  Resp 20 02/13/20 1935  SpO2 100 % 02/13/20 1935  Vitals shown include unvalidated device data.  Last Pain:  Vitals:   02/13/20 1105  TempSrc:   PainSc: 0-No pain         Complications: No complications documented.

## 2020-02-13 NOTE — Anesthesia Procedure Notes (Signed)
Procedure Name: Intubation Date/Time: 02/13/2020 3:18 PM Performed by: Audie Pinto, CRNA Pre-anesthesia Checklist: Patient identified, Emergency Drugs available, Suction available and Patient being monitored Patient Re-evaluated:Patient Re-evaluated prior to induction Oxygen Delivery Method: Circle system utilized Preoxygenation: Pre-oxygenation with 100% oxygen Induction Type: IV induction Ventilation: Mask ventilation without difficulty Laryngoscope Size: Glidescope and 3 Grade View: Grade II Tube type: Oral Tube size: 7.0 mm Number of attempts: 1 Airway Equipment and Method: Stylet and Oral airway Placement Confirmation: ETT inserted through vocal cords under direct vision,  positive ETCO2 and breath sounds checked- equal and bilateral Secured at: 22 cm Tube secured with: Tape Dental Injury: Teeth and Oropharynx as per pre-operative assessment  Difficulty Due To: Difficulty was anticipated and Difficult Airway- due to reduced neck mobility

## 2020-02-14 ENCOUNTER — Encounter (HOSPITAL_COMMUNITY): Payer: Self-pay | Admitting: Neurological Surgery

## 2020-02-14 ENCOUNTER — Other Ambulatory Visit: Payer: Self-pay

## 2020-02-14 ENCOUNTER — Inpatient Hospital Stay (HOSPITAL_COMMUNITY): Payer: Medicare PPO

## 2020-02-14 MED ORDER — OXYCODONE HCL 5 MG PO TABS
15.0000 mg | ORAL_TABLET | ORAL | Status: DC | PRN
  Administered 2020-02-14 – 2020-02-21 (×13): 15 mg via ORAL
  Filled 2020-02-14 (×13): qty 3

## 2020-02-14 MED ORDER — OXYCODONE HCL 5 MG PO TABS
10.0000 mg | ORAL_TABLET | ORAL | Status: DC | PRN
Start: 2020-02-14 — End: 2020-02-21
  Administered 2020-02-14 – 2020-02-21 (×9): 10 mg via ORAL
  Filled 2020-02-14 (×9): qty 2

## 2020-02-14 NOTE — Evaluation (Signed)
Physical Therapy Evaluation Patient Details Name: Michele Bryan MRN: [id number] DOB: 09-20-1940 Today's Date: 02/14/2020   History of Present Illness  79 y.o. female presenting with unstable C1-2 fx. with deformity s/p revision and C1-C5 posterior instrumented fusion. PMHx significant for fx. s/p C1-2 fusion, HLD, HTN, MS, peripheral neuropathy and RA.  Clinical Impression  Pt admitted with above diagnosis. At the time of PT eval, pt was able to demonstrate transfers and ambulation with up to min assist with SPC, progressing to min guard assist with the RW for support. Pt was educated on precautions, appropriate activity progression, and car transfer. Pt currently with functional limitations due to the deficits listed below (see PT Problem List). Pt will benefit from skilled PT to increase their independence and safety with mobility to allow discharge to the venue listed below.      Follow Up Recommendations Home health PT;Supervision for mobility/OOB    Equipment Recommendations  None recommended by PT    Recommendations for Other Services       Precautions / Restrictions Precautions Precautions: Cervical;Fall Precaution Booklet Issued: Yes (comment) Precaution Comments: Patient able to recall cervical precautions. Required Braces or Orthoses: Cervical Brace Cervical Brace: Soft collar;For comfort Restrictions Weight Bearing Restrictions: No      Mobility  Bed Mobility Overal bed mobility: Needs Assistance Bed Mobility: Supine to Sit     Supine to sit: Min guard     General bed mobility comments: Pt was cued for log roll technique however sat straight up into long-sitting and pivoted around to the EOB instead. No assist required. Reinforced log roll.    Transfers Overall transfer level: Needs assistance Equipment used: Straight cane;Rolling walker (2 wheeled) Transfers: Sit to/from Stand Sit to Stand: Min guard         General transfer comment: Hands on guarded  required for power-up to full stand from low EOB. SPC initially to stand and RW by end to sit.  Ambulation/Gait Ambulation/Gait assistance: Min guard;Min assist Gait Distance (Feet): 125 Feet Assistive device: Rolling walker (2 wheeled);Straight cane Gait Pattern/deviations: Step-through pattern;Decreased stride length;Trunk flexed Gait velocity: Decreased Gait velocity interpretation: <1.31 ft/sec, indicative of household ambulator General Gait Details: Initially with SPC and HHA. Pt required min assist to maintain balance, and pt unsteady with ambulation. With RW, pt required min guard assist and pt able to improve overall gait pattern. VC's throughout for improved posture and closer walker proximity as cervical flexion was signficant.  Stairs            Wheelchair Mobility    Modified Rankin (Stroke Patients Only)       Balance Overall balance assessment: Needs assistance Sitting-balance support: Single extremity supported;Feet supported Sitting balance-Leahy Scale: Poor Sitting balance - Comments: Patient required at least unilateral UE support or external assist to maintain sitting balance at EOB. Postural control: Right lateral lean Standing balance support: Single extremity supported;During functional activity Standing balance-Leahy Scale: Poor Standing balance comment: Reliant on UE support                             Pertinent Vitals/Pain Pain Assessment: Faces Faces Pain Scale: Hurts even more Pain Location: L shoulder and cervical neck Pain Descriptors / Indicators: Constant;Grimacing;Sharp;Operative site guarding Pain Intervention(s): Limited activity within patient's tolerance;Monitored during session;Repositioned    Home Living Family/patient expects to be discharged to:: Private residence Living Arrangements: Spouse/significant other Available Help at Discharge: Family;Available 24 hours/day Type of Home: House  Home Access: Stairs to  enter Entrance Stairs-Rails: Right Entrance Stairs-Number of Steps: 1 from garage Home Layout: Two level;Able to live on main level with bedroom/bathroom Home Equipment: Gilmer Mor - single point;Shower seat - built in;Grab bars - tub/shower;Grab bars - toilet;Cane - quad;Hand held Careers information officer - 2 wheels      Prior Function Level of Independence: Independent with assistive device(s)         Comments: Occasional use of SPC. Patient notes "furniture walking" at home.     Hand Dominance   Dominant Hand: Right    Extremity/Trunk Assessment   Upper Extremity Assessment Upper Extremity Assessment: Defer to OT evaluation    Lower Extremity Assessment Lower Extremity Assessment: Generalized weakness (history significant for MS)    Cervical / Trunk Assessment Cervical / Trunk Assessment: Other exceptions Cervical / Trunk Exceptions: s/p cervical sx.  Communication   Communication: No difficulties  Cognition Arousal/Alertness: Awake/alert Behavior During Therapy: WFL for tasks assessed/performed Overall Cognitive Status: Impaired/Different from baseline Area of Impairment: Memory                     Memory: Decreased short-term memory                General Comments      Exercises     Assessment/Plan    PT Assessment Patient needs continued PT services  PT Problem List Decreased strength;Decreased range of motion;Decreased activity tolerance;Decreased balance;Decreased mobility;Decreased cognition;Decreased knowledge of use of DME;Decreased safety awareness;Decreased knowledge of precautions;Pain       PT Treatment Interventions DME instruction;Gait training;Functional mobility training;Therapeutic activities;Therapeutic exercise;Neuromuscular re-education;Patient/family education    PT Goals (Current goals can be found in the Care Plan section)  Acute Rehab PT Goals Patient Stated Goal: To return home. PT Goal Formulation: With patient/family Time  For Goal Achievement: 02/21/20 Potential to Achieve Goals: Good    Frequency Min 5X/week   Barriers to discharge        Co-evaluation               AM-PAC PT "6 Clicks" Mobility  Outcome Measure Help needed turning from your back to your side while in a flat bed without using bedrails?: None Help needed moving from lying on your back to sitting on the side of a flat bed without using bedrails?: A Little Help needed moving to and from a bed to a chair (including a wheelchair)?: A Little Help needed standing up from a chair using your arms (e.g., wheelchair or bedside chair)?: A Little Help needed to walk in hospital room?: A Little Help needed climbing 3-5 steps with a railing? : A Little 6 Click Score: 19    End of Session Equipment Utilized During Treatment: Gait belt Activity Tolerance: Patient tolerated treatment well Patient left: in chair;with call bell/phone within reach;with family/visitor present Nurse Communication: Mobility status PT Visit Diagnosis: Unsteadiness on feet (R26.81);Pain;Other symptoms and signs involving the nervous system (R29.898) Pain - part of body:  (neck, shoulders (L worse than R))    Time: 1749-4496 PT Time Calculation (min) (ACUTE ONLY): 21 min   Charges:   PT Evaluation $PT Eval Moderate Complexity: 1 Mod          Conni Slipper, PT, DPT Acute Rehabilitation Services Pager: [phone] Office: [phone]   Marylynn Pearson 02/14/2020, 1:12 PM

## 2020-02-14 NOTE — Evaluation (Signed)
Occupational Therapy Evaluation Patient Details Name: Michele Bryan MRN: [id number] DOB: 09-04-1940 Today's Date: 02/14/2020    History of Present Illness 79 y.o. female presenting with unstable C1-2 fx. with deformity s/p revision and C1-C5 posterior instrumented fusion. PMHx significant for fx. s/p C1-2 fusion, HLD, HTN, MS, peripheral neuropathy and RA.   Clinical Impression   PTA patient was independent with ADLs/IADLs including meal prep and reports occasional use of SPC for mobility in the home. Patient currently presents below baseline level of function with deficits in static/dynamic sitting/standing balance, increased need for assistance with short-distance functional mobility, and 10/10 pain in L shoulder and cervical neck s/p premedication. Patient currently demonstrates Min A grossly for ADLs and ADL transfers. Patient would benefit from continued acute OT services to maximize safety and independence with self-care tasks in prep for d/c home with recommendation for 24hr supervision/assist from family.     Follow Up Recommendations  Home health OT;Supervision/Assistance - 24 hour (Patient currently declining HHOT.)    Equipment Recommendations  None recommended by OT (Patient has necessary DME)    Recommendations for Other Services       Precautions / Restrictions Precautions Precautions: Cervical;Fall Precaution Booklet Issued: Yes (comment) Precaution Comments: Patient able to recall cervical precautions. Required Braces or Orthoses:  (None) Restrictions Weight Bearing Restrictions: No      Mobility Bed Mobility Overal bed mobility: Needs Assistance Bed Mobility: Rolling;Sidelying to Sit;Sit to Supine Rolling: Supervision Sidelying to sit: Min guard   Sit to supine: Min guard   General bed mobility comments: Patient with good recall of log-rolling technique from previous sx. Use of bedrail and increased time/effort 2/2 pain.    Transfers Overall transfer  level: Needs assistance Equipment used: None Transfers: Sit to/from [employer] Sit to Stand: Min guard Stand pivot transfers: Min assist       General transfer comment: Min guard for sit to stand from EOB and Min A for stand-pivot transfers 2/2 unsteadiness.    Balance Overall balance assessment: Needs assistance Sitting-balance support: Single extremity supported;Feet supported Sitting balance-Leahy Scale: Poor Sitting balance - Comments: Patient required at least unilateral UE support or external assist to maintain sitting balance at EOB. Postural control: Right lateral lean Standing balance support: Single extremity supported;During functional activity Standing balance-Leahy Scale: Poor Standing balance comment: Reliant on +1 HHA to maintain standing balance without AD.                           ADL either performed or assessed with clinical judgement   ADL Overall ADL's : Needs assistance/impaired     Grooming: Min guard;Standing           Upper Body Dressing : Min guard;Sitting Upper Body Dressing Details (indicate cue type and reason): Min guard for sitting balance. Lower Body Dressing: Minimal assistance;Sit to/from stand Lower Body Dressing Details (indicate cue type and reason): Min A for standing balance. Cues for upright posture. Toilet Transfer: Minimal Dentist Details (indicate cue type and reason): Min A for standing balance. +1 HHA.         Functional mobility during ADLs: Minimal assistance General ADL Comments: Min guard with AD and Min A without AD.     Vision Baseline Vision/History: No visual deficits Patient Visual Report:  (cataracts removed) Vision Assessment?: No apparent visual deficits     Perception     Praxis      Pertinent Vitals/Pain Pain Assessment: 0-10 Pain  Score: 10-Worst pain ever Pain Location: L shoulder and cervical neck Pain Descriptors / Indicators:  Constant;Grimacing;Sharp Pain Intervention(s): Monitored during session;Premedicated before session;Repositioned     Hand Dominance Right   Extremity/Trunk Assessment Upper Extremity Assessment Upper Extremity Assessment: RUE deficits/detail;LUE deficits/detail RUE Deficits / Details: AROM WFL. Did not exceed 90 degrees shoulder flexion/abduction to maintain cervical precautions. RUE Sensation: WNL RUE Coordination: WNL LUE Deficits / Details: AROM greatly limited 2/2 RA. LUE: Unable to fully assess due to pain LUE Sensation: WNL LUE Coordination: decreased fine motor   Lower Extremity Assessment Lower Extremity Assessment: Defer to PT evaluation   Cervical / Trunk Assessment Cervical / Trunk Assessment: Other exceptions Cervical / Trunk Exceptions: s/p cervical sx.   Communication Communication Communication: No difficulties   Cognition Arousal/Alertness: Awake/alert Behavior During Therapy:  (Tearful) Overall Cognitive Status: Impaired/Different from baseline Area of Impairment: Memory                     Memory: Decreased short-term memory         General Comments: Patient with difficulty recalling use of AE at home stating initially that she used a sock-aid and reacher but later denying having either at home.   General Comments  No dressing over cervical incision. Incision appears clean/dry. No active bleeding.    Exercises     Shoulder Instructions      Home Living Family/patient expects to be discharged to:: Private residence Living Arrangements: Spouse/significant other Available Help at Discharge: Family;Available 24 hours/day Type of Home: House Home Access: Stairs to enter [employer] of Steps: 1 from garage Entrance Stairs-Rails: Right Home Layout: Two level;Able to live on main level with bedroom/bathroom     Bathroom Shower/Tub: Walk-in shower;Tub/shower unit   Bathroom Toilet: Handicapped height     Home Equipment: Cane -  single point;Shower seat - built in;Grab bars - tub/shower;Grab bars - toilet;Walker - 4 wheels;Cane - quad;Hand held shower head          Prior Functioning/Environment Level of Independence: Independent with assistive device(s)        Comments: Occasional use of SPC. Patient notes "furniture walking" at home.        OT Problem List: Decreased strength;Decreased range of motion;Impaired balance (sitting and/or standing);Decreased coordination;Pain;Impaired UE functional use      OT Treatment/Interventions: Self-care/ADL training;Therapeutic exercise;Energy conservation;DME and/or AE instruction;Therapeutic activities;Patient/family education;Balance training    OT Goals(Current goals can be found in the care plan section) Acute Rehab OT Goals Patient Stated Goal: To return home. OT Goal Formulation: With patient Time For Goal Achievement: 02/28/20 Potential to Achieve Goals: Good ADL Goals Pt Will Perform Upper Body Dressing: with set-up;sitting Pt Will Perform Lower Body Dressing: with supervision;sit to/from stand Pt Will Transfer to Toilet: with supervision Pt Will Perform Toileting - Clothing Manipulation and hygiene: with supervision;sit to/from stand  OT Frequency: Min 2X/week   Barriers to D/C:            Co-evaluation              AM-PAC OT "6 Clicks" Daily Activity     Outcome Measure Help from another person eating meals?: A Little Help from another person taking care of personal grooming?: A Little Help from another person toileting, which includes using toliet, bedpan, or urinal?: A Little Help from another person bathing (including washing, rinsing, drying)?: A Little Help from another person to put on and taking off regular upper body clothing?: A Little Help from another  person to put on and taking off regular lower body clothing?: A Little 6 Click Score: 18   End of Session Equipment Utilized During Treatment: Gait belt Nurse Communication:  Mobility status  Activity Tolerance: Patient limited by pain Patient left: in bed;with call bell/phone within reach  OT Visit Diagnosis: Unsteadiness on feet (R26.81);Muscle weakness (generalized) (M62.81);Pain Pain - Right/Left: Left Pain - part of body: Shoulder (cervical neck)                Time: 7680-8811 OT Time Calculation (min): 25 min Charges:  OT General Charges $OT Visit: 1 Visit OT Evaluation $OT Eval Moderate Complexity: 1 Mod OT Treatments $Self Care/Home Management : 8-22 mins  Aften Lipsey H. OTR/L Supplemental OT, Department of rehab services [phone]  Serrena Linderman R H. 02/14/2020, 9:03 AM

## 2020-02-14 NOTE — Progress Notes (Signed)
Orthopedic Tech Progress Note Patient Details:  Michele Bryan 21-Jan-1941 [id number]  Ortho Devices Type of Ortho Device: Soft collar Ortho Device/Splint Location: NECK Ortho Device/Splint Interventions: Application,Adjustment   Post Interventions Patient Tolerated: Well Instructions Provided: Care of device   Donald Pore 02/14/2020, 11:43 AM

## 2020-02-14 NOTE — Anesthesia Postprocedure Evaluation (Signed)
Anesthesia Post Note  Patient: Michele Bryan  Procedure(s) Performed: Revision of cervical instrumented fusion with Occiput to Cervical Four posterior instrumented fusion (N/A Spine Cervical)     Patient location during evaluation: PACU Anesthesia Type: General Level of consciousness: awake and alert Pain management: pain level controlled Vital Signs Assessment: post-procedure vital signs reviewed and stable Respiratory status: spontaneous breathing, nonlabored ventilation, respiratory function stable and patient connected to nasal cannula oxygen Cardiovascular status: blood pressure returned to baseline and stable Postop Assessment: no apparent nausea or vomiting Anesthetic complications: no   No complications documented.  Last Vitals:  Vitals:   02/13/20 2100 02/13/20 2127  BP: (!) 136/91 134/80  Pulse: 77 83  Resp: (!) 21 18  Temp: 36.7 C 36.7 C  SpO2: 100% 100%                 Shelton Silvas

## 2020-02-14 NOTE — Progress Notes (Signed)
Neurosurgery Service Progress Note  Subjective: No acute events overnight, she doesn't really like to complain but with prompting she'll admit that she is having a lot of neck pain this morning, swallowing is dramatically better than preop  Objective: Vitals:   02/13/20 2127 02/14/20 0049 02/14/20 0452 02/14/20 0744  BP: 134/80 122/71 114/74 (!) 138/93  Pulse: 83 71 73 82  Resp: 18 18 18 18   Temp: 98.1 F (36.7 C) (!) 97.5 F (36.4 C) 97.7 F (36.5 C) 98.2 F (36.8 C)  TempSrc: Oral Oral Oral Oral  SpO2: 100% 98% 100% 100%  Weight:      Height:        Physical Exam: Improved chin on chest deformity but still appears globally 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 woman s/p revision of construct with O-5 posterior fusion, recovering well.  -increase oxycodone and breakthrough meds -cont bowel regimen -will get standing lateral xrays to get baseline and evaluate cervical alignment -discussed w/ pt and her husband. Her current alignment is the best I think we can do without putting further stress on her construct and she has already fractured around her screws once. Given that she can swallow again post-op, we can let the fracture heal and then revisit her alignment -SCDs/TEDs, SQH POD2  62  02/14/20 9:11 AM

## 2020-02-15 ENCOUNTER — Encounter (HOSPITAL_COMMUNITY): Payer: Self-pay | Admitting: Neurological Surgery

## 2020-02-15 ENCOUNTER — Inpatient Hospital Stay (HOSPITAL_COMMUNITY): Payer: Medicare PPO

## 2020-02-15 MED ORDER — FENTANYL CITRATE (PF) 100 MCG/2ML IJ SOLN
INTRAMUSCULAR | Status: AC
  Filled 2020-02-15: qty 2

## 2020-02-15 NOTE — Progress Notes (Signed)
Neurosurgery Service Progress Note  Subjective: No acute events overnight, she does feel like she has become kyphotic again, increased neck pain and significantly worsened posture  Objective: Vitals:   02/14/20 2323 02/15/20 0425 02/15/20 0744 02/15/20 0758  BP: 100/72 (!) 107/53 93/61 (!) 110/59  Pulse: 91 94 (!) 107 (!) 104  Resp: 18 18 18    Temp: 99.7 F (37.6 C) 99 F (37.2 C) 98.6 F (37 C)   TempSrc: Oral  Oral   SpO2: 98% 97% 96% 99%  Weight:      Height:        Physical Exam: Severe chin on chest deformity Strength 5/5x4, SILTx4 Incision c/d/i  Assessment & Plan: 79 y.o. woman s/p revision of construct with O-5 posterior fusion, POD1 XR with likely failure of inferior construct hardware with pullout of lateral mass screws.  -CT to confirm hardware failure and assess for any fractures -discussed w/ the pt and her husband, will unfortunately need operative revision tomorrow. NPO p MN, will extend the construct to T2 with CT guided pedicle screws and place a halo intra-operatively -SCDs/TEDs  62  02/15/20 9:10 AM

## 2020-02-15 NOTE — Progress Notes (Signed)
PT Cancellation Note  Patient Details Name: [name] MRN: [id number] DOB: 21-Feb-1941   Cancelled Treatment:    Reason Eval/Treat Not Completed: Medical issues which prohibited therapy Per RN, patient to return to OR tomorrow for further surgical intervention. PT will continue to follow acutely.   Lillia Pauls, PT, DPT Acute Rehabilitation Services Pager [phone] Office [phone]    Norval Morton 02/15/2020, 9:12 AM

## 2020-02-15 NOTE — Progress Notes (Signed)
OT Cancellation Note  Patient Details Name: Michele Bryan MRN: [id number] DOB: 12/15/1940   Cancelled Treatment:    Reason Eval/Treat Not Completed: Medical issues which prohibited therapy. Per RN, patient to return to OR tomorrow for further surgical intervention. OT will continue to follow acutely.   Kallie Edward OTR/L Supplemental OT, Department of rehab services [phone]  Destanae R H. 02/15/2020, 9:04 AM

## 2020-02-16 ENCOUNTER — Inpatient Hospital Stay (HOSPITAL_COMMUNITY): Payer: Medicare PPO | Admitting: Certified Registered Nurse Anesthetist

## 2020-02-16 ENCOUNTER — Encounter (HOSPITAL_COMMUNITY): Payer: Self-pay | Admitting: Neurological Surgery

## 2020-02-16 ENCOUNTER — Encounter (HOSPITAL_COMMUNITY)
Admission: RE | Disposition: A | Discharge status: Rehabilitation Facility | Payer: Self-pay | Source: Home / Self Care | Attending: Neurological Surgery

## 2020-02-16 ENCOUNTER — Inpatient Hospital Stay (HOSPITAL_COMMUNITY): Payer: Medicare PPO

## 2020-02-16 DIAGNOSIS — M960 Pseudarthrosis after fusion or arthrodesis: Secondary | ICD-10-CM | POA: Diagnosis not present

## 2020-02-16 HISTORY — PX: POSTERIOR CERVICAL FUSION/FORAMINOTOMY: SHX5038

## 2020-02-16 HISTORY — PX: HALO APPLICATION: SHX1720

## 2020-02-16 HISTORY — PX: APPLICATION OF INTRAOPERATIVE CT SCAN: SHX6668

## 2020-02-16 LAB — RENAL FUNCTION PANEL
Albumin: 2.7 g/dL — ABNORMAL LOW (ref 3.5–5.0)
Anion gap: 9 (ref 5–15)
BUN: 11 mg/dL (ref 8–23)
CO2: 25 mmol/L (ref 22–32)
Calcium: 8.5 mg/dL — ABNORMAL LOW (ref 8.9–10.3)
Chloride: 98 mmol/L (ref 98–111)
Creatinine, Ser: 0.78 mg/dL (ref 0.44–1.00)
GFR, Estimated: >60 mL/min (ref >60)
Glucose, Bld: 102 mg/dL — ABNORMAL HIGH (ref 70–99)
Phosphorus: 2.9 mg/dL (ref 2.5–4.6)
Potassium: 3.6 mmol/L (ref 3.5–5.1)
Sodium: 132 mmol/L — ABNORMAL LOW (ref 135–145)

## 2020-02-16 LAB — CBC
HCT: 28 % — ABNORMAL LOW (ref 36.0–46.0)
Hemoglobin: 8.5 g/dL — ABNORMAL LOW (ref 12.0–15.0)
MCH: 27.1 pg (ref 26.0–34.0)
MCHC: 30.4 g/dL (ref 30.0–36.0)
MCV: 89.2 fL (ref 80.0–100.0)
Platelets: 259 10*3/uL (ref 150–400)
RBC: 3.14 MIL/uL — ABNORMAL LOW (ref 3.87–5.11)
RDW: 13.6 % (ref 11.5–15.5)
WBC: 10.5 10*3/uL (ref 4.0–10.5)
nRBC: 0 % (ref 0.0–0.2)

## 2020-02-16 LAB — PREPARE RBC (CROSSMATCH)

## 2020-02-16 SURGERY — POSTERIOR CERVICAL FUSION/FORAMINOTOMY LEVEL 5
Anesthesia: General | Site: Spine Cervical

## 2020-02-16 MED ORDER — SODIUM CHLORIDE 0.9% IV SOLUTION: Freq: Once | INTRAVENOUS | Status: DC

## 2020-02-16 MED ORDER — EPHEDRINE SULFATE-NACL 50-0.9 MG/10ML-% IV SOSY
PREFILLED_SYRINGE | INTRAVENOUS | Status: DC | PRN
  Administered 2020-02-16: 15 mg via INTRAVENOUS

## 2020-02-16 MED ORDER — PROMETHAZINE HCL 25 MG/ML IJ SOLN: 6.2500 mg | INTRAMUSCULAR | Status: DC | PRN

## 2020-02-16 MED ORDER — LIDOCAINE 2% (20 MG/ML) 5 ML SYRINGE
INTRAMUSCULAR | Status: DC | PRN
  Administered 2020-02-16: 100 mg via INTRAVENOUS

## 2020-02-16 MED ORDER — THROMBIN 5000 UNITS EX SOLR: OROMUCOSAL | Status: DC | PRN

## 2020-02-16 MED ORDER — LIDOCAINE-EPINEPHRINE 1 %-1:100000 IJ SOLN
INTRAMUSCULAR | Status: AC
  Filled 2020-02-16: qty 1

## 2020-02-16 MED ORDER — CEFAZOLIN SODIUM-DEXTROSE 2-3 GM-%(50ML) IV SOLR
INTRAVENOUS | Status: DC | PRN
  Administered 2020-02-16 (×2): 2 g via INTRAVENOUS

## 2020-02-16 MED ORDER — ACETAMINOPHEN 500 MG PO TABS
1000.0000 mg | ORAL_TABLET | Freq: Once | ORAL | Status: AC
  Administered 2020-02-16: 1000 mg via ORAL
  Filled 2020-02-16: qty 2

## 2020-02-16 MED ORDER — SUCCINYLCHOLINE CHLORIDE 200 MG/10ML IV SOSY
PREFILLED_SYRINGE | INTRAVENOUS | Status: AC
  Filled 2020-02-16: qty 10

## 2020-02-16 MED ORDER — FENTANYL CITRATE (PF) 100 MCG/2ML IJ SOLN: 25.0000 ug | INTRAMUSCULAR | Status: DC | PRN

## 2020-02-16 MED ORDER — DEXAMETHASONE SODIUM PHOSPHATE 10 MG/ML IJ SOLN
INTRAMUSCULAR | Status: DC | PRN
  Administered 2020-02-16: 10 mg via INTRAVENOUS

## 2020-02-16 MED ORDER — BACITRACIN ZINC 500 UNIT/GM EX OINT
TOPICAL_OINTMENT | CUTANEOUS | Status: DC | PRN
  Administered 2020-02-16: 1 via TOPICAL

## 2020-02-16 MED ORDER — 0.9 % SODIUM CHLORIDE (POUR BTL) OPTIME
TOPICAL | Status: DC | PRN
  Administered 2020-02-16: 1000 mL

## 2020-02-16 MED ORDER — LIDOCAINE 2% (20 MG/ML) 5 ML SYRINGE
INTRAMUSCULAR | Status: AC
  Filled 2020-02-16: qty 5

## 2020-02-16 MED ORDER — ONDANSETRON HCL 4 MG/2ML IJ SOLN
INTRAMUSCULAR | Status: AC
  Filled 2020-02-16: qty 2

## 2020-02-16 MED ORDER — FENTANYL CITRATE (PF) 250 MCG/5ML IJ SOLN
INTRAMUSCULAR | Status: AC
  Filled 2020-02-16: qty 5

## 2020-02-16 MED ORDER — PROPOFOL 500 MG/50ML IV EMUL
INTRAVENOUS | Status: DC | PRN
  Administered 2020-02-16: 50 ug/kg/min via INTRAVENOUS

## 2020-02-16 MED ORDER — THROMBIN 5000 UNITS EX SOLR
CUTANEOUS | Status: AC
  Filled 2020-02-16: qty 5000

## 2020-02-16 MED ORDER — ONDANSETRON HCL 4 MG/2ML IJ SOLN
INTRAMUSCULAR | Status: DC | PRN
  Administered 2020-02-16: 4 mg via INTRAVENOUS

## 2020-02-16 MED ORDER — SODIUM CHLORIDE 0.9 % IV SOLN: INTRAVENOUS | Status: DC | PRN

## 2020-02-16 MED ORDER — PHENYLEPHRINE 40 MCG/ML (10ML) SYRINGE FOR IV PUSH (FOR BLOOD PRESSURE SUPPORT)
PREFILLED_SYRINGE | INTRAVENOUS | Status: AC
  Filled 2020-02-16: qty 10

## 2020-02-16 MED ORDER — PHENYLEPHRINE 40 MCG/ML (10ML) SYRINGE FOR IV PUSH (FOR BLOOD PRESSURE SUPPORT)
PREFILLED_SYRINGE | INTRAVENOUS | Status: DC | PRN
  Administered 2020-02-16: 120 ug via INTRAVENOUS
  Administered 2020-02-16 (×2): 200 ug via INTRAVENOUS
  Administered 2020-02-16: 120 ug via INTRAVENOUS

## 2020-02-16 MED ORDER — CHLORHEXIDINE GLUCONATE 0.12 % MT SOLN
OROMUCOSAL | Status: AC
  Administered 2020-02-16: 15 mL via OROMUCOSAL
  Filled 2020-02-16: qty 15

## 2020-02-16 MED ORDER — LIDOCAINE HCL (PF) 0.5 % IJ SOLN
INTRAMUSCULAR | Status: DC | PRN
  Administered 2020-02-16: 9 mL via INTRADERMAL

## 2020-02-16 MED ORDER — LIDOCAINE HCL (PF) 0.5 % IJ SOLN
INTRAMUSCULAR | Status: AC
  Filled 2020-02-16: qty 50

## 2020-02-16 MED ORDER — LACTATED RINGERS IV SOLN: INTRAVENOUS | Status: DC

## 2020-02-16 MED ORDER — FENTANYL CITRATE (PF) 250 MCG/5ML IJ SOLN
INTRAMUSCULAR | Status: DC | PRN
  Administered 2020-02-16 (×4): 50 ug via INTRAVENOUS
  Administered 2020-02-16: 25 ug via INTRAVENOUS
  Administered 2020-02-16 (×2): 50 ug via INTRAVENOUS
  Administered 2020-02-16: 25 ug via INTRAVENOUS

## 2020-02-16 MED ORDER — SUCCINYLCHOLINE CHLORIDE 200 MG/10ML IV SOSY
PREFILLED_SYRINGE | INTRAVENOUS | Status: DC | PRN
  Administered 2020-02-16: 140 mg via INTRAVENOUS

## 2020-02-16 MED ORDER — BACITRACIN ZINC 500 UNIT/GM EX OINT
TOPICAL_OINTMENT | CUTANEOUS | Status: AC
  Filled 2020-02-16: qty 28.35

## 2020-02-16 MED ORDER — CELECOXIB 200 MG PO CAPS
200.0000 mg | ORAL_CAPSULE | Freq: Once | ORAL | Status: AC
  Administered 2020-02-16: 200 mg via ORAL
  Filled 2020-02-16: qty 1

## 2020-02-16 MED ORDER — PROPOFOL 10 MG/ML IV BOLUS
INTRAVENOUS | Status: AC
  Filled 2020-02-16: qty 20

## 2020-02-16 MED ORDER — LIDOCAINE-EPINEPHRINE 1 %-1:100000 IJ SOLN
INTRAMUSCULAR | Status: DC | PRN
  Administered 2020-02-16: 6 mL

## 2020-02-16 MED ORDER — ROCURONIUM BROMIDE 10 MG/ML (PF) SYRINGE
PREFILLED_SYRINGE | INTRAVENOUS | Status: AC
  Filled 2020-02-16: qty 10

## 2020-02-16 MED ORDER — PHENYLEPHRINE HCL-NACL 10-0.9 MG/250ML-% IV SOLN
INTRAVENOUS | Status: DC | PRN
  Administered 2020-02-16: 75 ug/min via INTRAVENOUS

## 2020-02-16 MED ORDER — CEFAZOLIN SODIUM-DEXTROSE 2-4 GM/100ML-% IV SOLN
INTRAVENOUS | Status: AC
  Filled 2020-02-16: qty 100

## 2020-02-16 MED ORDER — PHENYLEPHRINE 40 MCG/ML (10ML) SYRINGE FOR IV PUSH (FOR BLOOD PRESSURE SUPPORT)
PREFILLED_SYRINGE | INTRAVENOUS | Status: AC
  Filled 2020-02-16: qty 20

## 2020-02-16 MED ORDER — ALBUMIN HUMAN 5 % IV SOLN: INTRAVENOUS | Status: DC | PRN

## 2020-02-16 MED ORDER — CHLORHEXIDINE GLUCONATE 0.12 % MT SOLN
15.0000 mL | Freq: Once | OROMUCOSAL | Status: AC
  Filled 2020-02-16: qty 15

## 2020-02-16 MED ORDER — DEXAMETHASONE SODIUM PHOSPHATE 10 MG/ML IJ SOLN
INTRAMUSCULAR | Status: AC
  Filled 2020-02-16: qty 1

## 2020-02-16 MED ORDER — PROPOFOL 10 MG/ML IV BOLUS
INTRAVENOUS | Status: DC | PRN
  Administered 2020-02-16: 140 mg via INTRAVENOUS

## 2020-02-16 MED ORDER — LACTATED RINGERS IV SOLN: INTRAVENOUS | Status: DC | PRN

## 2020-02-16 SURGICAL SUPPLY — 72 items
ADH SKN CLS APL DERMABOND .7 (GAUZE/BANDAGES/DRESSINGS) ×2
APL SKNCLS STERI-STRIP NONHPOA (GAUZE/BANDAGES/DRESSINGS)
BENZOIN TINCTURE PRP APPL 2/3 (GAUZE/BANDAGES/DRESSINGS) IMPLANT
BLADE CLIPPER SURG (BLADE) ×3 IMPLANT
BONE CANC CHIPS 20CC PCAN1/4 (Bone Implant) ×3 IMPLANT
BONE CANC CHIPS 40CC CAN1/2 (Bone Implant) ×3 IMPLANT
BUR MATCHSTICK NEURO 3.0 LAGG (BURR) IMPLANT
BUR PRECISION FLUTE 5.0 (BURR) ×3 IMPLANT
CANISTER SUCT 3000ML PPV (MISCELLANEOUS) ×3 IMPLANT
CHIPS CANC BONE 20CC PCAN1/4 (Bone Implant) ×2 IMPLANT
CHIPS CANC BONE 40CC CAN1/2 (Bone Implant) ×2 IMPLANT
CONN LAT G CLOSED 19 (Connector) ×6 IMPLANT
CONNECTOR LAT G CLOSED 19 (Connector) IMPLANT
COVER BACK TABLE 60X90IN (DRAPES) ×10 IMPLANT
DERMABOND ADVANCED (GAUZE/BANDAGES/DRESSINGS) ×1
DERMABOND ADVANCED .7 DNX12 (GAUZE/BANDAGES/DRESSINGS) ×2 IMPLANT
DRAPE C-ARM 35X43 STRL (DRAPES) ×2 IMPLANT
DRAPE C-ARM 42X72 X-RAY (DRAPES) ×7 IMPLANT
DRAPE LAPAROTOMY 100X72 PEDS (DRAPES) ×3 IMPLANT
DRAPE SCAN PATIENT (DRAPES) ×3 IMPLANT
DURAPREP 26ML APPLICATOR (WOUND CARE) ×1 IMPLANT
ELECT REM PT RETURN 9FT ADLT (ELECTROSURGICAL) ×3
ELECTRODE REM PT RTRN 9FT ADLT (ELECTROSURGICAL) ×2 IMPLANT
FEE INTRAOP MONITOR IMPULS NCS (MISCELLANEOUS) IMPLANT
GLOVE BIO SURGEON STRL SZ7 (GLOVE) ×3 IMPLANT
GLOVE BIO SURGEON STRL SZ8 (GLOVE) ×2 IMPLANT
GLOVE BIOGEL PI IND STRL 7.0 (GLOVE) IMPLANT
GLOVE BIOGEL PI IND STRL 7.5 (GLOVE) IMPLANT
GLOVE BIOGEL PI IND STRL 8 (GLOVE) IMPLANT
GLOVE BIOGEL PI INDICATOR 7.0 (GLOVE) ×5
GLOVE BIOGEL PI INDICATOR 7.5 (GLOVE) ×5
GLOVE BIOGEL PI INDICATOR 8 (GLOVE) ×3
GLOVE ECLIPSE 7.5 STRL STRAW (GLOVE) ×3 IMPLANT
GLOVE INDICATOR 8.5 STRL (GLOVE) ×2 IMPLANT
GOWN STRL REUS W/ TWL LRG LVL3 (GOWN DISPOSABLE) IMPLANT
GOWN STRL REUS W/ TWL XL LVL3 (GOWN DISPOSABLE) IMPLANT
GOWN STRL REUS W/TWL 2XL LVL3 (GOWN DISPOSABLE) ×1 IMPLANT
GOWN STRL REUS W/TWL LRG LVL3 (GOWN DISPOSABLE) ×15
GOWN STRL REUS W/TWL XL LVL3 (GOWN DISPOSABLE) ×9
GRAFT BNE CANC CHIPS 1-8 20CC (Bone Implant) IMPLANT
GRAFT BNE CHIP CANC 1-8 40 (Bone Implant) IMPLANT
HEMOSTAT POWDER KIT SURGIFOAM (HEMOSTASIS) ×3 IMPLANT
INTRAOP MONITOR FEE IMPULS NCS (MISCELLANEOUS) ×2
INTRAOP MONITOR FEE IMPULSE (MISCELLANEOUS) ×3
KIT BASIN OR (CUSTOM PROCEDURE TRAY) ×3 IMPLANT
KIT TURNOVER KIT B (KITS) ×6 IMPLANT
MARKER SPHERE PSV REFLC 13MM (MARKER) ×16 IMPLANT
NDL HYPO 25X1 1.5 SAFETY (NEEDLE) ×2 IMPLANT
NEEDLE HYPO 22GX1.5 SAFETY (NEEDLE) ×4 IMPLANT
NEEDLE HYPO 25X1 1.5 SAFETY (NEEDLE) ×3 IMPLANT
NS IRRIG 1000ML POUR BTL (IV SOLUTION) ×3 IMPLANT
PACK LAMINECTOMY NEURO (CUSTOM PROCEDURE TRAY) ×3 IMPLANT
PIN MAYFIELD SKULL DISP (PIN) ×3 IMPLANT
ROD PRE CVD 3.5X200 (Rod) IMPLANT
ROD STD 3.5X240 (Rod) ×2 IMPLANT
ROD TRANS G XLINK LG (Rod) ×1 IMPLANT
SCREW MA G XLINK 4.5X20 (Screw) ×1 IMPLANT
SCREW MA INFINITY 3.5X12 (Screw) ×1 IMPLANT
SCREW MA PED IFIX 4.5X18 (Screw) ×1 IMPLANT
SCREW MULTI AXIAL 4.5X22 (Screw) ×2 IMPLANT
SCREW MULTI AXIAL 4.5X24 (Screw) ×2 IMPLANT
SET SCREW INFINITY IFIX THOR (Screw) ×14 IMPLANT
SUT ETHILON 3 0 FSL (SUTURE) IMPLANT
SUT MNCRL AB 3-0 PS2 18 (SUTURE) ×3 IMPLANT
SUT MON AB 3-0 SH 27 (SUTURE)
SUT MON AB 3-0 SH27 (SUTURE) ×2 IMPLANT
SUT VIC AB 0 CT1 18XCR BRD8 (SUTURE) ×2 IMPLANT
SUT VIC AB 0 CT1 8-18 (SUTURE) ×9
SUT VIC AB 2-0 CP2 18 (SUTURE) ×6 IMPLANT
TOWEL GREEN STERILE (TOWEL DISPOSABLE) ×3 IMPLANT
TOWEL GREEN STERILE FF (TOWEL DISPOSABLE) ×3 IMPLANT
WATER STERILE IRR 1000ML POUR (IV SOLUTION) ×3 IMPLANT

## 2020-02-16 NOTE — Progress Notes (Signed)
Patient is transferred from room 3C07 to OR at this time. CHG bath done. Patient verbalized understanding about procedure. Patient will sign consent with MD. Report given to nurse Sam, RN with all questions answered. Left unit via bed with husband and all belongings at side.

## 2020-02-16 NOTE — Anesthesia Procedure Notes (Signed)
Procedure Name: Intubation Date/Time: 02/16/2020 1:41 PM Performed by: Rosiland Oz, CRNA Pre-anesthesia Checklist: Patient identified, Emergency Drugs available, Suction available, Patient being monitored and Timeout performed Patient Re-evaluated:Patient Re-evaluated prior to induction Oxygen Delivery Method: Circle system utilized Preoxygenation: Pre-oxygenation with 100% oxygen Induction Type: IV induction Laryngoscope Size: Glidescope and 3 Grade View: Grade I Tube type: Oral Tube size: 7.0 mm Number of attempts: 1 Airway Equipment and Method: Stylet and Video-laryngoscopy Placement Confirmation: ETT inserted through vocal cords under direct vision,  positive ETCO2 and breath sounds checked- equal and bilateral Secured at: 21 cm Tube secured with: Tape

## 2020-02-16 NOTE — Progress Notes (Signed)
OT Cancellation Note  Patient Details Name: Michele Bryan MRN: [id number] DOB: April 30, 1940   Cancelled Treatment:    Reason Eval/Treat Not Completed: Patient at procedure or test/ unavailable. OT will continue to follow.   Kallie Edward OTR/L Supplemental OT, Department of rehab services [phone]  [name] R H. 02/16/2020, 12:27 PM

## 2020-02-16 NOTE — Anesthesia Preprocedure Evaluation (Addendum)
Anesthesia Evaluation  Patient identified by MRN, date of birth, ID band Patient awake    Reviewed: Allergy & Precautions, NPO status , Patient's Chart, lab work & pertinent test results  History of Anesthesia Complications (+) PONV and history of anesthetic complications  Airway Mallampati: IV     Mouth opening: Limited Mouth Opening  Dental no notable dental hx. (+) Dental Advisory Given   Pulmonary neg pulmonary ROS,    Pulmonary exam normal        Cardiovascular hypertension, Pt. on medications Normal cardiovascular exam     Neuro/Psych MS  Neuromuscular disease negative psych ROS   GI/Hepatic Neg liver ROS, GERD  Medicated,  Endo/Other  negative endocrine ROS  Renal/GU negative Renal ROS     Musculoskeletal  (+) Arthritis , Rheumatoid disorders,    Abdominal   Peds  Hematology  (+) Blood dyscrasia, anemia ,   Anesthesia Other Findings Day of surgery medications reviewed with the patient.  Reproductive/Obstetrics                            Anesthesia Physical  Anesthesia Plan  ASA: III  Anesthesia Plan: General   Post-op Pain Management:    Induction: Intravenous  PONV Risk Score and Plan: 4 or greater and Dexamethasone, Ondansetron, Propofol infusion and Treatment may vary due to age or medical condition  Airway Management Planned: Oral ETT and Video Laryngoscope Planned  Additional Equipment:   Intra-op Plan:   Post-operative Plan: Extubation in OR  Informed Consent: I have reviewed the patients History and Physical, chart, labs and discussed the procedure including the risks, benefits and alternatives for the proposed anesthesia with the patient or authorized representative who has indicated his/her understanding and acceptance.     Dental advisory given  Plan Discussed with: Anesthesiologist and CRNA  Anesthesia Plan Comments: ( )       Anesthesia Quick  Evaluation

## 2020-02-16 NOTE — Op Note (Signed)
PATIENT: Michele Bryan  DAY OF SURGERY: 02/16/20   PRE-OPERATIVE DIAGNOSIS:  Unstable cervical spine fracture   POST-OPERATIVE DIAGNOSIS:  Same   PROCEDURE:  Revision of occipitocervical fusion, extension of fusion from occiput to T2, attempted open reduction of C1-2 fracture, use of intraop CT with navigation, application of halo frame and vest   SURGEON:  Surgeon(s) and Role:    Jadene Pierini, MD - Primary    Dawley, Kendell Bane, DO - Assisting   ANESTHESIA: ETGA   BRIEF HISTORY: This is a 79 year old woman in whom early this week I performed an O-C4 instrumented fusion for a failed C1-2 construct. On POD1, her xrays were concerning for hardware pullout inferiorly and her kyphotic deformity returned. I therefore recommended revision of the construct, extension to the thoracic spine, and application of a halo frame. This was discussed with the patient as well as risks, benefits, and alternatives and wished to proceed with surgery.   OPERATIVE DETAIL: The patient was taken to the operating room and placed on the OR table in the prone position. A formal time out was performed with two patient identifiers and confirmed the operative site.   Pre-flip and post-flip SSEPs and MEPs were stable. Her prior midline cervical incision was opened and extended inferiorly. The rods were removed and all of the lateral mass screws were removed without tension, the lateral masses were fractured down to C5 bilaterally. We placed a brainlab clamp inferiorly and performed an intra-op CT. Given the complexity of the case, I asked Dr. Jake Samples to scrub in and assist as well as help with the reduction. Using frameless stereotaxy, I placed pedicle screws bilaterally at C7, T1, and T2. Given that her lateral masses were fractured, I used an image-guided awl to cannulate the pedicles on the left at T3 and T4. But on palpation they did not have a lateral wall so I did not think that screw placement was safe. I did not want  to violate the pedicles at those levels on the other side, so I did not attempt to cannulate the right T3 and T4 pedicles.  I attempted to use navigation to replace or redirect the lateral mass screws, but the facets were too fractured to hold a screw. I was able to put a rescue 4.84mm screw on the left at C6, otherwise it was not successful.   Dr. Jake Samples scrubbed out and attempted to place the head in some distraction with the [place] to reduce the fracture. MEPs were repeated throughout. We could not get it to reduce without significant force and therefore aborted further attempts. He was able to get good alignment of the hard palate with respect to C2 and C3, so we locked the [place] in that position. The bone surfaces were then diffusely decorticated from the occiput to T2. Rods were contoured to be tension free, then secured with caps. The wound was copiously irrigated multiple times, then cortical allograft chips were placed on the entirety of the fusion surfaces.   All instrument and sponge counts were correct, the incision was then closed in layers. The patient was then carefully placed prone on a gurney and the head was placed in a halo ring followed by vest placement and securing the two components together. Pin sites and connections were torqued using the provided torque wrench to indicated specifications at each connection and at the pin sites.   The patient was then returned to anesthesia for emergence. No apparent complications at the completion of  the procedure.   EBL:    DRAINS: none   SPECIMENS: none   Jadene Pierini, MD 02/16/20 9:30 PM

## 2020-02-16 NOTE — Transfer of Care (Signed)
Immediate Anesthesia Transfer of Care Note  Patient: Michele Bryan  Procedure(s) Performed: Occiput to Thoracic Two Posterior Cervical Fusion with AIRO and Application of Halo (N/A Spine Cervical) APPLICATION OF INTRAOPERATIVE CT SCAN (N/A ) HALO TRACTION APPLICATION (N/A )  Patient Location: PACU  Anesthesia Type:General  Level of Consciousness: drowsy  Airway & Oxygen Therapy: Patient Spontanous Breathing and Patient connected to nasal cannula oxygen  Post-op Assessment: Report given to RN, Post -op Vital signs reviewed and stable and Patient moving all extremities  Post vital signs: Reviewed and stable  Last Vitals:  Vitals Value Taken Time  BP 117/70 02/16/20 2150  Temp    Pulse 75 02/16/20 2158  Resp 14 02/16/20 2158  SpO2 97 % 02/16/20 2158  Vitals shown include unvalidated device data.  Last Pain:  Vitals:   02/16/20 1136  TempSrc: Oral  PainSc:       Patients Stated Pain Goal: 3 (02/16/20 1308)  Complications: No complications documented.

## 2020-02-16 NOTE — Progress Notes (Addendum)
Neurosurgery Service Progress Note  Subjective: No acute events overnight, no new complaints  Objective: Vitals:   02/15/20 2333 02/16/20 0346 02/16/20 0606 02/16/20 0746  BP: 108/61 (!) 97/48 111/76 106/88  Pulse: 86 82 77 78  Resp: 18 18 18 16   Temp: 99.1 F (37.3 C) 98.5 F (36.9 C) 97.9 F (36.6 C) 97.9 F (36.6 C)  TempSrc: Oral Oral Oral Oral  SpO2: 99% 95% 98% 99%  Weight:      Height:        Physical Exam: Severe chin on chest deformity Strength 5/5x4, SILTx4 Incision c/d/i  Assessment & Plan: 79 y.o. woman s/p revision of construct with O-5 posterior fusion, POD1 XR with likely failure of inferior construct hardware with pullout of lateral mass screws. CT C-spine with hardware pullout, kyphotic deformity  -OR today for revision / extension of hardware and halo placement  62  02/16/20 9:14 AM

## 2020-02-16 NOTE — Progress Notes (Signed)
PT Cancellation Note  Patient Details Name: [name] MCCAUL MRN: [id number] DOB: 1940-11-17   Cancelled Treatment:    Reason Eval/Treat Not Completed: Patient at procedure or test/unavailable. Pt back to OR with neurosurgery. PT will follow up after surgery once pt is medically appropriate to mobilize.   Arlyss Gandy 02/16/2020, 3:46 PM

## 2020-02-16 NOTE — Brief Op Note (Signed)
02/16/2020  9:30 PM  PATIENT:  Michele Bryan  80 y.o. female  PRE-OPERATIVE DIAGNOSIS:  Closed Cervical fracture  POST-OPERATIVE DIAGNOSIS:  Closed Cervical fracture  PROCEDURE:  Procedure(s): Occiput to Thoracic Two Posterior Cervical Fusion with AIRO and Application of Halo (N/A) APPLICATION OF INTRAOPERATIVE CT SCAN (N/A) HALO TRACTION APPLICATION (N/A)  SURGEON:  Surgeon(s) and Role:    * Montana Fassnacht, Clovis Pu, MD - Primary    * Dawley, Alan Mulder, DO - Assisting  PHYSICIAN ASSISTANT:   ANESTHESIA:   general  EBL:  700 mL   BLOOD ADMINISTERED:none  DRAINS: none   LOCAL MEDICATIONS USED:  LIDOCAINE   SPECIMEN:  No Specimen  DISPOSITION OF SPECIMEN:  N/A  COUNTS:  YES  TOURNIQUET:  * No tourniquets in log *  DICTATION: .Note written in EPIC  PLAN OF CARE: Admit to inpatient   PATIENT DISPOSITION:  PACU - hemodynamically stable.   Delay start of Pharmacological VTE agent (>24hrs) due to surgical blood loss or risk of bleeding: yes

## 2020-02-16 NOTE — Anesthesia Postprocedure Evaluation (Signed)
Anesthesia Post Note  Patient: Michele Bryan  Procedure(s) Performed: Occiput to Thoracic Two Posterior Cervical Fusion with AIRO and Application of Halo (N/A Spine Cervical) APPLICATION OF INTRAOPERATIVE CT SCAN (N/A ) HALO TRACTION APPLICATION (N/A )     Patient location during evaluation: PACU Anesthesia Type: General Level of consciousness: awake and patient cooperative Pain management: pain level controlled Vital Signs Assessment: post-procedure vital signs reviewed and stable Respiratory status: spontaneous breathing, nonlabored ventilation, respiratory function stable and patient connected to nasal cannula oxygen Cardiovascular status: blood pressure returned to baseline and stable Postop Assessment: no apparent nausea or vomiting Anesthetic complications: no Comments: R. Arm weakness noted   No complications documented.  Last Vitals:  Vitals:   02/16/20 1136 02/16/20 2150  BP: 109/64 117/70  Pulse: 78 79  Resp: 16 11  Temp: 37.1 C (!) 36.1 C  SpO2: 98% 100%    Last Pain:  Vitals:   02/16/20 1136  TempSrc: Oral  PainSc:                  [name] COKER

## 2020-02-17 ENCOUNTER — Inpatient Hospital Stay (HOSPITAL_COMMUNITY): Payer: Medicare PPO

## 2020-02-17 ENCOUNTER — Encounter (HOSPITAL_COMMUNITY): Payer: Self-pay | Admitting: Neurological Surgery

## 2020-02-17 LAB — TYPE AND SCREEN
ABO/RH(D): O POS
Antibody Screen: NEGATIVE
Unit division: 0
Unit division: 0

## 2020-02-17 LAB — BPAM RBC
Blood Product Expiration Date: 202201172359
Blood Product Expiration Date: 202201172359
ISSUE DATE / TIME: 202112161713
ISSUE DATE / TIME: 202112161925
Unit Type and Rh: 5100
Unit Type and Rh: 5100

## 2020-02-17 LAB — CBC
HCT: 30.8 % — ABNORMAL LOW (ref 36.0–46.0)
Hemoglobin: 10.1 g/dL — ABNORMAL LOW (ref 12.0–15.0)
MCH: 27.7 pg (ref 26.0–34.0)
MCHC: 32.8 g/dL (ref 30.0–36.0)
MCV: 84.6 fL (ref 80.0–100.0)
Platelets: 245 10*3/uL (ref 150–400)
RBC: 3.64 MIL/uL — ABNORMAL LOW (ref 3.87–5.11)
RDW: 13.6 % (ref 11.5–15.5)
WBC: 12 10*3/uL — ABNORMAL HIGH (ref 4.0–10.5)
nRBC: 0 % (ref 0.0–0.2)

## 2020-02-17 LAB — RENAL FUNCTION PANEL
Albumin: 2.8 g/dL — ABNORMAL LOW (ref 3.5–5.0)
Anion gap: 10 (ref 5–15)
BUN: 13 mg/dL (ref 8–23)
CO2: 24 mmol/L (ref 22–32)
Calcium: 8.6 mg/dL — ABNORMAL LOW (ref 8.9–10.3)
Chloride: 106 mmol/L (ref 98–111)
Creatinine, Ser: 0.66 mg/dL (ref 0.44–1.00)
GFR, Estimated: >60 mL/min (ref >60)
Glucose, Bld: 105 mg/dL — ABNORMAL HIGH (ref 70–99)
Phosphorus: 3.4 mg/dL (ref 2.5–4.6)
Potassium: 4.2 mmol/L (ref 3.5–5.1)
Sodium: 140 mmol/L (ref 135–145)

## 2020-02-17 MED ORDER — BACITRACIN ZINC 500 UNIT/GM EX OINT
TOPICAL_OINTMENT | Freq: Two times a day (BID) | CUTANEOUS | Status: DC
  Filled 2020-02-17 (×2): qty 28.4

## 2020-02-17 NOTE — Plan of Care (Signed)
  Problem: Education: Goal: Ability to verbalize activity precautions or restrictions will improve Outcome: Progressing Goal: Understanding of discharge needs will improve Outcome: Progressing   Problem: Activity: Goal: Ability to avoid complications of mobility impairment will improve Outcome: Progressing Goal: Will remain free from falls Outcome: Progressing   Problem: Clinical Measurements: Goal: Ability to maintain clinical measurements within normal limits will improve Outcome: Progressing   Problem: Pain Management: Goal: Pain level will decrease Outcome: Progressing   Problem: Skin Integrity: Goal: Will show signs of wound healing Outcome: Progressing

## 2020-02-17 NOTE — Progress Notes (Signed)
Patient arrived to unit via bed, Alert and oriented x3 doesn't remember what time of year it is. Forgetful. Telemetry applied.  Son and Husband at bedside.

## 2020-02-17 NOTE — Progress Notes (Signed)
Physical Therapy Treatment Patient Details Name: Michele Bryan MRN: [id number] DOB: [birth year] Today's Date: 02/17/2020    History of Present Illness 79 y.o. female presenting with unstable C1-2 fx. with deformity s/p revision and C1-C5 posterior instrumented fusion. PMHx significant for fx. s/p C1-2 fusion, HLD, HTN, MS, peripheral neuropathy and RA. Patient s/p revision of occipitocervical fusion, extension of fusion from occiput to T2, application of halo frame and vest on 12/16.    PT Comments    Patient currently requires frequent cueing and redirecting due to impaired cognition. Patient disoriented to place this session. Patient required minA for supine>sit and min guard for sit>supine, cues for log roll technique. Patient sat EOB >15 minutes to improve activity tolerance with weight of Halo. Initially trialed RW for OOB mobility, however patient with increased pain in L shoulder due to previous scapula fx, trialed HHAx1. Patient demos balance and coordination deficits during short ambulation. When instructed to take steps fwd, patient took sidesteps towards foot of bed, unable to change direction with cueing. Educated patient and husband about Halo vest and effects on balance and endurance and CIR recommendation. Patient continues to be limited by pain, impaired balance, generalized weakness, decreased activity tolerance, and impaired functional mobility. Continue to recommend comprehensive inpatient rehab (CIR) for post-acute therapy needs.    Follow Up Recommendations  CIR;Supervision/Assistance - 24 hour     Equipment Recommendations  None recommended by PT    Recommendations for Other Services Rehab consult     Precautions / Restrictions Precautions Precautions: Cervical;Fall Precaution Booklet Issued: Yes (comment) (previously given to patient) Precaution Comments: Patient able to recall cervical precautions. Required Braces or Orthoses: Other Brace  (Halo/Vest) Restrictions Weight Bearing Restrictions: No    Mobility  Bed Mobility Overal bed mobility: Needs Assistance Bed Mobility: Supine to Sit;Sit to Supine     Supine to sit: Min assist Sit to supine: Min guard   General bed mobility comments: cues for log roll technique with good follow through for supine>sit but sit>supine patient unable to follow directions. minA required for trunk advancement to EOB  Transfers Overall transfer level: Needs assistance Equipment used: Rolling Siyah Mault (2 wheeled);1 person hand held assist Transfers: Sit to/from Stand Sit to Stand: Min guard         General transfer comment: initially used RW, however increased pain in L shoulder. Trialed HHAx1 however demos unsteadiness and hesitancy, discussed trialing SPC or hemiwalker next session  Ambulation/Gait Ambulation/Gait assistance: Min guard;Min assist Gait Distance (Feet): 4 Feet Assistive device: 1 person hand held assist Gait Pattern/deviations: Step-to pattern;Decreased stride length Gait velocity: Decreased   General Gait Details: Instructed patient to ambulate fwd, however patient began sidestepping towards foot of bed. Unable to change direction with cues. Instructed to sidestep towards [facility] with patient having difficulty processing and sequencing. minA required at times for balance and guidance.   Stairs             Wheelchair Mobility    Modified Rankin (Stroke Patients Only)       Balance Overall balance assessment: Needs assistance Sitting-balance support: Single extremity supported;Feet supported Sitting balance-Leahy Scale: Poor Sitting balance - Comments: Patient sat EOB >15 minutes with close supervision and unilateral UE supoprt. Initially required minA to maintain sitting balance due to first time sitting up with Halo Postural control: Right lateral lean Standing balance support: Single extremity supported;During functional activity Standing balance-Leahy  Scale: Poor Standing balance comment: Reliant on UE support  Cognition Arousal/Alertness: Awake/alert Behavior During Therapy: WFL for tasks assessed/performed Overall Cognitive Status: Impaired/Different from baseline Area of Impairment: Orientation;Memory;Following commands;Problem solving                 Orientation Level: Disoriented to;Place   Memory: Decreased short-term memory Following Commands: Follows one step commands inconsistently     Problem Solving: Slow processing;Difficulty sequencing;Requires verbal cues;Requires tactile cues General Comments: Patient states she was at [facility], reoriented to patient. Patient with difficulty following directions and sequencing ambulation      Exercises      General Comments General comments (skin integrity, edema, etc.): Husband present throughout. Educated patient and husband about effects of Halo on balance and endurance. Educated patient and husband on CIR recommendation and need for assistance right now.      Pertinent Vitals/Pain Pain Assessment: 0-10 Pain Score: 6  Pain Location: L shoulder and cervical neck Pain Descriptors / Indicators: Constant;Grimacing;Sharp;Operative site guarding Pain Intervention(s): Limited activity within patient's tolerance;Monitored during session    Home Living                      Prior Function            PT Goals (current goals can now be found in the care plan section) Acute Rehab PT Goals Patient Stated Goal: to go home PT Goal Formulation: With patient/family Time For Goal Achievement: 02/21/20 Potential to Achieve Goals: Good Progress towards PT goals: Not progressing toward goals - comment (increased confusion, weakness, and balance deficits)    Frequency    Min 5X/week      PT Plan Discharge plan needs to be updated    Co-evaluation              AM-PAC PT "6 Clicks" Mobility   Outcome  Measure  Help needed turning from your back to your side while in a flat bed without using bedrails?: A Little Help needed moving from lying on your back to sitting on the side of a flat bed without using bedrails?: A Little Help needed moving to and from a bed to a chair (including a wheelchair)?: A Little Help needed standing up from a chair using your arms (e.g., wheelchair or bedside chair)?: A Little Help needed to walk in hospital room?: A Little Help needed climbing 3-5 steps with a railing? : A Lot 6 Click Score: 17    End of Session Equipment Utilized During Treatment: Gait belt Activity Tolerance: Patient tolerated treatment well Patient left: in bed;with call bell/phone within reach;with family/visitor present Nurse Communication: Mobility status PT Visit Diagnosis: Unsteadiness on feet (R26.81);Pain;Other symptoms and signs involving the nervous system (R29.898) Pain - part of body:  (neck, shoulders (L worse than R)     Time: 4742-5956 PT Time Calculation (min) (ACUTE ONLY): 47 min  Charges:  $Therapeutic Activity: 38-52 mins                     Gregor Hams, PT, DPT Acute Rehabilitation Services Pager [phone] Office [phone]    Michele Bryan 02/17/2020, 2:27 PM

## 2020-02-17 NOTE — Progress Notes (Signed)
Patient to xray.

## 2020-02-17 NOTE — Progress Notes (Signed)
Occupational Therapy Treatment Patient Details Name: Michele Bryan MRN: [id number] DOB: 11-Dec-1940 Today's Date: 02/17/2020    History of present illness 79 y.o. female presenting with unstable C1-2 fx. with deformity s/p revision and C1-C5 posterior instrumented fusion. PMHx significant for fx. s/p C1-2 fusion, HLD, HTN, MS, peripheral neuropathy and RA. Patient s/p revision of occipitocervical fusion, extension of fusion from occiput to T2, application of halo frame and vest on 12/16.   OT comments  Pt now with Halo vest and required increased assist with ADLs/selfcare tasks as well as ADL mobility. Pt with some confusion and hallucinations, but very pleasant and cooperative. Pt required min A to sit EOB using log roll technique, min/mod A with SPTs HHA to [facility] for toileting tasks total A clothing mgt and min guard A with anterior hygiene. Pt;s husband very supportive. OT will continue to follow acutely to maximize level of function and safety  Follow Up Recommendations  CIR;Supervision/Assistance - 24 hour    Equipment Recommendations   (TBD at next venue of care)    Recommendations for Other Services  Rehab Consult    Precautions / Restrictions Precautions Precautions: Cervical;Fall Precaution Booklet Issued: Yes (comment) (previously given to patient) Precaution Comments: Patient able to recall cervical precautions. Required Braces or Orthoses: Other Brace (Halo) Cervical Brace: Other (comment) Other Brace: Halo Vest Restrictions Weight Bearing Restrictions: No       Mobility Bed Mobility Overal bed mobility: Needs Assistance Bed Mobility: Supine to Sit;Sit to Supine     Supine to sit: Min assist Sit to supine: Min assist   General bed mobility comments: cues for log roll technique with good follow through for supine>sit but sit>supine patient unable to follow directions. min A required for trunk and LE  advancement to EOB and LEs back onto bed  Transfers Overall  transfer level: Needs assistance Equipment used: Rolling walker (2 wheeled);1 person hand held assist Transfers: Sit to/from Stand Sit to Stand: Mod/Min assist Stand pivot transfers: Min assist       General transfer comment: initially used RW, however increased pain in L shoulder, SPT  HHAx1 however demos unsteadiness to [facility]    Balance Overall balance assessment: Needs assistance Sitting-balance support: Single extremity supported;Feet supported Sitting balance-Leahy Scale: Poor Sitting balance - Comments: Patient sat EOB >10 minutes with close supervision and unilateral UE supoprt. Initially required minA to maintain sitting balance due to first time sitting up with Halo Postural control: Right lateral lean Standing balance support: Single extremity supported;During functional activity Standing balance-Leahy Scale: Poor Standing balance comment: Reliant on UE support                           ADL either performed or assessed with clinical judgement   ADL Overall ADL's : Needs assistance/impaired     Grooming: Min guard;Wash/dry hands;Wash/dry face;Sitting   Upper Body Bathing: Maximal assistance   Lower Body Bathing: Maximal assistance   Upper Body Dressing : Total assistance   Lower Body Dressing: Total assistance   Toilet Transfer: Minimal assistance;Cueing for safety;Cueing for sequencing;BSC   Toileting- Clothing Manipulation and Hygiene: Total assistance;Sitting/lateral lean Toileting - Clothing Manipulation Details (indicate cue type and reason): total A with clothing mgt, min guard A with anterior hygiene seated on BSC     Functional mobility during ADLs: Minimal assistance;Cueing for safety;Cueing for sequencing       Vision Baseline Vision/History: No visual deficits Patient Visual Report: No change from baseline  Perception     Praxis      Cognition Arousal/Alertness: Awake/alert Behavior During Therapy: WFL for tasks  assessed/performed Overall Cognitive Status: Impaired/Different from baseline Area of Impairment: Orientation;Memory;Following commands;Problem solving                 Orientation Level: Disoriented to;Place   Memory: Decreased short-term memory Following Commands: Follows one step commands inconsistently     Problem Solving: Slow processing;Difficulty sequencing;Requires verbal cues;Requires tactile cues General Comments: Patient states she was at [facility], reoriented to patient. Patient with difficulty following directions and sequencing ambulation        Exercises     Shoulder Instructions       General Comments Husband present throughout. Educated patient and husband about effects of Halo on balance and endurance. Educated patient and husband on CIR recommendation and need for assistance right now.    Pertinent Vitals/ Pain       Pain Assessment: Faces Pain Score: 6  Faces Pain Scale: Hurts little more Pain Location: L shoulder and cervical neck Pain Descriptors / Indicators: Grimacing;Operative site guarding;Sore Pain Intervention(s): Limited activity within patient's tolerance;Monitored during session;Repositioned  Home Living                                          Prior Functioning/Environment              Frequency  Min 2X/week        Progress Toward Goals  OT Goals(current goals can now be found in the care plan section)  Progress towards OT goals: Progressing toward goals  Acute Rehab OT Goals Patient Stated Goal: to go home OT Goal Formulation: With patient/family ADL Goals Pt Will Perform Grooming: with set-up;with supervision;sitting;standing Pt Will Perform Upper Body Bathing: with min assist;sitting Pt Will Perform Lower Body Bathing: with mod assist;sitting/lateral leans;with adaptive equipment Pt Will Perform Upper Body Dressing: with min assist Pt Will Perform Lower Body Dressing: with max assist;with  adaptive equipment Pt Will Transfer to Toilet: with min guard assist;bedside commode;ambulating;grab bars Pt Will Perform Toileting - Clothing Manipulation and hygiene: with mod assist;sit to/from stand  Plan Discharge plan remains appropriate    Co-evaluation                 AM-PAC OT "6 Clicks" Daily Activity     Outcome Measure   Help from another person eating meals?: A Little   Help from another person toileting, which includes using toliet, bedpan, or urinal?: A Lot Help from another person bathing (including washing, rinsing, drying)?: A Lot Help from another person to put on and taking off regular upper body clothing?: A Lot Help from another person to put on and taking off regular lower body clothing?: Total 6 Click Score: 10    End of Session Equipment Utilized During Treatment: Gait belt;Other (comment) (BSC)  OT Visit Diagnosis: Unsteadiness on feet (R26.81);Muscle weakness (generalized) (M62.81);Pain Pain - Right/Left: Left Pain - part of body: Shoulder   Activity Tolerance Patient tolerated treatment well;Patient limited by fatigue   Patient Left in bed;with call bell/phone within reach;with family/visitor present   Nurse Communication Mobility status        Time: 9201-0071 OT Time Calculation (min): 47 min  Charges: OT General Charges $OT Visit: 1 Visit OT Treatments $Self Care/Home Management : 23-37 mins $Therapeutic Activity: 8-22 mins     Karleen Hampshire,  Ronie Spies 02/17/2020, 4:38 PM

## 2020-02-17 NOTE — Plan of Care (Signed)

## 2020-02-17 NOTE — TOC Initial Note (Signed)
Transition of Care Michele Bryan) - Initial/Assessment Note    Patient Details  Name: Michele Bryan MRN: [id number] Date of Birth: 01-26-1941  Transition of Care Michele Bryan) CM/SW Contact:    Michele Labrum, RN Phone Number: 02/17/2020, 1:08 PM  Clinical Narrative:                 Case management met with the patient and husband regarding transitions of care S/P revision of posterior fusion - patient will remain in a Halo for the next 6 weeks per the husband.  The patient is awake, alert and oriented to her surroundings but uncertain of being in the Bryan - fluctuating orientation at this point.  She is pleasant and answers questions without difficulty at this time and is waiting on PT/OT evaluation.  The patient is full vaccinated for COVID and the husband states that he is hoping the patient would be a candidate for CIR at [facility] after PT evaluation.  The patient has a rollator, shower bench and bars, wheelchair, and bedside commode at home but will need a regular rolling walker most likely.  The patient and husband would prefer recommendation for CIR.  They have had home health services in the past but not currently and do not have a preference if the patient discharges home with home health.  Will continue to follow the patient for discharge needs and transitions of care - preferably CIR.  Expected Discharge Plan: IP Rehab Facility Barriers to Discharge: Continued Medical Work up   Patient Goals and CMS Choice Patient states their goals for this hospitalization and ongoing recovery are:: Patient would like CIR placement. CMS Medicare.gov Compare Post Acute Care list provided to:: Patient Represenative (must comment) (Patient and husband Michele Bryan)) Choice offered to / list presented to : Michele Bryan  Expected Discharge Plan and Services Expected Discharge Plan: [place]   Discharge Planning Services: CM Consult Post Acute Care Choice: IP Rehab Living arrangements  for the past 2 months: Single Family Home (bedroom is on main level)                                      Prior Living Arrangements/Services Living arrangements for the past 2 months: Single Family Home (bedroom is on main level) Lives with:: Spouse Patient language and need for interpreter reviewed:: Yes Do you feel safe going back to the place where you live?: Yes      Need for Family Participation in Patient Care: Yes (Comment) Care giver support system in place?: Yes (comment) Current home services: DME (currently has rollator, shower bench inside shower, bedside commode, wheelchair - will need RW most likely) Criminal Activity/Legal Involvement Pertinent to Current Situation/Hospitalization: No - Comment as needed  Activities of Daily Living Home Assistive Devices/Equipment: Cane (specify quad or straight),Walker (specify type) ADL Screening (condition at time of admission) Patient's cognitive ability adequate to safely complete daily activities?: Yes Is the patient deaf or have difficulty hearing?: No Does the patient have difficulty seeing, even when wearing glasses/contacts?: No Does the patient have difficulty concentrating, remembering, or making decisions?: Yes Patient able to express need for assistance with ADLs?: Yes Does the patient have difficulty dressing or bathing?: Yes Independently performs ADLs?: No Communication: Independent Dressing (OT): Needs assistance Is this a change from baseline?: Change from baseline, expected to last <3days Grooming: Needs assistance Is this a change from baseline?: Change from baseline, expected to  last <3 days Feeding: Independent Bathing: Needs assistance Is this a change from baseline?: Change from baseline, expected to last <3 days Toileting: Needs assistance Is this a change from baseline?: Change from baseline, expected to last <3 days In/Out Bed: Needs assistance Is this a change from baseline?: Change from  baseline, expected to last <3 days Does the patient have difficulty walking or climbing stairs?: Yes Weakness of Legs: Both Weakness of Arms/Hands: Left  Permission Sought/Granted Permission sought to share information with : Case Manager Permission granted to share information with : Yes, Verbal Permission Granted     Permission granted to share info w AGENCY: CIR or Rehabilitation facility  Permission granted to share info w Relationship: Michele Bryan, husband - [phone]     Emotional Assessment Appearance:: Appears stated age Attitude/Demeanor/Rapport: Engaged Affect (typically observed): Accepting Orientation: : Oriented to Self,Fluctuating Orientation (Suspected and/or reported Sundowners),Oriented to  Time,Oriented to Situation Alcohol / Substance Use: Not Applicable Psych Involvement: No (comment)  Admission diagnosis:  Closed cervical spine fracture ([place]) [S12.9XXA] Patient Active Problem List   Diagnosis Date Noted  . Closed cervical spine fracture ([place]) 12/06/2019  . Scapula fracture 03/05/2018  . Chronic left shoulder pain 11/25/2017  . Left cervical radiculopathy 09/29/2017  . 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 High risk medication use 05/06/2016  . Primary osteoarthritis of both hands 05/06/2016  . Primary osteoarthritis of left knee 05/06/2016  . Primary osteoarthritis of both feet 05/06/2016  . Dyslipidemia 05/06/2016  . Peripheral neuropathy 05/06/2016  . Diverticulosis of intestine without bleeding 05/06/2016  . Osteopenia  05/06/2016  . Vitamin D deficiency 05/06/2016  . 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 History of GI bleed 05/06/2016  . Cataract of both eyes 05/06/2016  . Urinary urgency 04/30/2015  . Chronic constipation 04/30/2015  . Multiple sclerosis ([place]) 03/23/2013  . Expected blood loss anemia 06/16/2012  . S/P  right TKA 06/15/2012   PCP:  Michele Lass, MD Pharmacy:   [facility] # [id number] [place] [address] [place] [address] Phone: [phone] Fax: [place], [place] [address] Phone: [phone] Fax: [phone]     Social Determinants of Health (SDOH) Interventions    Readmission Risk Interventions Readmission Risk Prevention Plan 02/17/2020  Post Dischage Appt Complete  Medication Screening Complete  Transportation Screening Complete  Some recent data might be hidden

## 2020-02-17 NOTE — Progress Notes (Signed)
Patient still disoriented, very forgetful, when asked appropriate questions patient remembers that Dr. Maurice Small did her neck surgery and that its near Christmas however still not oriented to date and location.

## 2020-02-17 NOTE — Progress Notes (Signed)
Neurosurgery Service Progress Note  Subjective: No acute events overnight, this morning she was having some visual hallucinations (thought she was in church) but knew they weren't real  Objective: Vitals:   02/16/20 2245 02/16/20 2300 02/16/20 2340 02/17/20 0300  BP: 124/73 130/76 137/80 109/81  Pulse: 76 79 78 84  Resp: 19 (!) 23 18 17   Temp:   98 F (36.7 C) 97.8 F (36.6 C)  TempSrc:   Oral Axillary  SpO2: 98% 95% 100% 99%  Weight:      Height:        Physical Exam: Good alignment, in halo vest Strength 5/5x4, SILTx4, pain limited at left shoulder Incision c/d/i  Assessment & Plan: 79 y.o. woman s/p revision of construct with O-5 posterior fusion, POD1 XR with likely failure of inferior construct hardware with pullout of lateral mass screws. CT C-spine with hardware pullout, kyphotic deformity, 12/16 revision of hardware with O-T2 PSIF, 12/17 XR C-spine good position  -will need halo pins re-torqued tomorrow morning -pt has emergency allen key taped to chest piece, keep on at all times -activity as tolerated in halo -post-op delirium, should resolve, she is calm and doing well, will avoid Rx at this time that could further cloud the picture -can pad the L shoulder in the halo if pt has a lot of pain when she sits up -bacitracin bid to pin sites -cbc, rfp -d/c foley -PT/OT, [facility] tomorrow  [facility]  02/17/20 8:33 AM

## 2020-02-18 MED ORDER — HEPARIN SODIUM (PORCINE) 5000 UNIT/ML IJ SOLN
5000.0000 [IU] | Freq: Three times a day (TID) | INTRAMUSCULAR | Status: DC
Start: 2020-02-18 — End: 2020-02-21
  Administered 2020-02-18 – 2020-02-21 (×9): 5000 [IU] via SUBCUTANEOUS
  Filled 2020-02-18 (×10): qty 1

## 2020-02-18 NOTE — Progress Notes (Signed)
Inpatient Rehab Admissions:  Inpatient Rehab Consult received.  I met with patient at the bedside for rehabilitation assessment and to discuss goals and expectations of an inpatient rehab admission.  Daughter-in-law also present.  Pt would like to discuss CIR with husband and son prior to making decision on whether to pursue CIR.  Will follow up at later date.  Signed: Gayland Curry, [place], [place] Admissions Coordinator [phone]

## 2020-02-18 NOTE — Progress Notes (Signed)
Providing Compassionate, Quality Care - Together   Subjective: Patient reports headache. She denies numbness, tingling, or weakness of her upper or lower extremities.  Objective: Vital signs in last 24 hours: Temp:  [98.4 F (36.9 C)-99.5 F (37.5 C)] 99.5 F (37.5 C) (12/18 0802) Pulse Rate:  [78-94] 78 (12/18 0802) Resp:  [16-18] 18 (12/18 0802) BP: (134-158)/(72-96) 158/96 (12/18 0802) SpO2:  [96 %-100 %] 98 % (12/18 0802)  Intake/Output from previous day: 12/17 0701 - 12/18 0700 In: 367.7 [P.O.:340; I.V.:27.7] Out: 1200 [Urine:1200] Intake/Output this shift: No intake/output data recorded.  Patient is alert and pleasant MAE, strength and sensation intact Halo in place with good alignment  Lab Results: Recent Labs    02/16/20 0939 02/17/20 0900  WBC 10.5 12.0*  HGB 8.5* 10.1*  HCT 28.0* 30.8*  PLT 259 245   BMET Recent Labs    02/16/20 0939 02/17/20 0900  NA 132* 140  K 3.6 4.2  CL 98 106  CO2 25 24  GLUCOSE 102* 105*  BUN 11 13  CREATININE 0.78 0.66  CALCIUM 8.5* 8.6*    Studies/Results: DG Cervical Spine 2 or 3 views  Result Date: 02/17/2020 CLINICAL DATA:  Cervical spine fracture with postoperative fixation EXAM: CERVICAL SPINE - 2-3 VIEW COMPARISON:  Cervical spine CT February 15, 2020 and intraoperative cervical spine images February 16, 2020 FINDINGS: Frontal and lateral views were obtained. There is screw and plate fixation at C2 transfixing a displaced odontoid fracture. Odontoid remains displaced anteriorly and somewhat inferiorly with respect to the body of C2. There is postoperative fixation from the occiput through the upper thoracic region, unchanged. No new fracture evident. There is stable 3 mm of anterolisthesis of C5 on C6. Moderate disc space narrowing at C5-6 and C6-7 noted. IMPRESSION: Postoperative fixation through a displaced odontoid fracture. Appearance stable compared to intraoperative images. The odontoid is displaced  anteriorly and inferiorly with respect to the body of C2, unchanged. Screws extend into this area. No new fracture. Spondylolisthesis noted at C5-6. Postoperative fixation posteriorly is noted from the occiput through the upper thoracic region. Electronically Signed   By: Bretta Bang III M.D.   On: 02/17/2020 08:14   DG Cervical Spine 2 or 3 views  Result Date: 02/16/2020 CLINICAL DATA:  Elective surgery EXAM: CERVICAL SPINE - 2-3 VIEW; DG C-ARM 1-60 MIN COMPARISON:  CT 02/15/2020 FINDINGS: Three low resolution intraoperative spot views of the cervical spine. Total fluoroscopy time was 38 seconds. Extensive spinal rods and fixating screws from the occiput to the visible cervical spine. Displaced type 2 dens fracture. IMPRESSION: Intraoperative fluoroscopic assistance provided during cervical spine surgery. Electronically Signed   By: Jasmine Pang M.D.   On: 02/16/2020 20:05   DG C-Arm 1-60 Min  Result Date: 02/16/2020 CLINICAL DATA:  Elective surgery EXAM: CERVICAL SPINE - 2-3 VIEW; DG C-ARM 1-60 MIN COMPARISON:  CT 02/15/2020 FINDINGS: Three low resolution intraoperative spot views of the cervical spine. Total fluoroscopy time was 38 seconds. Extensive spinal rods and fixating screws from the occiput to the visible cervical spine. Displaced type 2 dens fracture. IMPRESSION: Intraoperative fluoroscopic assistance provided during cervical spine surgery. Electronically Signed   By: Jasmine Pang M.D.   On: 02/16/2020 20:05    Assessment/Plan: 79 y.o. woman s/p revision of construct with O-C5 posterior fusion, On POD1 XR demonstrated likely failure of hardware, with pullout of lateral mass screws. CT C-spine with hardware pullout, kyphotic deformity. On 12/16, she underwent revision of hardware with O-T2 PSIF,  12/17 XR shows C-spine good position   LOS: 5 days   -Dr. Maurice Small to re-torque halo pins tomorrow morning -Activity as tolerated in halo -SQ heparin started per Dr. Marcy Siren  02/17/2020 note   Val Eagle, DNP, AGNP-C Nurse Practitioner  [facility] [address], [place], [place] [address] P: [phone]    F: [phone]  02/18/2020, 12:01 PM

## 2020-02-19 NOTE — Progress Notes (Signed)
Inpatient Rehab Admissions Coordinator:  Met with pt and husband, [name] at bedside. Explained CIR goals and expectations. Both acknowledged understanding.  Both interested in pt pursuing CIR.  Pt appears to be an appropriate candidate for potential admission.  Will continue to follow.   Gayland Curry, [place], [place] Admissions Coordinator [phone]

## 2020-02-19 NOTE — PMR Pre-admission (Signed)
PMR Admission Coordinator Pre-Admission Assessment  Patient: Michele Bryan is an 79 y.o., female MRN: [id number] DOB: 10/15/1940 Height: _0  (160 cm) Weight: 48.3 kg  Insurance Information HMO:     PPO: yes     PCP:      IPA:      80/20:      OTHER:  PRIMARY: [employer]      Policy#: [id number]      Subscriber: patient CM Name: Lattie Haw    Phone#: [phone], ext. [phone].  Approval begins 02/21/20  Weekly updates of clinicals requested every 7 days to [place] ext [phone].     Fax#: [phone] Pre-Cert#: [id number]      Employer:  Benefits:  Phone #: n/a- online at [email or web address]     Name:  Eff. Date: 03/04/19-03/02/20     Deduct: does not have for in-network providers      Out of Pocket Max: $3,300 [id number] met)      Life Max: NA CIR: $125/day co-pay for days 1-10      SNF: $0 co-pay days 1-20; $50 co-pay days 21-100 Outpatient: $20/visit co-pay pending service     Co-Pay:  Home Health: 100% coverage, 0% co-insurance; excludes personal in-home care      Co-Pay:  DME: 80% coverage     Co-Pay: 20% Providers: in-network  SECONDARY:       Policy#:      Phone#:   Development worker, community:       Phone#:   The Engineer, petroleum" for patients in Inpatient Rehabilitation Facilities with attached "Privacy Act [place] Records" was provided and verbally reviewed with: Patient  Emergency Contact Information Contact Information    Name Relation Home Work [address]   Jiles Harold   [phone]   Jak[place], [place] [phone]  [phone]      Current Medical History  Patient Admitting Diagnosis: closed cervical spine fx; s/p revision of occipitocervical fusion, extension of fusion from occiput to T2  History of Present Illness: 79 year old female with a history of fracture s/p C1-2 posterior instrumented fusion. Presented on 12/13 with recurrence of her chin on chest deformity with worsening pain and new dysphagia. CT confirmed fracture of the bone around her  C2 screws. She presented for revision of her hardware and extension of her instrumented fusion. Patient also with history of arthritis, GERD, HTN, multiple sclerosis, peripheral neuropathy and vitamin D deficiency.   Underwent occiput to C 5 posterior instrumented fusion, revision of prior C 1-2 fusion on 02/13/2020.Postoperatively CT confirmed failure of inferior construct hardware will pullout of lateral mass screws, kyphotic deformity. On 12/16 underwent revision of occipitocervical fusion, extension of fusion form occiput to T2, attempted open reduction of C 1-2 fracture, use of intraop CT with navigation, application of halo frame and vest.  postoperatively delirium resolved. Halo pins re-torqued on 12/17 by Dr. Zada Finders. Heparin SQ for DVT prophylaxis, bacitracin to pin sites with [facility] emergency key taped to chest at all times. Postop pain management with med changes as needed. Plans to transition the fosamax to forteo per Dr. Bo Merino recommendation.  Patient's medical record from [facility] has been reviewed by the rehabilitation admission coordinator and physician.  Past Medical History  Past Medical History:  Diagnosis Date  . Anemia   . Arthritis   . GERD (gastroesophageal reflux disease)   . History of blood transfusion   . Hyperlipidemia   . Hypertension   . MS (multiple sclerosis) ([place])   . Neuromuscular disorder (  [place])    multiple scleroosis/peripheral neuropathy  . PONV (postoperative nausea and vomiting)   . Rheumatoid arthritis ([place])   . Ulcer   . Vitamin D deficiency     Family History   family history includes Heart attack in her father.  Prior Rehab/Hospitalizations Has the patient had prior rehab or hospitalizations prior to admission? Yes  Has the patient had major surgery during 100 days prior to admission? Yes   Current Medications  Current Facility-Administered Medications:  .  0.9 %  sodium chloride infusion (Manually program via  Guardrails IV Fluids), , Intravenous, Once, Ostergard, Thomas A, MD .  0.9 %  sodium chloride infusion, 250 mL, Intravenous, Continuous, Ostergard, Thomas A, MD .  acetaminophen (TYLENOL) tablet 650 mg, 650 mg, Oral, Q4H, 650 mg at 02/21/20 0955 **OR** [DISCONTINUED] acetaminophen (TYLENOL) suppository 650 mg, 650 mg, Rectal, Q4H PRN, Judith Part, MD .  amLODipine (NORVASC) tablet 5 mg, 5 mg, Oral, Daily, Ostergard, Joyice Faster, MD, 5 mg at 02/21/20 0955 .  bacitracin ointment, , Topical, BID, Judith Part, MD, Given at 02/21/20 [phone] .  cholecalciferol (VITAMIN D3) tablet 5,000 Units, 5,000 Units, Oral, Daily, Judith Part, MD, 5,000 Units at 02/21/20 0955 .  cyclobenzaprine (FLEXERIL) tablet 10 mg, 10 mg, Oral, TID PRN, Judith Part, MD, 10 mg at 02/21/20 0955 .  docusate sodium (COLACE) capsule 100 mg, 100 mg, Oral, BID, Judith Part, MD, 100 mg at 02/21/20 0955 .  gabapentin (NEURONTIN) capsule 100 mg, 100 mg, Oral, TID, Judith Part, MD, 100 mg at 02/21/20 0955 .  heparin injection 5,000 Units, 5,000 Units, Subcutaneous, Q8H, Bergman, Meghan D, NP, 5,000 Units at 02/21/20 0403 .  HYDROmorphone (DILAUDID) injection 1 mg, 1 mg, Intravenous, Q3H PRN, Judith Part, MD, 1 mg at 02/21/20 0958 .  lactated ringers infusion, , Intravenous, Continuous, Ostergard, Joyice Faster, MD, Last Rate: 10 mL/hr at 02/17/20 0805, Infusion Verify at 02/17/20 0805 .  lidocaine (LIDODERM) 5 % 1 patch, 1 patch, Transdermal, Q24H, Ostergard, Joyice Faster, MD, 1 patch at 02/20/20 1621 .  losartan (COZAAR) tablet 50 mg, 50 mg, Oral, BID, Judith Part, MD, 50 mg at 02/21/20 0955 .  menthol-cetylpyridinium (CEPACOL) lozenge 3 mg, 1 lozenge, Oral, PRN **OR** phenol (CHLORASEPTIC) mouth spray 1 spray, 1 spray, Mouth/Throat, PRN, Ostergard, Joyice Faster, MD .  multivitamin with minerals tablet 1 tablet, 1 tablet, Oral, Once per day on Mon Wed Fri, Ostergard, Thomas A, MD, 1 tablet at  02/20/20 1835 .  ondansetron (ZOFRAN) tablet 4 mg, 4 mg, Oral, Q6H PRN **OR** ondansetron (ZOFRAN) injection 4 mg, 4 mg, Intravenous, Q6H PRN, Judith Part, MD, 4 mg at 02/17/20 0027 .  oxyCODONE (Oxy IR/ROXICODONE) immediate release tablet 10 mg, 10 mg, Oral, Q4H PRN, Judith Part, MD, 10 mg at 02/17/20 0023 .  oxyCODONE (Oxy IR/ROXICODONE) immediate release tablet 15 mg, 15 mg, Oral, Q4H PRN, Judith Part, MD, 15 mg at 02/21/20 0403 .  polyethylene glycol (MIRALAX / GLYCOLAX) packet 17 g, 17 g, Oral, Daily PRN, Judith Part, MD, 17 g at 02/18/20 2121 .  sodium chloride flush (NS) 0.9 % injection 3 mL, 3 mL, Intravenous, Q12H, Ostergard, Joyice Faster, MD, 3 mL at 02/21/20 0953 .  sodium chloride flush (NS) 0.9 % injection 3 mL, 3 mL, Intravenous, PRN, Judith Part, MD .  zolpidem (AMBIEN) tablet 5 mg, 5 mg, Oral, QHS PRN, Judith Part, MD, 5 mg at 02/20/20 2127  Patients Current Diet:  Diet Order            Diet regular Room service appropriate? Yes; Fluid consistency: Thin  Diet effective now                 Precautions / Restrictions Precautions Precautions: Cervical,Fall Precaution Booklet Issued: Yes (comment) Precaution Comments: Patient able to recall cervical precautions. Cervical Brace: Other (comment) Other Brace: Halo Restrictions Weight Bearing Restrictions: No   Has the patient had 2 or more falls or a fall with injury in the past year? Yes  Prior Activity Level Household: leaves house 1x/week or less  Prior Functional Level Self Care: Did the patient need help bathing, dressing, using the toilet or eating? Independent  Indoor Mobility: Did the patient need assistance with walking from room to room (with or without device)? Independent  Stairs: Did the patient need assistance with internal or external stairs (with or without device)? Independent  Functional Cognition: Did the patient need help planning regular tasks such as  shopping or remembering to take medications? [place] Devices/Equipment: Cane (specify quad or straight),Walker (specify type) Home Equipment: Cane - single point,Shower seat - built in,Grab bars - tub/shower,Grab bars - toilet,Cane - quad,Hand held shower head,Walker - 2 wheels  Prior Device Use: Indicate devices/aids used by the patient prior to current illness, exacerbation or injury? used cane or rollator occassionally  Current Functional Level Cognition  Overall Cognitive Status: Impaired/Different from baseline Orientation Level: Oriented X4 Following Commands: Follows one step commands with increased time Safety/Judgement: Decreased awareness of safety,Decreased awareness of deficits General Comments: A&OX4 today. Difficulty recalling precautions, increased time and cues for problem solving    Extremity Assessment (includes Sensation/Coordination)  Upper Extremity Assessment: RUE deficits/detail,LUE deficits/detail RUE Deficits / Details: AROM WFL. Did not exceed 90 degrees shoulder flexion/abduction to maintain cervical precautions. RUE Sensation: WNL RUE Coordination: WNL LUE Deficits / Details: AROM greatly limited 2/2 RA. LUE: Unable to fully assess due to pain LUE Sensation: WNL LUE Coordination: decreased fine motor  Lower Extremity Assessment: Defer to PT evaluation    ADLs  Overall ADL's : Needs assistance/impaired Grooming: Minimal assistance,Standing,Oral care Grooming Details (indicate cue type and reason): Min A for brushing teeth standing at sink to maintain balance due to posterior bias, cues for cervical precautions and use of rinse cup. Increasing assist for balance when reaching to sink Upper Body Bathing: Maximal assistance Lower Body Bathing: Maximal assistance Upper Body Dressing : Total assistance Upper Body Dressing Details (indicate cue type and reason): Min guard for sitting balance. Lower Body  Dressing: Total assistance Lower Body Dressing Details (indicate cue type and reason): Min A for standing balance. Cues for upright posture. Toilet Transfer: Minimal assistance,+2 for safety/equipment,Stand-pivot,BSC Toilet Transfer Details (indicate cue type and reason): Min A x 2 with handheld assist Toileting- Clothing Manipulation and Hygiene: Moderate assistance,Sitting/lateral lean Toileting - Clothing Manipulation Details (indicate cue type and reason): Able to demo anterior peri care after urination. Will need increased assist for clothing mgmt Functional mobility during ADLs: Minimal assistance,+2 for physical assistance,+2 for safety/equipment General ADL Comments: Posterior bias during standing tasks and poor balance increasing fall risk during ADLs/mobility    Mobility  Overal bed mobility: Needs Assistance Bed Mobility: Supine to Sit,Sit to Supine Rolling: Supervision Sidelying to sit: Min guard Supine to sit: Min assist,HOB elevated Sit to supine: Min assist General bed mobility comments: Min A to sit EOB with assistance to scoot hips forward  EOB. Min A for safe negotiation of B LE back into bed with cues for keeping spine straight    Transfers  Overall transfer level: Needs assistance Equipment used: 2 person hand held assist Transfers: Sit to/from [employer] Sit to Stand: Min assist Stand pivot transfers: Min assist,+2 safety/equipment General transfer comment: Min A for safety in sit to stands from [facility] as L LE extends out with posterior bias during transitional movement. Cues for hand placement and safe sequencing    Ambulation / Gait / Stairs / Wheelchair Mobility  Ambulation/Gait Ambulation/Gait assistance: Min assist,+2 safety/equipment Gait Distance (Feet): 10 Feet Assistive device: 2 person hand held assist Gait Pattern/deviations: Step-to pattern,Decreased stride length,Wide base of support General Gait Details: Patient with hesitant gait  pattern due overthinking, instructed patient to not overthink and just walk normally, intermittent follow through. Patient required step by step cues for approaching bed to sit, cues for hand placement and sequencing Gait velocity: Decreased Gait velocity interpretation: <1.31 ft/sec, indicative of household ambulator    Posture / Balance Dynamic Sitting Balance Sitting balance - Comments: difficulty gaining balance, needs UE support to prevent LOB Balance Overall balance assessment: Needs assistance Sitting-balance support: Single extremity supported,Feet supported Sitting balance-Leahy Scale: Poor Sitting balance - Comments: difficulty gaining balance, needs UE support to prevent LOB Postural control: Right lateral lean Standing balance support: Single extremity supported,During functional activity,Bilateral upper extremity supported Standing balance-Leahy Scale: Poor Standing balance comment: Reliant on UE support    Special needs/care consideration  Skin surgical incision: neck; cervical, Halo frame and vest and Designated visitor Pamelyn Bancroft, husband Emergency allen key for halo taped to chest of halo at all times   Previous Home Environment  Living Arrangements: Spouse/significant other  Lives With: Spouse Available Help at Discharge: Family,Available 24 hours/day Type of Home: House Home Layout: Two level,Able to live on main level with bedroom/bathroom Home Access: Stairs to enter Entrance Stairs-Rails: Right Entrance Stairs-Number of Steps: 1 Bathroom Shower/Tub: Multimedia programmer: Handicapped height Bathroom Accessibility: Yes How Accessible: Accessible via walker,Accessible via wheelchair [place]: No  Discharge Living Setting Plans for Discharge Living Setting: Patient's home Type of Home at Discharge: House Discharge Home Layout: Two level,Able to live on main level with bedroom/bathroom Discharge Home Access: Stairs to enter Entrance  Stairs-Number of Steps: 1 Discharge Bathroom Shower/Tub: Walk-in shower Discharge Bathroom Toilet: Handicapped height Discharge Bathroom Accessibility: Yes How Accessible: Accessible via walker,Accessible via wheelchair Does the patient have any problems obtaining your medications?: No  Social/Family/Support Systems Patient Roles: Spouse Anticipated Caregiver: Adajah Cocking, husband Anticipated Caregiver's Contact Information: [phone] Caregiver Availability: 24/7 Is Caregiver In Agreement with Plan?: Yes Does Caregiver/Family have Issues with Lodging/Transportation while Pt is in Rehab?: No  Goals Patient/Family Goal for Rehab: Mod I-Supervision: PT/OT Expected length of stay: 14-16 days Pt/Family Agrees to Admission and willing to participate: Yes Program Orientation Provided & Reviewed with Pt/Caregiver Including Roles  & Responsibilities: Yes  Decrease burden of Care through IP rehab admission: NA  Possible need for SNF placement upon discharge: NA  Patient Condition: I have reviewed medical records from [facility] , spoken with  patient and family member, daughter in Sports coach. I met with patient at the bedside for inpatient rehabilitation assessment.  Patient will benefit from ongoing PT and OT, can actively participate in 3 hours of therapy a day 5 days of the week, and can make measurable gains during the admission.  Patient will also benefit from the coordinated team approach  during an Inpatient Acute Rehabilitation admission.  The patient will receive intensive therapy as well as Rehabilitation physician, nursing, social worker, and care management interventions.  Due to bladder management, bowel management, safety, skin/wound care, disease management, medication administration, pain management and patient education the patient requires 24 hour a day rehabilitation nursing.  The patient is currently min assist overall with mobility and basic ADLs.  Discharge setting and  therapy post discharge at home with home health is anticipated.  Patient has agreed to participate in the Acute Inpatient Rehabilitation Program and will admit today.  Preadmission Screen Completed By:  Cleatrice Burke, 02/21/2020 11:01 AM ______________________________________________________________________   Discussed status with Dr. Dagoberto Ligas on 02/21/2020 at 1105 and received approval for admission today.  Admission Coordinator:  Cleatrice Burke, RN, time  [id number] Date  02/21/2020   Assessment/Plan: Diagnosis: 1. Does the need for close, 24 hr/day Medical supervision in concert with the patient's rehab needs make it unreasonable for this patient to be served in a less intensive setting? Yes 2. Co-Morbidities requiring supervision/potential complications: unstable X0/9 fx, HALO and vest; HTN, MS, RA, peripehral neuropathy, needs forteo 3. Due to bladder management, bowel management, safety, skin/wound care, disease management, medication administration, pain management and patient education, does the patient require 24 hr/day rehab nursing? Yes 4. Does the patient require coordinated care of a physician, rehab nurse, PT, OT, and SLP to address physical and functional deficits in the context of the above medical diagnosis(es)? Yes Addressing deficits in the following areas: balance, endurance, locomotion, strength, transferring, bathing, dressing, feeding, grooming and toileting 5. Can the patient actively participate in an intensive therapy program of at least 3 hrs of therapy 5 days a week? Yes 6. The potential for patient to make measurable gains while on inpatient rehab is good 7. Anticipated functional outcomes upon discharge from inpatient rehab: modified independent and supervision PT, modified independent and supervision OT, n/a SLP 8. Estimated rehab length of stay to reach the above functional goals is: 14-16 days 9. Anticipated discharge destination: Home 10. Overall  Rehab/Functional Prognosis: good   MD Signature:

## 2020-02-19 NOTE — Progress Notes (Addendum)
Neurosurgery Service Progress Note  Subjective: No acute events overnight, doing well today  Objective: Vitals:   02/18/20 2006 02/19/20 0100 02/19/20 0348 02/19/20 0742  BP: (!) 155/94 (!) 156/89 (!) 147/91 117/64  Pulse: 91 95 95 97  Resp: 16 16 16 17   Temp: 98.7 F (37.1 C) 98.6 F (37 C) 97.9 F (36.6 C) 98.2 F (36.8 C)  TempSrc: Oral Oral Oral Oral  SpO2: 96% 98% 98% 99%  Weight:      Height:        Physical Exam: Good alignment, in halo vest Strength 5/5x4, SILTx4 Incision c/d/i  Assessment & Plan: 79 y.o. woman s/p revision of construct with O-5 posterior fusion, POD1 XR with likely failure of inferior construct hardware with pullout of lateral mass screws. CT C-spine with hardware pullout, kyphotic deformity, 12/16 revision of hardware with O-T2 PSIF, 12/17 XR C-spine good position, 12/19 halo pins re-torqued  -pt has emergency allen key taped to chest piece, keep on at all times -activity as tolerated in halo -bacitracin bid to pin sites -PT/OT, SQH -d/c tele -CIR pending  1/20  02/19/20 10:25 AM

## 2020-02-19 NOTE — Plan of Care (Signed)
  Problem: Education: Goal: Knowledge of General Education information will improve Description: Including pain rating scale, medication(s)/side effects and non-pharmacologic comfort measures Outcome: Progressing   Problem: Health Behavior/Discharge Planning: Goal: Ability to manage health-related needs will improve Outcome: Progressing   Problem: Clinical Measurements: Goal: Will remain free from infection Outcome: Progressing   Problem: Activity: Goal: Risk for activity intolerance will decrease Outcome: Progressing   Problem: Nutrition: Goal: Adequate nutrition will be maintained Outcome: Progressing   Problem: Coping: Goal: Level of anxiety will decrease Outcome: Progressing   Problem: Elimination: Goal: Will not experience complications related to bowel motility Outcome: Progressing   Problem: Pain Managment: Goal: General experience of comfort will improve Outcome: Progressing   Problem: Safety: Goal: Ability to remain free from injury will improve Outcome: Progressing   

## 2020-02-20 ENCOUNTER — Telehealth: Payer: Self-pay | Admitting: Pharmacist

## 2020-02-20 ENCOUNTER — Telehealth: Payer: Self-pay | Admitting: Rheumatology

## 2020-02-20 LAB — VITAMIN D 25 HYDROXY (VIT D DEFICIENCY, FRACTURES): Vit D, 25-Hydroxy: 30.65 ng/mL (ref 30–100)

## 2020-02-20 MED ORDER — ACETAMINOPHEN 325 MG PO TABS
650.0000 mg | ORAL_TABLET | ORAL | Status: DC
  Administered 2020-02-20 – 2020-02-21 (×6): 650 mg via ORAL
  Filled 2020-02-20 (×6): qty 2

## 2020-02-20 MED ORDER — LIDOCAINE 5 % EX PTCH
1.0000 | MEDICATED_PATCH | CUTANEOUS | Status: DC
  Administered 2020-02-20 – 2020-02-21 (×2): 1 via TRANSDERMAL
  Filled 2020-02-20 (×2): qty 1

## 2020-02-20 NOTE — Telephone Encounter (Signed)
Received notification from [facility] regarding a prior authorization for [facility] 125mg /mL syringe. Authorization has been APPROVED from 02/16/20 to 03/02/21.   Phone # [phone]  [phone], PharmD, MPH Clinical Pharmacist (Rheumatology and Pulmonology)

## 2020-02-20 NOTE — Progress Notes (Signed)
Inpatient Rehab Admissions Coordinator:  Saw pt at bedside. Husband, [name] also present. Informed them that insurance authorization for potential CIR admission has begun.  Will continue to follow.   Wolfgang Phoenix, MS, CCC-SLP Admissions Coordinator [phone]

## 2020-02-20 NOTE — Progress Notes (Addendum)
Occupational Therapy Treatment Patient Details Name: Michele Bryan MRN: [id number] DOB: May 12, 1940 Today's Date: 02/20/2020    History of present illness 79 y.o. female presenting with unstable C1-2 fx. with deformity s/p revision and C1-C5 posterior instrumented fusion. PMHx significant for fx. s/p C1-2 fusion, HLD, HTN, MS, peripheral neuropathy and RA. Patient s/p revision of occipitocervical fusion, extension of fusion from occiput to T2, application of halo frame and vest on 12/16.   OT comments  Pt progressing towards goals with improving cognition during session today. Pt still benefits from cues for precautions and problem solving, but able to demo ability to brush teeth standing at sink with Min A. Pt overall Min A x 2 for short mobility with handheld assist in room with noted posterior bias and constant hands on assist needed to maximize safety. Pt overall Mod A for toileting task, able to demo hygiene seated on BSC. Son present and supportive throughout session. Continue to recommend CIR for intensive, comprehensive therapies.   Follow Up Recommendations  CIR;Supervision/Assistance - 24 hour    Equipment Recommendations  None recommended by OT (has all needed DME)    Recommendations for Other Services Rehab consult    Precautions / Restrictions Precautions Precautions: Cervical;Fall Precaution Booklet Issued: Yes (comment) Required Braces or Orthoses: Other Brace Cervical Brace: Other (comment) Other Brace: Halo Restrictions Weight Bearing Restrictions: No       Mobility Bed Mobility Overal bed mobility: Needs Assistance Bed Mobility: Supine to Sit;Sit to Supine     Supine to sit: Min assist;HOB elevated Sit to supine: Min assist   General bed mobility comments: Min A to sit EOB with assistance to scoot hips forward EOB. Min A for safe negotiation of B LE back into bed with cues for keeping spine straight  Transfers Overall transfer level: Needs  assistance Equipment used: 2 person hand held assist Transfers: Sit to/from [employer] Sit to Stand: Min assist Stand pivot transfers: Min assist;+2 safety/equipment       General transfer comment: Min A for safety in sit to stands from [facility] as L LE extends out with posterior bias during transitional movement. Cues for hand placement and safe sequencing    Balance Overall balance assessment: Needs assistance Sitting-balance support: Single extremity supported;Feet supported Sitting balance-Leahy Scale: Poor Sitting balance - Comments: difficulty gaining balance, needs UE support to prevent LOB   Standing balance support: Single extremity supported;During functional activity;Bilateral upper extremity supported Standing balance-Leahy Scale: Poor Standing balance comment: Reliant on UE support                           ADL either performed or assessed with clinical judgement   ADL Overall ADL's : Needs assistance/impaired     Grooming: Minimal assistance;Standing;Oral care Grooming Details (indicate cue type and reason): Min A for brushing teeth standing at sink to maintain balance due to posterior bias, cues for cervical precautions and use of rinse cup. Increasing assist for balance when reaching to sink                 Toilet Transfer: Minimal assistance;+2 for safety/equipment;Stand-pivot;BSC Toilet Transfer Details (indicate cue type and reason): Min A x 2 with handheld assist Toileting- Clothing Manipulation and Hygiene: Moderate assistance;Sitting/lateral lean Toileting - Clothing Manipulation Details (indicate cue type and reason): Able to demo anterior peri care after urination. Will need increased assist for clothing mgmt     Functional mobility during ADLs: Minimal assistance;+2 for  physical assistance;+2 for safety/equipment General ADL Comments: Posterior bias during standing tasks and poor balance increasing fall risk during  ADLs/mobility     Vision   Vision Assessment?: No apparent visual deficits   Perception     Praxis      Cognition Arousal/Alertness: Awake/alert Behavior During Therapy: WFL for tasks assessed/performed Overall Cognitive Status: Impaired/Different from baseline Area of Impairment: Memory;Following commands;Safety/judgement;Problem solving                     Memory: Decreased short-term memory;Decreased recall of precautions Following Commands: Follows one step commands with increased time Safety/Judgement: Decreased awareness of safety;Decreased awareness of deficits   Problem Solving: Slow processing;Difficulty sequencing;Requires verbal cues;Requires tactile cues General Comments: A&OX4 today. Difficulty recalling precautions, increased time and cues for problem solving        Exercises     Shoulder Instructions       General Comments No dressing on cervical incision, redness/bruising around incision site. Son present and supportive during session    Pertinent Vitals/ Pain       Pain Assessment: Faces Faces Pain Scale: Hurts little more Pain Location: L shoulder and cervical neck Pain Descriptors / Indicators: Grimacing;Operative site guarding;Sore Pain Intervention(s): Monitored during session;Premedicated before session;Repositioned  Home Living                                          Prior Functioning/Environment              Frequency  Min 2X/week        Progress Toward Goals  OT Goals(current goals can now be found in the care plan section)  Progress towards OT goals: Progressing toward goals  Acute Rehab OT Goals Patient Stated Goal: to go home OT Goal Formulation: With patient/family Time For Goal Achievement: 02/28/20 Potential to Achieve Goals: Good ADL Goals Pt Will Perform Grooming: with set-up;with supervision;sitting;standing Pt Will Perform Upper Body Bathing: with min assist;sitting Pt Will Perform  Lower Body Bathing: with mod assist;sitting/lateral leans;with adaptive equipment Pt Will Perform Upper Body Dressing: with min assist Pt Will Perform Lower Body Dressing: with max assist;with adaptive equipment Pt Will Transfer to Toilet: with min guard assist;bedside commode;ambulating;grab bars Pt Will Perform Toileting - Clothing Manipulation and hygiene: with mod assist;sit to/from stand  Plan Discharge plan remains appropriate    Co-evaluation    PT/OT/SLP Co-Evaluation/Treatment: Yes Reason for Co-Treatment: Complexity of the patient's impairments (multi-system involvement);For patient/therapist safety;To address functional/ADL transfers   OT goals addressed during session: ADL's and self-care      AM-PAC OT "6 Clicks" Daily Activity     Outcome Measure   Help from another person eating meals?: A Little Help from another person taking care of personal grooming?: A Little Help from another person toileting, which includes using toliet, bedpan, or urinal?: A Lot Help from another person bathing (including washing, rinsing, drying)?: A Lot Help from another person to put on and taking off regular upper body clothing?: A Lot Help from another person to put on and taking off regular lower body clothing?: Total 6 Click Score: 13    End of Session Equipment Utilized During Treatment: Gait belt;Other (comment) (Halo)  OT Visit Diagnosis: Unsteadiness on feet (R26.81);Muscle weakness (generalized) (M62.81);Pain Pain - Right/Left: Left Pain - part of body: Shoulder   Activity Tolerance Patient tolerated treatment well   Patient Left in  bed;with call bell/phone within reach;with bed alarm set;with family/visitor present   Nurse Communication          Time: [phone] OT Time Calculation (min): 32 min  Charges: OT General Charges $OT Visit: 1 Visit OT Treatments $Self Care/Home Management : 8-22 mins  Lorre Munroe, OTR/L   Lorre Munroe 02/20/2020, 3:06 PM

## 2020-02-20 NOTE — Plan of Care (Signed)
  Problem: Safety: Goal: Ability to remain free from injury will improve Outcome: Progressing   Problem: Education: Goal: Knowledge of General Education information will improve Description: Including pain rating scale, medication(s)/side effects and non-pharmacologic comfort measures Outcome: Progressing   Problem: Health Behavior/Discharge Planning: Goal: Ability to manage health-related needs will improve Outcome: Progressing   Problem: Clinical Measurements: Goal: Ability to maintain clinical measurements within normal limits will improve Outcome: Progressing Goal: Will remain free from infection Outcome: Progressing Goal: Diagnostic test results will improve Outcome: Progressing Goal: Respiratory complications will improve Outcome: Progressing Goal: Cardiovascular complication will be avoided Outcome: Progressing

## 2020-02-20 NOTE — Progress Notes (Signed)
Physical Therapy Treatment Patient Details Name: Michele Bryan MRN: [id number] DOB: 04-19-1940 Today's Date: 02/20/2020    History of Present Illness 79 y.o. female presenting with unstable C1-2 fx. with deformity s/p revision and C1-C5 posterior instrumented fusion. PMHx significant for fx. s/p C1-2 fusion, HLD, HTN, MS, peripheral neuropathy and RA. Patient s/p revision of occipitocervical fusion, extension of fusion from occiput to T2, application of halo frame and vest on 12/16.   PT Comments     Patient progressing towards goals with improving cognition during session today. Patientt still benefits from cues for precautions and problem solving. Patient requires minA+2 for bed mobility and transfers. Patient overall Min A x 2 for short mobility with HHAx2 in room with noted posterior bias and constant hands on assist needed to maximize safety. Son present and supportive throughout session. Continue to recommend comprehensive inpatient rehab (CIR) for post-acute therapy needs.       Follow Up Recommendations  CIR;Supervision/Assistance - 24 hour     Equipment Recommendations  None recommended by PT    Recommendations for Other Services       Precautions / Restrictions Precautions Precautions: Cervical;Fall Precaution Booklet Issued: Yes (comment) Required Braces or Orthoses: Other Brace Cervical Brace: Other (comment) Other Brace: Halo Restrictions Weight Bearing Restrictions: No    Mobility  Bed Mobility Overal bed mobility: Needs Assistance Bed Mobility: Supine to Sit;Sit to Supine     Supine to sit: Min assist;HOB elevated Sit to supine: Min assist   General bed mobility comments: Min A to sit EOB with assistance to scoot hips forward EOB. Min A for safe negotiation of B LE back into bed with cues for keeping spine straight  Transfers Overall transfer level: Needs assistance Equipment used: 2 person hand held assist Transfers: Sit to/from [employer] Sit to Stand: Min assist Stand pivot transfers: Min assist;+2 safety/equipment       General transfer comment: Min A for safety in sit to stands from [facility] as L LE extends out with posterior bias during transitional movement. Cues for hand placement and safe sequencing  Ambulation/Gait Ambulation/Gait assistance: Min assist;+2 safety/equipment Gait Distance (Feet): 10 Feet Assistive device: 2 person hand held assist Gait Pattern/deviations: Step-to pattern;Decreased stride length;Wide base of support Gait velocity: Decreased   General Gait Details: Patient with hesitant gait pattern due overthinking, instructed patient to not overthink and just walk normally, intermittent follow through. Patient required step by step cues for approaching bed to sit, cues for hand placement and sequencing   Stairs             Wheelchair Mobility    Modified Rankin (Stroke Patients Only)       Balance Overall balance assessment: Needs assistance Sitting-balance support: Single extremity supported;Feet supported Sitting balance-Leahy Scale: Poor Sitting balance - Comments: difficulty gaining balance, needs UE support to prevent LOB   Standing balance support: Single extremity supported;During functional activity;Bilateral upper extremity supported Standing balance-Leahy Scale: Poor Standing balance comment: Reliant on UE support                            Cognition Arousal/Alertness: Awake/alert Behavior During Therapy: WFL for tasks assessed/performed Overall Cognitive Status: Impaired/Different from baseline Area of Impairment: Memory;Following commands;Safety/judgement;Problem solving                     Memory: Decreased short-term memory;Decreased recall of precautions Following Commands: Follows one step commands with increased time  Safety/Judgement: Decreased awareness of safety;Decreased awareness of deficits   Problem Solving: Slow  processing;Difficulty sequencing;Requires verbal cues;Requires tactile cues General Comments: A&OX4 today. Difficulty recalling precautions, increased time and cues for problem solving      Exercises      General Comments General comments (skin integrity, edema, etc.): No dressing on cervical incision, redness/bruising around incision site. Son present and supportive during session      Pertinent Vitals/Pain Pain Assessment: Faces Faces Pain Scale: Hurts little more Pain Location: L shoulder and cervical neck Pain Descriptors / Indicators: Grimacing;Operative site guarding;Sore Pain Intervention(s): Monitored during session;Premedicated before session;Repositioned    Home Living                      Prior Function            PT Goals (current goals can now be found in the care plan section) Acute Rehab PT Goals Patient Stated Goal: to go home PT Goal Formulation: With patient/family Time For Goal Achievement: 02/21/20 Potential to Achieve Goals: Good Progress towards PT goals: Progressing toward goals    Frequency    Min 5X/week      PT Plan Current plan remains appropriate    Co-evaluation PT/OT/SLP Co-Evaluation/Treatment: Yes Reason for Co-Treatment: Complexity of the patient's impairments (multi-system involvement);For patient/therapist safety;To address functional/ADL transfers PT goals addressed during session: Mobility/safety with mobility;Balance OT goals addressed during session: ADL's and self-care      AM-PAC PT "6 Clicks" Mobility   Outcome Measure  Help needed turning from your back to your side while in a flat bed without using bedrails?: A Little Help needed moving from lying on your back to sitting on the side of a flat bed without using bedrails?: A Little Help needed moving to and from a bed to a chair (including a wheelchair)?: A Little Help needed standing up from a chair using your arms (e.g., wheelchair or bedside chair)?: A  Little Help needed to walk in hospital room?: A Little Help needed climbing 3-5 steps with a railing? : A Lot 6 Click Score: 17    End of Session Equipment Utilized During Treatment: Gait belt Activity Tolerance: Patient tolerated treatment well Patient left: in bed;with call bell/phone within reach;with bed alarm set;with family/visitor present Nurse Communication: Mobility status PT Visit Diagnosis: Unsteadiness on feet (R26.81);Pain;Other symptoms and signs involving the nervous system (R29.898) Pain - part of body:  (neck, shoulders (L worse than R))     Time: 2633-3545 PT Time Calculation (min) (ACUTE ONLY): 34 min  Charges:  $Therapeutic Activity: 8-22 mins                     Gregor Hams, PT, DPT Acute Rehabilitation Services Pager [phone] Office [phone]    Zannie Kehr Allred 02/20/2020, 3:20 PM

## 2020-02-20 NOTE — Progress Notes (Addendum)
Neurosurgery Service Progress Note  Subjective: No acute events overnight, doing well today  Objective: Vitals:   02/19/20 1548 02/19/20 2021 02/20/20 0358 02/20/20 0850  BP: 127/74 (!) 117/92 (!) 131/95 132/80  Pulse: 92 91 98 91  Resp: 18 16 15 16   Temp: 98.6 F (37 C) 98.5 F (36.9 C) 98.8 F (37.1 C) 98 F (36.7 C)  TempSrc: Oral Oral Oral Oral  SpO2: 99% 100% 97% 99%  Weight:      Height:        Physical Exam: Good alignment, in halo vest Strength 5/5x4, SILTx4 Incision c/d/i  Assessment & Plan: 79 y.o. woman s/p revision of construct with O-5 posterior fusion, POD1 XR with likely failure of inferior construct hardware with pullout of lateral mass screws. CT C-spine with hardware pullout, kyphotic deformity, 12/16 revision of hardware with O-T2 PSIF, 12/17 XR C-spine good position, 12/19 halo pins re-torqued  -pt has emergency allen key taped to chest piece, keep on at all times -activity as tolerated in halo -bacitracin bid to pin sites -PT/OT, SQH -will add lidocaine patch and schedule APAP to pain regimen for better control -CIR pending  1/20  02/20/20 10:32 AM  Addendum: Appreciate Dr. 02/22/20 recs, will send vit D level and have pharmacy start the process to see if we can transition the fosamax to forteo

## 2020-02-20 NOTE — Care Management Important Message (Signed)
Important Message  Patient Details  Name: Michele Bryan MRN: [id number] Date of Birth: [birth year]   Medicare Important Message Given:  Yes     [name] 02/20/2020, 2:28 PM

## 2020-02-20 NOTE — Telephone Encounter (Signed)
I spoke with Mr. Parrella today.  We discussed her current situation.  I noted that she is on Fosamax by her PCP for the treatment of osteoporosis.  I believe she would benefit by switching to [facility] as it is an anabolic agent.  I discussed the plan with Mr. Estabrook and he was in agreement.  I have written a note to Dr. Maurice Small and requested to discuss the  use of Forteo with Ms. Stefanick's attending physician while she is in the hospital.  I also advised to check vitamin D level to make sure that it is adequate  Pollyann Savoy, MD

## 2020-02-21 ENCOUNTER — Telehealth: Payer: Self-pay | Admitting: Pharmacist

## 2020-02-21 ENCOUNTER — Inpatient Hospital Stay (HOSPITAL_COMMUNITY): Payer: Medicare PPO

## 2020-02-21 ENCOUNTER — Inpatient Hospital Stay (HOSPITAL_COMMUNITY)
Admission: RE | Admit: 2020-02-21 | Discharge: 2020-03-01 | DRG: 560 | Disposition: A | Discharge status: Home Health | Payer: Medicare PPO | Source: Intra-hospital | Attending: Physical Medicine & Rehabilitation | Admitting: Physical Medicine & Rehabilitation

## 2020-02-21 ENCOUNTER — Other Ambulatory Visit: Payer: Self-pay

## 2020-02-21 ENCOUNTER — Encounter (HOSPITAL_COMMUNITY): Payer: Self-pay | Admitting: Physical Medicine & Rehabilitation

## 2020-02-21 DIAGNOSIS — D638 Anemia in other chronic diseases classified elsewhere: Secondary | ICD-10-CM | POA: Diagnosis not present

## 2020-02-21 DIAGNOSIS — G8918 Other acute postprocedural pain: Secondary | ICD-10-CM

## 2020-02-21 DIAGNOSIS — R339 Retention of urine, unspecified: Secondary | ICD-10-CM | POA: Diagnosis present

## 2020-02-21 DIAGNOSIS — Z4789 Encounter for other orthopedic aftercare: Principal | ICD-10-CM

## 2020-02-21 DIAGNOSIS — E785 Hyperlipidemia, unspecified: Secondary | ICD-10-CM | POA: Diagnosis present

## 2020-02-21 DIAGNOSIS — Z8249 Family history of ischemic heart disease and other diseases of the circulatory system: Secondary | ICD-10-CM | POA: Diagnosis not present

## 2020-02-21 DIAGNOSIS — Z7983 Long term (current) use of bisphosphonates: Secondary | ICD-10-CM

## 2020-02-21 DIAGNOSIS — M1990 Unspecified osteoarthritis, unspecified site: Secondary | ICD-10-CM | POA: Diagnosis present

## 2020-02-21 DIAGNOSIS — Z96652 Presence of left artificial knee joint: Secondary | ICD-10-CM | POA: Diagnosis present

## 2020-02-21 DIAGNOSIS — G4700 Insomnia, unspecified: Secondary | ICD-10-CM | POA: Diagnosis present

## 2020-02-21 DIAGNOSIS — G959 Disease of spinal cord, unspecified: Secondary | ICD-10-CM | POA: Diagnosis not present

## 2020-02-21 DIAGNOSIS — D72829 Elevated white blood cell count, unspecified: Secondary | ICD-10-CM | POA: Diagnosis present

## 2020-02-21 DIAGNOSIS — M8589 Other specified disorders of bone density and structure, multiple sites: Secondary | ICD-10-CM

## 2020-02-21 DIAGNOSIS — R1310 Dysphagia, unspecified: Secondary | ICD-10-CM | POA: Diagnosis not present

## 2020-02-21 DIAGNOSIS — G629 Polyneuropathy, unspecified: Secondary | ICD-10-CM | POA: Diagnosis present

## 2020-02-21 DIAGNOSIS — Z96612 Presence of left artificial shoulder joint: Secondary | ICD-10-CM | POA: Diagnosis present

## 2020-02-21 DIAGNOSIS — I10 Essential (primary) hypertension: Secondary | ICD-10-CM

## 2020-02-21 DIAGNOSIS — M810 Age-related osteoporosis without current pathological fracture: Secondary | ICD-10-CM | POA: Diagnosis present

## 2020-02-21 DIAGNOSIS — K5900 Constipation, unspecified: Secondary | ICD-10-CM | POA: Diagnosis present

## 2020-02-21 DIAGNOSIS — Z981 Arthrodesis status: Secondary | ICD-10-CM | POA: Diagnosis not present

## 2020-02-21 DIAGNOSIS — E559 Vitamin D deficiency, unspecified: Secondary | ICD-10-CM | POA: Diagnosis present

## 2020-02-21 DIAGNOSIS — I951 Orthostatic hypotension: Secondary | ICD-10-CM

## 2020-02-21 DIAGNOSIS — M069 Rheumatoid arthritis, unspecified: Secondary | ICD-10-CM | POA: Diagnosis present

## 2020-02-21 DIAGNOSIS — Z885 Allergy status to narcotic agent status: Secondary | ICD-10-CM

## 2020-02-21 DIAGNOSIS — Z79899 Other long term (current) drug therapy: Secondary | ICD-10-CM | POA: Diagnosis not present

## 2020-02-21 DIAGNOSIS — K5903 Drug induced constipation: Secondary | ICD-10-CM | POA: Diagnosis not present

## 2020-02-21 DIAGNOSIS — K219 Gastro-esophageal reflux disease without esophagitis: Secondary | ICD-10-CM | POA: Diagnosis present

## 2020-02-21 DIAGNOSIS — G35 Multiple sclerosis: Secondary | ICD-10-CM

## 2020-02-21 DIAGNOSIS — S129XXS Fracture of neck, unspecified, sequela: Secondary | ICD-10-CM

## 2020-02-21 DIAGNOSIS — S12100D Unspecified displaced fracture of second cervical vertebra, subsequent encounter for fracture with routine healing: Secondary | ICD-10-CM

## 2020-02-21 DIAGNOSIS — M0579 Rheumatoid arthritis with rheumatoid factor of multiple sites without organ or systems involvement: Secondary | ICD-10-CM | POA: Diagnosis not present

## 2020-02-21 MED ORDER — LIDOCAINE 5 % EX PTCH: 1.0000 | MEDICATED_PATCH | CUTANEOUS | Status: DC

## 2020-02-21 MED ORDER — PROCHLORPERAZINE EDISYLATE 10 MG/2ML IJ SOLN: 5.0000 mg | Freq: Four times a day (QID) | INTRAMUSCULAR | Status: DC | PRN

## 2020-02-21 MED ORDER — DIPHENHYDRAMINE HCL 12.5 MG/5ML PO ELIX: 12.5000 mg | ORAL_SOLUTION | Freq: Four times a day (QID) | ORAL | Status: DC | PRN

## 2020-02-21 MED ORDER — PROCHLORPERAZINE MALEATE 5 MG PO TABS
5.0000 mg | ORAL_TABLET | Freq: Four times a day (QID) | ORAL | Status: DC | PRN
  Administered 2020-02-24: 10 mg via ORAL
  Filled 2020-02-21: qty 2
  Filled 2020-02-21: qty 1

## 2020-02-21 MED ORDER — ACETAMINOPHEN 325 MG PO TABS
325.0000 mg | ORAL_TABLET | ORAL | Status: DC | PRN
  Administered 2020-02-22 – 2020-02-29 (×12): 650 mg via ORAL
  Filled 2020-02-21 (×14): qty 2

## 2020-02-21 MED ORDER — PHENOL 1.4 % MT LIQD
1.0000 | OROMUCOSAL | Status: DC | PRN
  Filled 2020-02-21: qty 177

## 2020-02-21 MED ORDER — OXYCODONE HCL 5 MG PO TABS: 10.0000 mg | ORAL_TABLET | ORAL | Status: DC | PRN

## 2020-02-21 MED ORDER — BISACODYL 10 MG RE SUPP
10.0000 mg | Freq: Every day | RECTAL | Status: DC | PRN
  Administered 2020-02-24: 10 mg via RECTAL
  Filled 2020-02-21: qty 1

## 2020-02-21 MED ORDER — TRAZODONE HCL 50 MG PO TABS
25.0000 mg | ORAL_TABLET | Freq: Every evening | ORAL | Status: DC | PRN
  Administered 2020-02-22 – 2020-02-29 (×7): 50 mg via ORAL
  Filled 2020-02-21 (×7): qty 1

## 2020-02-21 MED ORDER — VITAMIN D 25 MCG (1000 UNIT) PO TABS
5000.0000 [IU] | ORAL_TABLET | Freq: Every day | ORAL | Status: DC
  Administered 2020-02-22 – 2020-03-01 (×9): 5000 [IU] via ORAL
  Filled 2020-02-21 (×9): qty 5

## 2020-02-21 MED ORDER — LIDOCAINE HCL URETHRAL/MUCOSAL 2 % EX GEL: CUTANEOUS | Status: DC | PRN

## 2020-02-21 MED ORDER — MENTHOL 3 MG MT LOZG
1.0000 | LOZENGE | OROMUCOSAL | Status: DC | PRN
  Administered 2020-02-22: 3 mg via ORAL
  Filled 2020-02-21: qty 9

## 2020-02-21 MED ORDER — PROCHLORPERAZINE 25 MG RE SUPP
12.5000 mg | Freq: Four times a day (QID) | RECTAL | Status: DC | PRN
Start: 2020-02-21 — End: 2020-03-01

## 2020-02-21 MED ORDER — HEPARIN SODIUM (PORCINE) 5000 UNIT/ML IJ SOLN
5000.0000 [IU] | Freq: Three times a day (TID) | INTRAMUSCULAR | Status: DC
  Administered 2020-02-21 – 2020-03-01 (×26): 5000 [IU] via SUBCUTANEOUS
  Filled 2020-02-21 (×26): qty 1

## 2020-02-21 MED ORDER — OXYCODONE HCL 5 MG PO TABS
10.0000 mg | ORAL_TABLET | ORAL | Status: DC | PRN
  Administered 2020-02-21 – 2020-02-22 (×4): 15 mg via ORAL
  Administered 2020-02-23: 10 mg via ORAL
  Administered 2020-02-23 – 2020-02-24 (×3): 15 mg via ORAL
  Administered 2020-02-24: 10 mg via ORAL
  Administered 2020-02-24: 15 mg via ORAL
  Administered 2020-02-26 – 2020-02-28 (×2): 10 mg via ORAL
  Administered 2020-02-28: 15 mg via ORAL
  Filled 2020-02-21: qty 3
  Filled 2020-02-21: qty 2
  Filled 2020-02-21 (×3): qty 3
  Filled 2020-02-21: qty 2
  Filled 2020-02-21 (×4): qty 3
  Filled 2020-02-21 (×2): qty 2
  Filled 2020-02-21: qty 3

## 2020-02-21 MED ORDER — LOSARTAN POTASSIUM 50 MG PO TABS
50.0000 mg | ORAL_TABLET | Freq: Two times a day (BID) | ORAL | Status: DC
  Administered 2020-02-21 – 2020-02-28 (×14): 50 mg via ORAL
  Filled 2020-02-21 (×14): qty 1

## 2020-02-21 MED ORDER — POLYETHYLENE GLYCOL 3350 17 G PO PACK
17.0000 g | PACK | Freq: Every day | ORAL | Status: DC
  Administered 2020-02-21 – 2020-03-01 (×9): 17 g via ORAL
  Filled 2020-02-21 (×10): qty 1

## 2020-02-21 MED ORDER — CYCLOBENZAPRINE HCL 10 MG PO TABS
10.0000 mg | ORAL_TABLET | Freq: Three times a day (TID) | ORAL | Status: DC | PRN
  Administered 2020-02-22 – 2020-02-29 (×5): 10 mg via ORAL
  Filled 2020-02-21 (×5): qty 1

## 2020-02-21 MED ORDER — FLEET ENEMA 7-19 GM/118ML RE ENEM: 1.0000 | ENEMA | Freq: Once | RECTAL | Status: DC | PRN

## 2020-02-21 MED ORDER — POLYETHYLENE GLYCOL 3350 17 G PO PACK
17.0000 g | PACK | Freq: Every day | ORAL | Status: DC | PRN
  Administered 2020-02-29: 17 g via ORAL

## 2020-02-21 MED ORDER — BACITRACIN ZINC 500 UNIT/GM EX OINT
TOPICAL_OINTMENT | Freq: Two times a day (BID) | CUTANEOUS | Status: DC
  Administered 2020-02-23: 1 via TOPICAL
  Filled 2020-02-21 (×3): qty 28.35

## 2020-02-21 MED ORDER — GABAPENTIN 100 MG PO CAPS
100.0000 mg | ORAL_CAPSULE | Freq: Three times a day (TID) | ORAL | Status: DC
  Administered 2020-02-21 – 2020-03-01 (×26): 100 mg via ORAL
  Filled 2020-02-21 (×27): qty 1

## 2020-02-21 MED ORDER — GUAIFENESIN-DM 100-10 MG/5ML PO SYRP: 5.0000 mL | ORAL_SOLUTION | Freq: Four times a day (QID) | ORAL | Status: DC | PRN

## 2020-02-21 MED ORDER — ALUM & MAG HYDROXIDE-SIMETH 200-200-20 MG/5ML PO SUSP: 30.0000 mL | ORAL | Status: DC | PRN

## 2020-02-21 MED ORDER — ADULT MULTIVITAMIN W/MINERALS CH
1.0000 | ORAL_TABLET | ORAL | Status: DC
  Administered 2020-02-22 – 2020-02-29 (×4): 1 via ORAL
  Filled 2020-02-21 (×4): qty 1

## 2020-02-21 MED ORDER — AMLODIPINE BESYLATE 5 MG PO TABS
5.0000 mg | ORAL_TABLET | Freq: Every day | ORAL | Status: DC
  Administered 2020-02-22 – 2020-03-01 (×9): 5 mg via ORAL
  Filled 2020-02-21 (×10): qty 1

## 2020-02-21 MED ORDER — LIDOCAINE 5 % EX PTCH
1.0000 | MEDICATED_PATCH | CUTANEOUS | Status: DC
  Administered 2020-02-22 – 2020-02-27 (×5): 1 via TRANSDERMAL
  Filled 2020-02-21 (×9): qty 1

## 2020-02-21 MED ORDER — TRAMADOL HCL 50 MG PO TABS
50.0000 mg | ORAL_TABLET | Freq: Four times a day (QID) | ORAL | Status: DC | PRN
  Administered 2020-02-22 – 2020-02-29 (×6): 50 mg via ORAL
  Filled 2020-02-21 (×7): qty 1

## 2020-02-21 MED ORDER — LIDOCAINE 5 % EX PTCH: 1.0000 | MEDICATED_PATCH | CUTANEOUS | 0 refills | Status: DC

## 2020-02-21 NOTE — Progress Notes (Signed)
Courtney Heys, MD  Physician  Physical Medicine and Rehabilitation  PMR Pre-admission     Signed  Date of Service:  02/19/2020  2:50 PM      Related encounter: Admission (Discharged) from 02/13/2020 in [place] all [x]Manual[x]Template[]Copied  Added by: [x]Vista Sawatzky, Vertis Kelch, RN[x]Graves Leodis Binet, CCC-SLP[x]Lovorn, Jinny Blossom, MD   []Hover for details  PMR Admission Coordinator Pre-Admission Assessment   Patient: Michele Bryan is an 79 y.o., female MRN: [id number] DOB: [birth year] Height: 5' 3" (160 cm) Weight: 48.3 kg   Insurance Information HMO:     PPO: yes     PCP:      IPA:      80/20:      OTHER:  PRIMARY: [employer]      Policy#: [id number]      Subscriber: patient CM Name: Lattie Haw    Phone#: [phone], ext. [phone].  Approval begins 02/21/20  Weekly updates of clinicals requested every 7 days to [place] ext [phone].     Fax#: [phone] Pre-Cert#: [id number]      Employer:  Benefits:  Phone #: n/a- online at [email or web address]     Name:  Eff. Date: 03/04/19-03/02/20     Deduct: does not have for in-network providers      Out of Pocket Max: $3,300 [phone] met)      Life Max: NA CIR: $125/day co-pay for days 1-10      SNF: $0 co-pay days 1-20; $50 co-pay days 21-100 Outpatient: $20/visit co-pay pending service     Co-Pay:  Home Health: 100% coverage, 0% co-insurance; excludes personal in-home care      Co-Pay:  DME: 80% coverage     Co-Pay: 20% Providers: in-network  SECONDARY:       Policy#:      Phone#:    Development worker, community:       Phone#:    The Engineer, petroleum" for patients in Inpatient Rehabilitation Facilities with attached "Privacy Act [place] Records" was provided and verbally reviewed with: Patient   Emergency Contact Information         Contact Information     Name Relation Home Work [address]    Jiles Harold     [phone]     [place], [place] [phone]   [phone]         Current Medical History  Patient Admitting Diagnosis: closed cervical spine fx; s/p revision of occipitocervical fusion, extension of fusion from occiput to T2   History of Present Illness: 79 year old female with a history of fracture s/p C1-2 posterior instrumented fusion. Presented on 12/13 with recurrence of her chin on chest deformity with worsening pain and new dysphagia. CT confirmed fracture of the bone around her C2 screws. She presented for revision of her hardware and extension of her instrumented fusion. Patient also with history of arthritis, GERD, HTN, multiple sclerosis, peripheral neuropathy and vitamin D deficiency.    Underwent occiput to C 5 posterior instrumented fusion, revision of prior C 1-2 fusion on 02/13/2020.Postoperatively CT confirmed failure of inferior construct hardware will pullout of lateral mass screws, kyphotic deformity. On 12/16 underwent revision of occipitocervical fusion, extension of fusion form occiput to T2, attempted open reduction of C 1-2 fracture, use of intraop CT with navigation, application of halo frame and vest.  postoperatively delirium resolved. Halo pins re-torqued on 12/17 by Dr. Zada Finders. Heparin  SQ for DVT prophylaxis, bacitracin to pin sites with [facility] emergency key taped to chest at all times. Postop pain management with med changes as needed. Plans to transition the fosamax to forteo per Dr. Bo Merino recommendation.   Patient's medical record from [facility] has been reviewed by the rehabilitation admission coordinator and physician.   Past Medical History      Past Medical History:  Diagnosis Date  . Anemia    . Arthritis    . GERD (gastroesophageal reflux disease)    . History of blood transfusion    . Hyperlipidemia    . Hypertension    . MS (multiple sclerosis) ([place])    . Neuromuscular disorder ([place])      multiple scleroosis/peripheral neuropathy  .  PONV (postoperative nausea and vomiting)    . Rheumatoid arthritis ([place])    . Ulcer    . Vitamin D deficiency        Family History   family history includes Heart attack in her father.   Prior Rehab/Hospitalizations Has the patient had prior rehab or hospitalizations prior to admission? Yes   Has the patient had major surgery during 100 days prior to admission? Yes              Current Medications   Current Facility-Administered Medications:  .  0.9 %  sodium chloride infusion (Manually program via Guardrails IV Fluids), , Intravenous, Once, Ostergard, Thomas A, MD .  0.9 %  sodium chloride infusion, 250 mL, Intravenous, Continuous, Ostergard, Thomas A, MD .  acetaminophen (TYLENOL) tablet 650 mg, 650 mg, Oral, Q4H, 650 mg at 02/21/20 0955 **OR** [DISCONTINUED] acetaminophen (TYLENOL) suppository 650 mg, 650 mg, Rectal, Q4H PRN, Judith Part, MD .  amLODipine (NORVASC) tablet 5 mg, 5 mg, Oral, Daily, Ostergard, Joyice Faster, MD, 5 mg at 02/21/20 0955 .  bacitracin ointment, , Topical, BID, Judith Part, MD, Given at 02/21/20 [phone] .  cholecalciferol (VITAMIN D3) tablet 5,000 Units, 5,000 Units, Oral, Daily, Judith Part, MD, 5,000 Units at 02/21/20 0955 .  cyclobenzaprine (FLEXERIL) tablet 10 mg, 10 mg, Oral, TID PRN, Judith Part, MD, 10 mg at 02/21/20 0955 .  docusate sodium (COLACE) capsule 100 mg, 100 mg, Oral, BID, Judith Part, MD, 100 mg at 02/21/20 0955 .  gabapentin (NEURONTIN) capsule 100 mg, 100 mg, Oral, TID, Judith Part, MD, 100 mg at 02/21/20 0955 .  heparin injection 5,000 Units, 5,000 Units, Subcutaneous, Q8H, Bergman, Meghan D, NP, 5,000 Units at 02/21/20 0403 .  HYDROmorphone (DILAUDID) injection 1 mg, 1 mg, Intravenous, Q3H PRN, Judith Part, MD, 1 mg at 02/21/20 0958 .  lactated ringers infusion, , Intravenous, Continuous, Ostergard, Joyice Faster, MD, Last Rate: 10 mL/hr at 02/17/20 0805, Infusion Verify at 02/17/20 0805 .   lidocaine (LIDODERM) 5 % 1 patch, 1 patch, Transdermal, Q24H, Ostergard, Joyice Faster, MD, 1 patch at 02/20/20 1621 .  losartan (COZAAR) tablet 50 mg, 50 mg, Oral, BID, Judith Part, MD, 50 mg at 02/21/20 0955 .  menthol-cetylpyridinium (CEPACOL) lozenge 3 mg, 1 lozenge, Oral, PRN **OR** phenol (CHLORASEPTIC) mouth spray 1 spray, 1 spray, Mouth/Throat, PRN, Ostergard, Joyice Faster, MD .  multivitamin with minerals tablet 1 tablet, 1 tablet, Oral, Once per day on Mon Wed Fri, Ostergard, Thomas A, MD, 1 tablet at 02/20/20 1835 .  ondansetron (ZOFRAN) tablet 4 mg, 4 mg, Oral, Q6H PRN **OR** ondansetron (ZOFRAN) injection 4 mg, 4 mg, Intravenous, Q6H PRN, Ostergard, Joyice Faster,  MD, 4 mg at 02/17/20 0027 .  oxyCODONE (Oxy IR/ROXICODONE) immediate release tablet 10 mg, 10 mg, Oral, Q4H PRN, Judith Part, MD, 10 mg at 02/17/20 0023 .  oxyCODONE (Oxy IR/ROXICODONE) immediate release tablet 15 mg, 15 mg, Oral, Q4H PRN, Judith Part, MD, 15 mg at 02/21/20 0403 .  polyethylene glycol (MIRALAX / GLYCOLAX) packet 17 g, 17 g, Oral, Daily PRN, Judith Part, MD, 17 g at 02/18/20 2121 .  sodium chloride flush (NS) 0.9 % injection 3 mL, 3 mL, Intravenous, Q12H, Ostergard, Joyice Faster, MD, 3 mL at 02/21/20 0953 .  sodium chloride flush (NS) 0.9 % injection 3 mL, 3 mL, Intravenous, PRN, Judith Part, MD .  zolpidem (AMBIEN) tablet 5 mg, 5 mg, Oral, QHS PRN, Judith Part, MD, 5 mg at 02/20/20 2127   Patients Current Diet:     Diet Order                      Diet regular Room service appropriate? Yes; Fluid consistency: Thin  Diet effective now                      Precautions / Restrictions Precautions Precautions: Cervical,Fall Precaution Booklet Issued: Yes (comment) Precaution Comments: Patient able to recall cervical precautions. Cervical Brace: Other (comment) Other Brace: Halo Restrictions Weight Bearing Restrictions: No    Has the patient had 2 or more falls or a fall  with injury in the past year? Yes   Prior Activity Level Household: leaves house 1x/week or less   Prior Functional Level Self Care: Did the patient need help bathing, dressing, using the toilet or eating? Independent   Indoor Mobility: Did the patient need assistance with walking from room to room (with or without device)? Independent   Stairs: Did the patient need assistance with internal or external stairs (with or without device)? Independent   Functional Cognition: Did the patient need help planning regular tasks such as shopping or remembering to take medications? [place] / [place] Devices/Equipment: Cane (specify quad or straight),Walker (specify type) Home Equipment: Cane - single point,Shower seat - built in,Grab bars - tub/shower,Grab bars - toilet,Cane - quad,Hand held shower head,Walker - 2 wheels   Prior Device Use: Indicate devices/aids used by the patient prior to current illness, exacerbation or injury? used cane or rollator occassionally   Current Functional Level Cognition   Overall Cognitive Status: Impaired/Different from baseline Orientation Level: Oriented X4 Following Commands: Follows one step commands with increased time Safety/Judgement: Decreased awareness of safety,Decreased awareness of deficits General Comments: A&OX4 today. Difficulty recalling precautions, increased time and cues for problem solving    Extremity Assessment (includes Sensation/Coordination)   Upper Extremity Assessment: RUE deficits/detail,LUE deficits/detail RUE Deficits / Details: AROM WFL. Did not exceed 90 degrees shoulder flexion/abduction to maintain cervical precautions. RUE Sensation: WNL RUE Coordination: WNL LUE Deficits / Details: AROM greatly limited 2/2 RA. LUE: Unable to fully assess due to pain LUE Sensation: WNL LUE Coordination: decreased fine motor  Lower Extremity Assessment: Defer to PT evaluation     ADLs    Overall ADL's : Needs assistance/impaired Grooming: Minimal assistance,Standing,Oral care Grooming Details (indicate cue type and reason): Min A for brushing teeth standing at sink to maintain balance due to posterior bias, cues for cervical precautions and use of rinse cup. Increasing assist for balance when reaching to sink Upper Body Bathing: Maximal assistance Lower Body Bathing: Maximal  assistance Upper Body Dressing : Total assistance Upper Body Dressing Details (indicate cue type and reason): Min guard for sitting balance. Lower Body Dressing: Total assistance Lower Body Dressing Details (indicate cue type and reason): Min A for standing balance. Cues for upright posture. Toilet Transfer: Minimal assistance,+2 for safety/equipment,Stand-pivot,BSC Toilet Transfer Details (indicate cue type and reason): Min A x 2 with handheld assist Toileting- Clothing Manipulation and Hygiene: Moderate assistance,Sitting/lateral lean Toileting - Clothing Manipulation Details (indicate cue type and reason): Able to demo anterior peri care after urination. Will need increased assist for clothing mgmt Functional mobility during ADLs: Minimal assistance,+2 for physical assistance,+2 for safety/equipment General ADL Comments: Posterior bias during standing tasks and poor balance increasing fall risk during ADLs/mobility     Mobility   Overal bed mobility: Needs Assistance Bed Mobility: Supine to Sit,Sit to Supine Rolling: Supervision Sidelying to sit: Min guard Supine to sit: Min assist,HOB elevated Sit to supine: Min assist General bed mobility comments: Min A to sit EOB with assistance to scoot hips forward EOB. Min A for safe negotiation of B LE back into bed with cues for keeping spine straight     Transfers   Overall transfer level: Needs assistance Equipment used: 2 person hand held assist Transfers: Sit to/from [employer] Sit to Stand: Min assist Stand pivot transfers: Min  assist,+2 safety/equipment General transfer comment: Min A for safety in sit to stands from [facility] as L LE extends out with posterior bias during transitional movement. Cues for hand placement and safe sequencing     Ambulation / Gait / Stairs / Wheelchair Mobility   Ambulation/Gait Ambulation/Gait assistance: Min assist,+2 safety/equipment Gait Distance (Feet): 10 Feet Assistive device: 2 person hand held assist Gait Pattern/deviations: Step-to pattern,Decreased stride length,Wide base of support General Gait Details: Patient with hesitant gait pattern due overthinking, instructed patient to not overthink and just walk normally, intermittent follow through. Patient required step by step cues for approaching bed to sit, cues for hand placement and sequencing Gait velocity: Decreased Gait velocity interpretation: <1.31 ft/sec, indicative of household ambulator     Posture / Balance Dynamic Sitting Balance Sitting balance - Comments: difficulty gaining balance, needs UE support to prevent LOB Balance Overall balance assessment: Needs assistance Sitting-balance support: Single extremity supported,Feet supported Sitting balance-Leahy Scale: Poor Sitting balance - Comments: difficulty gaining balance, needs UE support to prevent LOB Postural control: Right lateral lean Standing balance support: Single extremity supported,During functional activity,Bilateral upper extremity supported Standing balance-Leahy Scale: Poor Standing balance comment: Reliant on UE support     Special needs/care consideration  Skin surgical incision: neck; cervical, Halo frame and vest and Designated visitor Nikyla Navedo, husband Emergency allen key for halo taped to chest of halo at all times    Previous Home Environment  Living Arrangements: Spouse/significant other  Lives With: Spouse Available Help at Discharge: Family,Available 24 hours/day Type of Home: House Home Layout: Two level,Able to live on main level  with bedroom/bathroom Home Access: Stairs to enter Entrance Stairs-Rails: Right Entrance Stairs-Number of Steps: 1 Bathroom Shower/Tub: Multimedia programmer: Handicapped height Bathroom Accessibility: Yes How Accessible: Accessible via walker,Accessible via wheelchair Home Care Services: No   Discharge Living Setting Plans for Discharge Living Setting: Patient's home Type of Home at Discharge: House Discharge Home Layout: Two level,Able to live on main level with bedroom/bathroom Discharge Home Access: Stairs to enter Entrance Stairs-Number of Steps: 1 Discharge Bathroom Shower/Tub: Walk-in shower Discharge Bathroom Toilet: Handicapped height Discharge Bathroom Accessibility: Yes How Accessible:  Accessible via walker,Accessible via wheelchair Does the patient have any problems obtaining your medications?: No   Social/Family/Support Systems Patient Roles: Spouse Anticipated Caregiver: Morgen Linebaugh, husband Anticipated Caregiver's Contact Information: [phone] Caregiver Availability: 24/7 Is Caregiver In Agreement with Plan?: Yes Does Caregiver/Family have Issues with Lodging/Transportation while Pt is in Rehab?: No   Goals Patient/Family Goal for Rehab: Mod I-Supervision: PT/OT Expected length of stay: 14-16 days Pt/Family Agrees to Admission and willing to participate: Yes Program Orientation Provided & Reviewed with Pt/Caregiver Including Roles  & Responsibilities: Yes   Decrease burden of Care through IP rehab admission: NA   Possible need for SNF placement upon discharge: NA   Patient Condition: I have reviewed medical records from [facility] , spoken with  patient and family member, daughter in Sports coach. I met with patient at the bedside for inpatient rehabilitation assessment.  Patient will benefit from ongoing PT and OT, can actively participate in 3 hours of therapy a day 5 days of the week, and can make measurable gains during the admission.  Patient  will also benefit from the coordinated team approach during an Inpatient Acute Rehabilitation admission.  The patient will receive intensive therapy as well as Rehabilitation physician, nursing, social worker, and care management interventions.  Due to bladder management, bowel management, safety, skin/wound care, disease management, medication administration, pain management and patient education the patient requires 24 hour a day rehabilitation nursing.  The patient is currently min assist overall with mobility and basic ADLs.  Discharge setting and therapy post discharge at home with home health is anticipated.  Patient has agreed to participate in the Acute Inpatient Rehabilitation Program and will admit today.   Preadmission Screen Completed By:  Cleatrice Burke, 02/21/2020 11:01 AM ______________________________________________________________________   Discussed status with Dr. Dagoberto Ligas on 02/21/2020 at 1105 and received approval for admission today.   Admission Coordinator:  Cleatrice Burke, RN, time  [id number] Date  02/21/2020    Assessment/Plan: Diagnosis: 1. Does the need for close, 24 hr/day Medical supervision in concert with the patient's rehab needs make it unreasonable for this patient to be served in a less intensive setting? Yes 2. Co-Morbidities requiring supervision/potential complications: unstable M4/1 fx, HALO and vest; HTN, MS, RA, peripehral neuropathy, needs forteo 3. Due to bladder management, bowel management, safety, skin/wound care, disease management, medication administration, pain management and patient education, does the patient require 24 hr/day rehab nursing? Yes 4. Does the patient require coordinated care of a physician, rehab nurse, PT, OT, and SLP to address physical and functional deficits in the context of the above medical diagnosis(es)? Yes Addressing deficits in the following areas: balance, endurance, locomotion, strength, transferring, bathing,  dressing, feeding, grooming and toileting 5. Can the patient actively participate in an intensive therapy program of at least 3 hrs of therapy 5 days a week? Yes 6. The potential for patient to make measurable gains while on inpatient rehab is good 7. Anticipated functional outcomes upon discharge from inpatient rehab: modified independent and supervision PT, modified independent and supervision OT, n/a SLP 8. Estimated rehab length of stay to reach the above functional goals is: 14-16 days 9. Anticipated discharge destination: Home 10. Overall Rehab/Functional Prognosis: good     MD Signature:            Revision History  Note Details  Jan Fireman, MD File Time 02/21/2020 11:24 AM  Author Type Physician Status Signed  Last Editor Courtney Heys, MD Service Physical Medicine and [place] # [id number] Admit Date 02/21/2020

## 2020-02-21 NOTE — Progress Notes (Signed)
Patient arrived on unit, oriented to unit. Reviewed medications, therapy schedule, rehab routine and plan of care. States an understanding of information reviewed. No complications noted at this time. Patient reports no pain and is AX4 Michele Bryan  

## 2020-02-21 NOTE — Progress Notes (Signed)
Inpatient Rehabilitation  Patient information reviewed and entered into eRehab system by Precious Segall M. Jean Skow, M.A., CCC/SLP, PPS Coordinator.  Information including medical coding, functional ability and quality indicators will be reviewed and updated through discharge.    

## 2020-02-21 NOTE — Progress Notes (Signed)
Inpatient Rehabilitation Medication Review by a Pharmacist  A complete drug regimen review was completed for this patient to identify any potential clinically significant medication issues.  Clinically significant medication issues were identified:  yes   Type of Medication Issue Identified Description of Issue Urgent (address now) Non-Urgent (address on AM team rounds) Plan Plan Accepted by Provider? (Yes / No / Pending AM Rounds)  Drug Interaction(s) (clinically significant)       Duplicate Therapy       Allergy       No Medication Administration End Date       Incorrect Dose       Additional Drug Therapy Needed       Other  Oxycodone 10-15mg  q4hrs as needed for severe pain.  Needs further clarification of when to administer 10mg  vs 15mg , perhaps based on pain score.  Non-urgent Provide clarification on when to use the 10mg  vs 15mg  dose.  Alternatively could change order to Oxycodone 10mg  q4hrs as needed for severe pain. Pending    Name of provider notified for urgent issues identified: n/a  Provider Method of Notification:    For non-urgent medication issues to be resolved on team rounds tomorrow morning a [facility] Secure Chat Handoff was sent to:    Pharmacist comments: Need to clarify Oxycodone >> DONE.  PTA meds not resumed: Omeprazole and Vit E, not felt to be clinically significant.  Time spent performing this drug regimen review (minutes):  20   Oumou Smead, 02/21/2020 4:48 PM

## 2020-02-21 NOTE — Progress Notes (Signed)
Inpatient Rehabilitation Medication Review by a Pharmacist  A complete drug regimen review was completed for this patient to identify any potential clinically significant medication issues.  Clinically significant medication issues were identified:  yes   Type of Medication Issue Identified Description of Issue Urgent (address now) Non-Urgent (address on AM team rounds) Plan Plan Accepted by Provider? (Yes / No / Pending AM Rounds)  Drug Interaction(s) (clinically significant)       Duplicate Therapy       Allergy       No Medication Administration End Date       Incorrect Dose       Additional Drug Therapy Needed       Other  Oxycodone 10-15mg  q4hrs as needed for severe pain.  Needs further clarification of when to administer 10mg  vs 15mg , perhaps based on pain score.  Non-urgent Provide clarification on when to use the 10mg  vs 15mg  dose.  Alternatively could change order to Oxycodone 10mg  q4hrs as needed for severe pain. Pending    Name of provider notified for urgent issues identified: n/a  Provider Method of Notification:    For non-urgent medication issues to be resolved on team rounds tomorrow morning a [facility] Secure Chat Handoff was sent to:    Pharmacist comments: Need to clarify Oxycodone.  PTA meds not resumed: Omeprazole and Vit E, not felt to be clinically significant.  Time spent performing this drug regimen review (minutes):  20   Rorie Delmore, 02/21/2020 4:48 PM

## 2020-02-21 NOTE — Discharge Summary (Signed)
Discharge Summary  Date of Admission: 02/13/2020  Date of Discharge: 02/21/20  Attending Physician: Autumn Patty, MD  Hospital Course: Patient was admitted after a revision of her prior posterior cervical construct. She was taken to the OR on 12/13 for extension of her failed C1-2 construct to O-C5. On POD2, she had recurrence of her deformity and xrays showed the lateral mass screws had all pulled out. She was therefore taken back to the OR on 12/16 for revision with IOM, intra-op CT with navigation, and extension of construct from O-T2 with intra-op halo placement. She was recovered in PACU and transferred to 5N. She did well post-op with good alignment and improved function. Her hospital course was uncomplicated and the patient was discharged to CIR on 02/21/20. She will follow up in clinic with me in 2 weeks.  Neurologic exam at discharge:  AOx3, PERRL, EOMI, FS, TM Strength 5/5 x4, SILTx4  Discharge diagnosis: Closed fracture of the cervical spine  Jadene Pierini, MD 02/21/20 1:05 PM

## 2020-02-21 NOTE — Telephone Encounter (Signed)
Submitted a Prior Authorization request to [facility] for FORTEO via Cover My Meds. Will update once we receive a response. Patient has history of cervical fractures (03/2019) and scapular-spinal fracture in 2019.  Fosamax currently managed by PCP, Dr. Hyacinth Meeker, but Sharren Bridge may provide her greater benefit given its anabolic effects.  Cover My Meds Key: [id number]  Chesley Mires, PharmD, MPH Clinical Pharmacist (Rheumatology and Pulmonology)

## 2020-02-21 NOTE — Progress Notes (Signed)
Inpatient Rehabilitation Admissions Coordinator  I have CIR bed and insurance approval to admit patient today. I have notified acute team, TOC and left voicemail for Dr. Zada Finders to obtain approval to d/c to Cir. I met with patient and her daughter in law at bedside and they are in agreement to admit.  Danne Baxter, RN, MSN Rehab Admissions Coordinator [phone] 02/21/2020 10:22 AM

## 2020-02-21 NOTE — Discharge Instructions (Signed)
Discharge Instructions ° °No restriction in activities, slowly increase your activity back to normal.  ° °Your incision is closed with dermabond (purple glue). This will naturally fall off over the next 1-2 weeks.  ° °Okay to shower on the day of discharge. Use regular soap and water and try to be gentle when cleaning your incision.  ° °Follow up with Dr. Areen Trautner in 2 weeks after discharge. If you do not already have a discharge appointment, please call his office at [phone] to schedule a follow up appointment. If you have any concerns or questions, please call the office and let us know. °

## 2020-02-21 NOTE — H&P (Signed)
Physical Medicine and Rehabilitation Admission H&P    CC: Functional deficits due to C1/C2 fracture with failure of hardware X 2 and post op confusion.    HPI: Michele Bryan. Hack is a 79 year old female with history of HTN, MS--no medications/last flare 3 years (Dr. Terrace Arabia), RA, anemia, fall 03/2018 with neck pain and found to have C1/C2 fracture with displacement requiring fusion 12/06/19. She developed recurrent pian with dysphagia and was found to have hardware failure with loosening of C2 screws and chin on chest deformity. She was admitted on  02/13/20 for revision with occiput to C5 posterior fusion by Dr. Maurice Small.  Post op was doing well but on 12/15 develop increase neck pain with worsened posture. CT neck done revealing fracture of C5 facet with concerns for hardware pull out. She was taken back to OR for revision with extension of fusion from occiput to T2 with attempted open reducation of C1/C2 and placed in Halo for support. Post op, she has had issues with hallucinations with headaches and shoulder pain.      She continuesl requiring IV Dilaudid with prn oxycodone. Tylenol added every 4 hours with lidocaine for local measures.  She has had issues with confusion, difficulty problem-solving and requiring cues to maintain precautions.  Therapy has been ongoing and patient continues to have limitations due to pain, weakness with posterior bias when standing or mobile, has cognitive deficits with poor safety awareness and needs increased time to follow simple one-step commands.  CIR was recommended due to functional decline.   Pt reports doesn't eat much at baseline, but eating ~ 50% of meals-  At rest, her pain is 8/10; with movement, pain is 10/10 right now- would like changes to pain regimen. No BM since before admission 7 days ago.  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  Dry mouth and a little confused per pt.    Review of Systems  Constitutional:  Negative for chills.       Appetite has been poor  HENT: Negative for hearing loss and tinnitus.   Eyes: Positive for double vision (better now). Negative for blurred vision.  Respiratory: Negative for cough and shortness of breath.   Cardiovascular: Negative for chest pain and leg swelling.  Gastrointestinal: Positive for constipation.  Musculoskeletal: Positive for myalgias.       Shoulder pain--worse on left,  Left wrist pain due to prior fracture, hip/knee pain under control.   Neurological: Positive for weakness and headaches.  All other systems reviewed and are negative.    Past Medical History:  Diagnosis Date  . Anemia   . Arthritis   . GERD (gastroesophageal reflux disease)   . History of blood transfusion   . Hyperlipidemia   . Hypertension   . MS (multiple sclerosis) (HCC)   . Neuromuscular disorder (HCC)    multiple scleroosis/peripheral neuropathy  . PONV (postoperative nausea and vomiting)   . Rheumatoid arthritis (HCC)   . Ulcer   . Vitamin D deficiency     Past Surgical History:  Procedure Laterality Date  . APPLICATION OF INTRAOPERATIVE CT SCAN N/A 02/16/2020   Procedure: APPLICATION OF INTRAOPERATIVE CT SCAN;  Surgeon: Jadene Pierini, MD;  Location: [facility] OR;  Service: Neurosurgery;  Laterality: N/A;  . AUGMENTATION MAMMAPLASTY    . BACK SURGERY    . breast augumentation    . BREAST SURGERY     biopsy  . COLONOSCOPY    . DILATION AND CURETTAGE  OF UTERUS    . EYE SURGERY     both eys,cataracts  . goiter    . HALO APPLICATION N/A 02/16/2020   Procedure: HALO TRACTION APPLICATION;  Surgeon: Jadene Pierini, MD;  Location: [facility] OR;  Service: Neurosurgery;  Laterality: N/A;  . JOINT REPLACEMENT    . KNEE ARTHROSCOPY Right   . ORIF SHOULDER FRACTURE Left 03/05/2018   Procedure: OPEN REDUCTION INTERNAL FIXATION (ORIF) LEFT SCAPULA;  Surgeon: Beverely Low, MD;  Location: [facility] OR;  Service: Orthopedics;  Laterality: Left;  . POSTERIOR CERVICAL  FUSION/FORAMINOTOMY N/A 12/06/2019   Procedure: Cervical one-two Posterior instrumented fusion;  Surgeon: Jadene Pierini, MD;  Location: [facility] OR;  Service: Neurosurgery;  Laterality: N/A;  . POSTERIOR CERVICAL FUSION/FORAMINOTOMY N/A 02/13/2020   Procedure: Revision of cervical instrumented fusion with Occiput to Cervical Four posterior instrumented fusion;  Surgeon: Jadene Pierini, MD;  Location: [facility] OR;  Service: Neurosurgery;  Laterality: N/A;  posterior  . POSTERIOR CERVICAL FUSION/FORAMINOTOMY N/A 02/16/2020   Procedure: Occiput to Thoracic Two Posterior Cervical Fusion with AIRO and Application of Halo;  Surgeon: Jadene Pierini, MD;  Location: [facility] OR;  Service: Neurosurgery;  Laterality: N/A;  . REVERSE SHOULDER ARTHROPLASTY Left 05/15/2017   Procedure: LEFT REVERSE SHOULDER ARTHROPLASTY;  Surgeon: Beverely Low, MD;  Location: [facility] OR;  Service: Orthopedics;  Laterality: Left;  . REVERSE SHOULDER ARTHROPLASTY Left 10/11/2018   Procedure: left shoulder irrigation and debridement, open poly exchange and removal of painful hardware;  Surgeon: Beverely Low, MD;  Location: [facility] ORS;  Service: Orthopedics;  Laterality: Left;  . THYROID LOBECTOMY    . TOTAL KNEE ARTHROPLASTY Right 06/15/2012   Procedure: RIGHT TOTAL KNEE ARTHROPLASTY;  Surgeon: [name], MD;  Location: [facility] ORS;  Service: Orthopedics;  Laterality: Right;  . WRIST SURGERY Left     Family History  Problem Relation Age of Onset  . Heart attack Father   . Breast cancer Neg Hx     Social History:  Married. 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 Uses a cane in the evenings.  Son and daughter in law have been staying with them and help out with meals/housework.  She  reports that she has never smoked. She has never used smokeless tobacco. She reports that she does not drink alcohol and does not use drugs.   Allergies  Allergen Reactions  . Codeine Nausea And Vomiting  . Demerol [Meperidine] Nausea And  Vomiting    Medications Prior to Admission  Medication Sig Dispense Refill  . Abatacept (ORENCIA CLICKJECT) 125 MG/ML SOAJ Inject 125 mg into the skin once a week. 12 mL 0  . acetaminophen (TYLENOL) 650 MG CR tablet Take 1,300 mg by mouth every 8 (eight) hours as needed for pain.     Marland Kitchen alendronate (FOSAMAX) 70 MG tablet Take 70 mg by mouth every Tuesday. Take with a full glass of water on an empty stomach.    Marland Kitchen amLODipine (NORVASC) 5 MG tablet Take 5 mg by mouth daily.     . Cholecalciferol (VITAMIN D) 125 MCG (5000 UT) CAPS Take 5,000 Units by mouth daily.     . Cyanocobalamin (VITAMIN B-12) 5000 MCG TBDP Take 5,000 mcg by mouth daily.     Marland Kitchen doxylamine, Sleep, (UNISOM) 25 MG tablet Take 25 mg by mouth at bedtime as needed for sleep.     Marland Kitchen gabapentin (NEURONTIN) 100 MG capsule Take 1 capsule (100 mg total) by mouth 3 (three) times daily. 90  capsule 11  . Krill Oil 500 MG CAPS Take 500 mg by mouth 3 (three) times a week.     Melene Muller ON 02/22/2020] lidocaine (LIDODERM) 5 % Place 1 patch onto the skin daily. Remove & Discard patch within 12 hours or as directed by MD 30 patch 0  . losartan (COZAAR) 50 MG tablet Take 50 mg by mouth 2 (two) times daily.     . metroNIDAZOLE (FLAGYL) 500 MG tablet Take 500 mg by mouth 2 (two) times daily. 10 DAYS     . Misc Natural Products (CYSTEX) LIQD Take 15 mLs by mouth daily as needed (for UTI).     . Multiple Vitamin (MULTIVITAMIN WITH MINERALS) TABS Take 1 tablet by mouth 3 (three) times a week.     . naproxen (NAPROSYN) 500 MG tablet dont restart until 12/11/2019    . omeprazole (PRILOSEC) 40 MG capsule Take 40 mg by mouth daily.    Marland Kitchen ORENCIA 125 MG/ML SOSY INJECT 125 MG INTO THE SKIN ONCE A WEEK. (Patient not taking: Reported on 02/08/2020) 12 mL 0  . oxyCODONE-acetaminophen (PERCOCET) 5-325 MG tablet Take 1 tablet by mouth every 6 (six) hours as needed for severe pain. (Patient not taking: Reported on 02/08/2020) 30 tablet 0  . vitamin E 400 UNIT capsule  Take 400 Units by mouth daily.     Marland Kitchen zolpidem (AMBIEN) 10 MG tablet Take 0.5 tablets (5 mg total) by mouth at bedtime as needed for sleep. (Patient taking differently: Take 5-10 mg by mouth at bedtime.) 10 tablet 0    Drug Regimen Review  Drug regimen was reviewed and remains appropriate with no significant issues identified  Home: Home Living Family/patient expects to be discharged to:: Private residence Living Arrangements: Spouse/significant other   Functional History:    Functional Status:  Mobility:          ADL:    Cognition: Cognition Orientation Level: Oriented X4     Blood pressure 122/72, pulse 79, temperature 98.3 F (36.8 C), temperature source Oral, resp. rate 18, height 5\' 3"  (1.6 m), weight 51.7 kg, SpO2 99 %. Physical Exam Vitals and nursing note reviewed. Exam conducted with a chaperone present.  Constitutional:      Appearance: Normal appearance.     Comments: Halo in place.  Daughter in law in room- and PA- pt sitting up ~ 45 degrees in bed; HALO in place, appropriate, higher cognition/processing slowed, NAD  HENT:     Head:     Comments: HALO attached with vest Smile equivocal; tongue midline    Right Ear: External ear normal.     Left Ear: External ear normal.     Nose: Nose normal. No congestion.     Mouth/Throat:     Mouth: Mucous membranes are dry.     Pharynx: Oropharynx is clear. No oropharyngeal exudate.  Eyes:     General:        Right eye: No discharge.        Left eye: No discharge.  Neck:     Comments: Posterior neck incision- has a lot of dried, but not new blood- sutures in place- no erythema Cardiovascular:     Rate and Rhythm: Normal rate and regular rhythm.     Heart sounds: Normal heart sounds. No murmur heard.   Pulmonary:     Comments: CTA b/L with decreased at bases B/L- no W/R/R Abdominal:     Comments: Very slightly distended- hypoactive; slightly TTP diffusely.   Musculoskeletal:  Comments: RUE deltoid,  biceps, triceps, WE, grip and finger abd 4/5- weaker than L LUE- stronger,( but hx of L wrist fx and L bad shoulder) at 5-/5 in same muscles tested- but painful LEs- 4+/5 HF, KE, DF and PF B/L  Skin:    General: Skin is warm and dry.     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     Comments: Right facial weakness. Verbose. Able to follow commands without difficulty.  Intact to light touch x4 extremities B/L Higher level cognition and processing slowed  Psychiatric:        Mood and Affect: Mood normal.        Behavior: Behavior normal.     Results for orders placed or performed during the hospital encounter of 02/13/20 (from the past 48 hour(s))  VITAMIN D 25 Hydroxy (Vit-D Deficiency, Fractures)     Status: None   Collection Time: 02/20/20  3:51 PM  Result Value Ref Range   Vit D, 25-Hydroxy 30.65 30 - 100 ng/mL    Comment: (NOTE) Vitamin D deficiency has been defined by the Institute of Medicine  and an Endocrine Society practice guideline as a level of serum 25-OH  vitamin D less than 20 ng/mL (1,2). The Endocrine Society went on to  further define vitamin D insufficiency as a level between 21 and 29  ng/mL (2).  1. IOM (Institute of Medicine). 2010. Dietary reference intakes for  calcium and D. [place]: [employer]. 2. Holick MF, Binkley Ingalls, Bischoff-Ferrari HA, et al. Evaluation,  treatment, and prevention of vitamin D deficiency: an Endocrine  Society clinical practice guideline, JCEM. 2011 Jul; 96(7): 1911-30.  Performed at [facility], [address] [place], [place] [address]    DG Abd 1 View  Result Date: 02/21/2020 CLINICAL DATA:  Constipation EXAM: ABDOMEN - 1 VIEW  COMPARISON:  None. FINDINGS: The bowel gas pattern is nonobstructive. There is an above average amount of stool throughout the colon. Degenerative changes are noted throughout the lumbar spine. There is no radiopaque kidney stone. IMPRESSION: 1. Nonobstructive bowel gas pattern. 2. Above average amount of stool throughout the colon. Electronically Signed   By: Katherine Mantle M.D.   On: 02/21/2020 19:30       Medical Problem List and Plan: 1.  Unstable C1/2 fx with deformity s/p HALO and C1-C5 posterior fusion and resulting mild weakness secondary to previous falls with impaired ADLs/mobility  -patient may not shower due to HALO right now  -ELOS/Goals: 14-16 days= mod I to supervision- lives with son/daughter in law x2 years 2.  Antithrombotics: -DVT/anticoagulation:  Pharmaceutical: Heparin  -antiplatelet therapy: N/A 3. Pain Management: On tylenol every 4 hours-->decrease to tid to avoid liver toxicity. Continue Oxycodone 10-15 mg every 4 hours prn. Pt reports pain is still 8/10 AT REST- and 10/10 with activity WITH meds 4. Mood: LCSW  -antipsychotic agents: N/a 5. Neuropsych: This patient is not fully capable of making decisions on her own behalf. 6. Skin/Wound Care: Routine pressure relief measures.  7. Fluids/Electrolytes/Nutrition:  Monitor I/O. Check lytes in am. Encourage fluid intake.  8.  Multiple sclerosis with peripheral neuropathy: Question cognitive impairment at baseline 9.  HTN: Monitor  blood pressures twice daily.  Continue Cozaar twice daily. 10.  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 11. Osteoporosis: Note prior authorization was submitted to [facility] for Forteo ( patient has declined this in the past). 11. Anemia/ Leucocytosis:  Will order post op labs in am.  12. H/o colitis and chronic intermittent LLQ pain/Constipation: No BM documented since admission. Will check KUB. Has been 7+ days    Evlyn Kanner. Love,  PA-C 02/21/2020   I have personally performed a face to face diagnostic evaluation of this patient and formulated the key components of the plan.  Additionally, I have personally reviewed laboratory data, imaging studies, as well as relevant notes and concur with the physician assistant's documentation above.       Genice Rouge, MD 02/21/2020

## 2020-02-21 NOTE — Progress Notes (Signed)
Neurosurgery Service Progress Note  Subjective: No acute events overnight, pain improved with addition of more APAP and lidocaine patches  Objective: Vitals:   02/20/20 0850 02/20/20 1505 02/20/20 1923 02/21/20 0409  BP: 132/80 (!) 123/47 117/84 137/73  Pulse: 91 89 87 86  Resp: 16 18 17 16   Temp: 98 F (36.7 C) (!) 97.5 F (36.4 C) 97.9 F (36.6 C) 98.2 F (36.8 C)  TempSrc: Oral Oral Oral Oral  SpO2: 99% 100% 98% 98%  Weight:      Height:        Physical Exam: Good alignment, in halo vest Strength 5/5x4, SILTx4 Incision c/d/i  Assessment & Plan: 79 y.o. woman s/p revision of construct with O-5 posterior fusion, POD1 XR with likely failure of inferior construct hardware with pullout of lateral mass screws. CT C-spine with hardware pullout, kyphotic deformity, 12/16 revision of hardware with O-T2 PSIF, 12/17 XR C-spine good position, 12/19 halo pins re-torqued  -pt has emergency allen key taped to chest piece, keep on at all times -activity as tolerated in halo -bacitracin bid to pin sites -PT/OT, SQH -pain control improved -appreciate Dr. 1/20 recs, will send vit D level and have pharmacy start the process to see if we can transition the fosamax to forteo -transfer to CIR today  Fatima Sanger  02/21/20 7:59 AM

## 2020-02-22 ENCOUNTER — Inpatient Hospital Stay (HOSPITAL_COMMUNITY): Payer: Medicare PPO | Admitting: Occupational Therapy

## 2020-02-22 ENCOUNTER — Inpatient Hospital Stay (HOSPITAL_COMMUNITY): Payer: Medicare PPO

## 2020-02-22 LAB — COMPREHENSIVE METABOLIC PANEL
ALT: 15 U/L (ref 0–44)
AST: 14 U/L — ABNORMAL LOW (ref 15–41)
Albumin: 2.9 g/dL — ABNORMAL LOW (ref 3.5–5.0)
Alkaline Phosphatase: 92 U/L (ref 38–126)
Anion gap: 12 (ref 5–15)
BUN: 10 mg/dL (ref 8–23)
CO2: 23 mmol/L (ref 22–32)
Calcium: 9 mg/dL (ref 8.9–10.3)
Chloride: 96 mmol/L — ABNORMAL LOW (ref 98–111)
Creatinine, Ser: 0.57 mg/dL (ref 0.44–1.00)
GFR, Estimated: >60 mL/min (ref >60)
Glucose, Bld: 102 mg/dL — ABNORMAL HIGH (ref 70–99)
Potassium: 4.2 mmol/L (ref 3.5–5.1)
Sodium: 131 mmol/L — ABNORMAL LOW (ref 135–145)
Total Bilirubin: 1.1 mg/dL (ref 0.3–1.2)
Total Protein: 5.8 g/dL — ABNORMAL LOW (ref 6.5–8.1)

## 2020-02-22 LAB — CBC WITH DIFFERENTIAL/PLATELET
Abs Immature Granulocytes: 0.04 10*3/uL (ref 0.00–0.07)
Basophils Absolute: 0 10*3/uL (ref 0.0–0.1)
Basophils Relative: 1 %
Eosinophils Absolute: 0.1 10*3/uL (ref 0.0–0.5)
Eosinophils Relative: 1 %
HCT: 34.4 % — ABNORMAL LOW (ref 36.0–46.0)
Hemoglobin: 11.6 g/dL — ABNORMAL LOW (ref 12.0–15.0)
Immature Granulocytes: 1 %
Lymphocytes Relative: 20 %
Lymphs Abs: 1.7 10*3/uL (ref 0.7–4.0)
MCH: 28 pg (ref 26.0–34.0)
MCHC: 33.7 g/dL (ref 30.0–36.0)
MCV: 83.1 fL (ref 80.0–100.0)
Monocytes Absolute: 0.9 10*3/uL (ref 0.1–1.0)
Monocytes Relative: 10 %
Neutro Abs: 5.9 10*3/uL (ref 1.7–7.7)
Neutrophils Relative %: 67 %
Platelets: 374 10*3/uL (ref 150–400)
RBC: 4.14 MIL/uL (ref 3.87–5.11)
RDW: 13.2 % (ref 11.5–15.5)
WBC: 8.6 10*3/uL (ref 4.0–10.5)
nRBC: 0 % (ref 0.0–0.2)

## 2020-02-22 MED ORDER — SORBITOL 70 % SOLN
15.0000 mL | Freq: Once | Status: AC
  Administered 2020-02-22: 15 mL via ORAL
  Filled 2020-02-22: qty 30

## 2020-02-22 NOTE — Progress Notes (Signed)
Inpatient Rehabilitation Care Coordinator Assessment and Plan Patient Details  Name: Michele Bryan MRN: [id number] Date of Birth: [birth year]  Today's Date: 02/22/2020  Hospital Problems: Principal Problem:   Cervical myelopathy [facility])  Past Medical History:  Past Medical History:  Diagnosis Date  . Anemia   . Arthritis   . GERD (gastroesophageal reflux disease)   . History of blood transfusion   . Hyperlipidemia   . Hypertension   . MS (multiple sclerosis) (HCC)   . Neuromuscular disorder (HCC)    multiple scleroosis/peripheral neuropathy  . PONV (postoperative nausea and vomiting)   . Rheumatoid arthritis (HCC)   . Ulcer   . Vitamin D deficiency    Past Surgical History:  Past Surgical History:  Procedure Laterality Date  . APPLICATION OF INTRAOPERATIVE CT SCAN N/A 02/16/2020   Procedure: APPLICATION OF INTRAOPERATIVE CT SCAN;  Surgeon: Jadene Pierini, MD;  Location: [facility] OR;  Service: Neurosurgery;  Laterality: N/A;  . AUGMENTATION MAMMAPLASTY    . BACK SURGERY    . breast augumentation    . BREAST SURGERY     biopsy  . COLONOSCOPY    . DILATION AND CURETTAGE OF UTERUS    . EYE SURGERY     both eys,cataracts  . goiter    . HALO APPLICATION N/A 02/16/2020   Procedure: HALO TRACTION APPLICATION;  Surgeon: Jadene Pierini, MD;  Location: [facility] OR;  Service: Neurosurgery;  Laterality: N/A;  . JOINT REPLACEMENT    . KNEE ARTHROSCOPY Right   . ORIF SHOULDER FRACTURE Left 03/05/2018   Procedure: OPEN REDUCTION INTERNAL FIXATION (ORIF) LEFT SCAPULA;  Surgeon: Beverely Low, MD;  Location: [facility] OR;  Service: Orthopedics;  Laterality: Left;  . POSTERIOR CERVICAL FUSION/FORAMINOTOMY N/A 12/06/2019   Procedure: Cervical one-two Posterior instrumented fusion;  Surgeon: Jadene Pierini, MD;  Location: [facility] OR;  Service: Neurosurgery;  Laterality: N/A;  . POSTERIOR CERVICAL FUSION/FORAMINOTOMY N/A 02/13/2020   Procedure: Revision of cervical instrumented fusion with Occiput to  Cervical Four posterior instrumented fusion;  Surgeon: Jadene Pierini, MD;  Location: [facility] OR;  Service: Neurosurgery;  Laterality: N/A;  posterior  . POSTERIOR CERVICAL FUSION/FORAMINOTOMY N/A 02/16/2020   Procedure: Occiput to Thoracic Two Posterior Cervical Fusion with AIRO and Application of Halo;  Surgeon: Jadene Pierini, MD;  Location: [facility] OR;  Service: Neurosurgery;  Laterality: N/A;  . REVERSE SHOULDER ARTHROPLASTY Left 05/15/2017   Procedure: LEFT REVERSE SHOULDER ARTHROPLASTY;  Surgeon: Beverely Low, MD;  Location: [facility] OR;  Service: Orthopedics;  Laterality: Left;  . REVERSE SHOULDER ARTHROPLASTY Left 10/11/2018   Procedure: left shoulder irrigation and debridement, open poly exchange and removal of painful hardware;  Surgeon: Beverely Low, MD;  Location: [facility] ORS;  Service: Orthopedics;  Laterality: Left;  . THYROID LOBECTOMY    . TOTAL KNEE ARTHROPLASTY Right 06/15/2012   Procedure: RIGHT TOTAL KNEE ARTHROPLASTY;  Surgeon: [name], MD;  Location: [facility] ORS;  Service: Orthopedics;  Laterality: Right;  . WRIST SURGERY Left    Social History:  reports that she has never smoked. She has never used smokeless tobacco. She reports that she does not drink alcohol and does not use drugs.  Family / Support Systems Marital Status: Married Patient Roles: Spouse,Parent Spouse/Significant Other: [name] [phone]-cell Children: Rob-son [phone] Other Supports: Daughter in-law can assist when husband is out Anticipated Caregiver: [name] Ability/Limitations of Caregiver: Working part time but plans to take a leave of abscence to be there with pt Caregiver Availability: 24/7 Family Dynamics: Close knit family  who will provide the care pt needs at discharge. They have always pulled together to be there for one another in times of need  Social History Preferred language: English Religion: Methodist Cultural Background: No issues Education: Automotive engineer educated Read: Yes Write:  Yes Employment Status: Retired Marine scientist Issues: No issues Guardian/Conservator: None-according to MD pt is capable of making her own decisions while here. Husband will be here daily and provide support   Abuse/Neglect Abuse/Neglect Assessment Can Be Completed: Yes Physical Abuse: Denies Verbal Abuse: Denies Sexual Abuse: Denies Exploitation of patient/patient's resources: Denies Self-Neglect: Denies  Emotional Status Pt's affect, behavior and adjustment status: Pt is motivated to be here and to recover from her surgery. She needs to learn how to walk with the halo on her since it throws off her balance. She is willing to do her part to improve and recover from this surgery Recent Psychosocial Issues: other health issues has been managing her other health issues but they do complicate her recovery Psychiatric History: No issues deferred depression screen due to coping appropriately at this time. Will continue to evaluate and have neruo-psych see if needed. Substance Abuse History: No issues  Patient / Family Perceptions, Expectations & Goals Pt/Family understanding of illness & functional limitations: Pt and husband can explain her surgery and limitations now. Both talk with the MD's involved and feel they have a good understanding of her treatment plan going forward. Husband reportrs he taught most of the MD's involved. Premorbid pt/family roles/activities: Wife, mom, grandmother, retiree, church member, etc Anticipated changes in roles/activities/participation: resume Pt/family expectations/goals: Pt states: " I hope to be able to do for myself I know I will need some assist with some things."  Husband states: " I plan to take a leave so I can be there with her, she will not be alone at home."  [employer]: Other (Comment) (had HH in the past) Premorbid Home Care/DME Agencies: Other (Comment) (has rollator, tub bench, wheelchair,  bsc) Transportation available at discharge: Husband and family  Discharge Planning Living Arrangements: Spouse/significant other Support Systems: Spouse/significant other,Children,Friends/neighbors,Other relatives,Church/faith community Type of Residence: Private residence Insurance Resources: Media planner (specify) Mining engineer Medicare) Financial Resources: Social Security,Family Support Financial Screen Referred: No Living Expenses: Own Money Management: Spouse,Patient Does the patient have any problems obtaining your medications?: No Home Management: Husband due to pt's health issues Patient/Family Preliminary Plans: Return home with husband who is taking a leave from his part time job and their son and daughter in-law can assist when he is not home. Pt iwll have 24/7 care at discharge. Care Coordinator Anticipated Follow Up Needs: HH/OP  Clinical Impression Pleasant female who is motivated to do well and recover from this surgery. Her husband and family are very involved and will be involved with her care at discharge. Will await team's evaluations and work on discharge needs. Will re-assess to see if would benefit from seeing neuro-psych while here.  [name] 02/22/2020, 9:09 AM

## 2020-02-22 NOTE — Progress Notes (Signed)
[facility] PHYSICAL MEDICINE & REHABILITATION PROGRESS NOTE   Subjective/Complaints: Abdominal XR shows constipation. Hgb 11.6 Pain is present but well controlled with medications Continues to have insomnia despite half of Ambien- discussed switching to alternative medication and husband was happy with this idea.   ROS: pain is well controlled with medications  Objective:   DG Abd 1 View  Result Date: 02/21/2020 CLINICAL DATA:  Constipation EXAM: ABDOMEN - 1 VIEW COMPARISON:  None. FINDINGS: The bowel gas pattern is nonobstructive. There is an above average amount of stool throughout the colon. Degenerative changes are noted throughout the lumbar spine. There is no radiopaque kidney stone. IMPRESSION: 1. Nonobstructive bowel gas pattern. 2. Above average amount of stool throughout the colon. Electronically Signed   By: Katherine Mantle M.D.   On: 02/21/2020 19:30   Recent Labs    02/22/20 0435  WBC 8.6  HGB 11.6*  HCT 34.4*  PLT 374   Recent Labs    02/22/20 0435  NA 131*  K 4.2  CL 96*  CO2 23  GLUCOSE 102*  BUN 10  CREATININE 0.57  CALCIUM 9.0    Intake/Output Summary (Last 24 hours) at 02/22/2020 1242 Last data filed at 02/22/2020 0757 Gross per 24 hour  Intake 360 ml  Output 550 ml  Net -190 ml        Physical Exam: Vital Signs Blood pressure (!) 149/68, pulse 92, temperature 97.9 F (36.6 C), resp. rate 16, height 5\' 3"  (1.6 m), weight 51.7 kg, SpO2 99 %. Gen: no distress, normal appearing HEENT: Posterior neck incision- has a lot of dried, but not new blood- sutures in place- no erythema. HALO attached with vest Cardio: Reg rate Chest: normal effort, normal rate of breathing Abd: soft, non-distended Ext: no edema Skin: intact Musculoskeletal:     Comments: RUE deltoid, biceps, triceps, WE, grip and finger abd 4/5- weaker than L LUE- stronger,( but hx of L wrist fx and L bad shoulder) at 5-/5 in same muscles tested- but painful LEs- 4+/5 HF, KE,  DF and PF B/L  Skin:    General: Skin is warm and dry.     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     Comments: Right facial weakness. Verbose. Able to follow commands without difficulty.  Intact to light touch x4 extremities B/L Higher level cognition and processing slowed  Psychiatric:        Mood and Affect: Mood normal.        Behavior: Behavior normal.     Assessment/Plan: 1. Functional deficits which require 3+ hours per day of interdisciplinary therapy in a comprehensive inpatient rehab setting.  Physiatrist is providing close team supervision and 24 hour management of active medical problems listed below.  Physiatrist and rehab team continue to assess barriers to discharge/monitor patient progress toward functional and medical goals  Care Tool:  Bathing    Body parts bathed by patient: Right arm,Left arm,Abdomen,Front perineal area,Right upper leg,Left upper leg,Face   Body parts bathed by helper: Buttocks,Right lower leg,Left lower leg Body parts n/a: Chest   Bathing assist Assist Level: Moderate Assistance - Patient 50 - 74%     Upper Body Dressing/Undressing Upper body dressing  What is the patient wearing?: Button up shirt    Upper body assist Assist Level: Maximal Assistance - Patient 25 - 49%    Lower Body Dressing/Undressing Lower body dressing      What is the patient wearing?: Underwear/pull up,Pants     Lower body assist Assist for lower body dressing: Minimal Assistance - Patient > 75%     Toileting Toileting    Toileting assist Assist for toileting: Moderate Assistance - Patient 50 - 74%     Transfers Chair/bed transfer  Transfers assist     Chair/bed transfer assist  level: Moderate Assistance - Patient 50 - 74%     Locomotion Ambulation   Ambulation assist      Assist level: Moderate Assistance - Patient 50 - 74% Assistive device: Other (comment) (HHA) Max distance: 76ft   Walk 10 feet activity   Assist     Assist level: Moderate Assistance - Patient - 50 - 74% Assistive device: Other (comment) (HHA)   Walk 50 feet activity   Assist    Assist level: Moderate Assistance - Patient - 50 - 74% Assistive device: Other (comment) (HHA)    Walk 150 feet activity   Assist Walk 150 feet activity did not occur: Safety/medical concerns (fatigue, weakness, decreased balance and endurance)         Walk 10 feet on uneven surface  activity   Assist Walk 10 feet on uneven surfaces activity did not occur: Safety/medical concerns (fatigue, weakness, decreased balance and endurance)         Wheelchair     Assist Will patient use wheelchair at discharge?: Yes Type of Wheelchair: Manual    Wheelchair assist level: Contact Guard/Touching assist Max wheelchair distance: 14ft    Wheelchair 50 feet with 2 turns activity    Assist        Assist Level: Moderate Assistance - Patient 50 - 74%   Wheelchair 150 feet activity     Assist      Assist Level: Maximal Assistance - Patient 25 - 49%   Blood pressure (!) 149/68, pulse 92, temperature 97.9 F (36.6 C), resp. rate 16, height  (1.6 m), weight 51.7 kg, SpO2 99 %.  Medical Problem List and Plan: 1.  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  2.  Antithrombotics: -DVT/anticoagulation:  Pharmaceutical: Heparin             -antiplatelet therapy: N/A 3. Pain Management:  On tylenol every 4 hours-->decrease to tid to avoid liver toxicity. Continue Oxycodone 10-15 mg every 4 hours prn. Pt reports pain is well controlled with medication: continue current regimen.  4. Mood: LCSW             -antipsychotic agents: N/a 5. Neuropsych: This patient is not fully capable of making decisions on her own behalf. 6. Skin/Wound Care: Routine pressure relief measures.  7. Fluids/Electrolytes/Nutrition:  Monitor I/O. Check lytes in am. Encourage fluid intake.  8.  Multiple sclerosis with peripheral neuropathy: Question cognitive impairment at baseline 9.  HTN: Monitor blood pressures twice daily.  Systolic BP elevated, but has been labile.  Continue Cozaar twice daily. 10.  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 11. Osteoporosis: Note prior authorization was submitted to [facility] for Forteo ( patient has declined this in the past). 11. Anemia/ Leucocytosis:  12/22 normalized.  12. H/o colitis and chronic intermittent LLQ pain/Constipation: No BM documented since admission.KUB shows constipation. Sorbitol administered.   LOS: 1 days A FACE TO FACE EVALUATION WAS PERFORMED  Drema Pry Jacora Hopkins 02/22/2020, 12:42 PM

## 2020-02-22 NOTE — Telephone Encounter (Signed)
Ran test claim, 1 month of Forteo has a $100.00 copay. Patient will also need prescription for pen needles for medication.  Patient has declined patient assistance in the past, but if she changes her mind she can apply for [employer] patient assistance for [employer].

## 2020-02-22 NOTE — Telephone Encounter (Signed)
Claim is not processing yet. Will call insurance if claim does not go through by end of day.

## 2020-02-22 NOTE — Evaluation (Signed)
Physical Therapy Assessment and Plan  Patient Details  Name: Michele Bryan MRN: [id number] Date of Birth: [birth year]  PT Diagnosis: Abnormal posture, Abnormality of gait, Cognitive deficits, Coordination disorder, Difficulty walking, Impaired cognition, Muscle weakness and Pain in posterior neck/back Rehab Potential: Good ELOS: 10-14 days   Today's Date: 02/22/2020 PT Individual Time: 0930-1027 PT Individual Time Calculation (min): 57 min    Hospital Problem: Principal Problem:   Cervical myelopathy ([place])   Past Medical History:  Past Medical History:  Diagnosis Date  . Anemia   . Arthritis   . GERD (gastroesophageal reflux disease)   . History of blood transfusion   . Hyperlipidemia   . Hypertension   . MS (multiple sclerosis) ([place])   . Neuromuscular disorder ([place])    multiple scleroosis/peripheral neuropathy  . PONV (postoperative nausea and vomiting)   . Rheumatoid arthritis ([place])   . Ulcer   . Vitamin D deficiency    Past Surgical History:  Past Surgical History:  Procedure Laterality Date  . APPLICATION OF INTRAOPERATIVE CT SCAN N/A 02/16/2020   Procedure: APPLICATION OF INTRAOPERATIVE CT SCAN;  Surgeon: Judith Part, MD;  Location: [place];  Service: Neurosurgery;  Laterality: N/A;  . AUGMENTATION MAMMAPLASTY    . BACK SURGERY    . breast augumentation    . BREAST SURGERY     biopsy  . COLONOSCOPY    . DILATION AND CURETTAGE OF UTERUS    . EYE SURGERY     both eys,cataracts  . goiter    . HALO APPLICATION N/A 65/68/1275   Procedure: HALO TRACTION APPLICATION;  Surgeon: Judith Part, MD;  Location: [place];  Service: Neurosurgery;  Laterality: N/A;  . JOINT REPLACEMENT    . KNEE ARTHROSCOPY Right   . ORIF SHOULDER FRACTURE Left 03/05/2018   Procedure: OPEN REDUCTION INTERNAL FIXATION (ORIF) LEFT SCAPULA;  Surgeon: Netta Cedars, MD;  Location: [place];  Service: Orthopedics;  Laterality: Left;  . POSTERIOR CERVICAL FUSION/FORAMINOTOMY N/A 12/06/2019    Procedure: Cervical one-two Posterior instrumented fusion;  Surgeon: Judith Part, MD;  Location: [place];  Service: Neurosurgery;  Laterality: N/A;  . POSTERIOR CERVICAL FUSION/FORAMINOTOMY N/A 02/13/2020   Procedure: Revision of cervical instrumented fusion with Occiput to Cervical Four posterior instrumented fusion;  Surgeon: Judith Part, MD;  Location: [place];  Service: Neurosurgery;  Laterality: N/A;  posterior  . POSTERIOR CERVICAL FUSION/FORAMINOTOMY N/A 02/16/2020   Procedure: Occiput to Thoracic Two Posterior Cervical Fusion with AIRO and Application of Halo;  Surgeon: Judith Part, MD;  Location: [place];  Service: Neurosurgery;  Laterality: N/A;  . REVERSE SHOULDER ARTHROPLASTY Left 05/15/2017   Procedure: LEFT REVERSE SHOULDER ARTHROPLASTY;  Surgeon: Netta Cedars, MD;  Location: [place];  Service: Orthopedics;  Laterality: Left;  . REVERSE SHOULDER ARTHROPLASTY Left 10/11/2018   Procedure: left shoulder irrigation and debridement, open poly exchange and removal of painful hardware;  Surgeon: Netta Cedars, MD;  Location: [facility];  Service: Orthopedics;  Laterality: Left;  . THYROID LOBECTOMY    . TOTAL KNEE ARTHROPLASTY Right 06/15/2012   Procedure: RIGHT TOTAL KNEE ARTHROPLASTY;  Surgeon: Mauri Pole, MD;  Location: [facility];  Service: Orthopedics;  Laterality: Right;  . WRIST SURGERY Left     Assessment & Plan Clinical Impression: Patient is a 79 y.o. year old female with a history of fracture s/p C1-2 posterior instrumented fusion. Presented on 12/13 with recurrence of her chin on chest deformity with worsening pain and new dysphagia. CT confirmed  fracture of the bone around her C2 screws. She presented for revision of her hardware and extension of her instrumented fusion. Patient also with history of arthritis, GERD, HTN, multiple sclerosis, peripheral neuropathy and vitamin D deficiency.    Underwent occiput to C 5 posterior instrumented fusion, revision of prior C  1-2 fusion on 02/13/2020.Postoperatively CT confirmed failure of inferior construct hardware will pullout of lateral mass screws, kyphotic deformity. On 12/16 underwent revision of occipitocervical fusion, extension of fusion form occiput to T2, attempted open reduction of C 1-2 fracture, use of intraop CT with navigation, application of halo frame and vest.  postoperatively delirium resolved. Halo pins re-torqued on 12/17 by Dr. Zada Finders. Heparin SQ for DVT prophylaxis, bacitracin to pin sites with [facility] emergency key taped to chest at all times. Postop pain management with med changes as needed. Plans to transition the fosamax to forteo per Dr. Bo Merino recommendation.  Patient currently requires min with mobility secondary to muscle weakness, decreased memory and decreased standing balance, decreased postural control and decreased balance strategies.  Prior to hospitalization, patient was modified independent  with mobility and lived with Spouse in a House home.  Home access is 1Stairs to enter.  Patient will benefit from skilled PT intervention to maximize safe functional mobility, minimize fall risk and decrease caregiver burden for planned discharge home with 24 hour supervision.  Anticipate patient will benefit from follow up [place] at discharge.  PT - End of Session Activity Tolerance: Tolerates 30+ min activity with multiple rests Endurance Deficit: Yes Endurance Deficit Description: pt required rest breaks throughout session PT Assessment Rehab Potential (ACUTE/IP ONLY): Good PT Barriers to Discharge: [place] home environment;Home environment access/layout;Wound Care;Other (comments) PT Barriers to Discharge Comments: Halo precautions, 1 STE with no rails, mild congitive impairments PT Patient demonstrates impairments in the following area(s): Balance;Endurance;Motor;Pain;Skin Integrity PT Transfers Functional Problem(s): Bed Mobility;Bed to Chair;Car;Furniture PT Locomotion  Functional Problem(s): Ambulation;Wheelchair Mobility;Stairs PT Plan PT Intensity: Minimum of 1-2 x/day ,45 to 90 minutes PT Frequency: 5 out of 7 days PT Duration Estimated Length of Stay: 10-14 days PT Treatment/Interventions: Ambulation/gait training;Discharge planning;Functional mobility training;Psychosocial support;Therapeutic Activities;Balance/vestibular training;Disease management/prevention;Neuromuscular re-education;Skin care/wound management;Therapeutic Exercise;Wheelchair propulsion/positioning;Cognitive remediation/compensation;DME/adaptive equipment instruction;Pain management;Splinting/orthotics;UE/LE Strength taining/ROM;Community reintegration;Functional electrical stimulation;Patient/family education;Stair training;UE/LE Coordination activities PT Transfers Anticipated Outcome(s): supervision with LRAD PT Locomotion Anticipated Outcome(s): supervision with LRAD PT Recommendation Follow Up Recommendations: Home health PT Patient destination: Home Equipment Recommended: To be determined Equipment Details: pt has rollator and [facility]  PT Evaluation Precautions/Restrictions Precautions Precautions: Cervical;Fall Required Braces or Orthoses: Other Brace Cervical Brace: Other (comment) Other Brace: Halo Restrictions Weight Bearing Restrictions: No Home Living/Prior Functioning Home Living Living Arrangements: Spouse/significant other Available Help at Discharge: Family;Available 24 hours/day Type of Home: House Home Access: Stairs to enter [employer] of Steps: 1 Entrance Stairs-Rails: None (husband plans to install grab bar) Home Layout: Two level;Able to live on main level with bedroom/bathroom Alternate Level Stairs-Number of Steps: flight Alternate Level Stairs-Rails: Left;Right Bathroom Shower/Tub: Multimedia programmer: Handicapped height Bathroom Accessibility: Yes Additional Comments: grab bars and built in seats as well as free standing seat  in shower, has 3 in1, used cane intermittently  Lives With: Spouse Prior Function Level of Independence: Requires assistive device for independence;Independent with basic ADLs;Independent with transfers;Independent with homemaking with ambulation;Independent with gait  Able to Take Stairs?: Yes Driving: No Vocation: Retired Comments: occasionally used rollator and SPC mainly at night when going to bathroom. Cognition Overall Cognitive Status: Impaired/Different from baseline Arousal/Alertness: Awake/alert Orientation  Level: Oriented X4 Memory: Impaired Awareness: Appears intact Problem Solving: Appears intact Safety/Judgment: Appears intact Sensation Sensation Light Touch: Appears Intact Proprioception: Impaired by gross assessment Additional Comments: pt appears unsteady in standing Coordination Gross Motor Movements are Fluid and Coordinated: No Fine Motor Movements are Fluid and Coordinated: Yes Coordination and Movement Description: grossly uncoordinated due to generalized weakness, pain, decreased standing balance/coordination, and decreased endurance Finger Nose Finger Test: WFL bilaterally, decreased shoulder ROM on LUE Heel Shin Test: [facility] bilaterally Motor  Motor Motor: Abnormal postural alignment and control Motor - Skilled Clinical Observations: pt unsteady in standing  Trunk/Postural Assessment  Cervical Assessment Cervical Assessment: Exceptions to [facility] (mild forward head due to weight of Halo) Thoracic Assessment Thoracic Assessment: Exceptions to [facility] (rounded shoulders due to weight of Halo) Lumbar Assessment Lumbar Assessment: Within Functional Limits Postural Control Postural Control: Deficits on evaluation  Balance Balance Balance Assessed: Yes Static Sitting Balance Static Sitting - Balance Support: Feet supported;Bilateral upper extremity supported Static Sitting - Level of Assistance: 5: Stand by assistance (supervision) Dynamic Sitting  Balance Dynamic Sitting - Balance Support: Feet supported;Bilateral upper extremity supported (supervision) Static Standing Balance Static Standing - Balance Support: No upper extremity supported Static Standing - Level of Assistance: 5: Stand by assistance (CGA) Dynamic Standing Balance Dynamic Standing - Balance Support: No upper extremity supported Dynamic Standing - Level of Assistance: 4: Min assist Extremity Assessment  RLE Assessment RLE Assessment: Exceptions to [facility] General Strength Comments: grossly generalized to 4-/5 LLE Assessment LLE Assessment: Exceptions to [facility] General Strength Comments: grossly generalized to 4-/5  Care Tool Care Tool Bed Mobility Roll left and right activity   Roll left and right assist level: Contact Guard/Touching assist    Sit to lying activity        Lying to sitting edge of bed activity   Lying to sitting edge of bed assist level: Contact Guard/Touching assist     Care Tool Transfers Sit to stand transfer   Sit to stand assist level: Minimal Assistance - Patient > 75%    Chair/bed transfer   Chair/bed transfer assist level: Minimal Assistance - Patient > 75%     Physiological scientist transfer assist level: Minimal Assistance - Patient > 75%      Care Tool Locomotion Ambulation   Assist level: Moderate Assistance - Patient 50 - 74% Assistive device: Other (comment) (HHA) Max distance: 65f  Walk 10 feet activity   Assist level: Moderate Assistance - Patient - 50 - 74% Assistive device: Other (comment) (HHA)   Walk 50 feet with 2 turns activity   Assist level: Moderate Assistance - Patient - 50 - 74% Assistive device: Other (comment) (HHA)  Walk 150 feet activity Walk 150 feet activity did not occur: Safety/medical concerns (fatigue, weakness, decreased balance and endurance)      Walk 10 feet on uneven surfaces activity Walk 10 feet on uneven surfaces activity did not occur: Safety/medical concerns  (fatigue, weakness, decreased balance and endurance)      Stairs   Assist level: Moderate Assistance - Patient - 50 - 74% Stairs assistive device: 2 hand rails Max number of stairs: 4  Walk up/down 1 step activity   Walk up/down 1 step (curb) assist level: Moderate Assistance - Patient - 50 - 74% Walk up/down 1 step or curb assistive device: 2 hand rails    Walk up/down 4 steps activity Walk up/down 4 steps assist level: Moderate Assistance -  Patient - 50 - 74% Walk up/down 4 steps assistive device: 2 hand rails  Walk up/down 12 steps activity Walk up/down 12 steps activity did not occur: Safety/medical concerns (fatigue, weakness, decreased balance and endurance)      Pick up small objects from floor Pick up small object from the floor (from standing position) activity did not occur: Safety/medical concerns (decreased balance and endurance, Halo precautions)      Wheelchair Will patient use wheelchair at discharge?: Yes Type of Wheelchair: Manual   Wheelchair assist level: Contact Guard/Touching assist Max wheelchair distance: 15f  Wheel 50 feet with 2 turns activity   Assist Level: Moderate Assistance - Patient 50 - 74%  Wheel 150 feet activity   Assist Level: Maximal Assistance - Patient 25 - 49%    Refer to Care Plan for Long Term Goals  SHORT TERM GOAL WEEK 1 PT Short Term Goal 1 (Week 1): pt will perform bed mobility with supervision PT Short Term Goal 2 (Week 1): pt will transfer bed<>chair with LRAD CGA PT Short Term Goal 3 (Week 1): Pt will ambulate 539fwith LRAD CGA  Recommendations for other services: None   Skilled Therapeutic Intervention Evaluation completed (see details above and below) with education on PT POC and goals and individual treatment initiated with focus on functional mobility/transfers, generalized strengthening, dynamic standing balance/coordination, ambulation, simulated car transfers,stair navigation, and improved activity tolerance. Received  pt supine in bed with husband CaGlendell Dockerresent at bedside, pt educated on PT evaluation, CIR policies, and therapy schedule and agreeable. Pt reported pain 10/10 in posterior neck/back (premedicated). Repositioning and rest breaks done to reduce pain levels. Pt transferred supine<>sitting EOB with HOB elevated with CGA. Pt transferred stand<>pivot bed<>WC without AD and min A with cues for turning technique. Pt performed WC mobility [id number] BLE and RUE with CGA and cues for propulsion technique. Pt transported remainder of way to ortho gym [facility] and performed simulated car transfer without AD and min A with cues for safe entry as pt attempting to side step into car. Pt then ambulated [id number] AD and mod handheld assist. Pt demonstrated narrow BOS, decreased stride length, and decreased bilateral foot clearance. Pt appears unsteady overall with static/dynamic standing. Pt navigated 4 steps with 2 rails mod A ascending and descending with a step through pattern. Pt reported lightheadedness when turning but symptoms resolved in <2 minutes. [facility] and requested to stay in WC. [place] session with pt sitting in WC, needs within reach, and seatbelt alarm on. Husband present at bedside.   Mobility Bed Mobility Bed Mobility: Rolling Right;Supine to Sit Rolling Right: Contact Guard/Touching assist Supine to Sit: Contact Guard/Touching assist Transfers Transfers: Sit to Stand;Stand to Sit;Stand Pivot Transfers Sit to Stand: Minimal Assistance - Patient > 75% Stand to Sit: Minimal Assistance - Patient > 75% Stand Pivot Transfers: Minimal Assistance - Patient > 75% Stand Pivot Transfer Details: Verbal cues for sequencing;Verbal cues for technique;Verbal cues for precautions/safety Stand Pivot Transfer Details (indicate cue type and reason): verbal cues for transfer technique when turning Transfer (Assistive device): None Locomotion  Gait Ambulation: Yes Gait  Assistance: Moderate Assistance - Patient 50-74% Gait Distance (Feet): 50 Feet Assistive device: 1 person hand held assist Gait Assistance Details: Verbal cues for technique;Verbal cues for sequencing;Verbal cues for gait pattern Gait Assistance Details: verbal cues to increase step length and for safety when turning Gait Gait: Yes Gait Pattern: Impaired Gait Pattern: Step-to pattern;Decreased  trunk rotation;Decreased stride length;Decreased step length - right;Decreased step length - left;Poor foot clearance - left;Poor foot clearance - right;Narrow base of support Gait velocity: Decreased Stairs / Additional Locomotion Stairs: Yes Stairs Assistance: Moderate Assistance - Patient 50 - 74% Stair Management Technique: Two rails Number of Stairs: 4 Height of Stairs: 6 Wheelchair Mobility Wheelchair Mobility: Yes Wheelchair Assistance: Development worker, international aid: Both lower extermities;Right upper extremity Wheelchair Parts Management: Needs assistance Distance: 57f  Discharge Criteria: Patient will be discharged from PT if patient refuses treatment 3 consecutive times without medical reason, if treatment goals not met, if there is a change in medical status, if patient makes no progress towards goals or if patient is discharged from hospital.  The above assessment, treatment plan, treatment alternatives and goals were discussed and mutually agreed upon: by patient and by family  AAlfonse AlpersPT, DPT  02/22/2020, 12:04 PM

## 2020-02-22 NOTE — Progress Notes (Signed)
Inpatient Rehabilitation Center Individual Statement of Services  Patient Name:  Michele Bryan  Date:  02/22/2020  Welcome to the Inpatient Rehabilitation Center.  Our goal is to provide you with an individualized program based on your diagnosis and situation, designed to meet your specific needs.  With this comprehensive rehabilitation program, you will be expected to participate in at least 3 hours of rehabilitation therapies Monday-Friday, with modified therapy programming on the weekends.  Your rehabilitation program will include the following services:  Physical Therapy (PT), Occupational Therapy (OT), 24 hour per day rehabilitation nursing, Care Coordinator, Rehabilitation Medicine, Nutrition Services and Pharmacy Services  Weekly team conferences will be held on Wednesday to discuss your progress.  Your Inpatient Rehabilitation Care Coordinator will talk with you frequently to get your input and to update you on team discussions.  Team conferences with you and your family in attendance may also be held.  Expected length of stay: 10-14 days  Overall anticipated outcome: supervision with cueing  Depending on your progress and recovery, your program may change. Your Inpatient Rehabilitation Care Coordinator will coordinate services and will keep you informed of any changes. Your Inpatient Rehabilitation Care Coordinator's name and contact numbers are listed  below.  The following services may also be recommended but are not provided by the Inpatient Rehabilitation Center:    Home Health Rehabiltiation Services  Outpatient Rehabilitation Services    Arrangements will be made to provide these services after discharge if needed.  Arrangements include referral to agencies that provide these services.  Your insurance has been verified to be:  [employer] Your primary doctor is:  Sigmund Hazel  Pertinent information will be shared with your doctor and your insurance  company.  Inpatient Rehabilitation Care Coordinator:  Dossie Der, [place] [phone] or Luna [place]  Information discussed with and copy given to patient by: [name], 02/22/2020, 9:11 AM

## 2020-02-22 NOTE — Patient Care Conference (Signed)
Inpatient RehabilitationTeam Conference and Plan of Care Update Date: 02/22/2020   Time: 11:47 AM    Patient Name: Michele Bryan      Medical Record Number: [id number]  Date of Birth: April 18, 1940 Sex: Female         Room/Bed: 4W01C/4W01C-01 Payor Info: Payor: [employer] MEDICARE / Plan: [employer] MEDICARE CHOICE PPO / Product Type: *No Product type* /    Admit Date/Time:  02/21/2020  3:44 PM  Primary Diagnosis:  Cervical myelopathy [facility])  Hospital Problems: Principal Problem:   Cervical myelopathy [facility])    Expected Discharge Date: Expected Discharge Date:  (10-14 days; TBD)  Team Members Present: Physician leading conference: Dr. Sula Soda Care Coodinator Present: Chana Bode, RN, BSN, CRRN;Becky Dupree, LCSW Nurse Present: Margot Ables, LPN PT Present: Raechel Chute, PT OT Present: Rosalio Loud, OT PPS Coordinator present : Fae Pippin, [place], PT     Current Status/Progress Goal Weekly Team Focus  Bowel/Bladder   Continent B/B. LBM 02/13/20  maintain continence  relieve constipation   Swallow/Nutrition/ Hydration             ADL's   Min-mod assist sit > stand, min assist bathing, min assist UB dressing, mod assist LB dressing  Supervision overall  ADL retraining, functional transfers, dynamic standing balance, endurance   Mobility   bed mobility CGA, transfers without AD and min A, gait 64ft without AD mod A, 4 steps 2 rails mod A, WC mobility 30ft with CGA.  supervision  functional mobility/transfers, generalized strengthening, dynamic standing balance/coordination, ambulation, stair navigation, endurance.   Communication             Safety/Cognition/ Behavioral Observations            Pain   c/c pain 10/10. Bottom of brace hits pelic bone while in bed  reduce pain to a tolerable level  manage pain without over sedating   Skin   c-spine sutures intact  prevent wound related complications  monitor qshift     Discharge Planning:  Home with husband  who can assist, new evaluate today.   Team Discussion: Patient with Halo brace reporting pain issues; MD to adjust medications. Supportive family  Patient on target to meet rehab goals: Currently CGA-Min assist for transfers and min-mod assist for toileting and CGA for lower body dressing with supervision goals set  *See Care Plan and progress notes for long and short-term goals.   Revisions to Treatment Plan:   Teaching Needs: Halo care, transfers, toileting, medications, etc.   Current Barriers to Discharge: Decreased caregiver support  Possible Resolutions to Barriers: Family education     Medical Summary Current Status: Halo in place, constipated, insomnia, incision  Barriers to Discharge: Medical stability;Wound care  Barriers to Discharge Comments: constipated, insomnia, surgical incision Possible Resolutions to Barriers/Weekly Focus: sorbitol today, patient willing to transition to trazodone or amitriptyline tomorrow, daily wound care       I attest that I was present, lead the team conference, and concur with the assessment and plan of the team.   Chana Bode B 02/22/2020, 2:30 PM

## 2020-02-22 NOTE — Telephone Encounter (Addendum)
Received notification from [facility] regarding a prior authorization for FORTEO 637mcg/2.4 mL (good for 28 days per pen). Authorization has been APPROVED from 02/21/20 to 03/02/21.   PA Case: [id number] Phone # [phone]  Chesley Mires, PharmD, MPH Clinical Pharmacist (Rheumatology and Pulmonology)

## 2020-02-22 NOTE — Progress Notes (Signed)
Physical Therapy Session Note  Patient Details  Name: Michele Bryan MRN: [id number] Date of Birth: [birth year]  Today's Date: 02/22/2020 PT Individual Time: 1300-1413 PT Individual Time Calculation (min): 73 min   Short Term Goals: Week 1:  PT Short Term Goal 1 (Week 1): pt will perform bed mobility with supervision PT Short Term Goal 2 (Week 1): pt will transfer bed<>chair with LRAD CGA PT Short Term Goal 3 (Week 1): Pt will ambulate 25ft with LRAD CGA  Skilled Therapeutic Interventions/Progress Updates:   Received pt sitting upright in bed, pt agreeable to therapy, and reported pain 10/10 in posterior neck and back. Pt also c/o sore throat. RN notified and present to administer medications and throat spray. Pt's husband with concerns regarding if pt needed a Covid tests due to sore throat. Explained to pt/husband that pt received Covid test prior to coming to unit; pt's husband expressed relief. Repositioning, rest breaks, and distraction done to reduce neck pain levels. Session with emphasis on functional mobility/transfers, toileting, generalized strengthening, dynamic standing balance/coordination, and improved activity tolerance. Pt requested therapist take temperature as pt throughout she may have a fever. Temp 99.3. Pt reported urge to use restroom and transferred supine<>sitting EOB with supervision with HOB elevated and transferred sit<>stand without AD and min A. Attempted to ambulate to bathroom however pt reported sudden increase in lightheadedness; therefore returned to sitting and brought bedside commode over to bed. Pt transferred bed<>bedside commode with RW and min A. Pt able to doff pants/underwear with CGA for balance. Pt able to void and perform peri-care with close supervision. Pt required mod A to pull underwear/pants over hips and transferred WC<>bed stand<>pivot with RW and CGA.  BP sitting EOB: 135/78 BP standing: 121/62 Pt reported not eating any lunch today due to  difficulty swallowing food, but stated she ate some of english muffin this morning for breakfast. RN made aware. Pt transported to ortho gym in [facility] total A. Therapist switched out current WC for 16x16 WC with foam cushion for improved comfort/fit. Pt transported to dayroom in [facility] total A and worked on fine motor, problem solving, and attention creating Christmas card for husband Michele Bryan. Pt transported back to room in [facility] total A. Concluded session with pt sitting in [facility] finishing card, needs within reach, and seatbelt alarm on. Husband Michele Bryan present at bedside.   Therapy Documentation Precautions:  Restrictions Weight Bearing Restrictions: No   Therapy/Group: Individual Therapy Martin Majestic PT, DPT   02/22/2020, 7:32 AM

## 2020-02-22 NOTE — Evaluation (Signed)
Occupational Therapy Assessment and Plan  Patient Details  Name: Michele Bryan MRN: [id number] Date of Birth: 1940-11-25  OT Diagnosis: abnormal posture, acute pain, cognitive deficits and pain in neck and shoulders Rehab Potential: Rehab Potential (ACUTE ONLY): Good ELOS: 10-14 days   Today's Date: 02/22/2020 OT Individual Time: 3532-9924 OT Individual Time Calculation (min): 57 min     Hospital Problem: Principal Problem:   Cervical myelopathy ([place])   Past Medical History:  Past Medical History:  Diagnosis Date  . Anemia   . Arthritis   . GERD (gastroesophageal reflux disease)   . History of blood transfusion   . Hyperlipidemia   . Hypertension   . MS (multiple sclerosis) ([place])   . Neuromuscular disorder ([place])    multiple scleroosis/peripheral neuropathy  . PONV (postoperative nausea and vomiting)   . Rheumatoid arthritis ([place])   . Ulcer   . Vitamin D deficiency    Past Surgical History:  Past Surgical History:  Procedure Laterality Date  . APPLICATION OF INTRAOPERATIVE CT SCAN N/A 02/16/2020   Procedure: APPLICATION OF INTRAOPERATIVE CT SCAN;  Surgeon: Judith Part, MD;  Location: [place];  Service: Neurosurgery;  Laterality: N/A;  . AUGMENTATION MAMMAPLASTY    . BACK SURGERY    . breast augumentation    . BREAST SURGERY     biopsy  . COLONOSCOPY    . DILATION AND CURETTAGE OF UTERUS    . EYE SURGERY     both eys,cataracts  . goiter    . HALO APPLICATION N/A 26/83/4196   Procedure: HALO TRACTION APPLICATION;  Surgeon: Judith Part, MD;  Location: [place];  Service: Neurosurgery;  Laterality: N/A;  . JOINT REPLACEMENT    . KNEE ARTHROSCOPY Right   . ORIF SHOULDER FRACTURE Left 03/05/2018   Procedure: OPEN REDUCTION INTERNAL FIXATION (ORIF) LEFT SCAPULA;  Surgeon: Netta Cedars, MD;  Location: [place];  Service: Orthopedics;  Laterality: Left;  . POSTERIOR CERVICAL FUSION/FORAMINOTOMY N/A 12/06/2019   Procedure: Cervical one-two Posterior instrumented  fusion;  Surgeon: Judith Part, MD;  Location: [place];  Service: Neurosurgery;  Laterality: N/A;  . POSTERIOR CERVICAL FUSION/FORAMINOTOMY N/A 02/13/2020   Procedure: Revision of cervical instrumented fusion with Occiput to Cervical Four posterior instrumented fusion;  Surgeon: Judith Part, MD;  Location: [place];  Service: Neurosurgery;  Laterality: N/A;  posterior  . POSTERIOR CERVICAL FUSION/FORAMINOTOMY N/A 02/16/2020   Procedure: Occiput to Thoracic Two Posterior Cervical Fusion with AIRO and Application of Halo;  Surgeon: Judith Part, MD;  Location: [place];  Service: Neurosurgery;  Laterality: N/A;  . REVERSE SHOULDER ARTHROPLASTY Left 05/15/2017   Procedure: LEFT REVERSE SHOULDER ARTHROPLASTY;  Surgeon: Netta Cedars, MD;  Location: [place];  Service: Orthopedics;  Laterality: Left;  . REVERSE SHOULDER ARTHROPLASTY Left 10/11/2018   Procedure: left shoulder irrigation and debridement, open poly exchange and removal of painful hardware;  Surgeon: Netta Cedars, MD;  Location: [facility];  Service: Orthopedics;  Laterality: Left;  . THYROID LOBECTOMY    . TOTAL KNEE ARTHROPLASTY Right 06/15/2012   Procedure: RIGHT TOTAL KNEE ARTHROPLASTY;  Surgeon: Mauri Pole, MD;  Location: [facility];  Service: Orthopedics;  Laterality: Right;  . WRIST SURGERY Left     Assessment & Plan Clinical Impression: Patient is a 79 y.o. year old female with history of HTN, MS--no medications/last flare 3 years (Dr. Krista Blue), RA, anemia, fall 03/2018 with neck pain and found to have C1/C2 fracture with displacement requiring fusion 12/06/19. She developed recurrent  pian with dysphagia and was found to have hardware failure with loosening of C2 screws and chin on chest deformity. She was admitted on  02/13/20 for revision with occiput to C5 posterior fusion by Dr. Zada Finders.  Post op was doing well but on 12/15 develop increase neck pain with worsened posture. CT neck done revealing fracture of C5 facet with  concerns for hardware pull out. She was taken back to OR for revision with extension of fusion from occiput to T2 with attempted open reducation of C1/C2 and placed in Halo for support. Post op, she has had issues with hallucinations with headaches and shoulder pain.      She continuesl requiring IV Dilaudid with prn oxycodone. Tylenol added every 4 hours with lidocaine for local measures.  She has had issues with confusion, difficulty problem-solving and requiring cues to maintain precautions.  Therapy has been ongoing and patient continues to have limitations due to pain, weakness with posterior bias when standing or mobile, has cognitive deficits with poor safety awareness and needs increased time to follow simple one-step commands.  CIR was recommended due to functional decline.  Patient transferred to CIR on 02/21/2020 .    Patient currently requires mod with basic self-care skills secondary to muscle weakness, decreased memory and decreased standing balance, decreased postural control and pain and lightheadedness.  Prior to hospitalization, patient could complete ADLs with modified independent .  Patient will benefit from skilled intervention to decrease level of assist with basic self-care skills prior to discharge home with care partner.  Anticipate patient will require 24 hour supervision and follow up home health.  OT - End of Session Activity Tolerance: Tolerates 30+ min activity with multiple rests Endurance Deficit: Yes Endurance Deficit Description: pt required rest breaks throughout session OT Assessment Rehab Potential (ACUTE ONLY): Good OT Patient demonstrates impairments in the following area(s): Balance;Endurance;Pain;Safety OT Basic ADL's Functional Problem(s): Grooming;Bathing;Dressing;Toileting OT Transfers Functional Problem(s): Toilet OT Additional Impairment(s): None OT Plan OT Intensity: Minimum of 1-2 x/day, 45 to 90 minutes OT Frequency: 5 out of 7 days OT  Duration/Estimated Length of Stay: 10-14 days OT Treatment/Interventions: Balance/vestibular training;Cognitive remediation/compensation;Community reintegration;Discharge planning;Disease mangement/prevention;DME/adaptive equipment instruction;Functional mobility training;Pain management;Patient/family education;Psychosocial support;Self Care/advanced ADL retraining;Splinting/orthotics;Therapeutic Activities;Therapeutic Exercise;UE/LE Strength taining/ROM OT Basic Self-Care Anticipated Outcome(s): Supervision OT Toileting Anticipated Outcome(s): Supervision OT Bathroom Transfers Anticipated Outcome(s): Supervision OT Recommendation Recommendations for Other Services: Therapeutic Recreation consult Patient destination: Home Follow Up Recommendations: Home health OT;24 hour supervision/assistance Equipment Recommended: None recommended by OT   OT Evaluation Precautions/Restrictions  Precautions Precautions: Cervical;Fall Required Braces or Orthoses: Other Brace Cervical Brace: Other (comment) Other Brace: Halo Restrictions Weight Bearing Restrictions: No General   Vital Signs  Pain Pain Assessment Pain Scale: 0-10 Pain Score: 10-Worst pain ever Pain Type: Acute pain Pain Location: Back Pain Orientation: Upper;Mid;Lower Pain Descriptors / Indicators: Spasm;Aching Pain Frequency: Constant Pain Onset: On-going Patients Stated Pain Goal: 3 Pain Intervention(s): Medication (See eMAR) Home Living/Prior [place] expects to be discharged to:: Private residence Living Arrangements: Spouse/significant other Available Help at Discharge: Family,Available 24 hours/day Type of Home: House Home Access: Stairs to enter Technical brewer of Steps: 1 Home Layout: Two level,Able to live on main level with bedroom/bathroom Bathroom Shower/Tub: Walk-in shower (6" step) Bathroom Toilet: Handicapped height Bathroom Accessibility: Yes Additional Comments:  grab bars and built in seats as well as free standing seat in shower, has 3 in1, used cane intermittently  Lives With: Spouse IADL History Homemaking Responsibilities: Yes Meal Prep Responsibility: Secondary  Laundry Responsibility: Secondary Cleaning Responsibility: Secondary Homemaking Comments: pt enjoys doing housework but does not have to Current License: Yes Occupation: Retired Prior Function Level of Independence: Independent with basic ADLs  Able to [place]?: Yes Driving: No Vocation: Retired Comments: Occasional use of [place]. Patient notes "furniture walking" at home. Vision Baseline Vision/History: Wears glasses Wears Glasses: Reading only Patient Visual Report: No change from baseline Vision Assessment?: No apparent visual deficits Perception  Perception: Within Functional Limits Praxis Praxis: Intact Cognition Overall Cognitive Status: Impaired/Different from baseline Arousal/Alertness: Awake/alert Orientation Level: Person;Place;Situation Person: Oriented Place: Oriented Situation: Oriented Year: 2021 Month: December Day of Week: Incorrect (Tues or Weds) Memory: Appears intact Immediate Memory Recall: Sock;Blue;Bed Memory Recall Sock: Without Cue Memory Recall Blue: Without Cue Memory Recall Bed: Not able to recall (even given choice of 3, unable to recall) Attention: Selective;Alternating Selective Attention: Appears intact Awareness: Appears intact Problem Solving: Appears intact Safety/Judgment: Appears intact Sensation Sensation Light Touch: Appears Intact Proprioception: Impaired by gross assessment Additional Comments: pt appears unsteady in standing Coordination Gross Motor Movements are Fluid and Coordinated: No Fine Motor Movements are Fluid and Coordinated: Yes Coordination and Movement Description: grossly uncoordinated due to generalized weakness, pain, decreased standing balance/coordination, and decreased endurance Finger Nose Finger  Test: WFL bilaterally, decreased shoulder ROM on LUE Heel Shin Test: [facility] bilaterally Motor  Motor Motor: Abnormal postural alignment and control Motor - Skilled Clinical Observations: pt unsteady in standing  Trunk/Postural Assessment  Cervical Assessment Cervical Assessment: Exceptions to [facility] (mild forward head due to weight of Halo) Thoracic Assessment Thoracic Assessment: Exceptions to [facility] (rounded shoulders due to weight of Halo) Lumbar Assessment Lumbar Assessment: Within Functional Limits Postural Control Postural Control: Deficits on evaluation  Balance Balance Balance Assessed: Yes Static Sitting Balance Static Sitting - Balance Support: Feet supported;Bilateral upper extremity supported Static Sitting - Level of Assistance: 5: Stand by assistance (supervision) Dynamic Sitting Balance Dynamic Sitting - Balance Support: Feet supported;Bilateral upper extremity supported (supervision) Static Standing Balance Static Standing - Balance Support: No upper extremity supported Static Standing - Level of Assistance: 5: Stand by assistance (CGA) Dynamic Standing Balance Dynamic Standing - Balance Support: No upper extremity supported Dynamic Standing - Level of Assistance: 4: Min assist Extremity/Trunk Assessment RUE Assessment RUE Assessment: Within Functional Limits Passive Range of Motion (PROM) Comments: WFL,Ulnar deviations of fingers due to RA General Strength Comments: grossly 4/5 LUE Assessment LUE Assessment: Within Functional Limits Passive Range of Motion (PROM) Comments: WFL, pain limiting overhead reaching but still WFL,Ulnar deviations of fingers due to RA General Strength Comments: grossly 4+/5  Care Tool Care Tool Self Care Eating        Oral Care         Bathing   Body parts bathed by patient: Right arm;Left arm;Abdomen;Front perineal area;Right upper leg;Left upper leg;Face Body parts bathed by helper: Buttocks;Right lower leg;Left lower leg Body  parts n/a: Chest Assist Level: Moderate Assistance - Patient 50 - 74%    Upper Body Dressing(including orthotics)   What is the patient wearing?: Button up shirt   Assist Level: Maximal Assistance - Patient 25 - 49%    Lower Body Dressing (excluding footwear)   What is the patient wearing?: Underwear/pull up;Pants Assist for lower body dressing: Minimal Assistance - Patient > 75%    Putting on/Taking off footwear   What is the patient wearing?: Non-skid slipper socks Assist for footwear: Dependent - Patient 0%       Care Tool Toileting Toileting activity  Assist for toileting: Moderate Assistance - Patient 50 - 74%     Care Tool Bed Mobility Roll left and right activity   Roll left and right assist level: Contact Guard/Touching assist    Sit to lying activity   Sit to lying assist level: Contact Guard/Touching assist    Lying to sitting edge of bed activity   Lying to sitting edge of bed assist level: Moderate Assistance - Patient 50 - 74%     Care Tool Transfers Sit to stand transfer   Sit to stand assist level: Minimal Assistance - Patient > 75%    Chair/bed transfer   Chair/bed transfer assist level: Moderate Assistance - Patient 50 - 74%     Toilet transfer   Assist Level: Moderate Assistance - Patient 50 - 74%     Care Tool Cognition Expression of Ideas and Wants Expression of Ideas and Wants: Without difficulty (complex and basic) - expresses complex messages without difficulty and with speech that is clear and easy to understand   Understanding Verbal and Non-Verbal Content Understanding Verbal and Non-Verbal Content: Understands (complex and basic) - clear comprehension without cues or repetitions   Memory/Recall Ability *first 3 days only Memory/Recall Ability *first 3 days only: Current season;Location of own room;Staff names and faces;That he or she is in a hospital/hospital unit    Refer to Care Plan for [place] 1 OT  Short Term Goal 1 (Week 1): Pt will complete UB dressing with min assist OT Short Term Goal 2 (Week 1): Pt will complete toilet transfer with min assist OT Short Term Goal 3 (Week 1): Pt will complete bathing CGA at sit > stand level  Recommendations for other services: Therapeutic Recreation  Other leisure pursuits   Skilled Therapeutic Intervention OT eval completed with discussion of rehab process, OT purpose, POC, ELOS, and goals.  ADL assessment completed with focus on self-care retraining, sit > stand, and functional transfers.  Pt received upright in bed with RN present to administer morning meds.  Pt reports sore throat and not sleeping well overnight but agreeable to session.  Pt required mod assist to come to sitting at EOB.  Completed stand pivot transfer mod assist sit > stand and stand pivot transfer to arm chair.  Pt engaged in bathing from chair to allow for back support.  Pt completed UB bathing with setup for items and pt able to wash underarms but unable to wash chest or back due to Halo vest.  Discussed alternative clothing to allow for UB dressing, pt's husband had supplied some of his button up shirts.  Pt unable to don her button up shirt due to too tight around Halo vest, however able to wear husband's shirt.  Pt thread LUE, but then unable to bring shirt around back and thread RUE requiring assistance. Pt also unable to fasten buttons due to decreased ability to see buttons secondary to halo.  Pt reports need to toilet.  Completed sit > stand and had husband remove chair and place BSC in its place. Pt completed sit <> stand min assist to sit on toilet.  Pt voided bladder and completed hygiene while seated on BSC.  Pt able to thread BLE in to pullup underwear and pants with CGA for sitting balance and then min assist for sit > stand with pt able to pull pants over hips.  Pt completed stand pivot transfer back EOB with Min assist.  Pt returned to upright in bed  with min assist.  Pt  remained supported upright with all needs in reach and husband present.  ADL ADL Upper Body Bathing: Minimal assistance Where Assessed-Upper Body Bathing: Chair Lower Body Bathing: Minimal assistance Where Assessed-Lower Body Bathing: Chair Upper Body Dressing: Maximal assistance Where Assessed-Upper Body Dressing: Chair Lower Body Dressing: Minimal assistance Where Assessed-Lower Body Dressing: Chair Toileting: Minimal assistance Where Assessed-Toileting: Bedside Commode Toilet Transfer: Moderate assistance Toilet Transfer Method: Stand pivot Toilet Transfer Equipment: Bedside commode Mobility  Bed Mobility Bed Mobility: Rolling Right;Supine to Sit Rolling Right: Contact Guard/Touching assist Supine to Sit: Contact Guard/Touching assist Transfers Sit to Stand: Minimal Assistance - Patient > 75% Stand to Sit: Minimal Assistance - Patient > 75%   Discharge Criteria: Patient will be discharged from OT if patient refuses treatment 3 consecutive times without medical reason, if treatment goals not met, if there is a change in medical status, if patient makes no progress towards goals or if patient is discharged from hospital.  The above assessment, treatment plan, treatment alternatives and goals were discussed and mutually agreed upon: by patient and by family  Ellwood Dense [facility] 02/22/2020, 12:11 PM

## 2020-02-22 NOTE — Progress Notes (Signed)
Patient complaining of 10/10 pain. No PRN meds available to give. Was told by night shift nurse that patient was up crying in pain last night. Patient stated she did not sleep hardly any last night due to pain.

## 2020-02-23 ENCOUNTER — Inpatient Hospital Stay (HOSPITAL_COMMUNITY): Payer: Medicare PPO

## 2020-02-23 ENCOUNTER — Inpatient Hospital Stay (HOSPITAL_COMMUNITY): Payer: Medicare PPO | Admitting: Occupational Therapy

## 2020-02-23 MED ORDER — SORBITOL 70 % SOLN
30.0000 mL | Freq: Every day | Status: DC | PRN
  Administered 2020-02-23: 30 mL via ORAL
  Filled 2020-02-23: qty 30

## 2020-02-23 NOTE — Progress Notes (Signed)
Physical Therapy Session Note  Patient Details  Name: Michele Bryan MRN: [id number] Date of Birth: 1940-09-30  Today's Date: 02/23/2020 PT Individual Time: 0900-0945 PT Individual Time Calculation (min): 45 min   Short Term Goals: Week 1:  PT Short Term Goal 1 (Week 1): pt will perform bed mobility with supervision PT Short Term Goal 2 (Week 1): pt will transfer bed<>chair with LRAD CGA PT Short Term Goal 3 (Week 1): Pt will ambulate 72ft with LRAD CGA  Skilled Therapeutic Interventions/Progress Updates:   Patient received sitting up in wc, agreeable to PT. She reports "mild pain" in her neck, but states that it's "manageable." PT propelling patient in wc to therapy gym for time management and energy conservation. She requested to review xrays to see "why" she received a Halo. PT reviewing anatomy of spine and location of sx of patient. Patient without further questions at this time. She was able to ambulate 6ft with ModA + HHA. Patient appearing unable to ambulate directly straight forward, despite visual cues for foot placement. She will abduct L LE and adduct R LE on forward angle resulting in generally L forward gait path. Slight ataxia noted in L LE. After seated rest break, patient ambulating 6ft back to wc demonstrating same gait pattern, with ModA + HHA. Patient reporting ulnar nerve- distribution symptoms related to trigger point in subscapular region. Brief prolonged pressure to trigger point with mild relief of symptoms reported by patient. Patient propelling wc 174ft using B LE and R UE with CGA for steering. Patient with difficulty assessing for clear path free of obstacles due to limited cervical/capital ROM related to Halo. Patient remaining up in wc at end of session, seatbelt alarm on, call light within reach.    Therapy Documentation Precautions:  Precautions Precautions: Cervical,Fall Required Braces or Orthoses: Other Brace Cervical Brace: Other (comment) Other Brace:  Halo Restrictions Weight Bearing Restrictions: No    Therapy/Group: Individual Therapy  Elizebeth Koller, PT, DPT, CBIS  02/23/2020, 7:36 AM

## 2020-02-23 NOTE — Progress Notes (Signed)
Occupational Therapy Session Note  Patient Details  Name: Michele Bryan MRN: [id number] Date of Birth: 1940-03-04  Today's Date: 02/23/2020 OT Individual Time: 1855-0158 OT Individual Time Calculation (min): 43 min   Short Term Goals: Week 1:  OT Short Term Goal 1 (Week 1): Pt will complete UB dressing with min assist OT Short Term Goal 2 (Week 1): Pt will complete toilet transfer with min assist OT Short Term Goal 3 (Week 1): Pt will complete bathing CGA at sit > stand level  Skilled Therapeutic Interventions/Progress Updates:    Pt greeted semi-reclined in bed with daughter-in-law present and agreeable to OT treatment session. Pt completed bed mobility with HOB elevated and supervision. Functional ambulation in hallway 150 ft to dayroom w/ RW and verbal cues for safe postioning of RW. Worked on R hand grip and pinch strength as well as coordination using soft yellow theraputty. Standing there-ex of 3 sets of 10 standing marches. Pt ambulated 150 ft back to room w/ RW and min/CGA. Pt left seated in wc with alarm belt on, daughter-in-law present, and needs met.   Therapy Documentation Precautions:  Precautions Precautions: Cervical,Fall Required Braces or Orthoses: Other Brace Cervical Brace: Other (comment) Other Brace: Halo Restrictions Weight Bearing Restrictions: No Pain:   pt reports mild neck pain, no number given. Rest and repositioned for comfort   Therapy/Group: Individual Therapy  Valma Cava 02/23/2020, 2:48 PM

## 2020-02-23 NOTE — Progress Notes (Signed)
Physical Therapy Session Note  Patient Details  Name: Michele Bryan MRN: [id number] Date of Birth: 09-14-1940  Today's Date: 02/23/2020 PT Individual Time: [phone] and 1450-1529 PT Individual Time Calculation (min): 42 min and 39 min  Short Term Goals: Week 1:  PT Short Term Goal 1 (Week 1): pt will perform bed mobility with supervision PT Short Term Goal 2 (Week 1): pt will transfer bed<>chair with LRAD CGA PT Short Term Goal 3 (Week 1): Pt will ambulate 31ft with LRAD CGA  Skilled Therapeutic Interventions/Progress Updates:   Treatment Session 1: [id number] 42 min Received pt sitting upright in bed, pt agreeable to therapy, and denied any pain during session, stating she received her pain medication earlier this morning. Session with emphasis on dressing, functional mobility/transfers, generalized strengthening, dynamic standing balance/coordination, and improved activity tolerance. Donned pants in supine with min/mod A and pt transferred supine<>sitting EOB with HOB elevated and use of bedrails with close supervision. Doffed dirty shirt and donned clean button up shirt with min A due to pt's inability to flex neck to locate buttons. Pt able to maintain dynamic sitting balance with CGA/close supervision as pt with mild posterior lean. Pt transferred sit<>stand without AD and min A. Pt reported lightheadedness and demonstrated posterior lean in standing. Returned to sitting and lightheadedness resolved in <2 minutes. Pt then transferred stand<>pivot with RW and min A with cues for turning technique and hand placement on RW. Pt transported to dayroom in [facility] total A for time management purposes and transferred WC<>Nustep with RW and CGA. Pt performed BUE/LE strengthening on Nustep at workload 2 for 10 minutes for a total of 514 steps for improved cardiovascular endurance. Pt transported back to room in [facility] total A. Concluded session with pt sitting in WC, needs within reach, and seatbelt alarm on. MD  present for morning rounds.   Treatment Session 2: 1450-1529 39 min Received pt sitting in WC, pt agreeable to therapy, and reported pain in posterior neck radiating down L arm. RN notified and present to administer pain medication during session. Repositioning, rest breaks, and distraction done to reduce pain levels. Session with emphasis on functional mobility/transfers, generalized strengthening, dynamic standing balance/coordination, ambulation, and improved activity tolerance. Pt transported to dayroom in [facility] total A and transferred WC<>mat stand<>pivot with RW and CGA. Pt sat on mat and engaged in dynamic sitting balance petting therapy dog Bella ~5 minutes for improved mood and spirits. Pt attempted 5x sit<>stand but stopped after 1 rep due to increased pain along LUE. Pt instead performed alternating toe taps to 3in step with RW and CGA 2x20 reps. Pt ambulated 144ft with RW and CGA back to room. Pt transferred sit<>supine with supervision. Concluded session with pt sitting upright in bed, needs within reach, and bed alarm on.  Therapy Documentation Precautions:  Precautions Precautions: Cervical,Fall Required Braces or Orthoses: Other Brace Cervical Brace: Other (comment) Other Brace: Halo Restrictions Weight Bearing Restrictions: No  Therapy/Group: Individual Therapy Martin Majestic PT, DPT   02/23/2020, 7:45 AM

## 2020-02-23 NOTE — Progress Notes (Signed)
[place] PHYSICAL MEDICINE & REHABILITATION PROGRESS NOTE   Subjective/Complaints: Slept much better last night Worked very hard with therapy.  +constipation: added sorbitol daily prn 72mL Discussed lab results stable  ROS: pain is well controlled with medications, +constipation  Objective:   DG Abd 1 View  Result Date: 02/21/2020 CLINICAL DATA:  Constipation EXAM: ABDOMEN - 1 VIEW COMPARISON:  None. FINDINGS: The bowel gas pattern is nonobstructive. There is an above average amount of stool throughout the colon. Degenerative changes are noted throughout the lumbar spine. There is no radiopaque kidney stone. IMPRESSION: 1. Nonobstructive bowel gas pattern. 2. Above average amount of stool throughout the colon. Electronically Signed   By: Katherine Mantle M.D.   On: 02/21/2020 19:30   Recent Labs    02/22/20 0435  WBC 8.6  HGB 11.6*  HCT 34.4*  PLT 374   Recent Labs    02/22/20 0435  NA 131*  K 4.2  CL 96*  CO2 23  GLUCOSE 102*  BUN 10  CREATININE 0.57  CALCIUM 9.0    Intake/Output Summary (Last 24 hours) at 02/23/2020 1041 Last data filed at 02/23/2020 0900 Gross per 24 hour  Intake 360 ml  Output 225 ml  Net 135 ml        Physical Exam: Vital Signs Blood pressure (!) 116/47, pulse 82, temperature 98.6 F (37 C), resp. rate 16, height 5\' 3"  (1.6 m), weight 51.7 kg, SpO2 95 %.  Gen: no distress, normal appearing HEENT: Posterior neck incision- has a lot of dried, but not new blood- sutures in place- no erythema. HALO attached with vest Cardio: Reg rate Chest: normal effort, normal rate of breathing Abd: soft, non-distended Ext: no edema Skin: intact Musculoskeletal:     Comments: RUE deltoid, biceps, triceps, WE, grip and finger abd 4/5- weaker than L LUE- stronger,( but hx of L wrist fx and L bad shoulder) at 5-/5 in same muscles tested- but painful LEs- 4+/5 HF, KE, DF and PF B/L  Skin:    General: Skin is warm and dry.     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     Comments: Right facial weakness. Verbose. Able to follow commands without difficulty.  Intact to light touch x4 extremities B/L Higher level cognition and processing slowed  Psychiatric:        Mood and Affect: Mood normal.        Behavior: Behavior normal.   Assessment/Plan: 1. Functional deficits which require 3+ hours per day of interdisciplinary therapy in a comprehensive inpatient rehab setting.  Physiatrist is providing close team supervision and 24 hour management of active medical problems listed below.  Physiatrist and rehab team continue to assess barriers to discharge/monitor patient progress toward functional and medical goals  Care Tool:  Bathing    Body parts bathed by patient: Right arm,Left arm,Abdomen,Front perineal area,Right upper leg,Left upper leg,Face   Body parts bathed by helper: Buttocks,Right lower leg,Left lower leg Body parts n/a: Chest   Bathing assist Assist Level: Moderate Assistance - Patient 50 - 74%     Upper Body Dressing/Undressing Upper body dressing   What is the patient wearing?: Button up shirt    Upper body assist  Assist Level: Maximal Assistance - Patient 25 - 49%    Lower Body Dressing/Undressing Lower body dressing      What is the patient wearing?: Pants     Lower body assist Assist for lower body dressing: Moderate Assistance - Patient 50 - 74%     Toileting Toileting    Toileting assist Assist for toileting: Moderate Assistance - Patient 50 - 74%     Transfers Chair/bed transfer  Transfers assist     Chair/bed transfer assist level: Minimal Assistance - Patient > 75%     Locomotion Ambulation   Ambulation assist       Assist level: Moderate Assistance - Patient 50 - 74% Assistive device: Other (comment) (HHA) Max distance: 72ft   Walk 10 feet activity   Assist     Assist level: Moderate Assistance - Patient - 50 - 74% Assistive device: Other (comment) (HHA)   Walk 50 feet activity   Assist    Assist level: Moderate Assistance - Patient - 50 - 74% Assistive device: Other (comment) (HHA)    Walk 150 feet activity   Assist Walk 150 feet activity did not occur: Safety/medical concerns (fatigue, weakness, decreased balance and endurance)         Walk 10 feet on uneven surface  activity   Assist Walk 10 feet on uneven surfaces activity did not occur: Safety/medical concerns (fatigue, weakness, decreased balance and endurance)         Wheelchair     Assist Will patient use wheelchair at discharge?: Yes Type of Wheelchair: Manual    Wheelchair assist level: Contact Guard/Touching assist Max wheelchair distance: 12ft    Wheelchair 50 feet with 2 turns activity    Assist        Assist Level: Moderate Assistance - Patient 50 - 74%   Wheelchair 150 feet activity     Assist      Assist Level: Maximal Assistance - Patient 25 - 49%   Blood pressure (!) 116/47, pulse 82, temperature 98.6 F (37 C), resp. rate 16, height  (1.6 m), weight 51.7 kg, SpO2 95 %.  Medical Problem List and Plan: 1.  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  Antithrombotics: -DVT/anticoagulation:  Pharmaceutical: Heparin             -antiplatelet therapy: N/A 3. Pain Management: On tylenol every 4 hours-->decrease to tid to avoid liver toxicity. Continue Oxycodone 10-15 mg every 4 hours prn. Pt  reports pain is well controlled with medication: continue current regimen.  4. Mood: LCSW             -antipsychotic agents: N/a 5. Neuropsych: This patient is not fully capable of making decisions on her own behalf. 6. Skin/Wound Care: Routine pressure relief measures.  7. Fluids/Electrolytes/Nutrition:  Monitor I/O. Check lytes in am. Encourage fluid intake.  8.  Multiple sclerosis with peripheral neuropathy: Question cognitive impairment at baseline 9.  HTN: Monitor blood pressures twice daily.  BP quite labile, continue Cozaar twice daily. 10.  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 11. Osteoporosis:  Note prior authorization was submitted to [facility] for Forteo ( patient has declined this in the past). 11. Anemia/ Leucocytosis:  12/22 normalized.  12. H/o colitis and chronic intermittent LLQ pain/Constipation: No BM documented since admission.KUB shows constipation. Sorbitol 15 ml without results, increased to 30 and made daily prn  LOS: 2 days A FACE TO FACE EVALUATION WAS PERFORMED  Drema Pry Oriel Rumbold 02/23/2020, 10:41 AM

## 2020-02-23 NOTE — Progress Notes (Signed)
Patient ID: Michele Bryan, female   DOB: [birth year], 79 y.o.   MRN: [id number] Met with the patient and her daughter to review the role of the nurse CM and collaboration with the SW to facilitate preparation for discharge. Reviewed halo brace home care; allen wrench taped to brace for emergency. Continue to follow along to discharge for educational needs. No questions or concerns noted at present. Margarito Liner

## 2020-02-24 ENCOUNTER — Inpatient Hospital Stay (HOSPITAL_COMMUNITY): Payer: Medicare PPO | Admitting: Occupational Therapy

## 2020-02-24 ENCOUNTER — Inpatient Hospital Stay (HOSPITAL_COMMUNITY): Payer: Medicare PPO

## 2020-02-24 MED ORDER — MAGNESIUM CITRATE PO SOLN
1.0000 | Freq: Once | ORAL | Status: AC
  Administered 2020-02-24: 1 via ORAL
  Filled 2020-02-24: qty 296

## 2020-02-24 MED ORDER — AMITRIPTYLINE HCL 10 MG PO TABS
10.0000 mg | ORAL_TABLET | Freq: Every day | ORAL | Status: DC
  Administered 2020-02-24 – 2020-02-29 (×6): 10 mg via ORAL
  Filled 2020-02-24 (×6): qty 1

## 2020-02-24 NOTE — IPOC Note (Signed)
Overall Plan of Care [facility]) Patient Details Name: Michele Bryan MRN: [id number] DOB: 1940/08/05  Admitting Diagnosis: Cervical myelopathy [facility])  Hospital Problems: Principal Problem:   Cervical myelopathy (HCC)     Functional Problem List: Nursing Medication Management,Pain,Safety  PT Balance,Endurance,Motor,Pain,Skin Integrity  OT Balance,Endurance,Pain,Safety  SLP    TR         Basic ADL's: OT Grooming,Bathing,Dressing,Toileting     Advanced  ADL's: OT       Transfers: PT Bed Mobility,Bed to [employer]  OT Toilet     Locomotion: PT Ambulation,Wheelchair Mobility,Stairs     Additional Impairments: OT None  SLP        TR      Anticipated Outcomes Item Anticipated Outcome  Self Feeding    Swallowing      Basic self-care  Supervision  Toileting  Supervision   Bathroom Transfers Supervision  Bowel/Bladder  patient will remain continent, retain a normal bowel elimination pattern  Transfers  supervision with LRAD  Locomotion  supervision with LRAD  Communication     Cognition     Pain  pain will be managed to an acceptable level  Safety/Judgment  patient will have no falls with ijury on CIR   Therapy Plan: PT Intensity: Minimum of 1-2 x/day ,45 to 90 minutes PT Frequency: 5 out of 7 days PT Duration Estimated Length of Stay: 10-14 days OT Intensity: Minimum of 1-2 x/day, 45 to 90 minutes OT Frequency: 5 out of 7 days OT Duration/Estimated Length of Stay: 10-14 days     Due to the current state of emergency, patients may not be receiving their 3-hours of Medicare-mandated therapy.   Team Interventions: Nursing Interventions Patient/Family Education,Bowel Management,Disease Management/Prevention,Pain Management,Medication Management,Discharge Planning,Psychosocial Support  PT interventions Ambulation/gait training,Discharge planning,Functional mobility training,Psychosocial support,Therapeutic Activities,Balance/vestibular  training,Disease management/prevention,Neuromuscular re-education,Skin care/wound Environmental consultant propulsion/positioning,Cognitive remediation/compensation,DME/adaptive equipment instruction,Pain management,Splinting/orthotics,UE/LE Strength taining/ROM,Community reintegration,Functional electrical stimulation,Patient/family education,Stair training,UE/LE Coordination activities  OT Interventions Balance/vestibular training,Cognitive remediation/compensation,Community reintegration,Discharge planning,Disease mangement/prevention,DME/adaptive equipment instruction,Functional mobility training,Pain management,Patient/family education,Psychosocial support,Self Care/advanced ADL retraining,Splinting/orthotics,Therapeutic Activities,Therapeutic Exercise,UE/LE Strength taining/ROM  SLP Interventions    TR Interventions    SW/CM Interventions Discharge Planning,Psychosocial Support,Patient/Family Education   Barriers to Discharge MD  Medical stability  Nursing      PT Inaccessible home environment,Home environment access/layout,Wound Care,Other (comments) Halo precautions, 1 STE with no rails, mild congitive impairments  OT      SLP      SW       Team Discharge Planning: Destination: PT-Home ,OT- Home , SLP-  Projected Follow-up: PT-Home health PT, OT-  Home health OT,24 hour supervision/assistance, SLP-  Projected Equipment Needs: PT-To be determined, OT- None recommended by OT, SLP-  Equipment Details: PT-pt has rollator and SPC, OT-  Patient/family involved in discharge planning: PT- Patient,Family member/caregiver,  OT-Patient,Family member/caregiver, SLP-   MD ELOS: 14-16 days modI to S Medical Rehab Prognosis:  Excellent Assessment: Michele Bryan is a 79 year old woman admitted with unstable C1/2 fx with deformity s/p HALO and C1-C5 posterior fusion and resulting mild weaknesssecondary to previous falls with impaired ADLs/mobility. Pain is well controlled with  medication. Cozaar has been continued to HTN. Pain is being managed with oxycodone and Tylenol. Amitirpytline has been started for insomnia. Magnesium citrate has been given for symptomatic constipation.   See Team Conference Notes for weekly updates to the plan of care

## 2020-02-24 NOTE — Progress Notes (Signed)
[place] PHYSICAL MEDICINE & REHABILITATION PROGRESS NOTE   Subjective/Complaints: Still constipated, magnesium citrate ordered for this AM Pain is tolerable Able to participate in therapy.  Slept very poorly last night  ROS: pain is well controlled with medications, +constipation, +insomnia  Objective:   No results found. Recent Labs    02/22/20 0435  WBC 8.6  HGB 11.6*  HCT 34.4*  PLT 374   Recent Labs    02/22/20 0435  NA 131*  K 4.2  CL 96*  CO2 23  GLUCOSE 102*  BUN 10  CREATININE 0.57  CALCIUM 9.0    Intake/Output Summary (Last 24 hours) at 02/24/2020 0865 Last data filed at 02/23/2020 1800 Gross per 24 hour  Intake 450 ml  Output --  Net 450 ml        Physical Exam: Vital Signs Blood pressure 135/78, pulse 85, temperature 97.9 F (36.6 C), temperature source Oral, resp. rate 16, height 5\' 3"  (1.6 m), weight 51.7 kg, SpO2 100 %.  Gen: no distress, normal appearing HEENT: Posterior neck incision- has a lot of dried, but not new blood- sutures in place- no erythema. HALO attached with vest Cardio: Reg rate Chest: normal effort, normal rate of breathing Abd: soft, non-distended Ext: no edema Skin: intact Musculoskeletal:     Comments: RUE deltoid, biceps, triceps, WE, grip and finger abd 4/5- weaker than L LUE- stronger,( but hx of L wrist fx and L bad shoulder) at 5-/5 in same muscles tested- but painful LEs- 4+/5 HF, KE, DF and PF B/L  Skin:    General: Skin is warm and dry.     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     Comments: Right facial weakness. Verbose. Able to follow commands without difficulty.  Intact to light touch x4 extremities B/L Higher level cognition and  processing slowed  Psychiatric:        Mood and Affect: Mood normal.        Behavior: Behavior normal.   Assessment/Plan: 1. Functional deficits which require 3+ hours per day of interdisciplinary therapy in a comprehensive inpatient rehab setting.  Physiatrist is providing close team supervision and 24 hour management of active medical problems listed below.  Physiatrist and rehab team continue to assess barriers to discharge/monitor patient progress toward functional and medical goals  Care Tool:  Bathing    Body parts bathed by patient: Right arm,Left arm,Abdomen,Front perineal area,Right upper leg,Left upper leg,Face   Body parts bathed by helper: Buttocks,Right lower leg,Left lower leg Body parts n/a: Chest   Bathing assist Assist Level: Moderate Assistance - Patient 50 - 74%     Upper Body Dressing/Undressing Upper body dressing   What is the patient wearing?: Button up shirt    Upper body assist Assist Level: Maximal Assistance - Patient 25 - 49%    Lower Body Dressing/Undressing Lower body dressing      What is the patient wearing?: Pants     Lower body assist Assist for lower body dressing: Moderate Assistance - Patient 50 - 74%     Toileting Toileting    Toileting assist Assist for toileting: Moderate Assistance - Patient 50 - 74%     Transfers Chair/bed transfer  Transfers assist     Chair/bed transfer assist level: Minimal Assistance - Patient > 75%     Locomotion Ambulation   Ambulation assist      Assist level: Moderate Assistance - Patient 50 - 74% Assistive device: Hand held assist Max distance: 25   Walk 10 feet activity   Assist     Assist level: Moderate Assistance - Patient - 50 - 74% Assistive device: Hand held assist   Walk 50 feet activity   Assist    Assist level: Contact Guard/Touching assist Assistive device: Walker-rolling    Walk 150 feet activity   Assist Walk 150 feet activity did not occur:  Safety/medical concerns (fatigue, weakness, decreased balance and endurance)  Assist level: Contact Guard/Touching assist Assistive device: Walker-rolling    Walk 10 feet on uneven surface  activity   Assist Walk 10 feet on uneven surfaces activity did not occur: Safety/medical concerns (fatigue, weakness, decreased balance and endurance)         Wheelchair     Assist Will patient use wheelchair at discharge?: Yes Type of Wheelchair: Manual    Wheelchair assist level: Contact Guard/Touching assist Max wheelchair distance: 150    Wheelchair 50 feet with 2 turns activity    Assist        Assist Level: Contact Guard/Touching assist   Wheelchair 150 feet activity     Assist      Assist Level: Contact Guard/Touching assist   Blood pressure 135/78, pulse 85, temperature 97.9 F (36.6 C), temperature source Oral, resp. rate 16, height 5\' 3"  (1.6 m), weight 51.7 kg, SpO2 100 %.  Medical Problem List and Plan: 1.  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  Antithrombotics: -DVT/anticoagulation:  Pharmaceutical: Heparin             -antiplatelet therapy: N/A 3. Pain Management: On tylenol every 4 hours-->decrease to tid to avoid liver toxicity. Continue Oxycodone 10-15 mg every 4 hours prn. Pt reports pain is well controlled with medication: continue current regimen.  4. Mood: LCSW             -antipsychotic agents: N/a 5. Neuropsych: This patient is not fully capable of making decisions on her own behalf. 6. Skin/Wound Care: Routine pressure relief measures.  7. Fluids/Electrolytes/Nutrition:  Monitor I/O. Check lytes in am. Encourage fluid intake.  8.  Multiple sclerosis with  peripheral neuropathy: Question cognitive impairment at baseline 9.  HTN: Monitor blood pressures twice daily.  BP quite labile, continue Cozaar twice daily. 10.  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 11. Osteoporosis: Note prior authorization was submitted to [facility] for Forteo ( patient has declined this in the past). 11. Anemia/ Leucocytosis:  12/22 normalized.  12. H/o colitis and chronic intermittent LLQ pain/Constipation: No BM documented since admission.KUB shows constipation. Sorbitol 15 ml without results, increased to 30 and made daily prn. Add magnesium citrate this morning.  13. Insomnia: add amitripytline 10mg  HS  LOS: 3 days A FACE TO FACE EVALUATION WAS PERFORMED  Almalik Weissberg P Lorie Cleckley 02/24/2020, 7:12 AM

## 2020-02-24 NOTE — Progress Notes (Signed)
Physical Therapy Session Note  Patient Details  Name: Michele Bryan MRN: [id number] Date of Birth: 03/03/1941  Today's Date: 02/24/2020 PT Individual Time: [phone] and 1332-1408  PT Individual Time Calculation (min): 56 min and 36 min PT Missed time: 24 minutes due to nausea  Short Term Goals: Week 1:  PT Short Term Goal 1 (Week 1): pt will perform bed mobility with supervision PT Short Term Goal 2 (Week 1): pt will transfer bed<>chair with LRAD CGA PT Short Term Goal 3 (Week 1): Pt will ambulate 30ft with LRAD CGA  Skilled Therapeutic Interventions/Progress Updates:   Treatment Session 1: 0800-0856 56 min Received pt sitting on bedside commode with son present, pt's son stated she has been sitting on commode for 15 minutes but has been unsuccessful. Pt agreeable to therapy, and reported pain 7/10 underneath shoulder blades (premedicated). Repositioning and rest breaks done to reduce pain levels. Pt reported not sleeping well last night and felt tired this morning. Session with emphasis on toileting, functional mobility/transfers, dressing, dynamic standing balance/coordination, ambulation, and improved activity tolerance. Pt transferred sit<>stand with RW and CGA and required min A to pull pullup over hips. Stand<>pivot bedside commode<>bed with RW and CGA with cues for safety when turning and to keep feet inside RW. Doffed dirty pants and donned clean pants sitting EOB with min A. Pt transferred sit<>stand with RW and CGA and required CGA to pull pants over hips. Doffed dirty shirt and donned clean button up with min A and increased time. Stand<>pivot bed<>WC with RW and CGA with increased time due to initial c/o dizziness; resolved in <1 minute while standing. Pt sat in [facility] and brushed teeth with supervision at sink. Pt ambulated 121ft with RW and CGA to dayroom. Pt required cues for upright posture/gaze and to increase step length. Pt politely declined practicing steps this morning due to  fatigue and performed BLE strengthening on Kinetron at 20 cm/sec for 1 minute x 3 trials without back support with emphasis on glute/quad strengthening and trunk control. Pt transported back to room in [facility] total A. Concluded session with pt sitting in WC, needs within reach, and seatbelt alarm on.   Treatment Session 2: 1332-1408 36 min Received pt sitting on bedside commode attempting to have BM with son present in room. Pt expressed urgency but remains unsuccessful. Pt reported pain 7/10 and c/o nausea with 1 episode of emesis 1 hour prior to therapy session per pt's son. RN notified and present to administer anti-nausea medication. Pt transferred sit<>stand with RW and CGA and required min A to pull pants/brief over hips. Stand<>pivot bedside commode<>bed with RW and CGA. Pt requested to lie down and transferred sit<>semi-reclined with supervision. RN present to administer anti-nausea pill and pt dropped first pill on floor and lost second pill somewhere in bed attempting to put it in her mouth. Transferred semi-reclined<>sit<>stand with RW and CGA and RN able to find pill and give to pt. Stand<>sit<>semi-reclined with CGA. Pt politely declined OOB mobility due to nausea, requesting to rest but was agreeable to therapist providing HEP. Therapist provided pt HEP consisting of the following exercises and educated pt on frequency/technique/duration: -Supine Heel Slide - 1 x daily - 7 x weekly - 3 sets - 10 reps -Supine Hip Abduction - 1 x daily - 7 x weekly - 3 sets - 10 reps -Small Range Straight Leg Raise - 1 x daily - 7 x weekly - 2 sets - 10 reps -Standing March with Counter Support - 1 x  daily - 7 x weekly - 2 sets - 10 reps -Standing Knee Flexion AROM with Chair Support - 1 x daily - 7 x weekly - 2 sets - 10 reps -Standing Hip Abduction with Counter Support - 1 x daily - 7 x weekly - 2 sets - 10 reps Pt performed 1 rep of each supine exercise with supervision to demonstrate understanding of technique.  Concluded session with pt sitting in bed, needs within reach, and bed alarm on. Son present at bedside. 24 minutes missed of skilled physical therapy due to nausea.    Therapy Documentation Precautions:  Precautions Precautions: Cervical,Fall Required Braces or Orthoses: Other Brace Cervical Brace: Other (comment) Other Brace: Halo Restrictions Weight Bearing Restrictions: No   Therapy/Group: Individual Therapy Martin Majestic PT, DPT   02/24/2020, 7:17 AM

## 2020-02-24 NOTE — Progress Notes (Signed)
Occupational Therapy Session Note  Patient Details  Name: Michele Bryan MRN: [id number] Date of Birth: 01/23/1941  Today's Date: 02/24/2020 OT Individual Time: 7510-2585 OT Individual Time Calculation (min): 70 min    Short Term Goals: Week 1:  OT Short Term Goal 1 (Week 1): Pt will complete UB dressing with min assist OT Short Term Goal 2 (Week 1): Pt will complete toilet transfer with min assist OT Short Term Goal 3 (Week 1): Pt will complete bathing CGA at sit > stand level  Skilled Therapeutic Interventions/Progress Updates:    Treatment session with focus on sit <> stand, standing tolerance, and functional use of BUE during table top tasks.  Pt received upright in w/c already dressed and reports ready for therapy session.  Engaged in holiday craft at sit > stand level with focus on increased anterior weight shift while incorporating reaching for items.  Pt required min assist initially for sit > stand progressing to CGA throughout session.  Pt participated in table top task while reaching outside BOS and across midline.  Pt reports decreased coordination secondary to RA and L shoulder surgery.  Pt tolerated standing 3-4 mins at a time with CGA for standing balance due to posture.  Pt reports lightheadedness initially upon standing, which subsides with time.  Pt returned to room and remained upright in w/c with seat belt alarm on and all needs in reach.  Son present in room.  Therapy Documentation Precautions:  Precautions Precautions: Cervical,Fall Required Braces or Orthoses: Other Brace Cervical Brace: Other (comment) Other Brace: Halo Restrictions Weight Bearing Restrictions: No Pain: Pain Assessment Pain Scale: 0-10 Pain Score: 8  Pain Type: Acute pain Pain Location: Back Pain Intervention(s): Medication (See eMAR)   Therapy/Group: Individual Therapy  Rosalio Loud 02/24/2020, 12:27 PM

## 2020-02-25 MED ORDER — DOCUSATE SODIUM 100 MG PO CAPS
100.0000 mg | ORAL_CAPSULE | Freq: Every day | ORAL | Status: DC
  Administered 2020-02-25 – 2020-03-01 (×6): 100 mg via ORAL
  Filled 2020-02-25 (×6): qty 1

## 2020-02-25 NOTE — Progress Notes (Signed)
[place] PHYSICAL MEDICINE & REHABILITATION PROGRESS NOTE   Subjective/Complaints:  Patient feels okay today.  No pain complaints.  No problems with her breathing  ROS: pain is well controlled with medications negative nausea/vomiting bladder doing okay  Objective:   No results found. No results for input(s): WBC, HGB, HCT, PLT in the last 72 hours. No results for input(s): NA, K, CL, CO2, GLUCOSE, BUN, CREATININE, CALCIUM in the last 72 hours. No intake or output data in the 24 hours ending 02/25/20 1219      Physical Exam: Vital Signs Blood pressure 107/71, pulse 80, temperature 97.6 F (36.4 C), resp. rate 18, height 5\' 3"  (1.6 m), weight 51.7 kg, SpO2 99 %.   General: No acute distress Mood and affect are appropriate Heart: Regular rate and rhythm no rubs murmurs or extra sounds Lungs: Clear to auscultation, breathing unlabored, no rales or wheezes Abdomen: Positive bowel sounds, soft nontender to palpation, nondistended Extremities: No clubbing, cyanosis, or edema Skin: No evidence of breakdown, no evidence of rash   Musculoskeletal:     Comments: RUE deltoid, biceps, triceps, WE, grip and finger abd 4/5- weaker than L LUE- stronger,( but hx of L wrist fx and L bad shoulder) at 5-/5 in same muscles tested- but painful LEs- 4+/5 HF, KE, DF and PF B/L  Skin:    General: Skin is warm and dry.     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     Comments: Right facial weakness. Verbose. Able to follow commands without difficulty.  Intact to light touch x4 extremities B/L Higher level cognition and processing slowed  Psychiatric:        Mood and Affect: Mood normal.        Behavior: Behavior normal.    Assessment/Plan: 1. Functional deficits which require 3+ hours per day of interdisciplinary therapy in a comprehensive inpatient rehab setting.  Physiatrist is providing close team supervision and 24 hour management of active medical problems listed below.  Physiatrist and rehab team continue to assess barriers to discharge/monitor patient progress toward functional and medical goals  Care Tool:  Bathing    Body parts bathed by patient: Right arm,Left arm,Abdomen,Front perineal area,Right upper leg,Left upper leg,Face   Body parts bathed by helper: Buttocks,Right lower leg,Left lower leg Body parts n/a: Chest   Bathing assist Assist Level: Moderate Assistance - Patient 50 - 74%     Upper Body Dressing/Undressing Upper body dressing   What is the patient wearing?: Button up shirt    Upper body assist Assist Level: Maximal Assistance - Patient 25 - 49%    Lower Body Dressing/Undressing Lower body dressing      What is the patient wearing?: Pants     Lower body assist Assist for lower body dressing: Moderate Assistance - Patient 50 - 74%     Toileting Toileting    Toileting assist Assist for toileting: Moderate Assistance - Patient 50 - 74%     Transfers Chair/bed transfer  Transfers assist     Chair/bed transfer assist level: Contact Guard/Touching assist     Locomotion Ambulation   Ambulation assist      Assist level: Contact Guard/Touching assist Assistive device: Walker-rolling Max distance: 166ft  Walk 10 feet activity   Assist     Assist level: Contact Guard/Touching assist Assistive device: Walker-rolling   Walk 50 feet activity   Assist    Assist level: Contact Guard/Touching assist Assistive device: Walker-rolling    Walk 150 feet activity   Assist Walk 150 feet activity did not occur: Safety/medical concerns (fatigue, weakness, decreased balance and endurance)  Assist level: Contact Guard/Touching assist Assistive  device: Walker-rolling    Walk 10 feet on uneven surface  activity   Assist Walk 10 feet on uneven surfaces activity did not occur: Safety/medical concerns (fatigue, weakness, decreased balance and endurance)         Wheelchair     Assist Will patient use wheelchair at discharge?: Yes Type of Wheelchair: Manual    Wheelchair assist level: Contact Guard/Touching assist Max wheelchair distance: 150    Wheelchair 50 feet with 2 turns activity    Assist        Assist Level: Contact Guard/Touching assist   Wheelchair 150 feet activity     Assist      Assist Level: Contact Guard/Touching assist   Blood pressure 107/71, pulse 80, temperature 97.6 F (36.4 C), resp. rate 18, height 5\' 3"  (1.6 m), weight 51.7 kg, SpO2 99 %.  Medical Problem List and Plan: 1.  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  Antithrombotics: -DVT/anticoagulation:  Pharmaceutical: Heparin             -antiplatelet therapy: N/A 3. Pain Management: On tylenol every 4 hours-->decrease to tid to avoid liver toxicity. Continue Oxycodone 10-15 mg every 4 hours prn. Pt reports pain is well controlled with medication: continue current regimen.  4. Mood: LCSW             -antipsychotic agents: N/a 5. Neuropsych: This patient is not fully capable of making decisions on her own behalf. 6. Skin/Wound Care: Routine pressure relief measures.  7. Fluids/Electrolytes/Nutrition:  Monitor I/O. Check lytes in am. Encourage fluid intake.  8.  Multiple sclerosis with peripheral neuropathy: Question cognitive impairment at baseline 9.  HTN: Monitor blood pressures twice daily.  BP quite labile, continue Cozaar twice  daily. 10.  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 11. Osteoporosis: Note prior authorization was submitted to [facility] for Forteo ( patient has declined this in the past). 11. Anemia/ Leucocytosis:  12/22 normalized.  12. H/o colitis and chronic intermittent LLQ pain/Constipation: Last bowel movement on 12/24 after magnesium citrate, will add Colace 13. Insomnia: add amitripytline 10mg  HS  LOS: 4 days A FACE TO FACE EVALUATION WAS PERFORMED  1/25 02/25/2020, 12:19 PM

## 2020-02-26 ENCOUNTER — Inpatient Hospital Stay (HOSPITAL_COMMUNITY): Payer: Medicare PPO

## 2020-02-26 ENCOUNTER — Inpatient Hospital Stay (HOSPITAL_COMMUNITY): Payer: Medicare PPO | Admitting: Occupational Therapy

## 2020-02-26 IMAGING — MG DIGITAL SCREENING BILATERAL MAMMOGRAM WITH IMPLANTS, CAD AND TOM
9 of 14 series · 9 of 38 positions shown · non-contrast
Comparison: Previous exam(s).

CLINICAL DATA: Screening.

EXAM:
DIGITAL SCREENING BILATERAL MAMMOGRAM WITH IMPLANTS, CAD AND TOMO
The patient has prepectoral implants. Standard and implant displaced
views were performed.

[L CC]
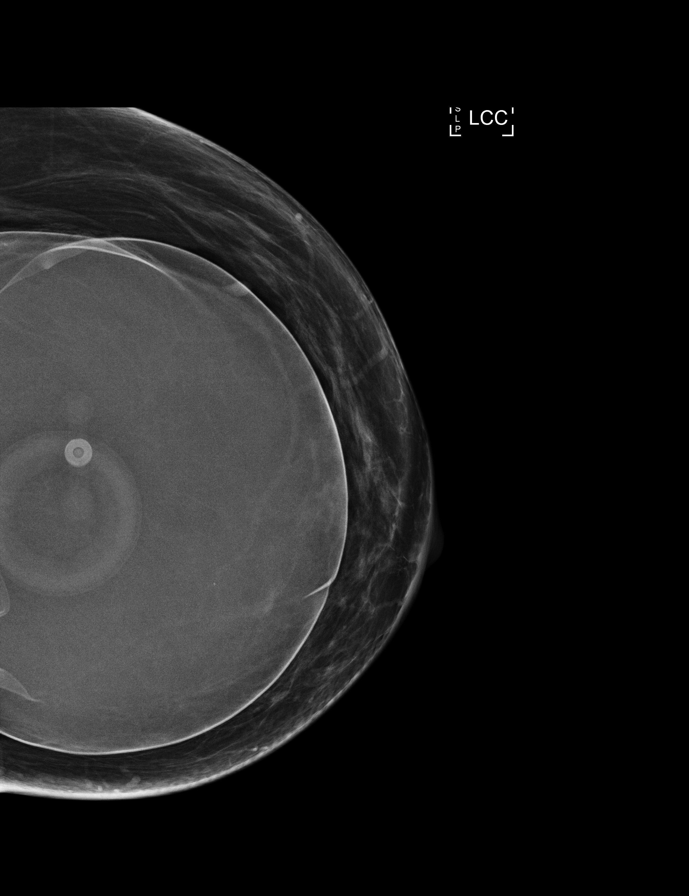

[L MLO]
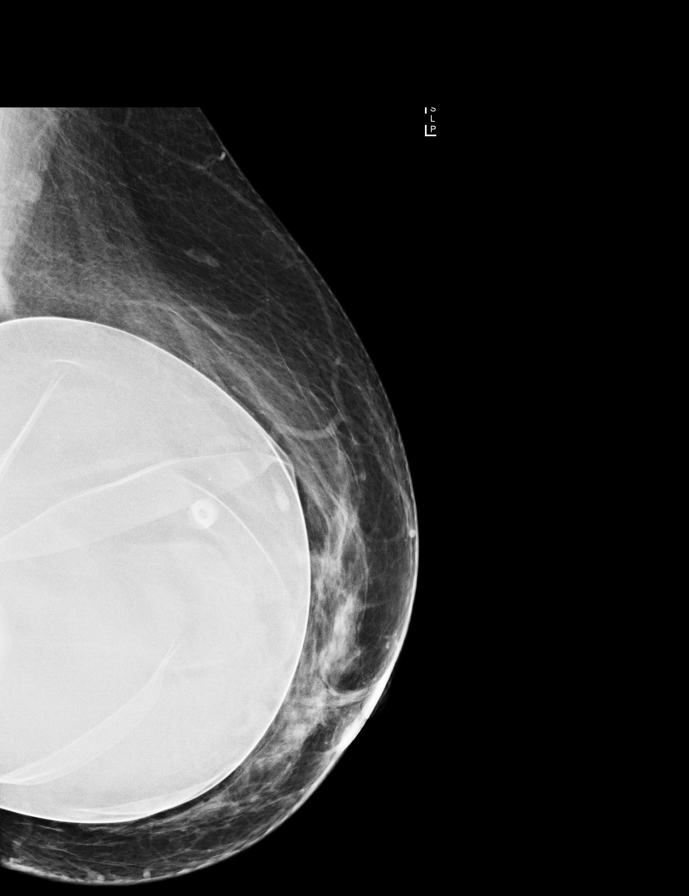

[R MLO]
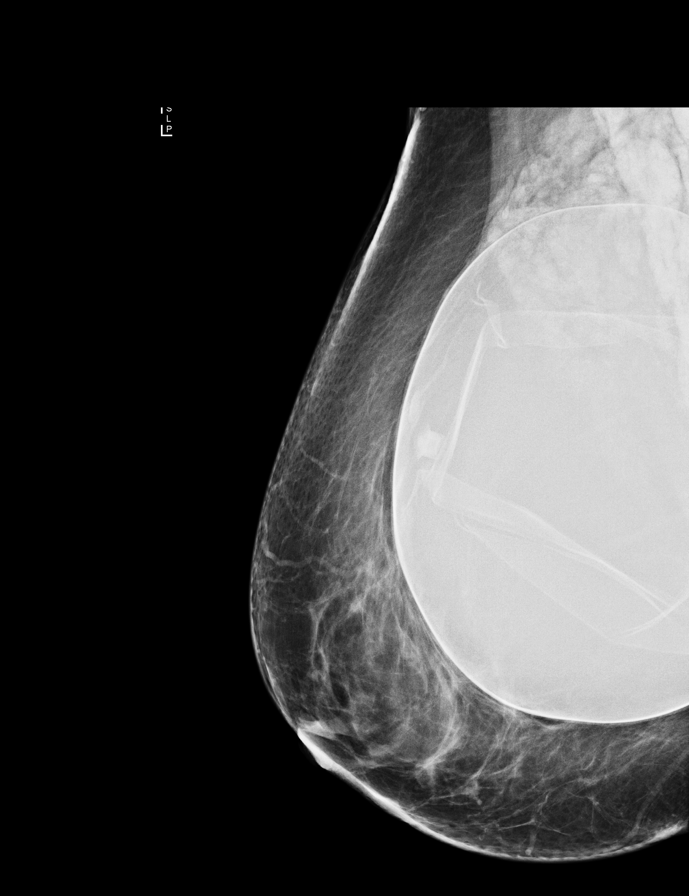

[R CC]
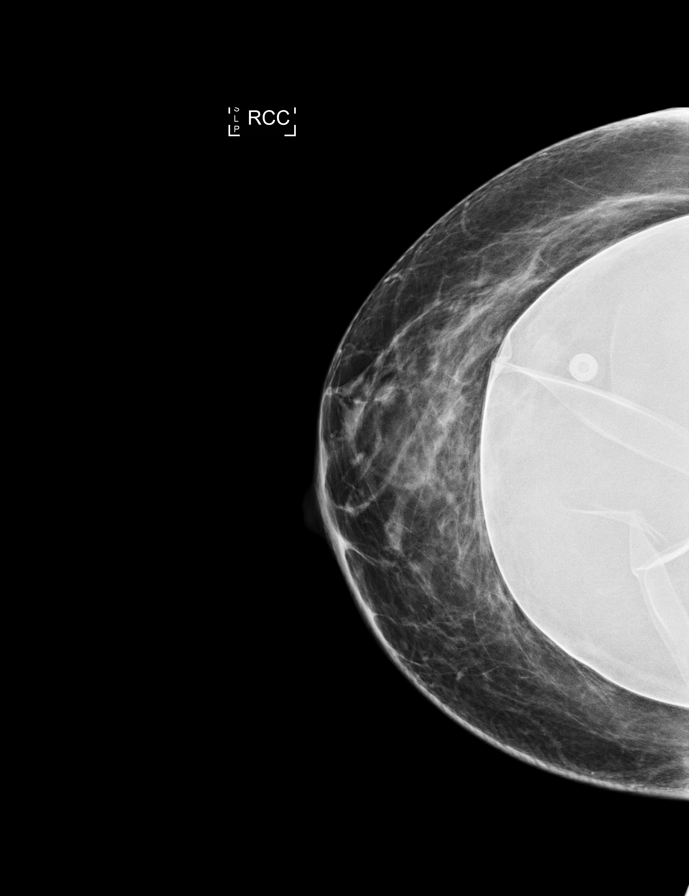

[R MLO synth-2D]
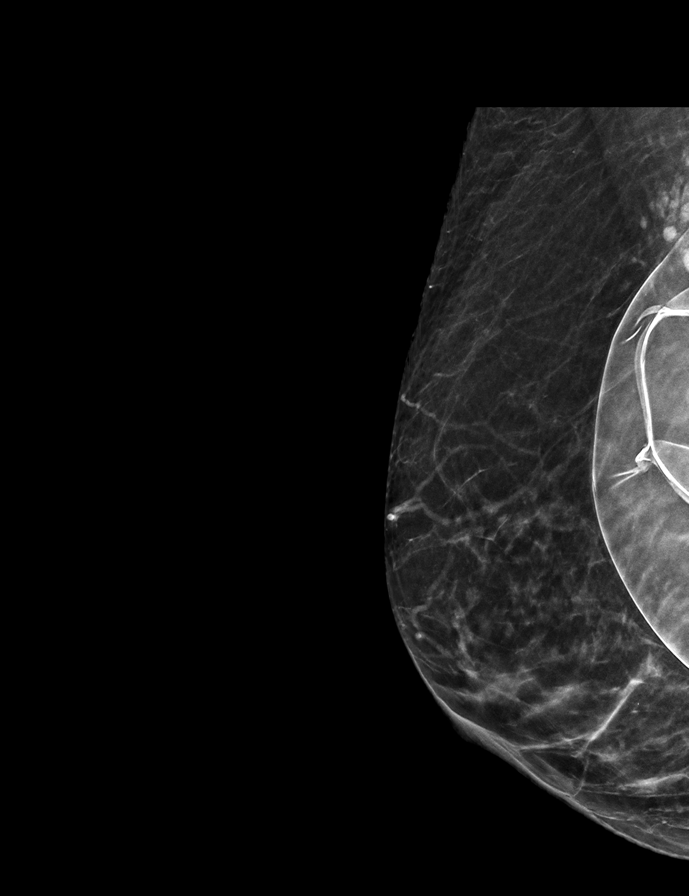

[L CC synth-2D]
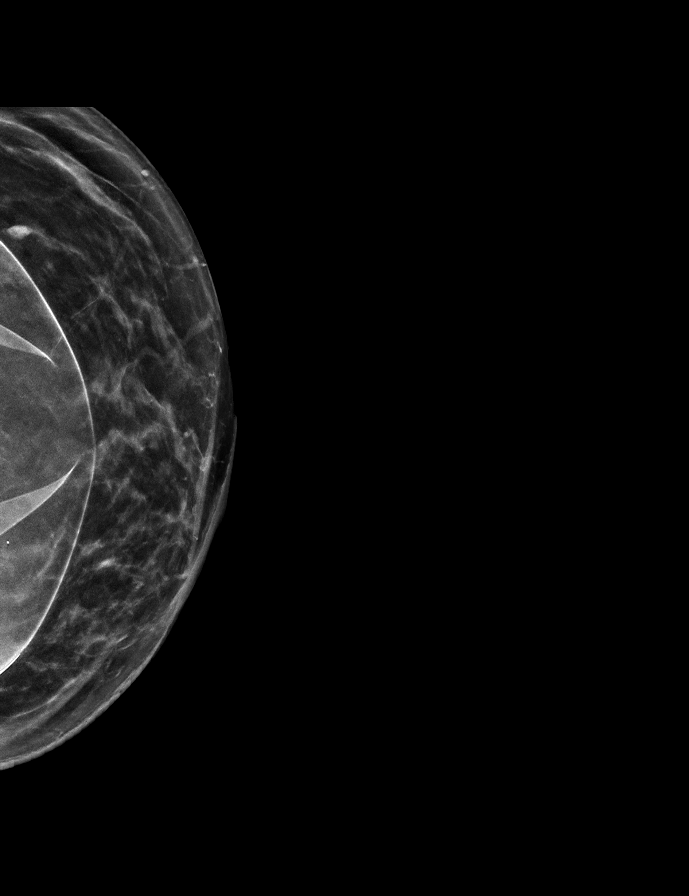

[L MLO synth-2D]
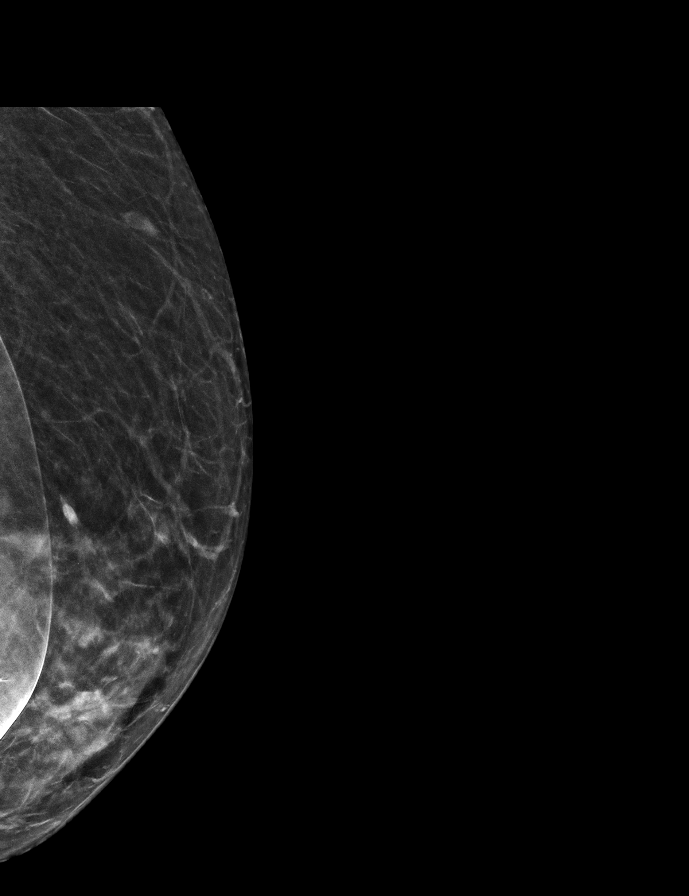

[R CC synth-2D]
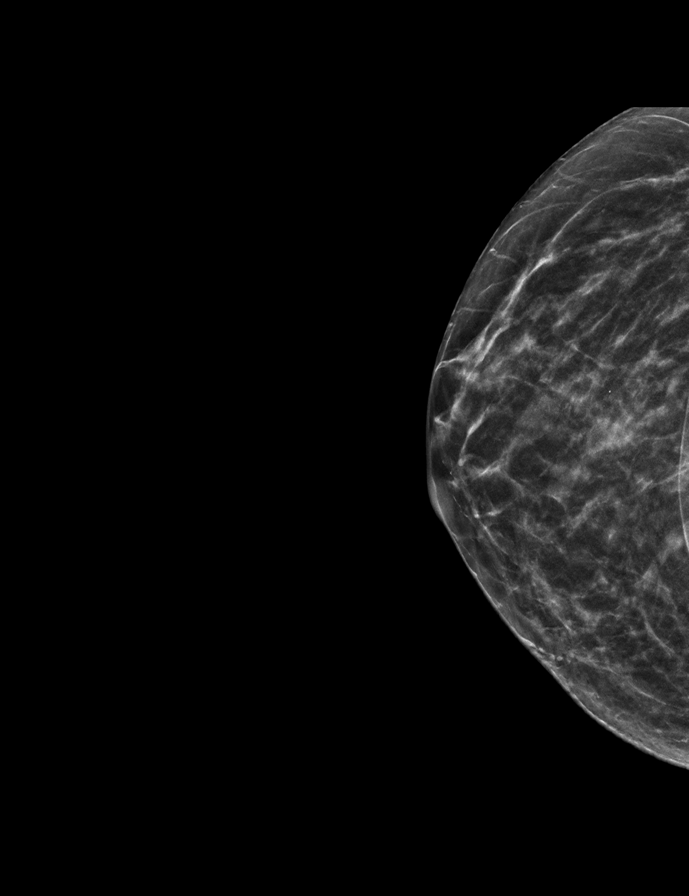

[R MLOID BREAST TOMOSYNTHESIS IMAGE tomo · tomo slice 27/53.0]
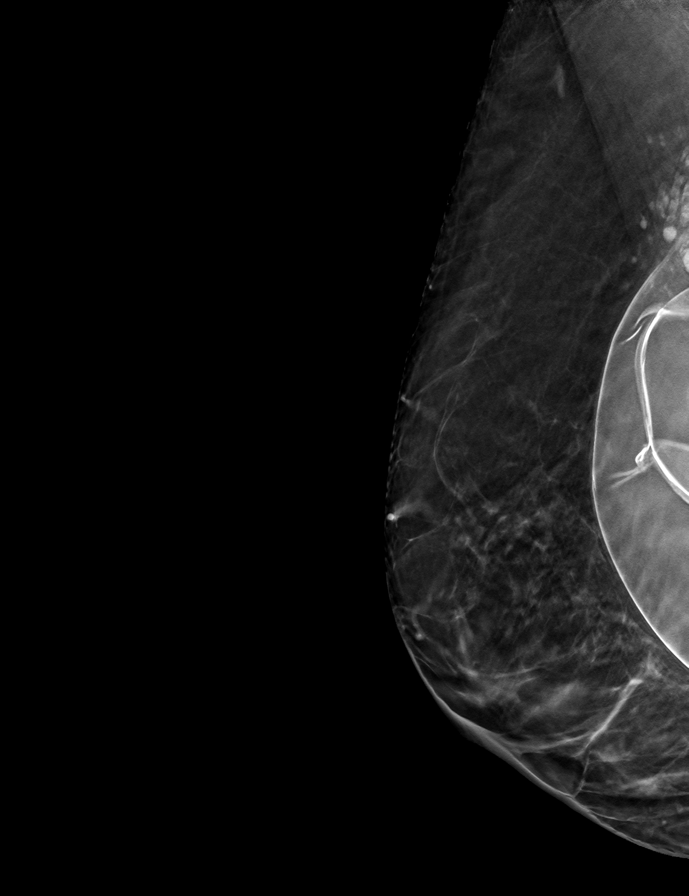

[9 of 38 positions shown; findings below may reference images not displayed]

ACR Breast Density Category b: There are scattered areas of
fibroglandular density.
FINDINGS: There are no findings suspicious for malignancy. Images were
processed with CAD.
IMPRESSION: No mammographic evidence of malignancy. A result letter of this
screening mammogram will be mailed directly to the patient.

RECOMMENDATION:
Screening mammogram in one year. (Code:15-K-3LP)

BI-RADS CATEGORY  1:  Negative.

## 2020-02-26 NOTE — Progress Notes (Signed)
[facility] PHYSICAL MEDICINE & REHABILITATION PROGRESS NOTE   Subjective/Complaints:  Caregiver is at bedside.  Patient is without new issues.  Bowels are doing well.  ROS: pain is well controlled with medications negative nausea/vomiting bladder doing okay  Objective:   No results found. No results for input(s): WBC, HGB, HCT, PLT in the last 72 hours. No results for input(s): NA, K, CL, CO2, GLUCOSE, BUN, CREATININE, CALCIUM in the last 72 hours.  Intake/Output Summary (Last 24 hours) at 02/26/2020 1025 Last data filed at 02/25/2020 1712 Gross per 24 hour  Intake 520 ml  Output --  Net 520 ml        Physical Exam: Vital Signs Blood pressure 113/76, pulse 83, temperature 98.5 F (36.9 C), resp. rate 17, height 5\' 3"  (1.6 m), weight 51.7 kg, SpO2 95 %.    General: No acute distress Mood and affect are appropriate Heart: Regular rate and rhythm no rubs murmurs or extra sounds Lungs: Clear to auscultation, breathing unlabored, no rales or wheezes Abdomen: Positive bowel sounds, soft nontender to palpation, nondistended Extremities: No clubbing, cyanosis, or edema Skin: No evidence of breakdown, no evidence of rash   Musculoskeletal:     Comments: RUE deltoid, biceps, triceps, WE, grip and finger abd 4/5- weaker than L LUE- stronger,( but hx of L wrist fx and L bad shoulder) at 5-/5 in same muscles tested- but painful LEs- 4+/5 HF, KE, DF and PF B/L  Skin:    General: Skin is warm and dry.     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     Comments: Right facial weakness.  Able to follow commands without difficulty.  Intact to light touch x4 extremities B/L  Psychiatric:        Mood and Affect: Mood  normal.        Behavior: Behavior normal.   Assessment/Plan: 1. Functional deficits which require 3+ hours per day of interdisciplinary therapy in a comprehensive inpatient rehab setting.  Physiatrist is providing close team supervision and 24 hour management of active medical problems listed below.  Physiatrist and rehab team continue to assess barriers to discharge/monitor patient progress toward functional and medical goals  Care Tool:  Bathing    Body parts bathed by patient: Right arm,Left arm,Abdomen,Front perineal area,Right upper leg,Left upper leg,Face   Body parts bathed by helper: Buttocks,Right lower leg,Left lower leg Body parts n/a: Chest   Bathing assist Assist Level: Moderate Assistance - Patient 50 - 74%     Upper Body Dressing/Undressing Upper body dressing   What is the patient wearing?: Button up shirt    Upper body assist Assist Level: Maximal Assistance - Patient 25 - 49%    Lower Body Dressing/Undressing Lower body dressing      What is the patient wearing?: Pants     Lower body assist Assist for lower body dressing: Moderate Assistance - Patient 50 - 74%     Toileting Toileting    Toileting assist Assist for toileting: Moderate Assistance - Patient 50 - 74%     Transfers Chair/bed transfer  Transfers assist     Chair/bed transfer assist level: Contact Guard/Touching assist     Locomotion Ambulation   Ambulation assist  Assist level: Contact Guard/Touching assist Assistive device: Walker-rolling Max distance: 172ft   Walk 10 feet activity   Assist     Assist level: Contact Guard/Touching assist Assistive device: Walker-rolling   Walk 50 feet activity   Assist    Assist level: Contact Guard/Touching assist Assistive device: Walker-rolling    Walk 150 feet activity   Assist Walk 150 feet activity did not occur: Safety/medical concerns (fatigue, weakness, decreased balance and endurance)  Assist level:  Contact Guard/Touching assist Assistive device: Walker-rolling    Walk 10 feet on uneven surface  activity   Assist Walk 10 feet on uneven surfaces activity did not occur: Safety/medical concerns (fatigue, weakness, decreased balance and endurance)         Wheelchair     Assist Will patient use wheelchair at discharge?: Yes Type of Wheelchair: Manual    Wheelchair assist level: Contact Guard/Touching assist Max wheelchair distance: 150    Wheelchair 50 feet with 2 turns activity    Assist        Assist Level: Contact Guard/Touching assist   Wheelchair 150 feet activity     Assist      Assist Level: Contact Guard/Touching assist   Blood pressure 113/76, pulse 83, temperature 98.5 F (36.9 C), resp. rate 17, height 5\' 3"  (1.6 m), weight 51.7 kg, SpO2 95 %.  Medical Problem List and Plan: 1.  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  Antithrombotics: -DVT/anticoagulation:  Pharmaceutical: Heparin             -antiplatelet therapy: N/A 3. Pain Management: On tylenol every 4 hours-->decrease to tid to avoid liver toxicity. Continue Oxycodone 10-15 mg every 4 hours prn. Pt reports pain is well controlled with medication: continue current regimen.  4. Mood: LCSW             -antipsychotic agents: N/a 5. Neuropsych: This patient is not fully capable of making decisions on her own behalf. 6. Skin/Wound Care: Routine pressure relief measures.  7. Fluids/Electrolytes/Nutrition:  Monitor I/O. Check lytes in am. Encourage fluid intake.  8.  Multiple sclerosis with peripheral neuropathy: Question cognitive impairment at baseline 9.  HTN: Monitor blood pressures twice daily.   BP quite labile, continue Cozaar twice daily. 10.  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 11. Osteoporosis: Note prior authorization was submitted to [facility] for Forteo ( patient has declined this in the past). 11. Anemia/ Leucocytosis:  12/22 normalized.  12. H/o colitis and chronic intermittent LLQ pain/Constipation: Last bowel movement on 12/24 after magnesium citrate,  added Colace on 12/25 had a bowel movement on 12/25  13. Insomnia: cont amitripytline 10mg  HS  LOS: 5 days A FACE TO FACE EVALUATION WAS PERFORMED  1/26 02/26/2020, 10:25 AM

## 2020-02-26 NOTE — Progress Notes (Signed)
Physical Therapy Session Note  Patient Details  Name: Michele Bryan MRN: [id number] Date of Birth: May 03, 1940  Today's Date: 02/26/2020 PT Individual Time: 1300-1403 PT Individual Time Calculation (min): 63 min   Short Term Goals: Week 1:  PT Short Term Goal 1 (Week 1): pt will perform bed mobility with supervision PT Short Term Goal 2 (Week 1): pt will transfer bed<>chair with LRAD CGA PT Short Term Goal 3 (Week 1): Pt will ambulate 25ft with LRAD CGA  Skilled Therapeutic Interventions/Progress Updates:   Reviewed written HEP with pt and daughter in law in the room for exercises she can do independently in the room at this time. See below for details.    Access Code: [email or web address]  URL: [email or web address]  Date: 02/26/2020  Prepared by: Jill Side   Exercises  Seated Long Arc Quad - 1 x daily - 7 x weekly - 3 sets - 10 reps - 5 hold  Seated Heel Raise - 1 x daily - 7 x weekly - 3 sets - 10 reps  Seated Ankle Dorsiflexion AROM - 1 x daily - 7 x weekly - 3 sets - 10 reps  Seated Isometric Hip Adduction with Ball - 1 x daily - 7 x weekly - 3 sets - 10 reps - 10 hold  Functional transfers throughout session with CGA overall and cues for technique as pt tendency for RLE to be set up outside of base of w/c wheels. Pt reports using SPC prior and would like to try this this PM. Performed x 90' with SPC and CGA overall with decreased velocity (0.55 m/sec) with pt reporting still feeling a bit unsteady. Trialled a narrow based quad cane (which pt reports she also has at home) which pt stating she feels more comfortable with and noted to be more steady x 90'. Cues for gait pattern with cane provided. Dynamic gait training with narrow based quad cane through obstacle course navigating turns and then lateral sidestepping in both directions with overall CGA to min assist with mild LOB's during turning and cues for safe placement and positioning of NBQC. Administered Berg Balance assessment  and TUG with results listed below and discussed with patient. Education provided throughout about fall risk and purpose of assessments. Pt receptive to all feedback. Handoff to OT at end of session.    Therapy Documentation Precautions:  Precautions Precautions: Cervical,Fall Required Braces or Orthoses: Other Brace Cervical Brace: Other (comment) Other Brace: Halo Restrictions Weight Bearing Restrictions: No   Pain:  Denies pain except for her chronic pain.  No intervention needed.     Balance: Balance Balance Assessed: Yes Standardized Balance Assessment Standardized Balance Assessment: Berg Balance Test Berg Balance Test Sit to Stand: Needs minimal aid to stand or to stabilize Standing Unsupported: Able to stand 30 seconds unsupported Sitting with Back Unsupported but Feet Supported on Floor or Stool: Able to sit safely and securely 2 minutes Stand to Sit: Uses backs of legs against chair to control descent Transfers: Needs one person to assist Standing Unsupported with Eyes Closed: Able to stand 3 seconds Standing Ubsupported with Feet Together: Needs help to attain position and unable to hold for 15 seconds From Standing, Reach Forward with Outstretched Arm: Loses balance while trying/requires external support From Standing Position, Pick up Object from Floor: Unable to pick up shoe, but reaches 2-5 cm (1-2") from shoe and balances independently (able to pick up with min assist) From Standing Position, Turn to Look Behind Over each Shoulder: Needs supervision when turning (rotates  body due to HALO) Turn 360 Degrees: Needs assistance while turning Standing Unsupported, Alternately Place Feet on Step/Stool: Able to complete >2 steps/needs minimal assist Standing Unsupported, One Foot in Front: Loses balance while stepping or standing Standing on One Leg: Unable to try or needs assist to prevent fall Total Score: 16  TUG (normal) = Trial 1 = 51 sec, trial 2 =  63 sec, and  trial 3 = 62 sec; average of 58.67 sec  Fall risk assessment results discussed with patient and verbalized understanding.      Therapy/Group: Individual Therapy  Karolee Stamps Darrol Poke, PT, DPT, CBIS  02/26/2020, 2:09 PM

## 2020-02-26 NOTE — Progress Notes (Signed)
Physical Therapy Session Note  Patient Details  Name: Michele Bryan MRN: [id number] Date of Birth: 03-18-1940  Today's Date: 02/26/2020 PT Individual Time: 1115-1157 PT Individual Time Calculation (min): 42 min   Short Term Goals: Week 1:  PT Short Term Goal 1 (Week 1): pt will perform bed mobility with supervision PT Short Term Goal 2 (Week 1): pt will transfer bed<>chair with LRAD CGA PT Short Term Goal 3 (Week 1): Pt will ambulate 69ft with LRAD CGA  Skilled Therapeutic Interventions/Progress Updates:    Pt reports she did not sleep much last night and is pretty tired today. agreeable to session and performed within tolerance. Functional bed mobility with overall supervision using bedrails for support. CGA to min assist for ambulatory hand held assist transfer w/c <> bed and w/c <> mat table in therapy gym. Focused on blocked practice sit <> stands on compliant surface for balance retraining and functional strengthening x 10 reps with CGA to min assist with cues for technique as pt relies on pushing back through her legs against the mat. Functional gait training with L HHA with min assist overall x 50' for mobility training and general strengthening. Pt asking about exercises in her room she can do. Demonstrated a few to her and will make a printout for her this PM session. Discussed h/o of falls and surgeries and fall risk in general. Pt does report being fearful of falling and eager to learn what she can in therapies. End of session transferred back to bed with daughter inlaw at bedside.   Therapy Documentation Precautions:  Precautions Precautions: Cervical,Fall Required Braces or Orthoses: Other Brace Cervical Brace: Other (comment) Other Brace: Halo Restrictions Weight Bearing Restrictions: No    Pain:  Reports chronic pain that she has been premedicated for.    Therapy/Group: Individual Therapy  Karolee Stamps Darrol Poke, PT, DPT, CBIS  02/26/2020, 12:42 PM

## 2020-02-26 NOTE — Progress Notes (Addendum)
Occupational Therapy Session Note  Patient Details  Name: Michele Bryan MRN: [id number] Date of Birth: January 12, 1941  Today's Date: 02/26/2020 OT Individual Time: 0900-1000 OT Individual Time Calculation (min): 60 min    Short Term Goals: Week 1:  OT Short Term Goal 1 (Week 1): Pt will complete UB dressing with min assist OT Short Term Goal 2 (Week 1): Pt will complete toilet transfer with min assist OT Short Term Goal 3 (Week 1): Pt will complete bathing CGA at sit > stand level  Skilled Therapeutic Interventions/Progress Updates:  First session 9-10 60 individual minutes: Much education was provided to Michele Bryan and her dtr in law, including dtr demonstrated safety for completing safe functional mobility and transfers in patient room and was checked off on patient education status sheet on bathroom door in her room.   Michele Bryan was educated on wear stretchy large shirts and cutting down the middle to add adhesive backed velcro for easier overhead and puling around th eback due to large halo hardware into and around her head.   As well she educated on use of reacher for obtaining items off floor.   She did not require reacher use to don pants.   She does need more practice for sit to stand and she required moderate assistance and had difficulty maintaining midline posture due to the weight of the halo head neck metal brace.   She completed shower transfer with CGA onto tub transfer benchand use of shower railing.   She completing toileting and toilet transfer with Close S.  She was able to use various adaptive equipment and stated she will purchase some items.  She was left on side of bed with her dtr in law standing behind  Continue OT Plan of care  2nd session 1400-1500 60 indivudla minutes: Patient and caregiver training continued though patient stated she was very  Fatigued from the weight of the metal halo, her therapies, 4 hours sleep last night, and "the noise and all the sounds  at night here."  She asked to go over her occupational therapy goals; she and dtr in law completed simluated bathing with sponge and long towel wet in the middle to wash between her legs in standing and standing to wash back with middle of wet in the middle towel.   Due to patient complaints of extra fatigue this afternoon, she required CGA to maintain balance in dynamic standing positions.    As well, she and dtr and in law completed continued adaptive equipment training as well as more practice for sit to stand.  Today she require much cueing for technique to scoot forward on front of surface before standing, as well as extra time, CGA to min A.  She was left seated in her w/c with her very, very supportive dtr in law at the end of the session.  Patient will benefit from more opportunities in training for sit to stand and functional mobility for self care and simple meal prep.  Continue OT Plan of care.     Therapy Documentation Precautions:  Precautions Precautions: Cervical,Fall Required Braces or Orthoses: Other Brace Cervical Brace: Other (comment) Other Brace: Halo Restrictions Weight Bearing Restrictions: No    Pain: denied Therapy/Group: Individual Therapy  Bud Face [facility] 02/26/2020, 1:20 PM

## 2020-02-27 ENCOUNTER — Inpatient Hospital Stay (HOSPITAL_COMMUNITY): Payer: Medicare PPO

## 2020-02-27 ENCOUNTER — Inpatient Hospital Stay (HOSPITAL_COMMUNITY): Payer: Medicare PPO | Admitting: Occupational Therapy

## 2020-02-27 DIAGNOSIS — K5903 Drug induced constipation: Secondary | ICD-10-CM

## 2020-02-27 DIAGNOSIS — D638 Anemia in other chronic diseases classified elsewhere: Secondary | ICD-10-CM

## 2020-02-27 DIAGNOSIS — I10 Essential (primary) hypertension: Secondary | ICD-10-CM

## 2020-02-27 DIAGNOSIS — I951 Orthostatic hypotension: Secondary | ICD-10-CM

## 2020-02-27 DIAGNOSIS — K5900 Constipation, unspecified: Secondary | ICD-10-CM

## 2020-02-27 LAB — CBC
HCT: 34 % — ABNORMAL LOW (ref 36.0–46.0)
Hemoglobin: 10.5 g/dL — ABNORMAL LOW (ref 12.0–15.0)
MCH: 26.8 pg (ref 26.0–34.0)
MCHC: 30.9 g/dL (ref 30.0–36.0)
MCV: 86.7 fL (ref 80.0–100.0)
Platelets: 458 10*3/uL — ABNORMAL HIGH (ref 150–400)
RBC: 3.92 MIL/uL (ref 3.87–5.11)
RDW: 14 % (ref 11.5–15.5)
WBC: 6.8 10*3/uL (ref 4.0–10.5)
nRBC: 0 % (ref 0.0–0.2)

## 2020-02-27 LAB — BASIC METABOLIC PANEL
Anion gap: 11 (ref 5–15)
BUN: 19 mg/dL (ref 8–23)
CO2: 26 mmol/L (ref 22–32)
Calcium: 9.8 mg/dL (ref 8.9–10.3)
Chloride: 98 mmol/L (ref 98–111)
Creatinine, Ser: 0.9 mg/dL (ref 0.44–1.00)
GFR, Estimated: >60 mL/min (ref >60)
Glucose, Bld: 101 mg/dL — ABNORMAL HIGH (ref 70–99)
Potassium: 4.5 mmol/L (ref 3.5–5.1)
Sodium: 135 mmol/L (ref 135–145)

## 2020-02-27 NOTE — Progress Notes (Signed)
Physical Therapy Session Note  Patient Details  Name: Michele Bryan MRN: [id number] Date of Birth: [birth year]  Today's Date: 02/27/2020 PT Individual Time: 1400-1500 PT Individual Time Calculation (min): 60 min   Short Term Goals: Week 1:  PT Short Term Goal 1 (Week 1): pt will perform bed mobility with supervision PT Short Term Goal 2 (Week 1): pt will transfer bed<>chair with LRAD CGA PT Short Term Goal 3 (Week 1): Pt will ambulate 75ft with LRAD CGA  Skilled Therapeutic Interventions/Progress Updates:   Received pt supine in bed, pt agreeable to therapy, and denied any pain during session. Session with emphasis on family education training, functional mobility/transfers, generalized strengthening, dynamic standing balance/coordination, ambulation, simulated car transfers, curb navigation, and improved activity tolerance. Pt's son requesting moving up pt's D/C date, stating that pt will have 24/7 assist at home. Therapist notified treatment team and started family education training today. Pt transferred bed<>WC stand<>pivot with quad cane and CGA provided by pt's son with cues for body positioning and hand placement. Pt transported to ortho gym in [facility] total A for time management purposes and performed simulated car transfer with quad cane and CGA with verbal cues for safe entry and ambulated 29ft on uneven surfaces (ramp and mulch) with quad cane with min A provided by pt's son. Pt transported to therapy gym in [facility] total A and navigated 1 5in curb with quad cane and min A x 2 trials to simulate threshold at garage entry at home. Trial 1 with therapist and trail 2 with pt's son with cues for AD management/placement and overall technique. Discussed equipment at discharge and benefits/risks of quad cane vs RW as well as options for therapy after discharge with pt's son ultimately requesting HHPT. Pt ambulated >268ft with quad cane and CGA/min A provided by pt's son back to room. Pt with 2 mild LOB,  however pt's son demonstrated good understanding of body mechanics and positioning and able to assist pt. Pt ambulated into bathroom and son provided min A with clothing management when toileting. Concluded session with pt sitting EOB with son present attending to care. Therapist cleared pt's son, Cleone Slim, to assist with transfers and toileting. Safety plan updated.   Therapy Documentation Precautions:  Precautions Precautions: Cervical,Fall Required Braces or Orthoses: Other Brace Cervical Brace: Other (comment) Other Brace: Halo Restrictions Weight Bearing Restrictions: No   Therapy/Group: Individual Therapy Martin Majestic PT, DPT   02/27/2020, 7:29 AM

## 2020-02-27 NOTE — Progress Notes (Signed)
[place] PHYSICAL MEDICINE & REHABILITATION PROGRESS NOTE   Subjective/Complaints: Patient seen sitting up in bed this morning.  She states she slept better overnight.  Son requested her vital signs to be taken later-discussed with nursing.  She notes intermittent numbness and right C8-T1 distribution.  ROS: Denies CP, SOB, N/V/D  Objective:   No results found. Recent Labs    02/27/20 0549  WBC 6.8  HGB 10.5*  HCT 34.0*  PLT 458*   Recent Labs    02/27/20 0549  NA 135  K 4.5  CL 98  CO2 26  GLUCOSE 101*  BUN 19  CREATININE 0.90  CALCIUM 9.8    Intake/Output Summary (Last 24 hours) at 02/27/2020 1502 Last data filed at 02/27/2020 1250 Gross per 24 hour  Intake 480 ml  Output -  Net 480 ml        Physical Exam: Vital Signs Blood pressure (!) 107/49, pulse 81, temperature 98 F (36.7 C), temperature source Oral, resp. rate 16, height 5\' 3"  (1.6 m), weight 51.7 kg, SpO2 100 %.  Constitutional: No distress . Vital signs reviewed. HENT: Normocephalic.   +Halo. Eyes: EOMI. No discharge. Cardiovascular: No JVD.   Respiratory: Normal effort.  No stridor.   GI: Non-distended.   Skin: Warm and dry.  Intact. Psych: Normal mood.  Normal behavior. Musc: No edema in extremities.  No tenderness in extremities. Ulnar deviation of digits Neuro: Alert Motor: B/l UE: 4/5 proximal to distal B/l LE: HF, KE 4-4+/5, ADF 5/5 Sensation intact to light touch  Assessment/Plan: 1. Functional deficits which require 3+ hours per day of interdisciplinary therapy in a comprehensive inpatient rehab setting.  Physiatrist is providing close team supervision and 24 hour management of active medical problems listed below.  Physiatrist and rehab team continue to assess barriers to discharge/monitor patient progress toward functional and medical goals  Care Tool:  Bathing    Body parts bathed by patient: Right arm,Left arm,Abdomen,Front perineal area,Right upper leg,Left upper  leg,Face   Body parts bathed by helper: Buttocks,Right lower leg,Left lower leg Body parts n/a: Chest   Bathing assist Assist Level: Moderate Assistance - Patient 50 - 74%     Upper Body Dressing/Undressing Upper body dressing   What is the patient wearing?: Button up shirt    Upper body assist Assist Level: Maximal Assistance - Patient 25 - 49%    Lower Body Dressing/Undressing Lower body dressing      What is the patient wearing?: Pants     Lower body assist Assist for lower body dressing: Moderate Assistance - Patient 50 - 74%     Toileting Toileting    Toileting assist Assist for toileting: Moderate Assistance - Patient 50 - 74%     Transfers Chair/bed transfer  Transfers assist     Chair/bed transfer assist level: Contact Guard/Touching assist     Locomotion Ambulation   Ambulation assist      Assist level: Contact Guard/Touching assist Assistive device: Cane-quad Max distance: 120'   Walk 10 feet activity   Assist     Assist level: Contact Guard/Touching assist Assistive device: Cane-quad   Walk 50 feet activity   Assist    Assist level: Minimal Assistance - Patient > 75% Assistive device: Cane-quad    Walk 150 feet activity   Assist Walk 150 feet activity did not occur: Safety/medical concerns (fatigue, weakness, decreased balance and endurance)  Assist level: Contact Guard/Touching assist Assistive device: Walker-rolling    Walk 10 feet on uneven surface  activity   Assist Walk 10 feet on uneven surfaces activity did not occur: Safety/medical concerns (fatigue, weakness, decreased balance and endurance)         Wheelchair     Assist Will patient use wheelchair at discharge?: Yes Type of Wheelchair: Manual    Wheelchair assist level: Contact Guard/Touching assist Max wheelchair distance: 150    Wheelchair 50 feet with 2 turns activity    Assist        Assist Level: Contact Guard/Touching assist    Wheelchair 150 feet activity     Assist      Assist Level: Contact Guard/Touching assist    Medical Problem List and Plan: 1.  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  Antithrombotics: -DVT/anticoagulation:  Pharmaceutical: Heparin             -antiplatelet therapy: N/A 3. Pain Management: On tylenol every 4 hours-->decrease to tid to avoid liver toxicity.   Continue Oxycodone 10-15 mg every 4 hours prn.   Controlled with meds on 12/27 4. Mood: LCSW             -antipsychotic agents: N/a 5. Neuropsych: This patient is not fully capable of making decisions on her own behalf. 6. Skin/Wound Care: Routine pressure relief measures.  7. Fluids/Electrolytes/Nutrition:  Monitor I/Os.   Encourage fluid intake.  8.  Multiple sclerosis with peripheral neuropathy: Question cognitive impairment at baseline 9.  HTN: Monitor blood pressures twice daily.    Cozaar twice daily.  Labile, with orthostasis on 12/27-will consider medication reduction if persistent 10.  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 11. Osteoporosis: Note prior authorization was submitted to [facility] for Forteo ( patient has declined this in the past). 11.  Leukocytosis: Resolved 12. H/o colitis and chronic intermittent LLQ pain/drug-induced constipation:  Added Colace on 12/25   Improving 13.  Sleep disturbance: cont amitriptyline 10mg  HS 14.  Anemia of chronic disease  Hemoglobin 10.5 on 12/27, continue to monitor  LOS: 6 days A FACE TO FACE EVALUATION WAS PERFORMED  Ankit 1/28 02/27/2020, 3:02 PM

## 2020-02-27 NOTE — Progress Notes (Signed)
Occupational Therapy Session Note  Patient Details  Name: Michele Bryan MRN: [id number] Date of Birth: 03/14/1940  Today's Date: 02/27/2020 OT Individual Time: 1000-1100 OT Individual Time Calculation (min): 60 min    Short Term Goals: Week 1:  OT Short Term Goal 1 (Week 1): Pt will complete UB dressing with min assist OT Short Term Goal 2 (Week 1): Pt will complete toilet transfer with min assist OT Short Term Goal 3 (Week 1): Pt will complete bathing CGA at sit > stand level  Skilled Therapeutic Interventions/Progress Updates:    Patient seated in bed, alert, cooperative and denies pain.  She is able to move to edge of bed with CS.  Sit to stand and ambulation with NBQC to/from bed, commode, shower seat, sofa, arm chair with CGA - reviewed and demonstrated safe mobility in home environment for back safety and on carpeted surface.  Reviewed and practiced stepping over threshold with multiple attempts - CGA needed and ongoing cues for sequencing.  Completed standing exercises with bilateral upper body support with good tolerance.  She returned to bed at close of session.  CS for edge of bed to long sit position in bed.  Bed alarm set and call bell in hand.    Therapy Documentation Precautions:  Precautions Precautions: Cervical,Fall Required Braces or Orthoses: Other Brace Cervical Brace: Other (comment) Other Brace: Halo Restrictions Weight Bearing Restrictions: No   Therapy/Group: Individual Therapy  Barrie Lyme 02/27/2020, 7:39 AM

## 2020-02-27 NOTE — Progress Notes (Signed)
Verbal orders obtained from Dr. Allena Katz for vital signs to be taken later on in the morning to not disturb patient. Patient and nurse agreed on 630 am and later for vital signs. Patient states she doesn't mind waking up early, but it disturbs her son when they come into the room and he stays up late. Nurse will leave sign outside of room per charge nurse request and pass along to night shift.

## 2020-02-27 NOTE — Progress Notes (Signed)
Physical Therapy Session Note  Patient Details  Name: Michele Bryan MRN: [id number] Date of Birth: 26-Mar-1940  Today's Date: 02/27/2020 PT Individual Time: 0830-0900 PT Individual Time Calculation (min): 30 min   Short Term Goals: Week 1:  PT Short Term Goal 1 (Week 1): pt will perform bed mobility with supervision PT Short Term Goal 2 (Week 1): pt will transfer bed<>chair with LRAD CGA PT Short Term Goal 3 (Week 1): Pt will ambulate 75ft with LRAD CGA  Skilled Therapeutic Interventions/Progress Updates:    Pt already dressed and seated EOB ready for therapy reporting son assisted her this morning. Pt does report better sleep last night. Functional transfer to w/c with HHA and cues for foot placement and technique. Focused on stair negotiation training using 3" steps this morning initially with bilateral handrails x 8 and then with single rail on L ascending x 8 steps with CGA/min assist overall and cues for foot placement and sequencing. Discussed that her husband may install a rail on the L ascending. Only has 1 step at home, so plan to progress to curb step later in PM session (unabe to access step with correct height this morning due to environment). Also discussed option to use cane as a second support if only 1 rail available. Gait training with NBQC x 120' with 1 standing rest break and slow gait velocity. Step to pattern noted and tendency to keep head/trunk flexed - cues for more upright posture. Transferred back to bed end of session with son at bedside to assist with doffing of shoes.   Therapy Documentation Precautions:  Precautions Precautions: Cervical,Fall Required Braces or Orthoses: Other Brace Cervical Brace: Other (comment) Other Brace: Halo Restrictions Weight Bearing Restrictions: No    Pain: Reports some discomfort in her neck down to her back. Suspect may be postural due to weight of HALO and her compensatory strategies. Education provided on awareness of her  posture and support in her back while seated or in bed as well.     Therapy/Group: Individual Therapy  Karolee Stamps Darrol Poke, PT, DPT, CBIS  02/27/2020, 10:46 AM

## 2020-02-27 NOTE — Progress Notes (Signed)
Patient ID: Michele Bryan, female   DOB: 12-10-1940, 79 y.o.   MRN: [id number] Team feels pt would like to discharge Thursday 12/30, if ok with MD. Son here to do family education today. Will order [facility] and look for home health services. Await MD input.

## 2020-02-27 NOTE — Progress Notes (Signed)
Physical Therapy Session Note  Patient Details  Name: Michele Bryan MRN: [id number] Date of Birth: May 24, 1940  Today's Date: 02/27/2020 PT Individual Time: 1300-1330 PT Individual Time Calculation (min): 30 min   Short Term Goals: Week 1:  PT Short Term Goal 1 (Week 1): pt will perform bed mobility with supervision PT Short Term Goal 2 (Week 1): pt will transfer bed<>chair with LRAD CGA PT Short Term Goal 3 (Week 1): Pt will ambulate 68ft with LRAD CGA  Skilled Therapeutic Interventions/Progress Updates:    Session focused on functional transfers with NBQC, stair negotiation with curb step to simulate home entry, furniture transfers in ADL apartment including low couch and regular bed, and gait training with NBQC.   Pt still requires cues for foot placement and anterior weightshifts for sit <> stands with CGA overall this session for balance. 4 reps of up/down for curb step negotiation with NBQC with min assist initially progressing to CGA for balance and cues for foot placement and sequencing with cane placement. Discussed that if husband was able to install grab bar on the stairs, she could use cane and rail for BUE support.   Furniture transfers in ADL apartment with CGA and improved technique for sit > stands from lower surface. Gait training over carpeted surface with CGA for balance during turns. Bed mobility at supervision level on flat bed and elevated bed to simulate home environment.   Gait training in hallway with busy setting to challenge balance and dual task during conversation with CGA overall x 100'. Occasional cues for increased BOS as tendency for narrow BOS.   End of session son with questions about discharge and potentially having a shorter LOS. Educated on conference schedule and options for earlier discharge is possible if medically cleared but may require more assistance (CGA/min assist) vs staying on rehab longer to reach more supervision/modified independent levels.  Son and patient plan to discuss with each other and with primary therapists as well. Will notify team of potential request for shorter ELOS. Son expressed some concerns with regards for patients ability to sleep here, increasing COVID numbers, etc and that they have available min assist 24/7 from his wife available at home.   Therapy Documentation Precautions:  Precautions Precautions: Cervical,Fall Required Braces or Orthoses: Other Brace Cervical Brace: Other (comment) Other Brace: Halo Restrictions Weight Bearing Restrictions: No  Pain:  Reports some pain near incision site. No intervention needed at this time. Reinforced posture and positioning as well.    Therapy/Group: Individual Therapy    Karolee Stamps Darrol Poke, PT, DPT, CBIS  02/27/2020, 1:54 PM

## 2020-02-27 NOTE — Plan of Care (Signed)
  Problem: RH Car Transfers Goal: LTG Patient will perform car transfers with assist (PT) Description: LTG: Patient will perform car transfers with assistance (PT). Flowsheets (Taken 02/27/2020 1549) LTG: Pt will perform car transfers with assist:: (downgraded due to decreased balance/postural control and generalized weakness) Contact Guard/Touching assist Note: downgraded due to decreased balance/postural control and generalized weakness   Problem: RH Ambulation Goal: LTG Patient will ambulate in controlled environment (PT) Description: LTG: Patient will ambulate in a controlled environment, # of feet with assistance (PT). Flowsheets (Taken 02/27/2020 1549) LTG: Pt will ambulate in controlled environ  assist needed:: (downgraded due to decreased balance/postural control, generalized weakness, and decreased endurance) Contact Guard/Touching assist LTG: Ambulation distance in controlled environment: 184ft with LRAD Note: downgraded due to decreased balance/postural control, generalized weakness, and decreased endurance Goal: LTG Patient will ambulate in home environment (PT) Description: LTG: Patient will ambulate in home environment, # of feet with assistance (PT). Flowsheets (Taken 02/27/2020 1549) LTG: Pt will ambulate in home environ  assist needed:: (downgraded due to decreased balance/postural control, generalized weakness, and decreased endurance) Contact Guard/Touching assist LTG: Ambulation distance in home environment: 112ft with LRAD Note: downgraded due to decreased balance/postural control, generalized weakness, and decreased endurance   Problem: RH Stairs Goal: LTG Patient will ambulate up and down stairs w/assist (PT) Description: LTG: Patient will ambulate up and down # of stairs with assistance (PT) Flowsheets (Taken 02/27/2020 1549) LTG: Pt will ambulate up/down stairs assist needed:: (downgraded due to decreased balance and generalized weakness) Minimal Assistance - Patient  > 75% Note: downgraded due to decreased balance and generalized weakness

## 2020-02-28 ENCOUNTER — Inpatient Hospital Stay (HOSPITAL_COMMUNITY): Payer: Medicare PPO | Admitting: Occupational Therapy

## 2020-02-28 ENCOUNTER — Inpatient Hospital Stay (HOSPITAL_COMMUNITY): Payer: Medicare PPO | Admitting: Physical Therapy

## 2020-02-28 DIAGNOSIS — I10 Essential (primary) hypertension: Secondary | ICD-10-CM

## 2020-02-28 MED ORDER — LOSARTAN POTASSIUM 50 MG PO TABS
25.0000 mg | ORAL_TABLET | Freq: Two times a day (BID) | ORAL | Status: DC
  Administered 2020-02-28 – 2020-03-01 (×4): 25 mg via ORAL
  Filled 2020-02-28 (×4): qty 1

## 2020-02-28 MED ORDER — OXYCODONE HCL 5 MG PO TABS
10.0000 mg | ORAL_TABLET | ORAL | Status: DC | PRN
  Administered 2020-02-29 (×2): 10 mg via ORAL
  Filled 2020-02-28 (×2): qty 2

## 2020-02-28 NOTE — Progress Notes (Signed)
Occupational Therapy Session Note  Patient Details  Name: Michele Bryan MRN: [id number] Date of Birth: 12/29/1940  Today's Date: 02/28/2020 OT Individual Time: 1258-1415 OT Individual Time Calculation (min): 77 min    Short Term Goals: Week 1:  OT Short Term Goal 1 (Week 1): Pt will complete UB dressing with min assist OT Short Term Goal 2 (Week 1): Pt will complete toilet transfer with min assist OT Short Term Goal 3 (Week 1): Pt will complete bathing CGA at sit > stand level  Skilled Therapeutic Interventions/Progress Updates:    Patient seated in arm chair, she denies pain.  Provided set up for lunch tray.  She requires assist for cutting meat and has some loss of food due to difficulty with spoon to mouth.  Husband present for start of session.  Reviewed and practiced stepping over threshold and safe use of grab bars in bathroom environment.  Patient and husband with good carryover.  Discussed and practiced shower seat with CGA.  Patient ambulated x 3 this session with CGA using NBQC but limited distance due to fatigue.  Completed seated towel slide activities for bilateral shoulder AROM.  Discussed and reviewed strategies to support left UE due to shoulder pain and immobility.  Discussed and provided information on noise cancelling ear buds to use as sleep aide.  She sat in arm chair in room at close of session, seat belt alarm set and call bell / tray table in reach.    Therapy Documentation Precautions:  Precautions Precautions: Cervical,Fall Required Braces or Orthoses: Other Brace Cervical Brace: Other (comment) Other Brace: Halo Restrictions Weight Bearing Restrictions: No  Therapy/Group: Individual Therapy  Barrie Lyme 02/28/2020, 7:43 AM

## 2020-02-28 NOTE — Progress Notes (Signed)
Patient ID: Michele Bryan, female   DOB: May 06, 1940, 79 y.o.   MRN: [id number] MD on board with pt going home 12/30 per her request. Son completed family education yesterday. Prepare for discharge Thursday.

## 2020-02-28 NOTE — Progress Notes (Signed)
Inpatient Rehabilitation Care Coordinator Discharge Note  The overall goal for the admission was met for:   Discharge location: Yes-HOME WITH HUSBAND AND SON AND DAUGHTER IN-[place] 24/7 SUPERVISION  Length of Stay: Yes-8 DAYS  Discharge activity level: Yes-SUPERVISION WITH CUEING  Home/community participation: Yes  Services provided included: MD, RD, PT, OT, RN, CM, Pharmacy and SW  Financial Services: Private Insurance: [place] offered to/list presented to:YES  Follow-up services arranged: Home Health: [facility], DME: [facility] and Patient/Family has no preference for HH/DME agencies  Comments (or additional information):HUSBAND AND SON WERE HERE FOR FAMILY TRAINING AND ALL COMFORTABLE WITH PT'S CARE AND [place]  Patient/Family verbalized understanding of follow-up arrangements: Yes  Individual responsible for coordination of the follow-up plan: CARL-HUSBAND [phone]  Confirmed correct DME delivered: Elease Hashimoto 02/28/2020    Savan Ruta, Gardiner Rhyme

## 2020-02-28 NOTE — Progress Notes (Signed)
[place] PHYSICAL MEDICINE & REHABILITATION PROGRESS NOTE   Subjective/Complaints: Patient seen sitting up in bed this morning. She states she did not sleep well overnight because her vitals were taken at 2 AM, and she cannot go to sleep afterwards. However, this does not appear accurate. Discussed with team discharge date.  ROS: Denies CP, SOB, N/V/D  Objective:   No results found. Recent Labs    02/27/20 0549  WBC 6.8  HGB 10.5*  HCT 34.0*  PLT 458*   Recent Labs    02/27/20 0549  NA 135  K 4.5  CL 98  CO2 26  GLUCOSE 101*  BUN 19  CREATININE 0.90  CALCIUM 9.8    Intake/Output Summary (Last 24 hours) at 02/28/2020 1038 Last data filed at 02/27/2020 1250 Gross per 24 hour  Intake 240 ml  Output --  Net 240 ml        Physical Exam: Vital Signs Blood pressure 108/62, pulse 83, temperature 97.8 F (36.6 C), temperature source Oral, resp. rate 16, height 5\' 3"  (1.6 m), weight 47.7 kg, SpO2 100 %.  Constitutional: No distress . Vital signs reviewed. HENT: Normocephalic. + Halo. Eyes: EOMI. No discharge. Cardiovascular: No JVD.  RRR. Respiratory: Normal effort.  No stridor.  Bilateral clear to auscultation. GI: Non-distended.  BS +. Skin: Warm and dry. Neck incision with sutures with dried blood, healing. Psych: Normal mood.  Normal behavior. Musc: No edema in extremities.  No tenderness in extremities. Ulnar deviation of digits Neuro: Alert Motor: B/l UE: 4/5 proximal to distal B/l LE: HF, KE 4-4+/5, ADF 5/5, unchanged Sensation intact to light touch  Assessment/Plan: 1. Functional deficits which require 3+ hours per day of interdisciplinary therapy in a comprehensive inpatient rehab setting.  Physiatrist is providing close team supervision and 24 hour management of active medical problems listed below.  Physiatrist and rehab team continue to assess barriers to discharge/monitor patient progress toward functional and medical goals  Care  Tool:  Bathing    Body parts bathed by patient: Right arm,Left arm,Abdomen,Front perineal area,Right upper leg,Left upper leg,Face   Body parts bathed by helper: Buttocks,Right lower leg,Left lower leg Body parts n/a: Chest   Bathing assist Assist Level: Moderate Assistance - Patient 50 - 74%     Upper Body Dressing/Undressing Upper body dressing   What is the patient wearing?: Button up shirt    Upper body assist Assist Level: Maximal Assistance - Patient 25 - 49%    Lower Body Dressing/Undressing Lower body dressing      What is the patient wearing?: Pants     Lower body assist Assist for lower body dressing: Moderate Assistance - Patient 50 - 74%     Toileting Toileting    Toileting assist Assist for toileting: Supervision/Verbal cueing     Transfers Chair/bed transfer  Transfers assist     Chair/bed transfer assist level: Supervision/Verbal cueing     Locomotion Ambulation   Ambulation assist      Assist level: Minimal Assistance - Patient > 75% Assistive device: Cane-quad Max distance: >215ft   Walk 10 feet activity   Assist     Assist level: Minimal Assistance - Patient > 75% Assistive device: Cane-quad   Walk 50 feet activity   Assist    Assist level: Minimal Assistance - Patient > 75% Assistive device: Cane-quad    Walk 150 feet activity   Assist Walk 150 feet activity did not occur: Safety/medical concerns (fatigue, weakness, decreased balance and endurance)  Assist level:  Minimal Assistance - Patient > 75% Assistive device: Cane-quad    Walk 10 feet on uneven surface  activity   Assist Walk 10 feet on uneven surfaces activity did not occur: Safety/medical concerns (fatigue, weakness, decreased balance and endurance)   Assist level: Minimal Assistance - Patient > 75% Assistive device: Cane-quad   Wheelchair     Assist Will patient use wheelchair at discharge?: Yes Type of Wheelchair: Manual    Wheelchair  assist level: Contact Guard/Touching assist Max wheelchair distance: 150    Wheelchair 50 feet with 2 turns activity    Assist        Assist Level: Contact Guard/Touching assist   Wheelchair 150 feet activity     Assist      Assist Level: Contact Guard/Touching assist    Medical Problem List and Plan: 1.  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  Antithrombotics: -DVT/anticoagulation:  Pharmaceutical: Heparin             -antiplatelet therapy: N/A 3. Pain Management: On tylenol every 4 hours-->decrease to tid to avoid liver toxicity.   Continue Oxycodone 10-15 mg every 4 hours prn.   Controlled with meds on 12/28 4. Mood: LCSW             -antipsychotic agents: N/a 5. Neuropsych: This patient is not fully capable of making decisions on her own behalf. 6. Skin/Wound Care: Routine pressure relief measures.  7. Fluids/Electrolytes/Nutrition:  Monitor I/Os.   Encourage fluid intake.  8.  Multiple sclerosis with peripheral neuropathy: Question cognitive impairment at baseline 9.  HTN: Monitor blood pressures twice daily.    Cozaar twice daily, decreased to 25 on 12/28.  Soft on 12/28 10.  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 11. Osteoporosis: Note prior authorization was submitted to [facility] for Forteo (patient has declined this in the past). 11.  Leukocytosis: Resolved 12. H/o colitis and chronic intermittent LLQ pain/drug-induced constipation:  Added Colace on 12/25   Improving 13.  Sleep disturbance: cont amitriptyline 10mg  HS 14.  Anemia of chronic disease  Hemoglobin 10.5 on 12/27, labs ordered for tomorrow  LOS: 7 days A FACE TO FACE EVALUATION WAS PERFORMED  Bharat Antillon 1/28 02/28/2020, 10:38 AM

## 2020-02-28 NOTE — Progress Notes (Signed)
Occupational Therapy Session Note  Patient Details  Name: Michele Bryan MRN: [id number] Date of Birth: [birth year]  Today's Date: 02/28/2020 OT Individual Time: 0814-4818 OT Individual Time Calculation (min): 60 min    Short Term Goals: Week 1:  OT Short Term Goal 1 (Week 1): Pt will complete UB dressing with min assist OT Short Term Goal 2 (Week 1): Pt will complete toilet transfer with min assist OT Short Term Goal 3 (Week 1): Pt will complete bathing CGA at sit > stand level  Skilled Therapeutic Interventions/Progress Updates:    Pt received standing at the sink washing her hands with her spouse by her side.  Pt reported that she already washed up and dressed with S from the NT. Asked pt to wait to do her self care for tomorrow's OT session for her grad day.  Pt used RW with close S to walk to bed, sit down, stand up,  And ambulate all the way to the day room.  Had her spouse join [place] for education as pt is going home in 2 days.  In the gym, tried to problem solve UE AROM she could do for her L shoulder but unfortunately all movement patterns were just too painful.  Worked on core strength by completing weight shifts L, R and forward in small ROM. Worked on wt shift to lift hip slightly with maintaining upright head vs lean.  Discussed safety with reaching and how easily the weight of the halo can throw a pt off balance. Pt agreed to ask her spouse for assist if the object she needs to reach for is too far for her to grab with her reacher.   Pt's spouse stated he plans to be by her side constantly.  With returning to the room, spouse ambulated close to pt providing close supervision.  Spouse wanted to be cleared to take her to the bathroom. In the room, he demonstrated the ability to walk her to bathroom with RW, pt sat down and stood from toilet with close S and then ambulated back to bed.  Spouse cleared on safety plan.  Pt also used quad cane but feels safest with the RW for longer  distances.  Pt in room with all needs met.   Therapy Documentation Precautions:  Precautions Precautions: Cervical,Fall Required Braces or Orthoses: Other Brace Cervical Brace: Other (comment) Other Brace: Halo Restrictions Weight Bearing Restrictions: No    Vital Signs: Therapy Vitals Temp: 97.8 F (36.6 C) Temp Source: Oral Pulse Rate: 83 Resp: 16 BP: 108/62 Patient Position (if appropriate): Lying Oxygen Therapy SpO2: 100 % O2 Device: Room Air Pain: Pain Assessment Pain Scale: 0-10 Pain Score: 5  Pain Type: Acute pain Pain Location: Back Pain Orientation: Mid;Upper Pain Descriptors / Indicators: Aching Pain Frequency: Constant Pain Onset: On-going Patients Stated Pain Goal: 3 Pain Intervention(s): Repositioned    Therapy/Group: Individual Therapy  [place] 02/28/2020, 8:27 AM

## 2020-02-28 NOTE — Discharge Instructions (Signed)
Inpatient Rehab Discharge Instructions  Michele Bryan Discharge date and time:  03/01/20  Activities/Precautions/ Functional Status: Activity: no lifting, driving, or strenuous exercise till cleared by MD Diet: regular diet Wound Care: 1--Keep neck wound clean and dry. Contact MD if you develop any problems with your incision/wound--redness, swelling, increase in pain, drainage or if you develop fever or chills.                        2. For Pin sites: daily cleanse around pins with normal saline then apply a thin layer of bacitracin.  Contact MD if you develop any problems with your incision/wound--redness, swelling, increase in pain, drainage or if you develop fever or chills.    Functional status:  ___ No restrictions     ___ Walk up steps independently _X__ 24/7 supervision/assistance   ___ Walk up steps with assistance ___ Intermittent supervision/assistance  ___ Bathe/dress independently ___ Walk with walker     __X_ Bathe/dress with assistance ___ Walk Independently    ___ Shower independently ___ Walk with assistance    ___ Shower with assistance _X__ No alcohol     ___ Return to work/school ________   Special Instructions:    COMMUNITY REFERRALS UPON DISCHARGE:    Home Health:   PT & OT                   Agency:[facility] Phone: [phone]  Medical Equipment/Items Ordered:[facility]                                                 Agency/Supplier:[facility]  [phone]

## 2020-02-28 NOTE — Progress Notes (Signed)
Physical Therapy Session Note  Patient Details  Name: Michele Bryan MRN: [id number] Date of Birth: 02-20-1941  Today's Date: 02/28/2020 PT Individual Time: 1000-1108 PT Individual Time Calculation (min): 68 min   Short Term Goals: Week 1:  PT Short Term Goal 1 (Week 1): pt will perform bed mobility with supervision PT Short Term Goal 2 (Week 1): pt will transfer bed<>chair with LRAD CGA PT Short Term Goal 3 (Week 1): Pt will ambulate 83ft with LRAD CGA  Skilled Therapeutic Interventions/Progress Updates:    pt received in bed and agreeable to therapy, husband present. Pt directed in supine>sit with HOB slightly elevated supervision. Pt directed in donning B shoes supervision seated EOB. Pt directed in Sit to stand from bed with quad cane supervision. Pt directed in gait training with quad cane, CGA with VC for gait pattern for improved step through pattern and attention to task which improved overall, when pt attempted to duel task with conversation noted worsening of gait and pt educated on this. Pt reported feeling very fatigued at start of session and 9/10 pain between scapulas with L>R pain. Pt requested to participate in sitting activity to decrease fatigue and pain levels, pt directed in seated BLE strengthening exercises 2# marching, LAQ, hip abduction and adduction 3x20 with VC for rest breaks and technique. Pt reported her pain was not worse but had not improved. Pt directed in x5 Sit to stand to quad cane supervision with VC for hand placement and technique with use of quad cane. Pt then directed in gait training with quad cane to return to room, 250' with extra time required for similar VC and CGA overall for safety. Pt directed in Stand pivot transfer to bedside, sit>supine supervision. Pt reported she was very fatigued at end of session and pain continued to bed 9/10. Pt's husband present and had multiple questions about DC recommendations, and had new personal quad cane present and  requested PT to set height, this was completed to pt's height and PT educated pt and family on technique to ambulating with cane for safety, pt's husband reports pt will not be alone until medically cleared for Halo to be removed and even then based on recommendations. All questions answered and pt and family report comfort with pt's current level of assist. Pt left in supine, alarm set, All needs in reach and in good condition. Call light in hand.  Husband present at bedside.   Therapy Documentation Precautions:  Precautions Precautions: Cervical,Fall Required Braces or Orthoses: Other Brace Cervical Brace: Other (comment) Other Brace: Halo Restrictions Weight Bearing Restrictions: No General:   Vital Signs:   Pain:   Mobility:   Locomotion :    Trunk/Postural Assessment :    Balance:   Exercises:   Other Treatments:      Therapy/Group: Individual Therapy  Barbaraann Faster 02/28/2020, 12:16 PM

## 2020-02-29 ENCOUNTER — Telehealth: Payer: Self-pay | Admitting: *Deleted

## 2020-02-29 ENCOUNTER — Inpatient Hospital Stay (HOSPITAL_COMMUNITY): Payer: Medicare PPO

## 2020-02-29 ENCOUNTER — Encounter (HOSPITAL_COMMUNITY): Payer: Medicare PPO | Admitting: Psychology

## 2020-02-29 DIAGNOSIS — G8918 Other acute postprocedural pain: Secondary | ICD-10-CM

## 2020-02-29 DIAGNOSIS — G35 Multiple sclerosis: Secondary | ICD-10-CM

## 2020-02-29 LAB — CBC WITH DIFFERENTIAL/PLATELET
Abs Immature Granulocytes: 0.05 10*3/uL (ref 0.00–0.07)
Basophils Absolute: 0.1 10*3/uL (ref 0.0–0.1)
Basophils Relative: 1 %
Eosinophils Absolute: 0 10*3/uL (ref 0.0–0.5)
Eosinophils Relative: 0 %
HCT: 33.9 % — ABNORMAL LOW (ref 36.0–46.0)
Hemoglobin: 10.5 g/dL — ABNORMAL LOW (ref 12.0–15.0)
Immature Granulocytes: 1 %
Lymphocytes Relative: 31 %
Lymphs Abs: 1.7 10*3/uL (ref 0.7–4.0)
MCH: 26.8 pg (ref 26.0–34.0)
MCHC: 31 g/dL (ref 30.0–36.0)
MCV: 86.5 fL (ref 80.0–100.0)
Monocytes Absolute: 0.7 10*3/uL (ref 0.1–1.0)
Monocytes Relative: 12 %
Neutro Abs: 3 10*3/uL (ref 1.7–7.7)
Neutrophils Relative %: 55 %
Platelets: 458 10*3/uL — ABNORMAL HIGH (ref 150–400)
RBC: 3.92 MIL/uL (ref 3.87–5.11)
RDW: 14 % (ref 11.5–15.5)
WBC: 5.4 10*3/uL (ref 4.0–10.5)
nRBC: 0 % (ref 0.0–0.2)

## 2020-02-29 MED ORDER — VITAMIN B-12 5000 MCG PO TBDP: 5000.0000 ug | ORAL_TABLET | Freq: Every day | ORAL | 0 refills | Status: AC

## 2020-02-29 MED ORDER — CYCLOBENZAPRINE HCL 10 MG PO TABS: 10.0000 mg | ORAL_TABLET | Freq: Three times a day (TID) | ORAL | 0 refills | Status: DC | PRN

## 2020-02-29 MED ORDER — GABAPENTIN 100 MG PO CAPS: 100.0000 mg | ORAL_CAPSULE | Freq: Three times a day (TID) | ORAL | 11 refills | Status: DC

## 2020-02-29 MED ORDER — DOCUSATE SODIUM 100 MG PO CAPS
100.0000 mg | ORAL_CAPSULE | Freq: Every day | ORAL | 0 refills | Status: DC
Start: 2020-02-29 — End: 2021-05-20

## 2020-02-29 MED ORDER — LIDOCAINE 5 % EX PTCH: 1.0000 | MEDICATED_PATCH | CUTANEOUS | 0 refills | Status: DC

## 2020-02-29 MED ORDER — AMITRIPTYLINE HCL 10 MG PO TABS
10.0000 mg | ORAL_TABLET | Freq: Every day | ORAL | 0 refills | Status: DC
Start: 2020-02-29 — End: 2020-03-08

## 2020-02-29 MED ORDER — OXYCODONE HCL 10 MG PO TABS: 10.0000 mg | ORAL_TABLET | ORAL | 0 refills | Status: DC | PRN

## 2020-02-29 MED ORDER — ACETAMINOPHEN 325 MG PO TABS: 325.0000 mg | ORAL_TABLET | ORAL | Status: DC | PRN

## 2020-02-29 MED ORDER — VITAMIN D 125 MCG (5000 UT) PO CAPS: 5000.0000 [IU] | ORAL_CAPSULE | Freq: Every day | ORAL | 0 refills | Status: DC

## 2020-02-29 MED ORDER — AMLODIPINE BESYLATE 5 MG PO TABS: 5.0000 mg | ORAL_TABLET | Freq: Every day | ORAL | 0 refills | Status: AC

## 2020-02-29 MED ORDER — LOSARTAN POTASSIUM 25 MG PO TABS: 25.0000 mg | ORAL_TABLET | Freq: Two times a day (BID) | ORAL | 0 refills | Status: DC

## 2020-02-29 MED ORDER — POLYETHYLENE GLYCOL 3350 17 G PO PACK
17.0000 g | PACK | Freq: Every day | ORAL | 0 refills | Status: DC
Start: 2020-02-29 — End: 2021-05-20

## 2020-02-29 MED ORDER — OMEPRAZOLE 40 MG PO CPDR: 40.0000 mg | DELAYED_RELEASE_CAPSULE | Freq: Every day | ORAL | 0 refills | Status: AC

## 2020-02-29 NOTE — Progress Notes (Signed)
[place] PHYSICAL MEDICINE & REHABILITATION PROGRESS NOTE   Subjective/Complaints: Patient seen sitting up in her chair, working with therapy this morning.  She states she slept fairly overnight, which is her baseline.  Family at bedside.  Discussed progress and family education with therapies.  ROS: Denies CP, SOB, N/V/D  Objective:   No results found. Recent Labs    02/27/20 0549 02/29/20 0542  WBC 6.8 5.4  HGB 10.5* 10.5*  HCT 34.0* 33.9*  PLT 458* 458*   Recent Labs    02/27/20 0549  NA 135  K 4.5  CL 98  CO2 26  GLUCOSE 101*  BUN 19  CREATININE 0.90  CALCIUM 9.8    Intake/Output Summary (Last 24 hours) at 02/29/2020 1000 Last data filed at 02/28/2020 1900 Gross per 24 hour  Intake 118 ml  Output --  Net 118 ml        Physical Exam: Vital Signs Blood pressure (!) 125/55, pulse 73, temperature 97.6 F (36.4 C), temperature source Oral, resp. rate 16, height 5\' 3"  (1.6 m), weight 47.7 kg, SpO2 100 %.  Constitutional: No distress . Vital signs reviewed. HENT: Normocephalic.  Atraumatic.  + Halo. Eyes: EOMI. No discharge. Cardiovascular: No JVD.  RRR. Respiratory: Normal effort.  No stridor.  Bilateral clear to auscultation. GI: Non-distended.  BS +. Skin: Warm and dry.  Neck incision with dried blood, healing. Psych: Normal mood.  Normal behavior. Musc: No edema in extremities.  No tenderness in extremities. Ulnar deviation of digits, unchanged Neuro: Alert Motor: B/l UE: 4-4+/5 proximal to distal B/l LE: HF, KE 4-4+/5, ADF 5/5, unchanged Sensation intact to light touch  Assessment/Plan: 1. Functional deficits which require 3+ hours per day of interdisciplinary therapy in a comprehensive inpatient rehab setting.  Physiatrist is providing close team supervision and 24 hour management of active medical problems listed below.  Physiatrist and rehab team continue to assess barriers to discharge/monitor patient progress toward functional and medical  goals  Care Tool:  Bathing    Body parts bathed by patient: Right arm,Left arm,Abdomen,Front perineal area,Right upper leg,Left upper leg,Face   Body parts bathed by helper: Buttocks,Right lower leg,Left lower leg Body parts n/a: Chest   Bathing assist Assist Level: Moderate Assistance - Patient 50 - 74%     Upper Body Dressing/Undressing Upper body dressing   What is the patient wearing?: Button up shirt    Upper body assist Assist Level: Maximal Assistance - Patient 25 - 49%    Lower Body Dressing/Undressing Lower body dressing      What is the patient wearing?: Pants     Lower body assist Assist for lower body dressing: Moderate Assistance - Patient 50 - 74%     Toileting Toileting    Toileting assist Assist for toileting: Supervision/Verbal cueing     Transfers Chair/bed transfer  Transfers assist     Chair/bed transfer assist level: Supervision/Verbal cueing     Locomotion Ambulation   Ambulation assist      Assist level: Contact Guard/Touching assist Assistive device: Cane-quad Max distance: 368ft   Walk 10 feet activity   Assist     Assist level: Contact Guard/Touching assist Assistive device: Cane-quad   Walk 50 feet activity   Assist    Assist level: Contact Guard/Touching assist Assistive device: Cane-quad    Walk 150 feet activity   Assist Walk 150 feet activity did not occur: Safety/medical concerns (fatigue, weakness, decreased balance and endurance)  Assist level: Contact Guard/Touching assist Assistive device: Cane-quad  Walk 10 feet on uneven surface  activity   Assist Walk 10 feet on uneven surfaces activity did not occur: Safety/medical concerns (fatigue, weakness, decreased balance and endurance)   Assist level: Contact Guard/Touching assist Assistive device: Cane-quad   Wheelchair     Assist Will patient use wheelchair at discharge?: No Type of Wheelchair: Manual    Wheelchair assist level:  Supervision/Verbal cueing Max wheelchair distance: 151ft    Wheelchair 50 feet with 2 turns activity    Assist        Assist Level: Supervision/Verbal cueing   Wheelchair 150 feet activity     Assist      Assist Level: Supervision/Verbal cueing    Medical Problem List and Plan: 1.  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 Please see conference note from today as well.  2.  Antithrombotics: -DVT/anticoagulation:  Pharmaceutical: Heparin             -antiplatelet therapy: N/A 3. Pain Management: On tylenol every 4 hours-->decrease to tid to avoid liver toxicity.   Continue Oxycodone 10-15 mg every 4 hours prn.   Controlled with meds on 12/29 4. Mood: LCSW             -antipsychotic agents: N/a 5. Neuropsych: This patient is not fully capable of making decisions on her own behalf. 6. Skin/Wound Care: Routine pressure relief measures.  7. Fluids/Electrolytes/Nutrition:  Monitor I/Os.   Encourage fluid intake.  8.  Multiple sclerosis with peripheral neuropathy: Question cognitive impairment at baseline 9.  HTN: Monitor blood pressures twice daily.    Cozaar twice daily, decreased to 25 on 12/28.  Orthostasis on 12/29-TEDs/abdominal binder as necessary 10. 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 11. Osteoporosis: Note prior authorization was submitted to [facility] for Forteo (patient has declined this in the past). 11. Leukocytosis: Resolved 12. H/o colitis and chronic intermittent LLQ pain/drug-induced constipation:  Added Colace on 12/25   Improving 13.  Sleep disturbance: cont amitriptyline  10mg  HS 14.  Anemia of chronic disease  Hemoglobin 10.5 on 12/29  LOS: 8 days A FACE TO FACE EVALUATION WAS PERFORMED  Latrel Szymczak 1/30 02/29/2020, 10:00 AM

## 2020-02-29 NOTE — Patient Care Conference (Signed)
Inpatient RehabilitationTeam Conference and Plan of Care Update Date: 02/29/2020   Time: 11:43 AM    Patient Name: Michele Bryan      Medical Record Number: [id number]  Date of Birth: 01-13-1941 Sex: Female         Room/Bed: 4W01C/4W01C-01 Payor Info: Payor: [employer] MEDICARE / Plan: [employer] MEDICARE CHOICE PPO / Product Type: *No Product type* /    Admit Date/Time:  02/21/2020  3:44 PM  Primary Diagnosis:  Cervical myelopathy [facility])  Hospital Problems: Principal Problem:   Cervical myelopathy (HCC) Active Problems:   MS (multiple sclerosis) (HCC)   Constipation   Anemia of chronic disease   Essential hypertension   Orthostatic hypotension   Benign essential HTN   Postoperative pain    Expected Discharge Date: Expected Discharge Date: 03/01/20  Team Members Present: Physician leading conference: Dr. Maryla Morrow Care Coodinator Present: Chana Bode, RN, BSN, CRRN;Becky Dupree, LCSW Nurse Present: Other (comment) Kathee Polite, RN) PT Present: Raechel Chute, PT OT Present: Jake Shark, OT PPS Coordinator present : Fae Pippin, [place], PT     Current Status/Progress Goal Weekly Team Focus  Bowel/Bladder             Swallow/Nutrition/ Hydration             ADL's   overall supervision, slight A with donning shirt due to Halo  Supervision overall  pt/family education, ADL retraining, balance, functional balance, endurance   Mobility   bed mobility supervision, transfers with RW CGA, gait 185ft with RW CGA, 8 steps min A  supervision, CGA for step  functional mobility/transfers, generalized strengthening, dynamic standing balance/coordination, ambulation, stair navigation, endurance.   Communication             Safety/Cognition/ Behavioral Observations            Pain             Skin               Discharge Planning:  Home with husband and son and daughter in-law assisting with her care. Family education has been completed and ready for discharge  tomorrow.   Team Discussion: MD adjusting BP medications. Halo on and stable.  Patient on target to meet rehab goals: yes, on target to meet supervision goals  *See Care Plan and progress notes for long and short-term goals.   Revisions to Treatment Plan:   Teaching Needs: Transfers, toileting, halo care, medications, etc.  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 and resulting mild weakness secondary to previous falls with impaired ADLs/mobility  Barriers to Discharge: Medical stability   Possible Resolutions to Barriers/Weekly Focus: Therapies, patient and family edu, optimize Bp meds   Continued Need for Acute Rehabilitation Level of Care: The patient requires daily medical management by a physician with specialized training in physical medicine and rehabilitation for the following reasons: Direction of a multidisciplinary physical rehabilitation program to maximize functional independence : Yes Medical management of patient stability for increased activity during participation in an intensive rehabilitation regime.: Yes Analysis of laboratory values and/or radiology reports with any subsequent need for medication adjustment and/or medical intervention. : Yes   I attest that I was present, lead the team conference, and concur with the assessment and plan of the team.   Chana Bode B 02/29/2020, 7:27  PM

## 2020-02-29 NOTE — Telephone Encounter (Signed)
Margaret Development worker, international aid called from [facility] to let us know that they will have a slight delay in start of care for Michele Bryan.  They should start Tuesday 03/06/20.

## 2020-02-29 NOTE — Progress Notes (Signed)
Patient ID: Michele Bryan, female   DOB: [birth year], 79 y.o.   MRN: [id number]  Met with pt and daughter in-law to inform team conference and have meet goals and ready for discharge tomorrow. Family education complete and pt and daughter in-law happy about this.

## 2020-02-29 NOTE — Progress Notes (Signed)
Physical Therapy Session Note  Patient Details  Name: Michele Bryan MRN: [id number] Date of Birth: 1940/08/18  Today's Date: 02/29/2020 PT Individual Time: [phone] and [phone] PT Individual Time Calculation (min): 54 min and 69 min  Short Term Goals: Week 1:  PT Short Term Goal 1 (Week 1): pt will perform bed mobility with supervision PT Short Term Goal 2 (Week 1): pt will transfer bed<>chair with LRAD CGA PT Short Term Goal 3 (Week 1): Pt will ambulate 81ft with LRAD CGA  Skilled Therapeutic Interventions/Progress Updates:   Treatment Session 1: [id number] 54 min Received pt sitting in bed with RN present administering medications, pt agreeable to therapy, and reported pain 7/10 in bilateral shoulders and underneath R scapula. Repositioning and rest breaks done to reduce pain levels. Session with emphasis on discharge planning, functional mobility/transfers, generalized strengthening, dynamic standing balance/coordination, ambulation, simulated car transfers, stair navigation, and improved activity tolerance. Pt transferred bed<>WC with quad cane and close supervision with cues for AD management. MD present for morning rounds. Pt stated her husband bought her a small based quad cane yesterday and will not need [place] to order one. Pt also reported she has a bedrail to use on her bed at home. Pt performed WC mobility alternating using BLEs and RUE, due to L shoulder pain and decreased ROM, with supervision and mod cues for technique. Pt with difficulty coordinating propelling WC with LEs. Pt transported remainder of way to ortho gym in [facility] total A and ambulated 78ft on uneven surfaces (ramp and mulch) with quad cane and CGA and navigated 1 curb with quad cane and min A with cues to get to edge of step prior to stepping down. Pt performed simulated car transfer with quad cane and CGA with cues for turning technique. Pt able to stand and pick up small cup from floor with quad cane and heavy CGA. Pt  transported to therapy gym and navigated 12 steps with 2 rails ascending and descending with a step through pattern with CGA overall. Pt then ambulated 361ft with quad cane and CGA with 1 instance of LOB due to distractions, requiring min A to correct. Pt transported back to room in [facility] total A. Concluded session with pt sitting in [facility] with daughter in law attending to care.   Treatment Session 2: [id number] 69 min Received pt sitting in [facility] with daughter in law present at bedside, pt agreeable to therapy, and reported pain 7/10 in between shoulder blades but requested to wait until end of session for pain medication. Repositioning, rest breaks, and distraction done to reduce pain levels. Session with emphasis on functional mobility/transfers, generalized strengthening, dynamic standing balance/coordination, ambulation, and improved activity tolerance. Pt transferred sit<>stand with quad cane and supervision and ambulated >342ft with quad cane and CGA to ortho gym with 1 LOB due to turning too quickly requiring min A to correct. Educated pt on importance of minimizing distractions and focusing on walking for safety. Pt performed BUE/LE strengthening on Nustep at workload 5 for 10 minutes for a total of 520 steps for improved cardiovascular endurance. Engaged in dynamic standing balance performing the following activities on BITS with emphasis on problem solving, fine motor control, dynamic reaching, visual scanning, and memory with CGA for balance: -maze standing for 1 minute 32 seconds with 7 errors due to running out of bounds with finger demonstrating deficits in fine motor control. -memory testing with images with 62.5% accuracy. Pt with difficulty remembering >4 words in order. -visual scanning & reaction x  2 trials. Trial 1: 30 hit and Trial 2: 31 hits on level 1; pt unable to progress to level 2 -replicating images x 4 trials. Pt demonstrated good spatial awareness and perception.  Pt transported to  dayroom in [facility] total A and performed seated BLE strengthening on Kinetron at 20cm/sec for 1 minute x 4 trials with BUE support with emphasis on quad and glute strengthening. Pt ambulated additional 168ft with quad cane and CGA back to room. Concluded session with pt sitting EOB with daughter in law present attending to care.   Therapy Documentation Precautions:  Precautions Precautions: Cervical,Fall Required Braces or Orthoses: Other Brace (Halo) Cervical Brace: Other (comment) (Halo) Other Brace: Halo Restrictions Weight Bearing Restrictions: No   Therapy/Group: Individual Therapy Martin Majestic PT, DPT   02/29/2020, 7:39 AM

## 2020-02-29 NOTE — Progress Notes (Signed)
Occupational Therapy Discharge Summary  Patient Details  Name: Michele Bryan MRN: [id number] Date of Birth: 01-05-1941  Today's Date: 02/29/2020 OT Individual Time: 0945-1100 OT Individual Time Calculation (min): 75 min    Patient has met 11 of 12 long term goals due to improved activity tolerance, improved balance, postural control, ability to compensate for deficits, improved awareness and improved coordination.  Patient to discharge at overall Supervision level.  Patient's care partner is independent to provide the necessary physical assistance at discharge.  Family education has been completed.   Reasons goals not met: D/t pt's halo she requires min A for UB dressing. Family is able to provide this care.   Recommendation:  Patient will benefit from ongoing skilled OT services in home health setting to continue to advance functional skills in the area of BADL.  Equipment: No equipment provided  Reasons for discharge: treatment goals met and discharge from hospital  Patient/family agrees with progress made and goals achieved: Yes   Skilled OT Intervention: Pt received sitting in w/c with her DIL present. Pt requested assistance with brushing her hair around halo and this was provided. Throughout session discussed d/c planning, home set up/accessibility, CLOF, and use of AE. Pt was taken via w/c to the ADL apt where discussion was initiated re adaptive utensils, cutting board, etc, for current and future arthritic hand changes. Demo re functional mobility in the kitchen provided. Pt returned demo, practicing with both RW and quad cane with min cueing overall. Pt practiced simulated cooking tasks with min cueing for safety. Pt returned to her room and completed toileting tasks with supervision overall. Pt was left sitting up with all needs met.   OT Discharge Precautions/Restrictions  Precautions Precautions: Cervical;Fall Required Braces or Orthoses: Other Brace (Halo\) Cervical  Brace: Other (comment) Other Brace: Halo Restrictions Weight Bearing Restrictions: No   Pain Pain Assessment Pain Scale: 0-10 Pain Score: 4  Pain Type: Acute pain Pain Location: Shoulder Pain Orientation: Left;Right;Upper Pain Descriptors / Indicators: Aching Pain Frequency: Constant Pain Onset: On-going Patients Stated Pain Goal: 3 Pain Intervention(s): Repositioned ADL ADL Upper Body Bathing: Minimal assistance Where Assessed-Upper Body Bathing: Chair Lower Body Bathing: Minimal assistance Where Assessed-Lower Body Bathing: Chair Upper Body Dressing: Maximal assistance Where Assessed-Upper Body Dressing: Chair Lower Body Dressing: Minimal assistance Where Assessed-Lower Body Dressing: Chair Toileting: Minimal assistance Where Assessed-Toileting: Bedside Commode Toilet Transfer: Moderate assistance Toilet Transfer Method: Stand pivot Toilet Transfer Equipment: Bedside commode Vision Baseline Vision/History: Wears glasses Wears Glasses: Reading only Patient Visual Report: No change from baseline Vision Assessment?: No apparent visual deficits Perception  Perception: Within Functional Limits Praxis Praxis: Intact Cognition Overall Cognitive Status: Within Functional Limits for tasks assessed Arousal/Alertness: Awake/alert Orientation Level: Oriented X4 Attention: Selective;Alternating Selective Attention: Appears intact Alternating Attention: Appears intact Memory: Impaired Awareness: Appears intact Problem Solving: Appears intact Safety/Judgment: Appears intact Sensation Sensation Light Touch: Appears Intact Proprioception: Appears Intact Coordination Gross Motor Movements are Fluid and Coordinated: No Fine Motor Movements are Fluid and Coordinated: Yes Coordination and Movement Description: grossly uncoordinated due to weight of Halo, generalized weakness, and decreased standing balance/coordination Finger Nose Finger Test: WFL bilaterally, decreased  shoulder ROM on LUE Heel Shin Test: [facility] bilaterally Motor  Motor Motor: Abnormal postural alignment and control Motor - Skilled Clinical Observations: grossly uncoordinated due to weight of Halo, decreased balance/postural control, and generalized weakness Motor - Discharge Observations: grossly uncoordinated due to weight of Halo, decreased balance/postural control, and generalized weakness Mobility  Bed Mobility Bed Mobility: Rolling  Right;Rolling Left;Sit to Supine;Supine to Sit Rolling Right: Independent with assistive device Rolling Left: Independent with assistive device Supine to Sit: Independent with assistive device Sit to Supine: Independent with assistive device Transfers Sit to Stand: Supervision/Verbal cueing Stand to Sit: Supervision/Verbal cueing  Trunk/Postural Assessment  Cervical Assessment Cervical Assessment: Exceptions to [facility] (cervical precautions) Thoracic Assessment Thoracic Assessment: Exceptions to [facility] (rounded shoulders d/t weight of halo) Lumbar Assessment Lumbar Assessment: Within Functional Limits Postural Control Postural Control: Deficits on evaluation (delayed)  Balance Balance Balance Assessed: Yes Static Sitting Balance Static Sitting - Balance Support: Feet supported;Bilateral upper extremity supported Static Sitting - Level of Assistance: 6: Modified independent (Device/Increase time) Dynamic Sitting Balance Dynamic Sitting - Balance Support: Feet supported;No upper extremity supported Dynamic Sitting - Level of Assistance: 5: Stand by assistance Static Standing Balance Static Standing - Balance Support: Right upper extremity supported Static Standing - Level of Assistance: 5: Stand by assistance Dynamic Standing Balance Dynamic Standing - Balance Support: Right upper extremity supported Dynamic Standing - Level of Assistance: 5: Stand by assistance Extremity/Trunk Assessment RUE Assessment RUE Assessment: Within Functional  Limits Passive Range of Motion (PROM) Comments: WFL,Ulnar deviations of fingers due to RA General Strength Comments: grossly 4/5 LUE Assessment LUE Assessment: Exceptions to [facility] Passive Range of Motion (PROM) Comments: WFL, pain limiting overhead reaching but still WFL,Ulnar deviations of fingers due to RA Active Range of Motion (AROM) Comments: 65 degrees of shoulder flexion, bicep WFL, ulnar deviations of fingers   Curtis Sites 02/29/2020, 10:14 AM

## 2020-02-29 NOTE — Consult Note (Signed)
Neuropsychological Consultation   Patient:   Michele Bryan   DOB:   Jul 25, 1940  MR Number:  [id number]  Location:  [facility] [address] CENTER A [address] [id number] [place] [address] Dept: [phone] Loc: [phone]           Date of Service:   02/29/2020  Start Time:   1 PM End Time:   2 PM  Provider/Observer:  Arley Phenix, Psy.D.       Clinical Neuropsychologist       Billing Code/Service: 42876  Chief Complaint:    KAYL ERIKSEN is a 79 year old female with history of hypertension, MS for many years with last flare 3 years ago, RA, anemia, fall 03/2018 with neck pain and found to have C1/C2 fracture with displacement requiring fusion 12/06/2019.  The patient developed recurrent pain with dysphagia and was found to have hardware failure with loosening of C2 screws and chin on chest deformity.  Patient admitted on 02/13/2020 for revision by Dr. Johnsie Cancel.  Patient was doing well postoperatively but on 12/15 developed increased neck pain with worsening posture.  CT neck done revealing fracture of C5 Fossett and concerns for hardware pullout.  Patient taken back to the OR for revision with extension of fusion to T2 with attempted open reduction of C1/C2 and placed in halo for support.  Initially postoperatively the patient had issues with hallucinations with headaches and shoulder pain.  Patient does have residual findings of MS involvement primarily in white matter regions found on her MRI in the past.  Patient has done well during CIR and it showed significant improvements in cognition.  While there may still be some issues with information processing speed the patient is doing well and is not having a flare of her MS.  Patient has had some confusion and difficulties with reasoning and problem-solving and continues to have some level of confusion as to the reason for CIR stay.  Reason for Service:  Patient was  referred for neuropsychological evaluation due to initial issues of confusion and hallucination postoperatively.  The symptoms have cleared and the patient is returning to baseline cognitively.  Below is the HPI for the current admission.  HPI: Michele Semon. Erazo is a 79 year old female with history of HTN, MS--no medications/last flare 3 years (Dr. Terrace Arabia), RA, anemia, fall 03/2018 with neck pain and found to have C1/C2 fracture with displacement requiring fusion 12/06/19. She developed recurrent pian with dysphagia and was found to have hardware failure with loosening of C2 screws and chin on chest deformity. She was admitted on  02/13/20 for revision with occiput to C5 posterior fusion by Dr. Maurice Small.  Post op was doing well but on 12/15 develop increase neck pain with worsened posture. CT neck done revealing fracture of C5 facet with concerns for hardware pull out. She was taken back to OR for revision with extension of fusion from occiput to T2 with attempted open reducation of C1/C2 and placed in Halo for support. Post op, she has had issues with hallucinations with headaches and shoulder pain.      She continuesl requiring IV Dilaudid with prn oxycodone. Tylenol added every 4 hours with lidocaine for local measures.  She has had issues with confusion, difficulty problem-solving and requiring cues to maintain precautions.  Therapy has been ongoing and patient continues to have limitations due to pain, weakness with posterior bias when standing or mobile, has cognitive deficits with poor safety awareness  and needs increased time to follow simple one-step commands.  CIR was recommended due to functional decline.  Current Status:  Upon entering the room, the patient quickly attempted to sit up in her bed and quickly engaged in conversation.  Her daughter-in-law was present in the room as well.  The patient's mood and affect were quite bright and she was engaging.  The patient was oriented with good mental  status and good cognition.  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  Remote memory appears to be intact.  The patient had questions regarding the reason for an extent of her rehab efforts, which have been addressed prior but the patient asked these questions again.  Patient denied any significant negative mood states and denied any significant depression or anxiety type symptoms currently.  Patient was able to remember names of many of the therapist that had worked with her during her CIR stay and to accurately describe some of the PT/OT therapeutic interventions that had been worked on.  Behavioral Observation: NIKKOL PAI  presents as a 79 y.o.-year-old Right Caucasian Female who appeared her stated age. her dress was Appropriate and she was Well Groomed and her manners were Appropriate to the situation.  her participation was indicative of Appropriate and Attentive behaviors.  There were physical disabilities noted.  she displayed an appropriate level of cooperation and motivation.     Interactions:    Active Appropriate and Attentive  Attention:   within normal limits and attention span and concentration were age appropriate  Memory:   within normal limits; recent and remote memory intact  Visuo-spatial:  not examined  Speech (Volume):  normal  Speech:   normal; normal  Thought Process:  Coherent and Relevant  Though Content:  WNL; not suicidal and not homicidal  Orientation:   person, place, time/date and situation  Judgment:   Fair  Planning:   Fair  Affect:    Appropriate  Mood:    Euthymic  Insight:   Good  Intelligence:   high  Medical History:   Past Medical History:  Diagnosis Date  . Anemia   . Arthritis   . GERD (gastroesophageal reflux disease)   . History of blood transfusion   . Hyperlipidemia   . Hypertension   . MS  (multiple sclerosis) (HCC)   . Neuromuscular disorder (HCC)    multiple scleroosis/peripheral neuropathy  . PONV (postoperative nausea and vomiting)   . Rheumatoid arthritis (HCC)   . Ulcer   . Vitamin D deficiency          Patient Active Problem List   Diagnosis Date Noted  . Postoperative pain   . Benign essential HTN   . Constipation   . Anemia of chronic disease   . Essential hypertension   . Orthostatic hypotension   . Cervical myelopathy (HCC) 02/21/2020  . Closed cervical spine fracture (HCC) 12/06/2019  . Scapula fracture 03/05/2018  . Chronic left shoulder pain 11/25/2017  . Left cervical radiculopathy 09/29/2017  . 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 High risk medication use 05/06/2016  . Primary osteoarthritis of both hands 05/06/2016  . Primary osteoarthritis of left knee 05/06/2016  . Primary osteoarthritis of both feet 05/06/2016  . Dyslipidemia 05/06/2016  . Peripheral neuropathy 05/06/2016  .  Diverticulosis of intestine without bleeding 05/06/2016  . Osteopenia  05/06/2016  . Vitamin D deficiency 05/06/2016  . 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 History of GI bleed 05/06/2016  . Cataract of both eyes 05/06/2016  . Urinary urgency 04/30/2015  . Chronic constipation 04/30/2015  . MS (multiple sclerosis) (HCC) 03/23/2013  . Expected blood loss anemia 06/16/2012  . S/P right TKA 06/15/2012    Psychiatric History:  No prior psychiatric history but does have a significant prior neurological history related to MS.  Family Med/Psych History:  Family History  Problem Relation Age of Onset  . Heart attack Father   . Breast cancer Neg Hx     Impression/DX:  LYRIK DOCKSTADER is a 79 year old female with history of hypertension, MS for many years with last flare 3 years ago, RA, anemia, fall 03/2018 with neck pain and found to have  C1/C2 fracture with displacement requiring fusion 12/06/2019.  The patient developed recurrent pain with dysphagia and was found to have hardware failure with loosening of C2 screws and chin on chest deformity.  Patient admitted on 02/13/2020 for revision by Dr. Johnsie Cancel.  Patient was doing well postoperatively but on 12/15 developed increased neck pain with worsening posture.  CT neck done revealing fracture of C5 Fossett and concerns for hardware pullout.  Patient taken back to the OR for revision with extension of fusion to T2 with attempted open reduction of C1/C2 and placed in halo for support.  Initially postoperatively the patient had issues with hallucinations with headaches and shoulder pain.  Patient does have residual findings of MS involvement primarily in white matter regions found on her MRI in the past.  Patient has done well during CIR and it showed significant improvements in cognition.  While there may still be some issues with information processing speed the patient is doing well and is not having a flare of her MS.  Patient has had some confusion and difficulties with reasoning and problem-solving and continues to have some level of confusion as to the reason for CIR stay.  Upon entering the room, the patient quickly attempted to sit up in her bed and quickly engaged in conversation.  Her daughter-in-law was present in the room as well.  The patient's mood and affect were quite bright and she was engaging.  The patient was oriented with good mental status and good cognition.  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  Remote memory appears to be intact.  The patient had questions regarding the reason for an extent of her rehab efforts, which have been addressed prior but the patient asked these questions again.  Patient denied any significant negative mood  states and denied any significant depression or anxiety type symptoms currently.  Patient was able to remember names of many of the therapist that had worked with her during her CIR stay and to accurately describe some of the PT/OT therapeutic interventions that had been worked on.  Disposition/Plan:  Patient is scheduled to be discharged tomorrow.  At this time I do not see a need to follow-up outpatient but did inform the patient if she were to struggle with ongoing issues of pain and adjustment following the surgeries and CIR that I would be available on an outpatient basis if needed.  Diagnosis:    Cervical myelopathy (HCC) - Plan: Ambulatory referral to Physical  Medicine Rehab  Constipation         Electronically Signed   _______________________ Arley Phenix, Psy.D. Clinical Neuropsychologist

## 2020-02-29 NOTE — Progress Notes (Signed)
Physical Therapy Discharge Summary  Patient Details  Name: Michele Bryan MRN: [id number] Date of Birth: 01/17/1941  Patient has met 8 of 8 long term goals due to improved activity tolerance, improved balance, improved postural control, increased strength, decreased pain, improved awareness and improved coordination. Patient to discharge at an ambulatory level CGA.  Patient's care partner is independent to provide the necessary physical assistance at discharge. Pt's son, Leonor Liv, attended family education training on 12/27 and verbalized and demonstrated confidence with all tasks to ensure safe discharge home. Pt's son and daughter in law were both cleared to assist with transfers during admission and will both be available to provide 24/7 assist at home.   All goals met   Recommendation:  Patient will benefit from ongoing skilled PT services in home health setting to continue to advance safe functional mobility, address ongoing impairments in transfers, generalized strengthening, dynamic standing balance/coordination, ambulation, endurance, and to minimize fall risk.  Equipment: No equipment provided, pt's husband bought small quad cane  Reasons for discharge: treatment goals met  Patient/family agrees with progress made and goals achieved: Yes  PT Discharge Precautions/Restrictions Precautions Precautions: Cervical;Fall Required Braces or Orthoses: Other Brace (Halo) Cervical Brace: Other (comment) (Halo) Other Brace: Halo Restrictions Weight Bearing Restrictions: No Cognition Overall Cognitive Status: Within Functional Limits for tasks assessed Arousal/Alertness: Awake/alert Orientation Level: Oriented X4 Memory: Impaired Awareness: Appears intact Problem Solving: Appears intact Safety/Judgment: Appears intact Sensation Sensation Light Touch: Appears Intact Proprioception: Appears Intact Coordination Gross Motor Movements are Fluid and Coordinated: No Fine Motor Movements are  Fluid and Coordinated: Yes Coordination and Movement Description: grossly uncoordinated due to weight of Halo, generalized weakness, and decreased standing balance/coordination Finger Nose Finger Test: WFL bilaterally, decreased shoulder ROM on LUE Heel Shin Test: [facility] bilaterally Motor  Motor Motor: Abnormal postural alignment and control Motor - Skilled Clinical Observations: grossly uncoordinated due to weight of Halo, decreased balance/postural control, and generalized weakness  Mobility Bed Mobility Bed Mobility: Rolling Right;Rolling Left;Sit to Supine;Supine to Sit Rolling Right: Independent with assistive device Rolling Left: Independent with assistive device Supine to Sit: Independent with assistive device Sit to Supine: Independent with assistive device Transfers Transfers: Sit to Stand;Stand to Sit;Stand Pivot Transfers Sit to Stand: Supervision/Verbal cueing Stand to Sit: Supervision/Verbal cueing Stand Pivot Transfers: Supervision/Verbal cueing Stand Pivot Transfer Details: Verbal cues for technique;Verbal cues for precautions/safety;Verbal cues for safe use of DME/AE Stand Pivot Transfer Details (indicate cue type and reason): verbal cues for turning technique and AD management Transfer (Assistive device): Small based quad cane Locomotion  Gait Ambulation: Yes Gait Assistance: Contact Guard/Touching assist Gait Distance (Feet): 343 Feet Assistive device: Small based quad cane Gait Assistance Details: Verbal cues for gait pattern;Verbal cues for sequencing;Verbal cues for technique;Verbal cues for safe use of DME/AE Gait Assistance Details: verbal cues for gait pattern with quad cane, step length, eneryg conservation, and to avoid distractiond Gait Gait: Yes Gait Pattern: Impaired Gait Pattern: Step-to pattern;Decreased trunk rotation;Decreased stride length;Decreased step length - right;Decreased step length - left;Poor foot clearance - left;Poor foot clearance -  right;Narrow base of support;Trunk flexed Gait velocity: Decreased Stairs / Additional Locomotion Stairs: Yes Stairs Assistance: Contact Guard/Touching assist Stair Management Technique: Two rails Number of Stairs: 12 Height of Stairs: 6 Ramp: Contact Guard/touching assist (quad cane) Curb: Minimal Assistance - Patient >75% (quad cane) Wheelchair Mobility Wheelchair Mobility: Yes Wheelchair Assistance: Chartered loss adjuster: Both lower extermities;Right upper extremity Wheelchair Parts Management: Needs assistance Distance: 176f  Trunk/Postural Assessment  Cervical  Assessment Cervical Assessment: Exceptions to [facility] (forward head due to weight of Halo) Thoracic Assessment Thoracic Assessment: Exceptions to [facility] (rounded shoulders due to weight of Halo) Lumbar Assessment Lumbar Assessment: Within Functional Limits Postural Control Postural Control: Deficits on evaluation  Balance Balance Balance Assessed: Yes Static Sitting Balance Static Sitting - Balance Support: Feet supported;Bilateral upper extremity supported Static Sitting - Level of Assistance: 6: Modified independent (Device/Increase time) Dynamic Sitting Balance Dynamic Sitting - Balance Support: Feet supported;No upper extremity supported Dynamic Sitting - Level of Assistance: 5: Stand by assistance (supervision) Static Standing Balance Static Standing - Balance Support: Right upper extremity supported (quad cane) Static Standing - Level of Assistance: 5: Stand by assistance (supervision) Dynamic Standing Balance Dynamic Standing - Balance Support: Right upper extremity supported (quad cane) Dynamic Standing - Level of Assistance: 5: Stand by assistance (supervision) Extremity Assessment  RLE Assessment RLE Assessment: Exceptions to [facility] General Strength Comments: grossly generalized to 4/5 LLE Assessment LLE Assessment: Exceptions to [facility] General Strength Comments: grossly generalized to  4/5  Alfonse Alpers PT, DPT  02/29/2020, 7:34 AM

## 2020-03-01 NOTE — Progress Notes (Signed)
Patient discharged off of unit with all belongings. Discharge papers/instructions explained by physician assistant to family. Patient and family have no further questions at time of discharge. No complications noted at this time.  Michele Bryan  

## 2020-03-01 NOTE — Progress Notes (Signed)
[facility] PHYSICAL MEDICINE & REHABILITATION PROGRESS NOTE   Subjective/Complaints: Patient seen laying in bed this AM.  Son at bedside.  She states she slept her usual overnight.  Son with questions regarding medications, particularly sleep medications-discussed with son.  Patient is eager for discharge.  She is appreciative of her care.  She was seen by neuropsych yesterday.  ROS: Denies CP, SOB, N/V/D  Objective:   No results found. Recent Labs    02/29/20 0542  WBC 5.4  HGB 10.5*  HCT 33.9*  PLT 458*   No results for input(s): NA, K, CL, CO2, GLUCOSE, BUN, CREATININE, CALCIUM in the last 72 hours.  Intake/Output Summary (Last 24 hours) at 03/01/2020 1040 Last data filed at 03/01/2020 0717 Gross per 24 hour  Intake 480 ml  Output --  Net 480 ml        Physical Exam: Vital Signs Blood pressure (!) 113/49, pulse 75, temperature 97.8 F (36.6 C), resp. rate 16, height 5\' 3"  (1.6 m), weight 47.7 kg, SpO2 99 %.  Constitutional: No distress . Vital signs reviewed. HENT: Normocephalic.  + Halo. Eyes: EOMI. No discharge. Cardiovascular: No JVD.  RRR. Respiratory: Normal effort.  No stridor.  Bilateral clear to auscultation. GI: Non-distended.  BS +. Skin: Warm and dry.  Neck incision healing. Psych: Normal mood.  Normal behavior. Musc: No edema in extremities.  No tenderness in extremities. Ulnar deviation of digits Intrinsic hand atrophy Neuro: Alert Motor: B/l UE: 4-4+/5 proximal to distal, improving B/l LE: HF, KE 4-4+/5, ADF 5/5, unchanged Sensation intact to light touch  Assessment/Plan: 1. Functional deficits which require 3+ hours per day of interdisciplinary therapy in a comprehensive inpatient rehab setting.  Physiatrist is providing close team supervision and 24 hour management of active medical problems listed below.  Physiatrist and rehab team continue to assess barriers to discharge/monitor patient progress toward functional and medical goals  Care  Tool:  Bathing    Body parts bathed by patient: Right arm,Left arm,Abdomen,Front perineal area,Right upper leg,Left upper leg,Face,Chest,Right lower leg,Left lower leg,Buttocks   Body parts bathed by helper: Buttocks,Right lower leg,Left lower leg Body parts n/a: Chest   Bathing assist Assist Level: Supervision/Verbal cueing     Upper Body Dressing/Undressing Upper body dressing   What is the patient wearing?: Button up shirt    Upper body assist Assist Level: Minimal Assistance - Patient > 75%    Lower Body Dressing/Undressing Lower body dressing      What is the patient wearing?: Pants     Lower body assist Assist for lower body dressing: Supervision/Verbal cueing     Toileting Toileting    Toileting assist Assist for toileting: Supervision/Verbal cueing     Transfers Chair/bed transfer  Transfers assist     Chair/bed transfer assist level: Supervision/Verbal cueing     Locomotion Ambulation   Ambulation assist      Assist level: Contact Guard/Touching assist Assistive device: Cane-quad Max distance: 393ft   Walk 10 feet activity   Assist     Assist level: Contact Guard/Touching assist Assistive device: Cane-quad   Walk 50 feet activity   Assist    Assist level: Contact Guard/Touching assist Assistive device: Cane-quad    Walk 150 feet activity   Assist Walk 150 feet activity did not occur: Safety/medical concerns (fatigue, weakness, decreased balance and endurance)  Assist level: Contact Guard/Touching assist Assistive device: Cane-quad    Walk 10 feet on uneven surface  activity   Assist Walk 10 feet on uneven  surfaces activity did not occur: Safety/medical concerns (fatigue, weakness, decreased balance and endurance)   Assist level: Contact Guard/Touching assist Assistive device: Cane-quad   Wheelchair     Assist Will patient use wheelchair at discharge?: No Type of Wheelchair: Manual    Wheelchair assist level:  Supervision/Verbal cueing Max wheelchair distance: 183ft    Wheelchair 50 feet with 2 turns activity    Assist        Assist Level: Supervision/Verbal cueing   Wheelchair 150 feet activity     Assist      Assist Level: Supervision/Verbal cueing    Medical Problem List and Plan: 1.  Unstable C1/2 fx with deformity s/p HALO and C1-C5 posterior fusion and resulting mild weakness secondary to previous falls with impaired ADLs/mobility  DC today  Will see patient for hospital follow-up in 1 month post-discharge 2.  Antithrombotics: -DVT/anticoagulation:  Pharmaceutical: Heparin             -antiplatelet therapy: N/A 3. Pain Management: On tylenol every 4 hours-->decrease to tid to avoid liver toxicity.   Continue Oxycodone 10-15 mg every 4 hours prn.   Controlled with meds on 12/30 4. Mood: LCSW             -antipsychotic agents: N/a 5. Neuropsych: This patient is not fully capable of making decisions on her own behalf.  Discussed with neuropsych, appreciate consult. 6. Skin/Wound Care: Routine pressure relief measures.  7. Fluids/Electrolytes/Nutrition:  Monitor I/Os.   Encourage fluid intake.  8.  Multiple sclerosis with peripheral neuropathy: Question cognitive impairment at baseline 9.  HTN: Monitor blood pressures twice daily.    Cozaar twice daily, decreased to 25 on 12/28.  Orthostasis on 12/30 -TEDs/abdominal binder as necessary 10. 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 11. Osteoporosis: Note prior authorization was submitted to [facility] for Forteo (patient has declined this in the past). 11. Leukocytosis: Resolved 12. H/o colitis and chronic intermittent LLQ pain/drug-induced constipation:  Added Colace on 12/25   Improving 13.  Sleep disturbance: cont amitriptyline 10mg  HS 14.  Anemia of chronic disease  Hemoglobin 10.5 on 12/29  > 30 minutes spent in total in discharge planning between myself and PA regarding  aforementioned, as well discussion regarding DME equipment, follow-up appointments, follow-up therapies, discharge medications, discharge recommendations  LOS: 9 days A FACE TO FACE EVALUATION WAS PERFORMED  Frankey Botting 1/30 03/01/2020, 10:40 AM

## 2020-03-01 NOTE — Plan of Care (Signed)
  Problem: Consults Goal: RH GENERAL PATIENT EDUCATION Description: See Patient Education module for education specifics. Outcome: Completed/Met Goal: Skin Care Protocol Initiated - if Braden Score 18 or less Description: If consults are not indicated, leave blank or document N/A Outcome: Completed/Met Goal: Nutrition Consult-if indicated Outcome: Completed/Met Goal: Diabetes Guidelines if Diabetic/Glucose > 140 Description: If diabetic or lab glucose is > 140 mg/dl - Initiate Diabetes/Hyperglycemia Guidelines & Document Interventions  Outcome: Completed/Met   Problem: RH BOWEL ELIMINATION Goal: RH STG MANAGE BOWEL WITH ASSISTANCE Description: STG Manage Bowel with  mod I Assistance. Outcome: Completed/Met Goal: RH STG MANAGE BOWEL W/MEDICATION W/ASSISTANCE Description: STG Manage Bowel with Medication with Assistance. Outcome: Completed/Met   Problem: RH BLADDER ELIMINATION Goal: RH STG MANAGE BLADDER WITH ASSISTANCE Description: STG Manage Bladder With min Assistance Outcome: Completed/Met Goal: RH STG MANAGE BLADDER WITH EQUIPMENT WITH ASSISTANCE Description: STG Manage Bladder With Equipment With  min Assistance Outcome: Completed/Met   Problem: RH SKIN INTEGRITY Goal: RH STG SKIN FREE OF INFECTION/BREAKDOWN Description: With min assist Outcome: Completed/Met Goal: RH STG MAINTAIN SKIN INTEGRITY WITH ASSISTANCE Description: STG Maintain Skin Integrity With min Assistance. Outcome: Completed/Met Goal: RH STG ABLE TO PERFORM INCISION/WOUND CARE W/ASSISTANCE Description: STG Able To Perform Incision/Wound Care With  min Assistance. Outcome: Completed/Met   Problem: RH SAFETY Goal: RH STG ADHERE TO SAFETY PRECAUTIONS W/ASSISTANCE/DEVICE Description: STG Adhere to Safety Precautions With  cues/reminders Assistance/Device. Outcome: Completed/Met Goal: RH STG DECREASED RISK OF FALL WITH ASSISTANCE Description: STG Decreased Risk of Fall With cues/reminders   Assistance. Outcome: Completed/Met   Problem: RH PAIN MANAGEMENT Goal: RH STG PAIN MANAGED AT OR BELOW PT'S PAIN GOAL Description: At or below level 4 Outcome: Completed/Met   Problem: RH KNOWLEDGE DEFICIT GENERAL Goal: RH STG INCREASE KNOWLEDGE OF SELF CARE AFTER HOSPITALIZATION Description: Patient and family will be able to manage care independently using handouts and educational materials  Outcome: Completed/Met   Problem: Consults Goal: RH SPINAL CORD INJURY PATIENT EDUCATION Description:  See Patient Education module for education specifics.  Outcome: Completed/Met Goal: Nutrition Consult-if indicated Outcome: Completed/Met   Problem: SCI BOWEL ELIMINATION Goal: RH STG MANAGE BOWEL WITH ASSISTANCE Description: STG Manage Bowel with mod I Assistance. Outcome: Completed/Met Goal: RH STG SCI MANAGE BOWEL PROGRAM W/ASSIST OR AS APPROPRIATE Description: STG SCI Manage bowel program w/ mod I assist or as appropriate. Outcome: Completed/Met   Problem: RH SKIN INTEGRITY Goal: RH STG SKIN FREE OF INFECTION/BREAKDOWN Outcome: Completed/Met Goal: RH STG MAINTAIN SKIN INTEGRITY WITH ASSISTANCE Description: STG Maintain Skin Integrity With mod I Assistance. Outcome: Completed/Met   Problem: RH SAFETY Goal: RH STG ADHERE TO SAFETY PRECAUTIONS W/ASSISTANCE/DEVICE Description: STG Adhere to Safety Precautions With Assistance/Device. Outcome: Completed/Met   Problem: RH KNOWLEDGE DEFICIT SCI Goal: RH STG INCREASE KNOWLEDGE OF SELF CARE AFTER SCI Outcome: Completed/Met

## 2020-03-01 NOTE — Discharge Summary (Addendum)
Physician Discharge Summary  Patient ID: Michele Bryan MRN: [id number] DOB/AGE: [birth year] 79 y.o.  Admit date: 02/21/2020 Discharge date: 03/01/2020  Discharge Diagnoses:  Principal Problem:   Cervical myelopathy (HCC) Active Problems:   MS (multiple sclerosis) (HCC)   Constipation   Anemia of chronic disease   Essential hypertension   Orthostatic hypotension   Benign essential HTN   Postoperative pain   Discharged Condition: stable   Significant Diagnostic Studies: N/A   Labs:  Basic Metabolic Panel: BMP Latest Ref Rng & Units 02/27/2020 02/22/2020 02/17/2020  Glucose 70 - 99 mg/dL 191(Y) 782(N) 562(Z)  BUN 8 - 23 mg/dL 19 10 13   Creatinine 0.44 - 1.00 mg/dL 3.08 6.57  BUN/Creat Ratio 6 - 22 (calc) - - -  Sodium 135 - 145 mmol/L 135 131(L) 140  Potassium 3.5 - 5.1 mmol/L 4.5 4.2 4.2  Chloride 98 - 111 mmol/L 98 96(L) 106  CO2 22 - 32 mmol/L 26 23 24   Calcium 8.9 - 10.3 mg/dL 9.8 9.0 8.46)    CBC: Recent Labs  Lab 02/27/20 0549 02/29/20 0542  WBC 6.8 5.4  NEUTROABS  --  3.0  HGB 10.5* 10.5*  HCT 34.0* 33.9*  MCV 86.7 86.5  PLT 458* 458*    CBG: No results for input(s): GLUCAP in the last 168 hours.  Brief HPI:   Michele Bryan is a 79 y.o. female with history of HTN, MS-last flare 3 years ago, RA, anemia, fall earlier this year and found ot have C/C2 fracture with displacement requiring fusion on 12/06/19. She developed recurrent neck pain with dysphagia and was found to have hardware failure and was admitted on 02/12/20 for revision with occiput to C5 posterior fusion by Dr. 02/05/20. Post op course significant for increase in neck pain with worsened posture on 12/15 due to fracture of C5 facet and was taken back to OR for revision with extension of fusion from occiput to T2 and was placed in Halo for support. Post op course significant for issues with delirium, HA/shoulder pain, poor safety awareness and weakness affecting ADLs and mobility. CIR  recommended due to functional decline.    Hospital Course: Michele Bryan was admitted to rehab 02/21/2020 for inpatient therapies to consist of PT and OT at least three hours five days a week. Past admission physiatrist, therapy team and rehab RN have worked together to provide customized collaborative inpatient rehab. Mentation has improved and no confusion noted. Neuropsych evaluation showed stable mood without cognitive deficits.  Pain has been managed with prn use of oxycodone. Colace added to help manage OIC. Her blood pressure were monitored on TID basis,  TED/abdominal binder ordered to manage orthostatic symptoms and Cozaar has been tapered down to 25 mg bid. Follow up CBC showed H/H is stable.  Repeat BMET showed mild hyponatremia and recommended repeat BMET in 1-2 weeks to monitor for recovery. Halo sites are C/D without signs of infection. She has made steady gains during her rehab stay and currently requires supervision to CGA with mobility. She will continue to receive follow up HHPT and [facility]OT by [facility] after discharge.    Rehab course: During patient's stay in rehab weekly team conferences were held to monitor patient's progress, set goals and discuss barriers to discharge. At admission, patient required mod assist with basic ADLs and min assist with mobility.  She  has had improvement in activity tolerance, balance, postural control as well as ability to compensate for deficits.  She is able  to complete ADL tasks at supervision to min assist with UE dressing. She requires supervision with cues for transfer and CGA for ambulation. Family requested shorter LOS and have been educated regarding all aspects of safety and care.    Discharge disposition: 01-Home or Self Care  Diet: Regular.   Special Instructions: 1. Perform pin care bid. Follow up with Dr. Johnsie Cancel for any problems with pins or wound.   2. Will need BMET repeated in 1-2 weeks to monitor hyponatremia.    Discharge  Instructions     Ambulatory referral to Physical Medicine Rehab   Complete by: As directed    moderate complexity follow-up 1 to 2 weeks cervical myelopathy with halo brace      Allergies as of 03/01/2020       Reactions   Codeine Nausea And Vomiting   Demerol [meperidine] Nausea And Vomiting        Medication List     STOP taking these medications    acetaminophen 650 MG CR tablet Commonly known as: TYLENOL Replaced by: acetaminophen 325 MG tablet   alendronate 70 MG tablet Commonly known as: FOSAMAX   Cystex Liqd   doxylamine (Sleep) 25 MG tablet Commonly known as: UNISOM   Krill Oil 500 MG Caps   metroNIDAZOLE 500 MG tablet Commonly known as: FLAGYL   naproxen 500 MG tablet Commonly known as: NAPROSYN   oxyCODONE-acetaminophen 5-325 MG tablet Commonly known as: Percocet   zolpidem 10 MG tablet Commonly known as: AMBIEN       TAKE these medications    acetaminophen 325 MG tablet Commonly known as: TYLENOL Take 1-2 tablets (325-650 mg total) by mouth every 4 (four) hours as needed for mild pain. Replaces: acetaminophen 650 MG CR tablet   amitriptyline 10 MG tablet Commonly known as: ELAVIL Take 1 tablet (10 mg total) by mouth at bedtime.   amLODipine 5 MG tablet Commonly known as: NORVASC Take 1 tablet (5 mg total) by mouth daily.   cyclobenzaprine 10 MG tablet Commonly known as: FLEXERIL Take 1 tablet (10 mg total) by mouth 3 (three) times daily as needed for muscle spasms.   docusate sodium 100 MG capsule Commonly known as: COLACE Take 1 capsule (100 mg total) by mouth daily.   gabapentin 100 MG capsule Commonly known as: NEURONTIN Take 1 capsule (100 mg total) by mouth 3 (three) times daily.   lidocaine 5 % Commonly known as: LIDODERM Place 1 patch onto the skin daily. Remove & Discard patch within 12 hours or as directed by MD   losartan 25 MG tablet Commonly known as: COZAAR Take 1 tablet (25 mg total) by mouth 2 (two) times  daily. What changed:  medication strength how much to take   multivitamin with minerals Tabs tablet Take 1 tablet by mouth 3 (three) times a week.   omeprazole 40 MG capsule Commonly known as: PRILOSEC Take 1 capsule (40 mg total) by mouth daily.   Orencia ClickJect 125 MG/ML Soaj Generic drug: Abatacept Inject 125 mg into the skin once a week. What changed: Another medication with the same name was removed. Continue taking this medication, and follow the directions you see here.   Oxycodone HCl 10 MG Tabs Take 1 tablet (10 mg total) by mouth every 4 (four) hours as needed for severe pain--Rx# 30 pills.   polyethylene glycol 17 g packet Commonly known as: MIRALAX / GLYCOLAX Take 17 g by mouth daily.   Vitamin B-12 5000 MCG Tbdp Take 5,000 mcg by  mouth daily.   Vitamin D 125 MCG (5000 UT) Caps Take 5,000 Units by mouth daily.   vitamin E 180 MG (400 UNITS) capsule Take 400 Units by mouth daily.        Follow-up Information     Marcello Fennel, MD Follow up.   Specialty: Physical Medicine and Rehabilitation Why: Office will call you with follow up appointment Contact information: [address] [place] [address] [phone]         Jadene Pierini, MD. Call.   Specialty: Neurosurgery Why: For post op appointment Contact information: [address] [place] [address] [phone]         Sigmund Hazel, MD. Call.   Specialty: Family Medicine Why: for post hospital follow up Contact information: [address] [place] [address] [phone]                 Signed: Jacquelynn Cree 03/01/2020, 12:10 PM Patient was seen, face-face, and physical exam performed by me on day of discharge, greater than 30 minutes of total time spent.. Please see progress note from day of discharge as well.  Maryla Morrow, MD, ABPMR

## 2020-03-06 DIAGNOSIS — G629 Polyneuropathy, unspecified: Secondary | ICD-10-CM | POA: Diagnosis not present

## 2020-03-06 DIAGNOSIS — G959 Disease of spinal cord, unspecified: Secondary | ICD-10-CM | POA: Diagnosis not present

## 2020-03-06 DIAGNOSIS — I10 Essential (primary) hypertension: Secondary | ICD-10-CM | POA: Diagnosis not present

## 2020-03-06 DIAGNOSIS — M1990 Unspecified osteoarthritis, unspecified site: Secondary | ICD-10-CM | POA: Diagnosis not present

## 2020-03-06 DIAGNOSIS — M0609 Rheumatoid arthritis without rheumatoid factor, multiple sites: Secondary | ICD-10-CM | POA: Diagnosis not present

## 2020-03-06 DIAGNOSIS — T84296D Other mechanical complication of internal fixation device of vertebrae, subsequent encounter: Secondary | ICD-10-CM | POA: Diagnosis not present

## 2020-03-06 DIAGNOSIS — D649 Anemia, unspecified: Secondary | ICD-10-CM | POA: Diagnosis not present

## 2020-03-06 DIAGNOSIS — M8008XD Age-related osteoporosis with current pathological fracture, vertebra(e), subsequent encounter for fracture with routine healing: Secondary | ICD-10-CM | POA: Diagnosis not present

## 2020-03-06 DIAGNOSIS — G35 Multiple sclerosis: Secondary | ICD-10-CM | POA: Diagnosis not present

## 2020-03-07 DIAGNOSIS — M0609 Rheumatoid arthritis without rheumatoid factor, multiple sites: Secondary | ICD-10-CM | POA: Diagnosis not present

## 2020-03-07 DIAGNOSIS — G959 Disease of spinal cord, unspecified: Secondary | ICD-10-CM | POA: Diagnosis not present

## 2020-03-07 DIAGNOSIS — G629 Polyneuropathy, unspecified: Secondary | ICD-10-CM | POA: Diagnosis not present

## 2020-03-07 DIAGNOSIS — I10 Essential (primary) hypertension: Secondary | ICD-10-CM | POA: Diagnosis not present

## 2020-03-07 DIAGNOSIS — G35 Multiple sclerosis: Secondary | ICD-10-CM | POA: Diagnosis not present

## 2020-03-07 DIAGNOSIS — M8008XD Age-related osteoporosis with current pathological fracture, vertebra(e), subsequent encounter for fracture with routine healing: Secondary | ICD-10-CM | POA: Diagnosis not present

## 2020-03-07 DIAGNOSIS — D649 Anemia, unspecified: Secondary | ICD-10-CM | POA: Diagnosis not present

## 2020-03-07 DIAGNOSIS — T84296D Other mechanical complication of internal fixation device of vertebrae, subsequent encounter: Secondary | ICD-10-CM | POA: Diagnosis not present

## 2020-03-07 DIAGNOSIS — M1990 Unspecified osteoarthritis, unspecified site: Secondary | ICD-10-CM | POA: Diagnosis not present

## 2020-03-08 ENCOUNTER — Other Ambulatory Visit: Payer: Self-pay

## 2020-03-08 ENCOUNTER — Encounter: Payer: Medicare PPO | Attending: Physical Medicine & Rehabilitation | Admitting: Physical Medicine & Rehabilitation

## 2020-03-08 ENCOUNTER — Encounter: Payer: Self-pay | Admitting: Physical Medicine & Rehabilitation

## 2020-03-08 VITALS — BP 156/90 | HR 82 | Temp 98.0°F | Ht 63.0 in | Wt 111.4 lb

## 2020-03-08 DIAGNOSIS — K5901 Slow transit constipation: Secondary | ICD-10-CM | POA: Diagnosis not present

## 2020-03-08 DIAGNOSIS — G8918 Other acute postprocedural pain: Secondary | ICD-10-CM | POA: Diagnosis not present

## 2020-03-08 DIAGNOSIS — G959 Disease of spinal cord, unspecified: Secondary | ICD-10-CM | POA: Diagnosis not present

## 2020-03-08 DIAGNOSIS — M0579 Rheumatoid arthritis with rheumatoid factor of multiple sites without organ or systems involvement: Secondary | ICD-10-CM | POA: Diagnosis not present

## 2020-03-08 DIAGNOSIS — R269 Unspecified abnormalities of gait and mobility: Secondary | ICD-10-CM | POA: Diagnosis not present

## 2020-03-08 DIAGNOSIS — I10 Essential (primary) hypertension: Secondary | ICD-10-CM | POA: Diagnosis not present

## 2020-03-08 MED ORDER — AMITRIPTYLINE HCL 25 MG PO TABS: 25.0000 mg | ORAL_TABLET | Freq: Every day | ORAL | 1 refills | Status: DC

## 2020-03-08 NOTE — Progress Notes (Signed)
Subjective:    Patient ID: Michele Bryan, female    DOB: [birth year], 80 y.o.   MRN: [id number]  HPI Female with history of HTN, MS-last flare 3 years ago, RA, anemia, fall earlier this year and found ot have C/C2 fracture with displacement requiring fusion on 12/06/19 presents for hospital follow up for unstable C1/2 fx with deformity s/p HALO and C1-C5 posterior fusion.  Husband supplements history. At discharge she was instructed to follow up with Neurosurg, whom she sees tomorrow. She sees PCP in 2 weeks. She is tolerating HALO. Denies falls.  Pain is manageable. Sleep is poor. BP is elevated, normally better controlled at home. She is going to follow up with Rheum regarding restarting Orencia. Bowel movements have normalized with prn Miralax.  Therapies: 2/week DME: Previously owned Mobility: Rollator at home  Pain Inventory Average Pain 5 Pain Right Now 8 My pain is sharp, stabbing, tingling and aching  In the last 24 hours, has pain interfered with the following? General activity 5 Relation with others 5 Enjoyment of life 7 What TIME of day is your pain at its worst? evening Sleep (in general) Poor  Pain is worse with: unsure Pain improves with: rest Relief from Meds: 5  use a cane use a walker how many minutes can you walk? 10-15 ability to climb steps?  yes do you drive?  no  retired I need assistance with the following:  dressing  weakness numbness tremor tingling trouble walking loss of taste or smell  HFU  HFU    Family History  Problem Relation Age of Onset  . Heart attack Father   . Breast cancer Neg Hx    Social History   Socioeconomic History  . Marital status: Married    Spouse name: Michele Bryan  . Number of children: 3  . Years of education: college  . Highest education level: Not on file  Occupational History    Comment: retired  Tobacco Use  . Smoking status: Never Smoker  . Smokeless tobacco: Never Used  Vaping Use  . Vaping Use:  Never used  Substance and Sexual Activity  . Alcohol use: No  . Drug use: Never  . Sexual activity: Not on file  Other Topics Concern  . Not on file  Social History Narrative   Patient lives at home with her husband Michele Bryan).   Retired.   Education- college.   Caffeine- Two cups of decaf tea daily.   Right handed.         Social Determinants of Health   Financial Resource Strain: Not on file  Food Insecurity: Not on file  Transportation Needs: Not on file  Physical Activity: Not on file  Stress: Not on file  Social Connections: Not on file   Past Surgical History:  Procedure Laterality Date  . APPLICATION OF INTRAOPERATIVE CT SCAN N/A 02/16/2020   Procedure: APPLICATION OF INTRAOPERATIVE CT SCAN;  Surgeon: Judith Part, MD;  Location: [facility];  Service: Neurosurgery;  Laterality: N/A;  . AUGMENTATION MAMMAPLASTY    . BACK SURGERY    . breast augumentation    . BREAST SURGERY     biopsy  . COLONOSCOPY    . DILATION AND CURETTAGE OF UTERUS    . EYE SURGERY     both eys,cataracts  . goiter    . HALO APPLICATION N/A 46/96/2952   Procedure: HALO TRACTION APPLICATION;  Surgeon: Judith Part, MD;  Location: [facility];  Service: Neurosurgery;  Laterality: N/A;  .  JOINT REPLACEMENT    . KNEE ARTHROSCOPY Right   . ORIF SHOULDER FRACTURE Left 03/05/2018   Procedure: OPEN REDUCTION INTERNAL FIXATION (ORIF) LEFT SCAPULA;  Surgeon: Beverely Low, MD;  Location[facility];  Service: Orthopedics;  Laterality: Left;  . POSTERIOR CERVICAL FUSION/FORAMINOTOMY N/A 12/06/2019   Procedure: Cervical one-two Posterior instrumented fusion;  Surgeon: Jadene Pierini, MD;  Location: [facility] OR;  Service: Neurosurgery;  Laterality: N/A;  . POSTERIOR CERVICAL FUSION/FORAMINOTOMY N/A 02/13/2020   Procedure: Revision of cervical instrumented fusion with Occiput to Cervical Four posterior instrumented fusion;  Surgeon: Jadene Pierini, MD;  Location: [facility] OR;  Service: Neurosurgery;  Laterality: N/A;   posterior  . POSTERIOR CERVICAL FUSION/FORAMINOTOMY N/A 02/16/2020   Procedure: Occiput to Thoracic Two Posterior Cervical Fusion with AIRO and Application of Halo;  Surgeon: Jadene Pierini, MD;  Location: [facility] OR;  Service: Neurosurgery;  Laterality: N/A;  . REVERSE SHOULDER ARTHROPLASTY Left 05/15/2017   Procedure: LEFT REVERSE SHOULDER ARTHROPLASTY;  Surgeon: Beverely Low, MD;  Location: [facility] OR;  Service: Orthopedics;  Laterality: Left;  . REVERSE SHOULDER ARTHROPLASTY Left 10/11/2018   Procedure: left shoulder irrigation and debridement, open poly exchange and removal of painful hardware;  Surgeon: Beverely Low, MD;  Location: [facility] ORS;  Service: Orthopedics;  Laterality: Left;  . THYROID LOBECTOMY    . TOTAL KNEE ARTHROPLASTY Right 06/15/2012   Procedure: RIGHT TOTAL KNEE ARTHROPLASTY;  Surgeon: [name], MD;  Location: [facility];  Service: Orthopedics;  Laterality: Right;  . WRIST SURGERY Left    Past Medical History:  Diagnosis Date  . Anemia   . Arthritis   . GERD (gastroesophageal reflux disease)   . History of blood transfusion   . Hyperlipidemia   . Hypertension   . MS (multiple sclerosis) (HCC)   . Neuromuscular disorder (HCC)    multiple scleroosis/peripheral neuropathy  . PONV (postoperative nausea and vomiting)   . Rheumatoid arthritis (HCC)   . Ulcer   . Vitamin D deficiency    BP (!) 156/90   Pulse 82   Temp 98 F (36.7 C)   Ht 5\' 3"  (1.6 m)   Wt 111 lb 6.4 oz (50.5 kg)   SpO2 98%   BMI 19.73 kg/m   Opioid Risk Score:   Fall Risk Score:  `1  Depression screen PHQ 2/9  Depression screen PHQ 2/9 03/08/2020  Decreased Interest 0  Down, Depressed, Hopeless 0  PHQ - 2 Score 0  Altered sleeping 3  Tired, decreased energy 1  Change in appetite 0  Feeling bad or failure about yourself  0  Trouble concentrating 0  Moving slowly or fidgety/restless 0  Suicidal thoughts 0  PHQ-9 Score 4  Difficult doing work/chores Not difficult at all    Review of  Systems  Musculoskeletal: Positive for arthralgias, gait problem, myalgias and neck stiffness.       Arm pain  Neurological: Positive for weakness and numbness. Negative for headaches.  All other systems reviewed and are negative.     Objective:   Physical Exam Constitutional: No distress . Vital signs reviewed. HENT: Normocephalic.  Atraumatic. +HALO. Eyes: EOMI. No discharge. Cardiovascular: No JVD.   Respiratory: Normal effort.  No stridor.   GI: Non-distended.   Skin: Warm and dry.  Neck incision healing. Psych: Normal mood.  Normal behavior. Musc: No edema in extremities.  No tenderness in extremities. Ulnar deviation of digits, unchanged Intrinsic hand atrophy Neuro: Alert Motor: B/l UE: 4+/5 proximal to distal, improving B/l  LE: HF, KE 4+/5, ADF 5/5, right slightly weaker than left     Assessment & Plan:  Female with history of HTN, MS-last flare 3 years ago, RA, anemia, fall earlier this year and found ot have C/C2 fracture with displacement requiring fusion on 12/06/19 presents for hospital follow up for unstable C1/2 fx with deformity s/p HALO and C1-C5 posterior fusion.  1.  Unstable C1/2 fx with deformity s/p HALO and C1-C5 posterior fusion and resulting mild weakness secondary to previous falls with impaired ADLs/mobility  Continue therapies  Follow up with Neurosurg  2. Pain Management:   Cont prn tylenol  3.  HTN:   Cont meds  Elevated today, however, controlled at home  4. RA multiple joints  Encouraged follow up with Rheum regarding restarting Orencia   5. H/o colitis and chronic intermittent LLQ pain/drug-induced constipation:             Continue Miralax prn  6.  Sleep disturbance:   Limited by HALO  Will increase Elavil to 25mg  HS  7. Gait abnormality  Cont walker for safety  Meds reviewed Referrals reviewed  All questions answered

## 2020-03-09 ENCOUNTER — Telehealth: Payer: Self-pay

## 2020-03-09 NOTE — Telephone Encounter (Signed)
Left message with Arline Asp giving approval for PT and OT and that Dr. Allena Katz will be signing orders

## 2020-03-09 NOTE — Telephone Encounter (Signed)
Margaret from [facility] calling asking if Dr. Allena Katz will be signing off on her [facility] services.  Cindy from [facility] called requesting verbal orders for HHPT 1wk1, 1wk2, 1wk5 starting 03/06/2020 and HHOT 1wk5 starting 03/08/2020.

## 2020-03-12 DIAGNOSIS — I10 Essential (primary) hypertension: Secondary | ICD-10-CM | POA: Diagnosis not present

## 2020-03-12 DIAGNOSIS — M8008XD Age-related osteoporosis with current pathological fracture, vertebra(e), subsequent encounter for fracture with routine healing: Secondary | ICD-10-CM | POA: Diagnosis not present

## 2020-03-12 DIAGNOSIS — M0609 Rheumatoid arthritis without rheumatoid factor, multiple sites: Secondary | ICD-10-CM | POA: Diagnosis not present

## 2020-03-12 DIAGNOSIS — T84296D Other mechanical complication of internal fixation device of vertebrae, subsequent encounter: Secondary | ICD-10-CM | POA: Diagnosis not present

## 2020-03-12 DIAGNOSIS — G35 Multiple sclerosis: Secondary | ICD-10-CM | POA: Diagnosis not present

## 2020-03-12 DIAGNOSIS — M1990 Unspecified osteoarthritis, unspecified site: Secondary | ICD-10-CM | POA: Diagnosis not present

## 2020-03-12 DIAGNOSIS — G629 Polyneuropathy, unspecified: Secondary | ICD-10-CM | POA: Diagnosis not present

## 2020-03-12 DIAGNOSIS — G959 Disease of spinal cord, unspecified: Secondary | ICD-10-CM | POA: Diagnosis not present

## 2020-03-12 DIAGNOSIS — D649 Anemia, unspecified: Secondary | ICD-10-CM | POA: Diagnosis not present

## 2020-03-13 DIAGNOSIS — G35 Multiple sclerosis: Secondary | ICD-10-CM | POA: Diagnosis not present

## 2020-03-13 DIAGNOSIS — M8008XD Age-related osteoporosis with current pathological fracture, vertebra(e), subsequent encounter for fracture with routine healing: Secondary | ICD-10-CM | POA: Diagnosis not present

## 2020-03-13 DIAGNOSIS — G629 Polyneuropathy, unspecified: Secondary | ICD-10-CM | POA: Diagnosis not present

## 2020-03-13 DIAGNOSIS — M0609 Rheumatoid arthritis without rheumatoid factor, multiple sites: Secondary | ICD-10-CM | POA: Diagnosis not present

## 2020-03-13 DIAGNOSIS — I10 Essential (primary) hypertension: Secondary | ICD-10-CM | POA: Diagnosis not present

## 2020-03-13 DIAGNOSIS — T84296D Other mechanical complication of internal fixation device of vertebrae, subsequent encounter: Secondary | ICD-10-CM | POA: Diagnosis not present

## 2020-03-13 DIAGNOSIS — M1990 Unspecified osteoarthritis, unspecified site: Secondary | ICD-10-CM | POA: Diagnosis not present

## 2020-03-13 DIAGNOSIS — D649 Anemia, unspecified: Secondary | ICD-10-CM | POA: Diagnosis not present

## 2020-03-13 DIAGNOSIS — G959 Disease of spinal cord, unspecified: Secondary | ICD-10-CM | POA: Diagnosis not present

## 2020-03-15 DIAGNOSIS — M8008XD Age-related osteoporosis with current pathological fracture, vertebra(e), subsequent encounter for fracture with routine healing: Secondary | ICD-10-CM | POA: Diagnosis not present

## 2020-03-15 DIAGNOSIS — G629 Polyneuropathy, unspecified: Secondary | ICD-10-CM | POA: Diagnosis not present

## 2020-03-15 DIAGNOSIS — T84296D Other mechanical complication of internal fixation device of vertebrae, subsequent encounter: Secondary | ICD-10-CM | POA: Diagnosis not present

## 2020-03-15 DIAGNOSIS — M0609 Rheumatoid arthritis without rheumatoid factor, multiple sites: Secondary | ICD-10-CM | POA: Diagnosis not present

## 2020-03-15 DIAGNOSIS — I10 Essential (primary) hypertension: Secondary | ICD-10-CM | POA: Diagnosis not present

## 2020-03-15 DIAGNOSIS — G35 Multiple sclerosis: Secondary | ICD-10-CM | POA: Diagnosis not present

## 2020-03-15 DIAGNOSIS — D649 Anemia, unspecified: Secondary | ICD-10-CM | POA: Diagnosis not present

## 2020-03-15 DIAGNOSIS — G959 Disease of spinal cord, unspecified: Secondary | ICD-10-CM | POA: Diagnosis not present

## 2020-03-15 DIAGNOSIS — M1990 Unspecified osteoarthritis, unspecified site: Secondary | ICD-10-CM | POA: Diagnosis not present

## 2020-03-21 DIAGNOSIS — G629 Polyneuropathy, unspecified: Secondary | ICD-10-CM | POA: Diagnosis not present

## 2020-03-21 DIAGNOSIS — G35 Multiple sclerosis: Secondary | ICD-10-CM | POA: Diagnosis not present

## 2020-03-21 DIAGNOSIS — T84296D Other mechanical complication of internal fixation device of vertebrae, subsequent encounter: Secondary | ICD-10-CM | POA: Diagnosis not present

## 2020-03-21 DIAGNOSIS — G959 Disease of spinal cord, unspecified: Secondary | ICD-10-CM | POA: Diagnosis not present

## 2020-03-21 DIAGNOSIS — I10 Essential (primary) hypertension: Secondary | ICD-10-CM | POA: Diagnosis not present

## 2020-03-21 DIAGNOSIS — M8008XD Age-related osteoporosis with current pathological fracture, vertebra(e), subsequent encounter for fracture with routine healing: Secondary | ICD-10-CM | POA: Diagnosis not present

## 2020-03-21 DIAGNOSIS — M1990 Unspecified osteoarthritis, unspecified site: Secondary | ICD-10-CM | POA: Diagnosis not present

## 2020-03-21 DIAGNOSIS — M0609 Rheumatoid arthritis without rheumatoid factor, multiple sites: Secondary | ICD-10-CM | POA: Diagnosis not present

## 2020-03-21 DIAGNOSIS — D649 Anemia, unspecified: Secondary | ICD-10-CM | POA: Diagnosis not present

## 2020-03-21 NOTE — Progress Notes (Signed)
Office Visit Note  Patient: Michele Bryan             Date of Birth: 1940-08-04           MRN: [id number]             PCP: Sigmund Hazel, MD Referring: Sigmund Hazel, MD Visit Date: 04/03/2020 Occupation: @GUAROCC @  Subjective:  Discuss starting forteo   History of Present Illness: HALEE GLYNN is a 80 y.o. female with history of seropositive rheumatoid arthritis and osteoarthritis.  She is on orencia 125 mg sq injections once weekly. She underwent C1-C2 fusion on 12/06/19 s/p vertebral fracture and was found to have loosening of C2 screws requiring a revision on 02/13/20 by Dr. 02/15/20.  She held orencia for 2 weeks prior to and 2 weeks after surgery.  She denies any complications after surgery.  Her discomfort has improved significantly.  She continues to wear a halo for support and has a follow-up appointment with Dr. Maurice Small on Friday.  She has been using a cane to assist with ambulation.  She denies any recent falls.  She has been taking Tylenol on a daily basis to manage her discomfort.  She denies any recent rheumatoid arthritis flares.  She has occasional pain in both hands, both wrists, and both knee joints.  She denies any joint swelling.  She would like to discuss starting on forteo daily injections for management of osteoporosis.  She denies any recent infections.     Activities of Daily Living:  Patient reports morning stiffness for 5-6 hours.   Patient Denies nocturnal pain.  Difficulty dressing/grooming: Denies Difficulty climbing stairs: Denies Difficulty getting out of chair: Reports Difficulty using hands for taps, buttons, cutlery, and/or writing: Denies  Review of Systems  Constitutional: Positive for fatigue.  HENT: Positive for mouth sores. Negative for mouth dryness and nose dryness.   Eyes: Negative for pain, itching and dryness.  Respiratory: Negative for shortness of breath and difficulty breathing.   Cardiovascular: Negative for chest pain and  palpitations.  Gastrointestinal: Negative for blood in stool, constipation and diarrhea.  Endocrine: Negative for increased urination.  Genitourinary: Negative for difficulty urinating.  Musculoskeletal: Positive for arthralgias, joint pain, myalgias, morning stiffness, muscle tenderness and myalgias. Negative for joint swelling.  Skin: Negative for color change, rash and redness.  Allergic/Immunologic: Negative for susceptible to infections.  Neurological: Negative for dizziness, numbness, headaches, memory loss and weakness.  Hematological: Negative for bruising/bleeding tendency.  Psychiatric/Behavioral: Negative for confusion.    PMFS History:  Patient Active Problem List   Diagnosis Date Noted  . Gait abnormality 03/08/2020  . Postoperative pain   . Benign essential HTN   . Constipation   . Anemia of chronic disease   . Essential hypertension   . Orthostatic hypotension   . Cervical myelopathy (HCC) 02/21/2020  . Closed cervical spine fracture (HCC) 12/06/2019  . Scapula fracture 03/05/2018  . Chronic left shoulder pain 11/25/2017  . Left cervical radiculopathy 09/29/2017  . 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 High risk medication use 05/06/2016  . Primary osteoarthritis of both hands 05/06/2016  . Primary osteoarthritis of left knee 05/06/2016  . Primary osteoarthritis of both feet 05/06/2016  . Dyslipidemia 05/06/2016  . Peripheral neuropathy 05/06/2016  . Diverticulosis of intestine without bleeding 05/06/2016  . Osteopenia  05/06/2016  . Vitamin D deficiency 05/06/2016  . Postural kyphosis of thoracic region  05/06/2016  . History of GI bleed 05/06/2016  . Cataract of both eyes 05/06/2016  . Urinary urgency 04/30/2015  . Chronic constipation 04/30/2015  . MS (multiple sclerosis) (HCC) 03/23/2013  . Expected blood loss anemia 06/16/2012  . S/P right  TKA 06/15/2012    Past Medical History:  Diagnosis Date  . Anemia   . Arthritis   . GERD (gastroesophageal reflux disease)   . History of blood transfusion   . Hyperlipidemia   . Hypertension   . MS (multiple sclerosis) (HCC)   . Neuromuscular disorder (HCC)    multiple scleroosis/peripheral neuropathy  . PONV (postoperative nausea and vomiting)   . Rheumatoid arthritis (HCC)   . Ulcer   . Vitamin D deficiency     Family History  Problem Relation Age of Onset  . Heart attack Father   . Breast cancer Neg Hx    Past Surgical History:  Procedure Laterality Date  . APPLICATION OF INTRAOPERATIVE CT SCAN N/A 02/16/2020   Procedure: APPLICATION OF INTRAOPERATIVE CT SCAN;  Surgeon: Jadene Pierini, MD;  Location: [facility] OR;  Service: Neurosurgery;  Laterality: N/A;  . AUGMENTATION MAMMAPLASTY    . BACK SURGERY    . breast augumentation    . BREAST SURGERY     biopsy  . COLONOSCOPY    . DILATION AND CURETTAGE OF UTERUS    . EYE SURGERY     both eys,cataracts  . goiter    . HALO APPLICATION N/A 02/16/2020   Procedure: HALO TRACTION APPLICATION;  Surgeon: Jadene Pierini, MD;  Location: [facility] OR;  Service: Neurosurgery;  Laterality: N/A;  . JOINT REPLACEMENT    . KNEE ARTHROSCOPY Right   . ORIF SHOULDER FRACTURE Left 03/05/2018   Procedure: OPEN REDUCTION INTERNAL FIXATION (ORIF) LEFT SCAPULA;  Surgeon: Beverely Low, MD;  Location: [facility] OR;  Service: Orthopedics;  Laterality: Left;  . POSTERIOR CERVICAL FUSION/FORAMINOTOMY N/A 12/06/2019   Procedure: Cervical one-two Posterior instrumented fusion;  Surgeon: Jadene Pierini, MD;  Location: [facility] OR;  Service: Neurosurgery;  Laterality: N/A;  . POSTERIOR CERVICAL FUSION/FORAMINOTOMY N/A 02/13/2020   Procedure: Revision of cervical instrumented fusion with Occiput to Cervical Four posterior instrumented fusion;  Surgeon: Jadene Pierini, MD;  Location: [facility] OR;  Service: Neurosurgery;  Laterality: N/A;  posterior  . POSTERIOR  CERVICAL FUSION/FORAMINOTOMY N/A 02/16/2020   Procedure: Occiput to Thoracic Two Posterior Cervical Fusion with AIRO and Application of Halo;  Surgeon: Jadene Pierini, MD;  Location: [facility] OR;  Service: Neurosurgery;  Laterality: N/A;  . REVERSE SHOULDER ARTHROPLASTY Left 05/15/2017   Procedure: LEFT REVERSE SHOULDER ARTHROPLASTY;  Surgeon: Beverely Low, MD;  Location: [facility] OR;  Service: Orthopedics;  Laterality: Left;  . REVERSE SHOULDER ARTHROPLASTY Left 10/11/2018   Procedure: left shoulder irrigation and debridement, open poly exchange and removal of painful hardware;  Surgeon: Beverely Low, MD;  Location: [facility] ORS;  Service: Orthopedics;  Laterality: Left;  . THYROID LOBECTOMY    . TOTAL KNEE ARTHROPLASTY Right 06/15/2012   Procedure: RIGHT TOTAL KNEE ARTHROPLASTY;  Surgeon: [name], MD;  Location: [facility] ORS;  Service: Orthopedics;  Laterality: Right;  . WRIST SURGERY Left    Social History   Social History Narrative   Patient lives at home with her husband [name]).   Retired.   Education- college.   Caffeine- Two cups of decaf tea daily.   Right handed.         Immunization History  Administered Date(s) Administered  . Influenza, High Dose  Seasonal PF 12/03/2013  . Influenza,inj,quad, With Preservative 11/23/2014  . Influenza-Unspecified 12/19/2017  . PFIZER(Purple Top)SARS-COV-2 Vaccination 04/16/2019, 05/29/2019, 01/09/2020  . Td 12/15/2017  . Zoster Recombinat (Shingrix) 08/10/2017, 01/05/2018     Objective: Vital Signs: BP (!) 160/91 (BP Location: Right Arm, Patient Position: Sitting, Cuff Size: Normal)   Pulse 85   Resp 13   Ht  (1.6 m)   Wt 108 lb (49 kg) Comment: per patient  BMI 19.13 kg/m    Physical Exam Vitals and nursing note reviewed.  Constitutional:      Appearance: She is well-developed and well-nourished.  HENT:     Head: Normocephalic and atraumatic.  Eyes:     Extraocular Movements: EOM normal.     Conjunctiva/sclera: Conjunctivae normal.   Cardiovascular:     Pulses: Intact distal pulses.  Pulmonary:     Effort: Pulmonary effort is normal.  Abdominal:     Palpations: Abdomen is soft.  Musculoskeletal:     Cervical back: Normal range of motion.  Skin:    General: Skin is warm and dry.     Capillary Refill: Capillary refill takes less than 2 seconds.  Neurological:     Mental Status: She is alert and oriented to person, place, and time.  Psychiatric:        Mood and Affect: Mood and affect normal.        Behavior: Behavior normal.      Musculoskeletal Exam: Patient wearing halo for support.  No tenderness or inflammation on elbow joints or wrist joints.  CMC joint prominence and thickening.  PIP and DIP thickening consistent with osteoarthritis of both hands.  Right knee replacement has good ROM with no discomfort.  Left knee joint good ROM with no warmth or effusion.  Ankle joints good ROM with no tenderness or inflammation.   CDAI Exam: CDAI Score: 0.8  Patient Global: 4 mm; Provider Global: 4 mm Swollen: 0 ; Tender: 0  Joint Exam 04/03/2020   No joint exam has been documented for this visit   There is currently no information documented on the homunculus. Go to the Rheumatology activity and complete the homunculus joint exam.  Investigation: No additional findings.  Imaging: No results found.  Recent Labs: Lab Results  Component Value Date   WBC 5.4 02/29/2020   HGB 10.5 (L) 02/29/2020   PLT 458 (H) 02/29/2020   NA 135 02/27/2020   K 4.5 02/27/2020   CL 98 02/27/2020   CO2 26 02/27/2020   GLUCOSE 101 (H) 02/27/2020   BUN 19 02/27/2020   CREATININE 0.90 02/27/2020   BILITOT 1.1 02/22/2020   ALKPHOS 92 02/22/2020   AST 14 (L) 02/22/2020   ALT 15 02/22/2020   PROT 5.8 (L) 02/22/2020   ALBUMIN 2.9 (L) 02/22/2020   CALCIUM 9.8 02/27/2020   GFRAA >60 12/02/2019   QFTBGOLDPLUS NEGATIVE 01/03/2020    Speciality Comments: PLQ Eye Exam: 07/30/17 WNL @ [facility] Osteoporosis managed by PCP-ACY  10/12/2018  Procedures:  No procedures performed Allergies: Codeine and Demerol [meperidine]   Assessment / Plan:     Visit Diagnoses: 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: She has no synovitis on exam.  She has not had any recent rheumatoid arthritis flares.  She is clinically doing well on Orencia 125 mg sq injections once weekly.  She underwent C1-C2 fusion status post vertebral fracture on 12/06/2019 but she developed loosening of C2 screws requiring a revision with occiput to C5 posterior  fusion by Dr. Maurice Small on 02/13/2020.  She has been wearing a halo for support and has an upcoming appointment with Dr. Maurice Small on Friday for further guidance.  According to the patient she held Orencia 2 weeks prior to in 2 weeks after surgery and she did not experience a flare of RA during that time.  She has been taking Tylenol as needed for pain relief on a daily basis.  She continues to have discomfort and stiffness in both hands due to underlying osteoarthritis but has no inflammation on exam.  She has severe PIP and DIP thickening with subluxation as well as CMC joint thickening bilaterally. She was given a prescription for bilateral CMC joint braces. She will continue on Orencia 125 mg sq injections once weekly.  She was advised to notify us if she develops recurrent flares.  She will follow-up in the office in 5 months.   High risk medication use - Orencia 125 mg subq injection once weekly started on 12/08/17.  (Plaquenil was discontinued).  She discontinued Methotrexate due to experiencing increased SOB.   CBC updated on 02/29/20.  CMP updated on 02/22/20.  We will update lab work today prior to starting on forteo.  Her next lab work will be due in April and every 3 months.  Standing orders for CBC and CMP remain in place. TB gold negative on 01/03/20 and will continue to be monitored yearly.  - Plan: CBC with Differential/Platelet, COMPLETE METABOLIC  PANEL WITH GFR She has not had any recent infections.  She was advised to hold Orencia if she develops signs or symptoms of an infection and to resume once the infection has completely cleared.  She has received 3 Pfizer COVID-19 vaccinations.  Primary osteoarthritis of both hands: She has severe PIP and DIP thickening consistent with osteoarthritis of both hands.  CMC joint thickening and prominence noted bilaterally.  She was given a prescription for bilateral CMC joint braces.  We discussed the importance of joint protection and muscle strengthening.  H/O total shoulder replacement, left - She had surgery by Dr. Devonne Doughty in the past. Doing well. Difficult to assess ROM while patient in halo for support.   Status post right knee replacement: Doing well. She experiences intermittent discomfort in both knees.   Primary osteoarthritis of left knee: She has good ROM of the left knee joint with no discomfort. No warmth or effusion noted on exam.  She has intermittent discomfort in the left knee joint.  Primary osteoarthritis of both feet: She is not having any increased discomfort in her feet at this time.   Closed fracture of cervical vertebra, unspecified cervical vertebral level, sequela: Patient had a fall in January 2020 at which time she fractured C1 and C2 with displacement requiring fusion on 12/06/2019.  Postoperatively she continued to have recurrent dysphagia and was found to have loosening of C2 screws.  She underwent a revision with occiput to C5 posterior fusion by Dr. Maurice Small on 02/13/2020.  She held Orencia 2 weeks prior to in 2 weeks after surgery and did not have any complications or infections.  She is currently wearing a halo for support.  Her discomfort has been tolerable and has been well managed with Tylenol as needed.  She is no longer using Lidoderm patches.  She continues to take gabapentin as prescribed.  She has an upcoming appointment with Dr. Maurice Small on Friday.  Osteopenia  of multiple sites - DEXA on 02/22/18: The BMD measured at Femur Total Right is 0.742 g/cm2  with a T-score of -2.1. She has been taking Fosamax 70 mg 1 tablet by mouth once weekly prescribed by her PCP.  [name] spoke with the patients husband on 03/23/19 and they agreed upon proceeding with forteo and discontinuing fosamax to increase efficacy.  Forteo has been approved.  Indications, contraindications and potential side effects were discussed with Chesley Mires today in the office and all questions were addressed. She also demonstrated how to use the device. CBC and CMP updated today.  Vitamin D WNL on 02/20/20. Prescription pending lab work. She is also due to update DEXA which has been done at the breast center of [facility].  Her PCP will need to order the DEXA since we have a conflict of interest with ordering there.   History of vitamin D deficiency: She is taking vitamin D 5,000 units daily. Her vitamin D was 30.65 on 02/20/20.    Postural kyphosis of thoracic region   Other medical conditions are listed as follows:   History of diverticulosis  History of multiple sclerosis (HCC)  History of cataract  History of GI bleed  History of peripheral neuropathy  Dyslipidemia  Orders: Orders Placed This Encounter  Procedures  . CBC with Differential/Platelet  . COMPLETE METABOLIC PANEL WITH GFR   No orders of the defined types were placed in this encounter.     Follow-Up Instructions: Return in about 5 months (around 08/31/2020) for Rheumatoid arthritis, Osteoarthritis.   Gearldine Bienenstock, PA-C  Note - This record has been created using Dragon software.  Chart creation errors have been sought, but may not always  have been located. Such creation errors do not reflect on  the standard of medical care.

## 2020-03-22 DIAGNOSIS — G35 Multiple sclerosis: Secondary | ICD-10-CM | POA: Diagnosis not present

## 2020-03-22 DIAGNOSIS — I10 Essential (primary) hypertension: Secondary | ICD-10-CM | POA: Diagnosis not present

## 2020-03-22 DIAGNOSIS — G629 Polyneuropathy, unspecified: Secondary | ICD-10-CM | POA: Diagnosis not present

## 2020-03-22 DIAGNOSIS — T84296D Other mechanical complication of internal fixation device of vertebrae, subsequent encounter: Secondary | ICD-10-CM | POA: Diagnosis not present

## 2020-03-22 DIAGNOSIS — M0609 Rheumatoid arthritis without rheumatoid factor, multiple sites: Secondary | ICD-10-CM | POA: Diagnosis not present

## 2020-03-22 DIAGNOSIS — G959 Disease of spinal cord, unspecified: Secondary | ICD-10-CM | POA: Diagnosis not present

## 2020-03-22 DIAGNOSIS — M1990 Unspecified osteoarthritis, unspecified site: Secondary | ICD-10-CM | POA: Diagnosis not present

## 2020-03-22 DIAGNOSIS — M8008XD Age-related osteoporosis with current pathological fracture, vertebra(e), subsequent encounter for fracture with routine healing: Secondary | ICD-10-CM | POA: Diagnosis not present

## 2020-03-22 DIAGNOSIS — D649 Anemia, unspecified: Secondary | ICD-10-CM | POA: Diagnosis not present

## 2020-03-26 DIAGNOSIS — T84296D Other mechanical complication of internal fixation device of vertebrae, subsequent encounter: Secondary | ICD-10-CM | POA: Diagnosis not present

## 2020-03-26 DIAGNOSIS — D649 Anemia, unspecified: Secondary | ICD-10-CM | POA: Diagnosis not present

## 2020-03-26 DIAGNOSIS — G629 Polyneuropathy, unspecified: Secondary | ICD-10-CM | POA: Diagnosis not present

## 2020-03-26 DIAGNOSIS — M8008XD Age-related osteoporosis with current pathological fracture, vertebra(e), subsequent encounter for fracture with routine healing: Secondary | ICD-10-CM | POA: Diagnosis not present

## 2020-03-26 DIAGNOSIS — I10 Essential (primary) hypertension: Secondary | ICD-10-CM | POA: Diagnosis not present

## 2020-03-26 DIAGNOSIS — G35 Multiple sclerosis: Secondary | ICD-10-CM | POA: Diagnosis not present

## 2020-03-26 DIAGNOSIS — M1990 Unspecified osteoarthritis, unspecified site: Secondary | ICD-10-CM | POA: Diagnosis not present

## 2020-03-26 DIAGNOSIS — M0609 Rheumatoid arthritis without rheumatoid factor, multiple sites: Secondary | ICD-10-CM | POA: Diagnosis not present

## 2020-03-26 DIAGNOSIS — G959 Disease of spinal cord, unspecified: Secondary | ICD-10-CM | POA: Diagnosis not present

## 2020-03-27 DIAGNOSIS — I10 Essential (primary) hypertension: Secondary | ICD-10-CM | POA: Diagnosis not present

## 2020-03-27 DIAGNOSIS — Z9889 Other specified postprocedural states: Secondary | ICD-10-CM | POA: Diagnosis not present

## 2020-03-27 DIAGNOSIS — M810 Age-related osteoporosis without current pathological fracture: Secondary | ICD-10-CM | POA: Diagnosis not present

## 2020-03-27 DIAGNOSIS — K219 Gastro-esophageal reflux disease without esophagitis: Secondary | ICD-10-CM | POA: Diagnosis not present

## 2020-03-27 DIAGNOSIS — G4700 Insomnia, unspecified: Secondary | ICD-10-CM | POA: Diagnosis not present

## 2020-03-29 DIAGNOSIS — M1990 Unspecified osteoarthritis, unspecified site: Secondary | ICD-10-CM | POA: Diagnosis not present

## 2020-03-29 DIAGNOSIS — T84296D Other mechanical complication of internal fixation device of vertebrae, subsequent encounter: Secondary | ICD-10-CM | POA: Diagnosis not present

## 2020-03-29 DIAGNOSIS — G959 Disease of spinal cord, unspecified: Secondary | ICD-10-CM | POA: Diagnosis not present

## 2020-03-29 DIAGNOSIS — M0609 Rheumatoid arthritis without rheumatoid factor, multiple sites: Secondary | ICD-10-CM | POA: Diagnosis not present

## 2020-03-29 DIAGNOSIS — G629 Polyneuropathy, unspecified: Secondary | ICD-10-CM | POA: Diagnosis not present

## 2020-03-29 DIAGNOSIS — M8008XD Age-related osteoporosis with current pathological fracture, vertebra(e), subsequent encounter for fracture with routine healing: Secondary | ICD-10-CM | POA: Diagnosis not present

## 2020-03-29 DIAGNOSIS — G35 Multiple sclerosis: Secondary | ICD-10-CM | POA: Diagnosis not present

## 2020-03-29 DIAGNOSIS — D649 Anemia, unspecified: Secondary | ICD-10-CM | POA: Diagnosis not present

## 2020-03-29 DIAGNOSIS — I10 Essential (primary) hypertension: Secondary | ICD-10-CM | POA: Diagnosis not present

## 2020-03-29 MED FILL — ORENCIA 125 MG/ML SYRINGE: 125 | 28 days supply | Qty: 4 | Fill #1

## 2020-03-30 DIAGNOSIS — M0609 Rheumatoid arthritis without rheumatoid factor, multiple sites: Secondary | ICD-10-CM | POA: Diagnosis not present

## 2020-03-30 DIAGNOSIS — I10 Essential (primary) hypertension: Secondary | ICD-10-CM | POA: Diagnosis not present

## 2020-03-30 DIAGNOSIS — G959 Disease of spinal cord, unspecified: Secondary | ICD-10-CM | POA: Diagnosis not present

## 2020-03-30 DIAGNOSIS — M1990 Unspecified osteoarthritis, unspecified site: Secondary | ICD-10-CM | POA: Diagnosis not present

## 2020-03-30 DIAGNOSIS — T84296D Other mechanical complication of internal fixation device of vertebrae, subsequent encounter: Secondary | ICD-10-CM | POA: Diagnosis not present

## 2020-03-30 DIAGNOSIS — G35 Multiple sclerosis: Secondary | ICD-10-CM | POA: Diagnosis not present

## 2020-03-30 DIAGNOSIS — M8008XD Age-related osteoporosis with current pathological fracture, vertebra(e), subsequent encounter for fracture with routine healing: Secondary | ICD-10-CM | POA: Diagnosis not present

## 2020-03-30 DIAGNOSIS — G629 Polyneuropathy, unspecified: Secondary | ICD-10-CM | POA: Diagnosis not present

## 2020-03-30 DIAGNOSIS — D649 Anemia, unspecified: Secondary | ICD-10-CM | POA: Diagnosis not present

## 2020-03-30 NOTE — Telephone Encounter (Addendum)
Ms. Bick was recently hospitalized d/t cervical myelopathy.She has f/u appointment with Sherron Ales, PA-C, scheduled on 04/03/20. Today, I called family to notify about Forteo to see if they'd like to apply for patient assistance so they could bring along any required income documents. She was approved on 02/21/21 but was hospitalized so I hadn't reached out.  They are amenable to paying $100 per month for Forteo - they pay $100 per month for Orencia. They did income calculations while I was on the phone and found to exceed income threshold for [name] ($52,260 for household of 2).  We discussed that Sharren Bridge is a multiple-dose pen that we would prescribe pen needles with it. Discussed Forteo mechanism (PTH mimic) in stimulating bone production. We discussed side effects (GI upset, injection site reaction, joint pain/arthralgia) and management of injection site reactions. Discussed risk of hypercalcemia and that we'd monitor calcium.  Family inquired about potential drug interactions - none identified. Patient feels comfortable self-administering since she takes Orencia as well. We discussed that the Forteo would be for osteoporosis and Orencia weekly would be continued for RA. Family verbalized understanding.  Will route to Sherron Ales, PA-C, as Lorain Childes prior to appointment.  Chesley Mires, PharmD, MPH Clinical Pharmacist (Rheumatology and Pulmonology)

## 2020-04-03 ENCOUNTER — Other Ambulatory Visit: Payer: Self-pay

## 2020-04-03 ENCOUNTER — Ambulatory Visit: Payer: Medicare PPO | Admitting: Physician Assistant

## 2020-04-03 ENCOUNTER — Encounter: Payer: Self-pay | Admitting: Physician Assistant

## 2020-04-03 ENCOUNTER — Telehealth: Payer: Self-pay

## 2020-04-03 VITALS — BP 160/91 | HR 85 | Resp 13 | Ht 63.0 in | Wt 108.0 lb

## 2020-04-03 DIAGNOSIS — M19041 Primary osteoarthritis, right hand: Secondary | ICD-10-CM | POA: Diagnosis not present

## 2020-04-03 DIAGNOSIS — Z96651 Presence of right artificial knee joint: Secondary | ICD-10-CM

## 2020-04-03 DIAGNOSIS — Z8719 Personal history of other diseases of the digestive system: Secondary | ICD-10-CM

## 2020-04-03 DIAGNOSIS — Z8639 Personal history of other endocrine, nutritional and metabolic disease: Secondary | ICD-10-CM

## 2020-04-03 DIAGNOSIS — E785 Hyperlipidemia, unspecified: Secondary | ICD-10-CM

## 2020-04-03 DIAGNOSIS — Z8669 Personal history of other diseases of the nervous system and sense organs: Secondary | ICD-10-CM

## 2020-04-03 DIAGNOSIS — M1712 Unilateral primary osteoarthritis, left knee: Secondary | ICD-10-CM

## 2020-04-03 DIAGNOSIS — M4004 Postural kyphosis, thoracic region: Secondary | ICD-10-CM

## 2020-04-03 DIAGNOSIS — G35 Multiple sclerosis: Secondary | ICD-10-CM

## 2020-04-03 DIAGNOSIS — S129XXS Fracture of neck, unspecified, sequela: Secondary | ICD-10-CM

## 2020-04-03 DIAGNOSIS — Z79899 Other long term (current) drug therapy: Secondary | ICD-10-CM | POA: Diagnosis not present

## 2020-04-03 DIAGNOSIS — M8589 Other specified disorders of bone density and structure, multiple sites: Secondary | ICD-10-CM

## 2020-04-03 DIAGNOSIS — M0579 Rheumatoid arthritis with rheumatoid factor of multiple sites without organ or systems involvement: Secondary | ICD-10-CM | POA: Diagnosis not present

## 2020-04-03 DIAGNOSIS — Z96612 Presence of left artificial shoulder joint: Secondary | ICD-10-CM

## 2020-04-03 DIAGNOSIS — M19042 Primary osteoarthritis, left hand: Secondary | ICD-10-CM

## 2020-04-03 DIAGNOSIS — M19071 Primary osteoarthritis, right ankle and foot: Secondary | ICD-10-CM

## 2020-04-03 DIAGNOSIS — M19072 Primary osteoarthritis, left ankle and foot: Secondary | ICD-10-CM

## 2020-04-03 NOTE — Patient Instructions (Addendum)
Standing Labs We placed an order today for your standing lab work.   Please have your standing labs drawn in April and every 3 months  If possible, please have your labs drawn 2 weeks prior to your appointment so that the provider can discuss your results at your appointment.  We have open lab daily Monday through Thursday from 8:30-12:30 PM and 1:30-4:30 PM and Friday from 8:30-12:30 PM and 1:30-4:00 PM at the office of Dr. Shaili Deveshwar, [facility].   Please be advised, all patients with office appointments requiring lab work will take precedents over walk-in lab work.  If possible, please come for your lab work on Monday and Friday afternoons, as you may experience shorter wait times. The office is located at [address], [address], , [place] [address] No appointment is necessary.   Labs are drawn by [facility]. Please bring your co-pay at the time of your lab draw.  You may receive a bill from [facility] for your lab work.  If you wish to have your labs drawn at another location, please call the office 24 hours in advance to send orders.  If you have any questions regarding directions or hours of operation,  please call [phone].   As a reminder, please drink plenty of water prior to coming for your lab work. Thanks!  

## 2020-04-03 NOTE — Telephone Encounter (Signed)
Called patient and advised her to contact Dr. Wynonia Lawman office to request an order for a DEXA scan, per Sherron Ales, PA-C. Patient's last DEXA was done at the breast center. Patient verbalized understanding.

## 2020-04-03 NOTE — Progress Notes (Signed)
Pharmacy Note  Subjective:  Patient presents today to [facility] for follow up office visit with Sherron Ales, PA-C.   Patient was seen by the pharmacist for counseling on Forteo. She was recently hospitalized in December 2021 for cervical myelopathy. She continues on Orencia SQ weekly for rheumatoid arthritis and is comfortable administering injectable medications. Today she is eager to start Forteo for her osteoporosis.  Objective: CMP     Component Value Date/Time   NA 135 02/27/2020 0549   K 4.5 02/27/2020 0549   CL 98 02/27/2020 0549   CO2 26 02/27/2020 0549   GLUCOSE 101 (H) 02/27/2020 0549   BUN 19 02/27/2020 0549   CREATININE 0.90 02/27/2020 0549   CREATININE 0.98 (H) 08/04/2019 1058   CALCIUM 9.8 02/27/2020 0549   PROT 5.8 (L) 02/22/2020 0435   ALBUMIN 2.9 (L) 02/22/2020 0435   AST 14 (L) 02/22/2020 0435   ALT 15 02/22/2020 0435   ALKPHOS 92 02/22/2020 0435   BILITOT 1.1 02/22/2020 0435   GFRNONAA >60 02/27/2020 0549   GFRNONAA 56 (L) 08/04/2019 1058   GFRAA >60 12/02/2019 1155   GFRAA 65 08/04/2019 1058    Vitamin D Lab Results  Component Value Date   VD25OH 30.65 02/20/2020    DEXA scan, previous DEXA scan T-score was minus -2.1. due to repeat DEXA ordered by PCP.   Assessment/Plan:  Counseled patient on purpose, proper use, storage, and adverse effects of Forteo.  Discussed that pen is good for 28 days and must be stored in fridge after each dose. Counseled patient that Sharren Bridge is a medication that must be injected once daily and that prescription for pen needles will be sent with Forteo prescription.  Advised patient to continue taking calcium 1200 mg daily and vitamin D 5000 units daily.  Reviewed the most common adverse effects of Forteo including orthostatic hypotension, GI upset, injection site reaction, and joint pain/arthralgia.  Discussed injection site reaction management and injection site locations. Discussed alternating injection site and  that there is no significant drug interaction with her current RA and all other medications.  Discussed management of orthostatic hypotension including adequate hydration, slowly rising from bed/chairs to prevent fals, and taking Forteo at night if needed.   We walked through [employer] administration with demo pen and pen needle.  Discussed appropriate disposal of sharps.  Repeat CBC/CMP today since hospitalization. Vit D wnl on 02/20/20.  Patient is already approved for Forteo through insurance with a copay of $100. Patient and her family are amenable to paying this copay. Will send prescription for Forteo and pen needles to [facility]. Patient provided with pharmacy phone number.  Chesley Mires, PharmD, MPH Clinical Pharmacist (Rheumatology and Pulmonology)

## 2020-04-04 ENCOUNTER — Other Ambulatory Visit: Payer: Self-pay | Admitting: Family Medicine

## 2020-04-04 ENCOUNTER — Other Ambulatory Visit: Payer: Self-pay | Admitting: Physician Assistant

## 2020-04-04 DIAGNOSIS — M810 Age-related osteoporosis without current pathological fracture: Secondary | ICD-10-CM

## 2020-04-04 LAB — COMPLETE METABOLIC PANEL WITH GFR
AG Ratio: 2.1 (calc) (ref 1.0–2.5)
ALT: 8 U/L (ref 6–29)
AST: 12 U/L (ref 10–35)
Albumin: 4.4 g/dL (ref 3.6–5.1)
Alkaline phosphatase (APISO): 92 U/L (ref 37–153)
BUN: 14 mg/dL (ref 7–25)
CO2: 28 mmol/L (ref 20–32)
Calcium: 9.5 mg/dL (ref 8.6–10.4)
Chloride: 102 mmol/L (ref 98–110)
Creat: 0.67 mg/dL (ref 0.60–0.93)
GFR, Est African American: 98 mL/min/{1.73_m2} (ref >=60)
GFR, Est Non African American: 85 mL/min/{1.73_m2} (ref >=60)
Globulin: 2.1 g/dL (calc) (ref 1.9–3.7)
Glucose, Bld: 78 mg/dL (ref 65–99)
Potassium: 4.6 mmol/L (ref 3.5–5.3)
Sodium: 139 mmol/L (ref 135–146)
Total Bilirubin: 0.7 mg/dL (ref 0.2–1.2)
Total Protein: 6.5 g/dL (ref 6.1–8.1)

## 2020-04-04 LAB — CBC WITH DIFFERENTIAL/PLATELET
Absolute Monocytes: 456 cells/uL (ref 200–950)
Basophils Absolute: 42 cells/uL (ref 0–200)
Basophils Relative: 0.8 %
Eosinophils Absolute: 111 cells/uL (ref 15–500)
Eosinophils Relative: 2.1 %
HCT: 34.6 % — ABNORMAL LOW (ref 35.0–45.0)
Hemoglobin: 11.2 g/dL — ABNORMAL LOW (ref 11.7–15.5)
Lymphs Abs: 1950 cells/uL (ref 850–3900)
MCH: 27.8 pg (ref 27.0–33.0)
MCHC: 32.4 g/dL (ref 32.0–36.0)
MCV: 85.9 fL (ref 80.0–100.0)
MPV: 9.2 fL (ref 7.5–12.5)
Monocytes Relative: 8.6 %
Neutro Abs: 2740 cells/uL (ref 1500–7800)
Neutrophils Relative %: 51.7 %
Platelets: 269 10*3/uL (ref 140–400)
RBC: 4.03 10*6/uL (ref 3.80–5.10)
RDW: 15.2 % — ABNORMAL HIGH (ref 11.0–15.0)
Total Lymphocyte: 36.8 %
WBC: 5.3 10*3/uL (ref 3.8–10.8)

## 2020-04-04 MED ORDER — TERIPARATIDE 620 MCG/2.48ML ~~LOC~~ SOPN: 20.0000 ug | PEN_INJECTOR | Freq: Every day | SUBCUTANEOUS | 5 refills | Status: DC

## 2020-04-04 MED ORDER — PEN NEEDLES 31G X 5 MM MISC: 3 refills | Status: DC

## 2020-04-04 MED FILL — UNIFINE PENTIPS 31GX3/16: 31G X 5 MM | 90 days supply | Qty: 100 | Fill #0

## 2020-04-04 NOTE — Progress Notes (Signed)
Hgb and hct remain slightly low but are improving. Rest of CBC WNL.  CMP WNL.

## 2020-04-04 NOTE — Telephone Encounter (Addendum)
CBC/CMP wnl. Vit D wnl on 02/20/20.  Rx for Forteo and pen needles (31G, 3/16") sent to [facility].  ATC patient to schedule shipment. We will have to order Forteo, so will be able to ship it Monday to arrive on Tuesday. Will ATC patient again tomorrow.  Chesley Mires, PharmD, MPH Clinical Pharmacist (Rheumatology and Pulmonology)

## 2020-04-05 DIAGNOSIS — T84296D Other mechanical complication of internal fixation device of vertebrae, subsequent encounter: Secondary | ICD-10-CM | POA: Diagnosis not present

## 2020-04-05 DIAGNOSIS — M0609 Rheumatoid arthritis without rheumatoid factor, multiple sites: Secondary | ICD-10-CM | POA: Diagnosis not present

## 2020-04-05 DIAGNOSIS — M8008XD Age-related osteoporosis with current pathological fracture, vertebra(e), subsequent encounter for fracture with routine healing: Secondary | ICD-10-CM | POA: Diagnosis not present

## 2020-04-05 DIAGNOSIS — G35 Multiple sclerosis: Secondary | ICD-10-CM | POA: Diagnosis not present

## 2020-04-05 DIAGNOSIS — M1990 Unspecified osteoarthritis, unspecified site: Secondary | ICD-10-CM | POA: Diagnosis not present

## 2020-04-05 DIAGNOSIS — G629 Polyneuropathy, unspecified: Secondary | ICD-10-CM | POA: Diagnosis not present

## 2020-04-05 DIAGNOSIS — G959 Disease of spinal cord, unspecified: Secondary | ICD-10-CM | POA: Diagnosis not present

## 2020-04-05 DIAGNOSIS — D649 Anemia, unspecified: Secondary | ICD-10-CM | POA: Diagnosis not present

## 2020-04-05 DIAGNOSIS — I10 Essential (primary) hypertension: Secondary | ICD-10-CM | POA: Diagnosis not present

## 2020-04-05 NOTE — Telephone Encounter (Signed)
Called patient and discussed the above.   Patient would like to know if they could schedule a time to come in for a tutorial on how to assemble and use the Forteo pen and pen needles.

## 2020-04-05 NOTE — Telephone Encounter (Signed)
Patient and husband requested to come to clinic to walk through [facility] again. Pen needles have already shipped and are expected to arrive to her home on Tuesday. Will re-schedule Forteo to be shipped to clinic. Patient and husband scheduled to be seen by pharmacist on 04/11/20 @ 9am.

## 2020-04-06 ENCOUNTER — Telehealth: Payer: Self-pay | Admitting: Rheumatology

## 2020-04-06 DIAGNOSIS — S129XXA Fracture of neck, unspecified, initial encounter: Secondary | ICD-10-CM | POA: Diagnosis not present

## 2020-04-06 DIAGNOSIS — T84296D Other mechanical complication of internal fixation device of vertebrae, subsequent encounter: Secondary | ICD-10-CM | POA: Diagnosis not present

## 2020-04-06 DIAGNOSIS — G959 Disease of spinal cord, unspecified: Secondary | ICD-10-CM | POA: Diagnosis not present

## 2020-04-06 DIAGNOSIS — I10 Essential (primary) hypertension: Secondary | ICD-10-CM | POA: Diagnosis not present

## 2020-04-06 DIAGNOSIS — G629 Polyneuropathy, unspecified: Secondary | ICD-10-CM | POA: Diagnosis not present

## 2020-04-06 DIAGNOSIS — G35 Multiple sclerosis: Secondary | ICD-10-CM | POA: Diagnosis not present

## 2020-04-06 DIAGNOSIS — M0609 Rheumatoid arthritis without rheumatoid factor, multiple sites: Secondary | ICD-10-CM | POA: Diagnosis not present

## 2020-04-06 DIAGNOSIS — M8008XD Age-related osteoporosis with current pathological fracture, vertebra(e), subsequent encounter for fracture with routine healing: Secondary | ICD-10-CM | POA: Diagnosis not present

## 2020-04-06 DIAGNOSIS — M1990 Unspecified osteoarthritis, unspecified site: Secondary | ICD-10-CM | POA: Diagnosis not present

## 2020-04-06 DIAGNOSIS — D649 Anemia, unspecified: Secondary | ICD-10-CM | POA: Diagnosis not present

## 2020-04-06 NOTE — Telephone Encounter (Signed)
Please encourage the patient to reach out to PCP to see if they can call to try to schedule a sooner appointment for the patient.  She is about start on forteo, so ideally we would like her DEXA ASAP.

## 2020-04-06 NOTE — Telephone Encounter (Signed)
Patient advised to reach out to PCP to see if they can call to try to schedule a sooner appointment for the patient.  She is about start on forteo, so ideally we would like her DEXA ASAP.  Patient expressed understanding.

## 2020-04-06 NOTE — Telephone Encounter (Signed)
Patient's husband calling. Patient spoke with PCP about Bone Density scan, and first available was in June. Patient declined appointment because it was so far out. Please call to advise.

## 2020-04-09 MED FILL — FORTEO 600 MCG/2.4 ML PEN I: 620 | 28 days supply | Qty: 2 | Fill #0

## 2020-04-10 DIAGNOSIS — I10 Essential (primary) hypertension: Secondary | ICD-10-CM | POA: Diagnosis not present

## 2020-04-10 DIAGNOSIS — M1990 Unspecified osteoarthritis, unspecified site: Secondary | ICD-10-CM | POA: Diagnosis not present

## 2020-04-10 DIAGNOSIS — G629 Polyneuropathy, unspecified: Secondary | ICD-10-CM | POA: Diagnosis not present

## 2020-04-10 DIAGNOSIS — M0609 Rheumatoid arthritis without rheumatoid factor, multiple sites: Secondary | ICD-10-CM | POA: Diagnosis not present

## 2020-04-10 DIAGNOSIS — T84296D Other mechanical complication of internal fixation device of vertebrae, subsequent encounter: Secondary | ICD-10-CM | POA: Diagnosis not present

## 2020-04-10 DIAGNOSIS — G959 Disease of spinal cord, unspecified: Secondary | ICD-10-CM | POA: Diagnosis not present

## 2020-04-10 DIAGNOSIS — G35 Multiple sclerosis: Secondary | ICD-10-CM | POA: Diagnosis not present

## 2020-04-10 DIAGNOSIS — M8008XD Age-related osteoporosis with current pathological fracture, vertebra(e), subsequent encounter for fracture with routine healing: Secondary | ICD-10-CM | POA: Diagnosis not present

## 2020-04-10 DIAGNOSIS — D649 Anemia, unspecified: Secondary | ICD-10-CM | POA: Diagnosis not present

## 2020-04-11 ENCOUNTER — Ambulatory Visit: Payer: Medicare PPO | Admitting: Pharmacist

## 2020-04-11 ENCOUNTER — Other Ambulatory Visit: Payer: Self-pay

## 2020-04-11 DIAGNOSIS — M8589 Other specified disorders of bone density and structure, multiple sites: Secondary | ICD-10-CM

## 2020-04-11 DIAGNOSIS — S129XXS Fracture of neck, unspecified, sequela: Secondary | ICD-10-CM

## 2020-04-11 NOTE — Progress Notes (Signed)
Pharmacy Note  Subjective:   Patient presents to clinic today to receive first dose of Forteo at the request of her and her husband, Michele Bryan. Patient currently take Orencia for rheumatoid arthritis and is comfortable with self-administration of mdeication.  Patient running a fever or have signs/symptoms of infection? No  Objective: CMP     Component Value Date/Time   NA 139 04/03/2020 1030   K 4.6 04/03/2020 1030   CL 102 04/03/2020 1030   CO2 28 04/03/2020 1030   GLUCOSE 78 04/03/2020 1030   BUN 14 04/03/2020 1030   CREATININE 0.67 04/03/2020 1030   CALCIUM 9.5 04/03/2020 1030   PROT 6.5 04/03/2020 1030   ALBUMIN 2.9 (L) 02/22/2020 0435   AST 12 04/03/2020 1030   ALT 8 04/03/2020 1030   ALKPHOS 92 02/22/2020 0435   BILITOT 0.7 04/03/2020 1030   GFRNONAA 85 04/03/2020 1030   GFRAA 98 04/03/2020 1030    CBC    Component Value Date/Time   WBC 5.3 04/03/2020 1030   RBC 4.03 04/03/2020 1030   HGB 11.2 (L) 04/03/2020 1030   HCT 34.6 (L) 04/03/2020 1030   PLT 269 04/03/2020 1030   MCV 85.9 04/03/2020 1030   MCH 27.8 04/03/2020 1030   MCHC 32.4 04/03/2020 1030   RDW 15.2 (H) 04/03/2020 1030   LYMPHSABS 1,950 04/03/2020 1030   MONOABS 0.7 02/29/2020 0542   EOSABS 111 04/03/2020 1030   BASOSABS 42 04/03/2020 1030   Vitamin D  Lab Results  Component Value Date   VD25OH 30.65 02/20/2020   DEXA scan, previous DEXA scan T-score was minus -2.1. due to repeat DEXA ordered by PCP.   Assessment/Plan:   Counseled patient on purpose, proper use, storage, and adverse effects of Forteo.  Discussed that pen is good for 28 days and must be stored in fridge after each dose. Counseled patient that Sharren Bridge is a medication that must be injected once daily and that prescription for pen needles will be sent with Forteo prescription.  Advised patient to continue taking calcium 1200 mg daily and vitamin D 5000 units daily.  Reviewed the most common adverse effects of Forteo including  orthostatic hypotension, GI upset, injection site reaction, and joint pain/arthralgia.  Discussed injection site reaction management and injection site locations. Discussed alternating injection site and that there is no significant drug interaction with her current RA and all other medications.  Discussed management of orthostatic hypotension including adequate hydration, slowly rising from bed/chairs to prevent fals, and taking Forteo at night if needed.  We walked through [employer] administration with demo pen and pen needle.  Discussed appropriate disposal of sharps.   Demonstrated proper injection technique with Forteo demo device and pen needles.  Patient able to demonstrate proper injection technique using the teach back method.  Patient self injected in the right thigh with Forteo that was delivered from [facility]:  Patient tolerated well and demonstrated effective administration skills.  Forteo approved through insurance - copay is $100 per month.  Prescription sent to [facility].  All questions encouraged and answered.  Instructed patient and husband to call with any further questions or concerns.  Chesley Mires, PharmD, MPH Clinical Pharmacist (Rheumatology and Pulmonology)  04/11/2020 8:28 AM

## 2020-04-13 ENCOUNTER — Other Ambulatory Visit (HOSPITAL_BASED_OUTPATIENT_CLINIC_OR_DEPARTMENT_OTHER): Payer: Self-pay | Admitting: Neurological Surgery

## 2020-04-13 DIAGNOSIS — S129XXA Fracture of neck, unspecified, initial encounter: Secondary | ICD-10-CM

## 2020-04-17 DIAGNOSIS — M1990 Unspecified osteoarthritis, unspecified site: Secondary | ICD-10-CM | POA: Diagnosis not present

## 2020-04-17 DIAGNOSIS — M0609 Rheumatoid arthritis without rheumatoid factor, multiple sites: Secondary | ICD-10-CM | POA: Diagnosis not present

## 2020-04-17 DIAGNOSIS — M8008XD Age-related osteoporosis with current pathological fracture, vertebra(e), subsequent encounter for fracture with routine healing: Secondary | ICD-10-CM | POA: Diagnosis not present

## 2020-04-17 DIAGNOSIS — I10 Essential (primary) hypertension: Secondary | ICD-10-CM | POA: Diagnosis not present

## 2020-04-17 DIAGNOSIS — D649 Anemia, unspecified: Secondary | ICD-10-CM | POA: Diagnosis not present

## 2020-04-17 DIAGNOSIS — G629 Polyneuropathy, unspecified: Secondary | ICD-10-CM | POA: Diagnosis not present

## 2020-04-17 DIAGNOSIS — T84296D Other mechanical complication of internal fixation device of vertebrae, subsequent encounter: Secondary | ICD-10-CM | POA: Diagnosis not present

## 2020-04-17 DIAGNOSIS — G35 Multiple sclerosis: Secondary | ICD-10-CM | POA: Diagnosis not present

## 2020-04-17 DIAGNOSIS — G959 Disease of spinal cord, unspecified: Secondary | ICD-10-CM | POA: Diagnosis not present

## 2020-04-19 ENCOUNTER — Encounter: Payer: Self-pay | Admitting: Physical Medicine & Rehabilitation

## 2020-04-19 ENCOUNTER — Other Ambulatory Visit: Payer: Self-pay

## 2020-04-19 ENCOUNTER — Encounter: Payer: Medicare PPO | Attending: Physical Medicine & Rehabilitation | Admitting: Physical Medicine & Rehabilitation

## 2020-04-19 VITALS — BP 158/83 | HR 91 | Temp 98.1°F | Ht 63.0 in | Wt 114.2 lb

## 2020-04-19 DIAGNOSIS — R269 Unspecified abnormalities of gait and mobility: Secondary | ICD-10-CM | POA: Diagnosis not present

## 2020-04-19 DIAGNOSIS — M0579 Rheumatoid arthritis with rheumatoid factor of multiple sites without organ or systems involvement: Secondary | ICD-10-CM | POA: Diagnosis not present

## 2020-04-19 DIAGNOSIS — K5901 Slow transit constipation: Secondary | ICD-10-CM | POA: Diagnosis not present

## 2020-04-19 DIAGNOSIS — G8918 Other acute postprocedural pain: Secondary | ICD-10-CM | POA: Diagnosis not present

## 2020-04-19 DIAGNOSIS — G959 Disease of spinal cord, unspecified: Secondary | ICD-10-CM | POA: Diagnosis not present

## 2020-04-19 MED ORDER — AMITRIPTYLINE HCL 50 MG PO TABS: 50.0000 mg | ORAL_TABLET | Freq: Every day | ORAL | 1 refills | Status: DC

## 2020-04-19 NOTE — Progress Notes (Signed)
Subjective:    Patient ID: Michele Bryan, female    DOB: [birth year], 80 y.o.   MRN: [id number]  HPI Female with history of HTN, MS-last flare 3 years ago, RA, anemia, fall earlier this year and found ot have C/C2 fracture with displacement requiring fusion on 12/06/19 presents for hospital follow up for unstable C1/2 fx with deformity s/p HALO and C1-C5 posterior fusion.  Husband supplements history. Last clinic visit on 03/08/20.  Since that time, pt is still in therapies.  She continues to follow up with Neurosurg, with plans for imaging next week.  BP is relatively controlled at home, slightly elevated today. She saw Rheum and restarted her meds. She continues to take Miralax prn. She had some benefit with increase in Elavil.  Denies falls. She is using cane.   Pain Inventory Average Pain 7 Pain Right Now 9 My pain is intermittent, sharp, stabbing and aching  In the last 24 hours, has pain interfered with the following? General activity 5 Relation with others 4 Enjoyment of life 9 What TIME of day is your pain at its worst? morning  Sleep (in general) good  Pain is worse with: standing Pain improves with: medication and heat Relief from Meds: 8   Family History  Problem Relation Age of Onset  . Heart attack Father   . Breast cancer Neg Hx    Social History   Socioeconomic History  . Marital status: Married    Spouse name: [name]  . Number of children: 3  . Years of education: college  . Highest education level: Not on file  Occupational History    Comment: retired  Tobacco Use  . Smoking status: Never Smoker  . Smokeless tobacco: Never Used  Vaping Use  . Vaping Use: Never used  Substance and Sexual Activity  . Alcohol use: No  . Drug use: Never  . Sexual activity: Not on file  Other Topics Concern  . Not on file  Social History Narrative   Patient lives at home with her husband [name]).   Retired.   Education- college.   Caffeine- Two cups of decaf tea daily.    Right handed.         Social Determinants of Health   Financial Resource Strain: Not on file  Food Insecurity: Not on file  Transportation Needs: Not on file  Physical Activity: Not on file  Stress: Not on file  Social Connections: Not on file   Past Surgical History:  Procedure Laterality Date  . APPLICATION OF INTRAOPERATIVE CT SCAN N/A 02/16/2020   Procedure: APPLICATION OF INTRAOPERATIVE CT SCAN;  Surgeon: Jadene Pierini, MD;  Location: [facility] OR;  Service: Neurosurgery;  Laterality: N/A;  . AUGMENTATION MAMMAPLASTY    . BACK SURGERY    . breast augumentation    . BREAST SURGERY     biopsy  . COLONOSCOPY    . DILATION AND CURETTAGE OF UTERUS    . EYE SURGERY     both eys,cataracts  . goiter    . HALO APPLICATION N/A 02/16/2020   Procedure: HALO TRACTION APPLICATION;  Surgeon: Jadene Pierini, MD;  Location: [facility] OR;  Service: Neurosurgery;  Laterality: N/A;  . JOINT REPLACEMENT    . KNEE ARTHROSCOPY Right   . ORIF SHOULDER FRACTURE Left 03/05/2018   Procedure: OPEN REDUCTION INTERNAL FIXATION (ORIF) LEFT SCAPULA;  Surgeon: Beverely Low, MD;  Location: [facility] OR;  Service: Orthopedics;  Laterality: Left;  . POSTERIOR CERVICAL FUSION/FORAMINOTOMY N/A 12/06/2019  Procedure: Cervical one-two Posterior instrumented fusion;  Surgeon: Jadene Pierini, MD;  Location: [facility] OR;  Service: Neurosurgery;  Laterality: N/A;  . POSTERIOR CERVICAL FUSION/FORAMINOTOMY N/A 02/13/2020   Procedure: Revision of cervical instrumented fusion with Occiput to Cervical Four posterior instrumented fusion;  Surgeon: Jadene Pierini, MD;  Location: [facility] OR;  Service: Neurosurgery;  Laterality: N/A;  posterior  . POSTERIOR CERVICAL FUSION/FORAMINOTOMY N/A 02/16/2020   Procedure: Occiput to Thoracic Two Posterior Cervical Fusion with AIRO and Application of Halo;  Surgeon: Jadene Pierini, MD;  Location: [facility] OR;  Service: Neurosurgery;  Laterality: N/A;  . REVERSE SHOULDER ARTHROPLASTY Left  05/15/2017   Procedure: LEFT REVERSE SHOULDER ARTHROPLASTY;  Surgeon: Beverely Low, MD;  Location: [facility] OR;  Service: Orthopedics;  Laterality: Left;  . REVERSE SHOULDER ARTHROPLASTY Left 10/11/2018   Procedure: left shoulder irrigation and debridement, open poly exchange and removal of painful hardware;  Surgeon: Beverely Low, MD;  Location: [facility] ORS;  Service: Orthopedics;  Laterality: Left;  . THYROID LOBECTOMY    . TOTAL KNEE ARTHROPLASTY Right 06/15/2012   Procedure: RIGHT TOTAL KNEE ARTHROPLASTY;  Surgeon: [name], MD;  Location: [facility] ORS;  Service: Orthopedics;  Laterality: Right;  . WRIST SURGERY Left    Past Medical History:  Diagnosis Date  . Anemia   . Arthritis   . GERD (gastroesophageal reflux disease)   . History of blood transfusion   . Hyperlipidemia   . Hypertension   . MS (multiple sclerosis) (HCC)   . Neuromuscular disorder (HCC)    multiple scleroosis/peripheral neuropathy  . PONV (postoperative nausea and vomiting)   . Rheumatoid arthritis (HCC)   . Ulcer   . Vitamin D deficiency    BP (!) 158/83   Pulse 91   Temp 98.1 F (36.7 C)   Ht 5\' 3"  (1.6 m)   Wt 114 lb 3.2 oz (51.8 kg)   SpO2 98%   BMI 20.23 kg/m   Opioid Risk Score:   Fall Risk Score:  `1  Depression screen PHQ 2/9  Depression screen [facility] 2/9 04/19/2020 03/08/2020  Decreased Interest 0 0  Down, Depressed, Hopeless 0 0  PHQ - 2 Score 0 0  Altered sleeping - 3  Tired, decreased energy - 1  Change in appetite - 0  Feeling bad or failure about yourself  - 0  Trouble concentrating - 0  Moving slowly or fidgety/restless - 0  Suicidal thoughts - 0  PHQ-9 Score - 4  Difficult doing work/chores - Not difficult at all    Review of Systems  Musculoskeletal: Positive for arthralgias, gait problem, myalgias and neck stiffness.       Arm pain  Neurological: Positive for weakness and numbness. Negative for headaches.  All other systems reviewed and are negative.     Objective:   Physical Exam   Constitutional: No distress . Vital signs reviewed. HENT: Normocephalic.  Atraumatic. +HALO, looks to have shifted slightly. Eyes: EOMI. No discharge. Cardiovascular: No JVD.   Respiratory: Normal effort.  No stridor.   GI: Non-distended.   Skin: Warm and dry.  Intact. Psych: Normal mood.  Normal behavior. Musc: Ulnar deviation of digits, stable Intrinsic hand atrophy, stable Neuro: Alert Motor: B/l UE: 4+/5 proximal to distal, improving B/l LE: HF, KE 4+/5, ADF 5/5, right slightly weaker than left, improving    Assessment & Plan:  Female with history of HTN, MS-last flare 3 years ago, RA, anemia, fall earlier this year and found ot have C/C2 fracture with  displacement requiring fusion on 12/06/19 presents for hospital follow up for unstable C1/2 fx with deformity s/p HALO and C1-C5 posterior fusion.  1.  Unstable C1/2 fx with deformity s/p HALO and C1-C5 posterior fusion and resulting mild weakness secondary to previous falls with impaired ADLs/mobility  Continue therapies  Continue to follow up with Neurosurg  2.  HTN:   Cont meds  Elevated today, however, controlled at home  3. RA multiple joints  Continue Rheum, meds being adjusted  4. H/o colitis and chronic intermittent LLQ pain/drug-induced constipation:             Continue Miralax, encouraged daily usage  5.  Sleep disturbance:   Limited by HALO  Will increase Elavil to 50mg  HS  6. Gait abnormality  Cont cane/walker for safety  Continue therapies

## 2020-04-24 DIAGNOSIS — T84296D Other mechanical complication of internal fixation device of vertebrae, subsequent encounter: Secondary | ICD-10-CM | POA: Diagnosis not present

## 2020-04-24 DIAGNOSIS — M1990 Unspecified osteoarthritis, unspecified site: Secondary | ICD-10-CM | POA: Diagnosis not present

## 2020-04-24 DIAGNOSIS — I10 Essential (primary) hypertension: Secondary | ICD-10-CM | POA: Diagnosis not present

## 2020-04-24 DIAGNOSIS — G629 Polyneuropathy, unspecified: Secondary | ICD-10-CM | POA: Diagnosis not present

## 2020-04-24 DIAGNOSIS — M8008XD Age-related osteoporosis with current pathological fracture, vertebra(e), subsequent encounter for fracture with routine healing: Secondary | ICD-10-CM | POA: Diagnosis not present

## 2020-04-24 DIAGNOSIS — M0609 Rheumatoid arthritis without rheumatoid factor, multiple sites: Secondary | ICD-10-CM | POA: Diagnosis not present

## 2020-04-24 DIAGNOSIS — G35 Multiple sclerosis: Secondary | ICD-10-CM | POA: Diagnosis not present

## 2020-04-24 DIAGNOSIS — D649 Anemia, unspecified: Secondary | ICD-10-CM | POA: Diagnosis not present

## 2020-04-24 DIAGNOSIS — G959 Disease of spinal cord, unspecified: Secondary | ICD-10-CM | POA: Diagnosis not present

## 2020-04-24 MED FILL — ORENCIA 125 MG/ML SYRINGE: 125 | 28 days supply | Qty: 4 | Fill #2

## 2020-04-27 ENCOUNTER — Ambulatory Visit (HOSPITAL_BASED_OUTPATIENT_CLINIC_OR_DEPARTMENT_OTHER)
Admission: RE | Admit: 2020-04-27 | Discharge: 2020-04-27 | Disposition: A | Discharge status: Home / Self Care | Payer: Medicare PPO | Source: Ambulatory Visit | Attending: Neurological Surgery | Admitting: Neurological Surgery

## 2020-04-27 ENCOUNTER — Other Ambulatory Visit: Payer: Self-pay

## 2020-04-27 DIAGNOSIS — M4319 Spondylolisthesis, multiple sites in spine: Secondary | ICD-10-CM | POA: Diagnosis not present

## 2020-04-27 DIAGNOSIS — S129XXA Fracture of neck, unspecified, initial encounter: Secondary | ICD-10-CM | POA: Insufficient documentation

## 2020-04-27 DIAGNOSIS — Z981 Arthrodesis status: Secondary | ICD-10-CM | POA: Diagnosis not present

## 2020-04-27 DIAGNOSIS — S12100A Unspecified displaced fracture of second cervical vertebra, initial encounter for closed fracture: Secondary | ICD-10-CM | POA: Diagnosis not present

## 2020-04-27 DIAGNOSIS — M4322 Fusion of spine, cervical region: Secondary | ICD-10-CM | POA: Diagnosis not present

## 2020-05-03 DIAGNOSIS — M8008XD Age-related osteoporosis with current pathological fracture, vertebra(e), subsequent encounter for fracture with routine healing: Secondary | ICD-10-CM | POA: Diagnosis not present

## 2020-05-03 DIAGNOSIS — M1990 Unspecified osteoarthritis, unspecified site: Secondary | ICD-10-CM | POA: Diagnosis not present

## 2020-05-03 DIAGNOSIS — G35 Multiple sclerosis: Secondary | ICD-10-CM | POA: Diagnosis not present

## 2020-05-03 DIAGNOSIS — M0609 Rheumatoid arthritis without rheumatoid factor, multiple sites: Secondary | ICD-10-CM | POA: Diagnosis not present

## 2020-05-03 DIAGNOSIS — G959 Disease of spinal cord, unspecified: Secondary | ICD-10-CM | POA: Diagnosis not present

## 2020-05-03 DIAGNOSIS — D649 Anemia, unspecified: Secondary | ICD-10-CM | POA: Diagnosis not present

## 2020-05-03 DIAGNOSIS — T84296D Other mechanical complication of internal fixation device of vertebrae, subsequent encounter: Secondary | ICD-10-CM | POA: Diagnosis not present

## 2020-05-03 DIAGNOSIS — G629 Polyneuropathy, unspecified: Secondary | ICD-10-CM | POA: Diagnosis not present

## 2020-05-03 DIAGNOSIS — I10 Essential (primary) hypertension: Secondary | ICD-10-CM | POA: Diagnosis not present

## 2020-05-07 DIAGNOSIS — B372 Candidiasis of skin and nail: Secondary | ICD-10-CM | POA: Diagnosis not present

## 2020-05-07 DIAGNOSIS — M25512 Pain in left shoulder: Secondary | ICD-10-CM | POA: Diagnosis not present

## 2020-05-07 DIAGNOSIS — R1032 Left lower quadrant pain: Secondary | ICD-10-CM | POA: Diagnosis not present

## 2020-05-07 DIAGNOSIS — K5903 Drug induced constipation: Secondary | ICD-10-CM | POA: Diagnosis not present

## 2020-05-07 MED FILL — FORTEO 600 MCG/2.4 ML PEN I: 620 | 28 days supply | Qty: 2 | Fill #1

## 2020-05-18 DIAGNOSIS — S129XXA Fracture of neck, unspecified, initial encounter: Secondary | ICD-10-CM | POA: Diagnosis not present

## 2020-05-21 ENCOUNTER — Other Ambulatory Visit: Payer: Self-pay | Admitting: Physician Assistant

## 2020-05-21 DIAGNOSIS — E559 Vitamin D deficiency, unspecified: Secondary | ICD-10-CM | POA: Diagnosis not present

## 2020-05-21 DIAGNOSIS — E7800 Pure hypercholesterolemia, unspecified: Secondary | ICD-10-CM | POA: Diagnosis not present

## 2020-05-21 DIAGNOSIS — D649 Anemia, unspecified: Secondary | ICD-10-CM | POA: Diagnosis not present

## 2020-05-21 NOTE — Telephone Encounter (Signed)
Next Visit: 09/05/2020  Last Visit: 04/03/2020  Last Fill: 10/25/2019   DX: Rheumatoid arthritis involving multiple sites with positive rheumatoid factor   Current Dose per office note 04/03/2020, Orencia 125 mg subq injection once weekly   Labs: 04/03/2020, Hgb and hct remain slightly low but are improving. Rest of CBC WNL. CMP WNL.  TB Gold: 01/03/2020, negative  Okay to refill Orencia?

## 2020-05-28 ENCOUNTER — Other Ambulatory Visit: Payer: Self-pay

## 2020-05-28 ENCOUNTER — Encounter: Payer: Medicare PPO | Attending: Physical Medicine & Rehabilitation | Admitting: Physical Medicine & Rehabilitation

## 2020-05-28 ENCOUNTER — Encounter: Payer: Self-pay | Admitting: Physical Medicine & Rehabilitation

## 2020-05-28 VITALS — BP 167/75 | HR 73 | Temp 97.7°F | Ht 63.0 in | Wt 107.8 lb

## 2020-05-28 DIAGNOSIS — G959 Disease of spinal cord, unspecified: Secondary | ICD-10-CM | POA: Diagnosis not present

## 2020-05-28 DIAGNOSIS — I10 Essential (primary) hypertension: Secondary | ICD-10-CM | POA: Diagnosis not present

## 2020-05-28 DIAGNOSIS — R269 Unspecified abnormalities of gait and mobility: Secondary | ICD-10-CM

## 2020-05-28 DIAGNOSIS — G8918 Other acute postprocedural pain: Secondary | ICD-10-CM | POA: Diagnosis not present

## 2020-05-28 DIAGNOSIS — K5901 Slow transit constipation: Secondary | ICD-10-CM

## 2020-05-28 NOTE — Progress Notes (Signed)
Subjective:    Patient ID: Michele Bryan, female    DOB: 1940-06-06, 80 y.o.   MRN: [id number]  HPI Female with history of HTN, MS-last flare 3 years ago, RA, anemia, fall earlier this year and found ot have C/C2 fracture with displacement requiring fusion on 12/06/19 presents for hospital follow up for unstable C1/2 fx with deformity s/p HALO and C1-C5 posterior fusion.  Husband supplements history. Last clinic visit on 04/19/20.  Since that time, pt had her HALO removed.  She wore a hard collar for 2 weeks and now soft collar intermittently.  She completed therapies. She continues to follow up with Neurosurg. BP is elevated today, but controlled at home. She remains constipated. She had improvement with increase in Elavil.  Denies falls.  Pain Inventory Average Pain 6 Pain Right Now 2 My pain is intermittent, sharp, stabbing and aching  In the last 24 hours, has pain interfered with the following? General activity 6 Relation with others 4 Enjoyment of life 6 What TIME of day is your pain at its worst? evening Sleep (in general) good  Pain is worse with: inactivity, standing and some activites Pain improves with: medication Relief from Meds: 6   Family History  Problem Relation Age of Onset  . Heart attack Father   . Breast cancer Neg Hx    Social History   Socioeconomic History  . Marital status: Married    Spouse name: [name]  . Number of children: 3  . Years of education: college  . Highest education level: Not on file  Occupational History    Comment: retired  Tobacco Use  . Smoking status: Never Smoker  . Smokeless tobacco: Never Used  Vaping Use  . Vaping Use: Never used  Substance and Sexual Activity  . Alcohol use: No  . Drug use: Never  . Sexual activity: Not on file  Other Topics Concern  . Not on file  Social History Narrative   Patient lives at home with her husband [name]).   Retired.   Education- college.   Caffeine- Two cups of decaf tea daily.    Right handed.         Social Determinants of Health   Financial Resource Strain: Not on file  Food Insecurity: Not on file  Transportation Needs: Not on file  Physical Activity: Not on file  Stress: Not on file  Social Connections: Not on file   Past Surgical History:  Procedure Laterality Date  . APPLICATION OF INTRAOPERATIVE CT SCAN N/A 02/16/2020   Procedure: APPLICATION OF INTRAOPERATIVE CT SCAN;  Surgeon: Jadene Pierini, MD;  Location: [facility] OR;  Service: Neurosurgery;  Laterality: N/A;  . AUGMENTATION MAMMAPLASTY    . BACK SURGERY    . breast augumentation    . BREAST SURGERY     biopsy  . COLONOSCOPY    . DILATION AND CURETTAGE OF UTERUS    . EYE SURGERY     both eys,cataracts  . goiter    . HALO APPLICATION N/A 02/16/2020   Procedure: HALO TRACTION APPLICATION;  Surgeon: Jadene Pierini, MD;  Location: [facility] OR;  Service: Neurosurgery;  Laterality: N/A;  . JOINT REPLACEMENT    . KNEE ARTHROSCOPY Right   . ORIF SHOULDER FRACTURE Left 03/05/2018   Procedure: OPEN REDUCTION INTERNAL FIXATION (ORIF) LEFT SCAPULA;  Surgeon: Beverely Low, MD;  Location: [facility] OR;  Service: Orthopedics;  Laterality: Left;  . POSTERIOR CERVICAL FUSION/FORAMINOTOMY N/A 12/06/2019   Procedure: Cervical one-two Posterior instrumented  fusion;  Surgeon: Jadene Pierini, MD;  Location: [facility] OR;  Service: Neurosurgery;  Laterality: N/A;  . POSTERIOR CERVICAL FUSION/FORAMINOTOMY N/A 02/13/2020   Procedure: Revision of cervical instrumented fusion with Occiput to Cervical Four posterior instrumented fusion;  Surgeon: Jadene Pierini, MD;  Location: [facility] OR;  Service: Neurosurgery;  Laterality: N/A;  posterior  . POSTERIOR CERVICAL FUSION/FORAMINOTOMY N/A 02/16/2020   Procedure: Occiput to Thoracic Two Posterior Cervical Fusion with AIRO and Application of Halo;  Surgeon: Jadene Pierini, MD;  Location: [facility] OR;  Service: Neurosurgery;  Laterality: N/A;  . REVERSE SHOULDER ARTHROPLASTY Left  05/15/2017   Procedure: LEFT REVERSE SHOULDER ARTHROPLASTY;  Surgeon: Beverely Low, MD;  Location: [facility] OR;  Service: Orthopedics;  Laterality: Left;  . REVERSE SHOULDER ARTHROPLASTY Left 10/11/2018   Procedure: left shoulder irrigation and debridement, open poly exchange and removal of painful hardware;  Surgeon: Beverely Low, MD;  Location: [facility];  Service: Orthopedics;  Laterality: Left;  . THYROID LOBECTOMY    . TOTAL KNEE ARTHROPLASTY Right 06/15/2012   Procedure: RIGHT TOTAL KNEE ARTHROPLASTY;  Surgeon: [name], MD;  Location: [facility];  Service: Orthopedics;  Laterality: Right;  . WRIST SURGERY Left    Past Medical History:  Diagnosis Date  . Anemia   . Arthritis   . GERD (gastroesophageal reflux disease)   . History of blood transfusion   . Hyperlipidemia   . Hypertension   . MS (multiple sclerosis) (HCC)   . Neuromuscular disorder (HCC)    multiple scleroosis/peripheral neuropathy  . PONV (postoperative nausea and vomiting)   . Rheumatoid arthritis (HCC)   . Ulcer   . Vitamin D deficiency    BP (!) 167/75   Pulse 73   Temp 97.7 F (36.5 C)   Ht 5\' 3"  (1.6 m)   Wt 107 lb 12.8 oz (48.9 kg)   SpO2 98%   BMI 19.10 kg/m   Opioid Risk Score:   Fall Risk Score:  `1  Depression screen PHQ 2/9  Depression screen [facility] 2/9 04/19/2020 03/08/2020  Decreased Interest 0 0  Down, Depressed, Hopeless 0 0  PHQ - 2 Score 0 0  Altered sleeping - 3  Tired, decreased energy - 1  Change in appetite - 0  Feeling bad or failure about yourself  - 0  Trouble concentrating - 0  Moving slowly or fidgety/restless - 0  Suicidal thoughts - 0  PHQ-9 Score - 4  Difficult doing work/chores - Not difficult at all    Review of Systems  Musculoskeletal: Positive for arthralgias, gait problem, myalgias and neck stiffness.       Arm pain  Neurological: Positive for weakness and numbness. Negative for headaches.  All other systems reviewed and are negative.     Objective:   Physical  Exam  Constitutional: No distress . Vital signs reviewed. HENT: Normocephalic.  Atraumatic. Eyes: EOMI. No discharge. Cardiovascular: No JVD.  RRR. Respiratory: Normal effort.  No stridor.  Bilateral clear to auscultation. GI: Non-distended.  BS +. Skin: Warm and dry.  Intact. Psych: Normal mood.  Normal behavior. Musc: Ulnar deviation of digits, stable Intrinsic hand atrophy Neuro: Alert Motor: B/l UE: 4+/5 proximal to distal, improving B/l LE: HF, KE 4+/5, ADF 5/5, right slightly weaker than left, improving    Assessment & Plan:  Female with history of HTN, MS-last flare 3 years ago, RA, anemia, fall earlier this year and found ot have C/C2 fracture with displacement requiring fusion on 12/06/19 presents for hospital  follow up for unstable C1/2 fx with deformity s/p HALO and C1-C5 posterior fusion.  1.  Unstable C1/2 fx with deformity s/p HALO and C1-C5 posterior fusion and resulting mild weakness secondary to previous falls with impaired ADLs/mobility  Continue HEP, completed HEP  Continue to follow up with Neurosurg  2.  HTN:   Cont meds  Elevated today, however, controlled at home  3. RA multiple joints  Continue recs per Rheum  4. H/o colitis and chronic intermittent LLQ pain/drug-induced constipation:             Continue Miralax BID  Senna - S 1 tab  5.  Sleep disturbance:   Continue Elavil to 50mg  HS  Improving  6. Gait abnormality  Husband assists in community, rolling walker at home  Continue HEP  Pt would like to follow up in 2 months

## 2020-05-29 ENCOUNTER — Other Ambulatory Visit (HOSPITAL_COMMUNITY): Payer: Self-pay

## 2020-05-29 MED FILL — FORTEO 600 MCG/2.4 ML PEN I: 600 | 28 days supply | Qty: 2 | Fill #2

## 2020-05-29 MED FILL — ORENCIA 125 MG/ML SYRINGE: 125 | 28 days supply | Qty: 4 | Fill #0

## 2020-06-07 ENCOUNTER — Other Ambulatory Visit (HOSPITAL_COMMUNITY): Payer: Self-pay

## 2020-06-11 ENCOUNTER — Other Ambulatory Visit (HOSPITAL_COMMUNITY): Payer: Self-pay

## 2020-06-19 ENCOUNTER — Other Ambulatory Visit (HOSPITAL_COMMUNITY): Payer: Self-pay

## 2020-06-19 DIAGNOSIS — N858 Other specified noninflammatory disorders of uterus: Secondary | ICD-10-CM | POA: Diagnosis not present

## 2020-06-19 DIAGNOSIS — R1032 Left lower quadrant pain: Secondary | ICD-10-CM | POA: Diagnosis not present

## 2020-06-19 DIAGNOSIS — N9489 Other specified conditions associated with female genital organs and menstrual cycle: Secondary | ICD-10-CM | POA: Diagnosis not present

## 2020-06-19 DIAGNOSIS — D219 Benign neoplasm of connective and other soft tissue, unspecified: Secondary | ICD-10-CM | POA: Diagnosis not present

## 2020-06-19 DIAGNOSIS — R9389 Abnormal findings on diagnostic imaging of other specified body structures: Secondary | ICD-10-CM | POA: Diagnosis not present

## 2020-06-21 DIAGNOSIS — S129XXA Fracture of neck, unspecified, initial encounter: Secondary | ICD-10-CM | POA: Diagnosis not present

## 2020-06-25 ENCOUNTER — Other Ambulatory Visit (HOSPITAL_COMMUNITY): Payer: Self-pay

## 2020-06-25 MED FILL — Insulin Pen Needle 31 G X 5 MM (1/5" or 3/16"): 100 days supply | Qty: 100 | Fill #0 | Status: AC

## 2020-06-25 MED FILL — Abatacept Subcutaneous Soln Prefilled Syringe 125 MG/ML: SUBCUTANEOUS | 28 days supply | Qty: 4 | Fill #0 | Status: AC

## 2020-06-25 MED FILL — Teriparatide Soln Pen-inj 620 MCG/2.48ML: SUBCUTANEOUS | 28 days supply | Qty: 4.8 | Fill #0 | Status: CN

## 2020-07-02 ENCOUNTER — Other Ambulatory Visit (HOSPITAL_COMMUNITY): Payer: Self-pay

## 2020-07-03 DIAGNOSIS — M25512 Pain in left shoulder: Secondary | ICD-10-CM | POA: Diagnosis not present

## 2020-07-03 DIAGNOSIS — M419 Scoliosis, unspecified: Secondary | ICD-10-CM | POA: Diagnosis not present

## 2020-07-03 DIAGNOSIS — M542 Cervicalgia: Secondary | ICD-10-CM | POA: Diagnosis not present

## 2020-07-05 ENCOUNTER — Other Ambulatory Visit (HOSPITAL_COMMUNITY): Payer: Self-pay

## 2020-07-05 MED FILL — Teriparatide Soln Pen-inj 600 MCG/2.4ML: SUBCUTANEOUS | 28 days supply | Qty: 2.4 | Fill #0 | Status: AC

## 2020-07-16 DIAGNOSIS — K219 Gastro-esophageal reflux disease without esophagitis: Secondary | ICD-10-CM | POA: Diagnosis not present

## 2020-07-16 DIAGNOSIS — I10 Essential (primary) hypertension: Secondary | ICD-10-CM | POA: Diagnosis not present

## 2020-07-16 DIAGNOSIS — R1032 Left lower quadrant pain: Secondary | ICD-10-CM | POA: Diagnosis not present

## 2020-07-16 DIAGNOSIS — Z6820 Body mass index (BMI) 20.0-20.9, adult: Secondary | ICD-10-CM | POA: Diagnosis not present

## 2020-07-16 DIAGNOSIS — R600 Localized edema: Secondary | ICD-10-CM | POA: Diagnosis not present

## 2020-07-26 ENCOUNTER — Other Ambulatory Visit: Payer: Self-pay

## 2020-07-26 ENCOUNTER — Encounter: Payer: Self-pay | Admitting: Physical Medicine & Rehabilitation

## 2020-07-26 ENCOUNTER — Encounter: Payer: Medicare PPO | Attending: Physical Medicine & Rehabilitation | Admitting: Physical Medicine & Rehabilitation

## 2020-07-26 VITALS — BP 130/67 | HR 84 | Temp 98.1°F | Ht 62.0 in | Wt 107.0 lb

## 2020-07-26 DIAGNOSIS — G8918 Other acute postprocedural pain: Secondary | ICD-10-CM | POA: Diagnosis not present

## 2020-07-26 DIAGNOSIS — G959 Disease of spinal cord, unspecified: Secondary | ICD-10-CM | POA: Diagnosis not present

## 2020-07-26 DIAGNOSIS — K5901 Slow transit constipation: Secondary | ICD-10-CM | POA: Insufficient documentation

## 2020-07-26 DIAGNOSIS — R269 Unspecified abnormalities of gait and mobility: Secondary | ICD-10-CM | POA: Diagnosis not present

## 2020-07-26 NOTE — Progress Notes (Signed)
Subjective:    Patient ID: Michele Bryan, female    DOB: Apr 18, 1940, 80 y.o.   MRN: [id number]  HPI Female with history of HTN, MS-last flare 3 years ago, RA, anemia, fall earlier this year and found ot have C/C2 fracture with displacement requiring fusion on 12/06/19 presents for hospital follow up for unstable C1/2 fx with deformity s/p HALO and C1-C5 posterior fusion.  Husband supplements history. Husband supplements history. Last clinic visit on 05/28/20.  Since that time, pt states she is following up with Neurosurg.  She is doing some HEP. BP is controlled. She has trouble with BMs, but is not taking Miralax. Sleep is good.  1 fall, when she turned around very quickly.   Pain Inventory Average Pain 5 Pain Right Now 6 My pain is intermittent, sharp, stabbing and aching  In the last 24 hours, has pain interfered with the following? General activity 6 Relation with others 5 Enjoyment of life 8 What TIME of day is your pain at its worst? evening Sleep (in general) poor w/o medication  Pain is worse with: inactivity, standing and some activites Pain improves with: medication Relief from Meds: 8   Family History  Problem Relation Age of Onset  . Heart attack Father   . Breast cancer Neg Hx    Social History   Socioeconomic History  . Marital status: Married    Spouse name: [name]  . Number of children: 3  . Years of education: college  . Highest education level: Not on file  Occupational History    Comment: retired  Tobacco Use  . Smoking status: Never Smoker  . Smokeless tobacco: Never Used  Vaping Use  . Vaping Use: Never used  Substance and Sexual Activity  . Alcohol use: No  . Drug use: Never  . Sexual activity: Not on file  Other Topics Concern  . Not on file  Social History Narrative   Patient lives at home with her husband [name]).   Retired.   Education- college.   Caffeine- Two cups of decaf tea daily.   Right handed.         Social Determinants of  Health   Financial Resource Strain: Not on file  Food Insecurity: Not on file  Transportation Needs: Not on file  Physical Activity: Not on file  Stress: Not on file  Social Connections: Not on file   Past Surgical History:  Procedure Laterality Date  . APPLICATION OF INTRAOPERATIVE CT SCAN N/A 02/16/2020   Procedure: APPLICATION OF INTRAOPERATIVE CT SCAN;  Surgeon: Jadene Pierini, MD;  Location: [facility] OR;  Service: Neurosurgery;  Laterality: N/A;  . AUGMENTATION MAMMAPLASTY    . BACK SURGERY    . breast augumentation    . BREAST SURGERY     biopsy  . COLONOSCOPY    . DILATION AND CURETTAGE OF UTERUS    . EYE SURGERY     both eys,cataracts  . goiter    . HALO APPLICATION N/A 02/16/2020   Procedure: HALO TRACTION APPLICATION;  Surgeon: Jadene Pierini, MD;  Location: [facility] OR;  Service: Neurosurgery;  Laterality: N/A;  . JOINT REPLACEMENT    . KNEE ARTHROSCOPY Right   . ORIF SHOULDER FRACTURE Left 03/05/2018   Procedure: OPEN REDUCTION INTERNAL FIXATION (ORIF) LEFT SCAPULA;  Surgeon: Beverely Low, MD;  Location: [facility] OR;  Service: Orthopedics;  Laterality: Left;  . POSTERIOR CERVICAL FUSION/FORAMINOTOMY N/A 12/06/2019   Procedure: Cervical one-two Posterior instrumented fusion;  Surgeon: Jadene Pierini,  MD;  Location: [facility] OR;  Service: Neurosurgery;  Laterality: N/A;  . POSTERIOR CERVICAL FUSION/FORAMINOTOMY N/A 02/13/2020   Procedure: Revision of cervical instrumented fusion with Occiput to Cervical Four posterior instrumented fusion;  Surgeon: Jadene Pierini, MD;  Location: [facility] OR;  Service: Neurosurgery;  Laterality: N/A;  posterior  . POSTERIOR CERVICAL FUSION/FORAMINOTOMY N/A 02/16/2020   Procedure: Occiput to Thoracic Two Posterior Cervical Fusion with AIRO and Application of Halo;  Surgeon: Jadene Pierini, MD;  Location: [facility] OR;  Service: Neurosurgery;  Laterality: N/A;  . REVERSE SHOULDER ARTHROPLASTY Left 05/15/2017   Procedure: LEFT REVERSE SHOULDER  ARTHROPLASTY;  Surgeon: Beverely Low, MD;  Location: [facility] OR;  Service: Orthopedics;  Laterality: Left;  . REVERSE SHOULDER ARTHROPLASTY Left 10/11/2018   Procedure: left shoulder irrigation and debridement, open poly exchange and removal of painful hardware;  Surgeon: Beverely Low, MD;  Location: [facility] ORS;  Service: Orthopedics;  Laterality: Left;  . THYROID LOBECTOMY    . TOTAL KNEE ARTHROPLASTY Right 06/15/2012   Procedure: RIGHT TOTAL KNEE ARTHROPLASTY;  Surgeon: [name], MD;  Location: [facility] ORS;  Service: Orthopedics;  Laterality: Right;  . WRIST SURGERY Left    Past Medical History:  Diagnosis Date  . Anemia   . Arthritis   . GERD (gastroesophageal reflux disease)   . History of blood transfusion   . Hyperlipidemia   . Hypertension   . MS (multiple sclerosis) (HCC)   . Neuromuscular disorder (HCC)    multiple scleroosis/peripheral neuropathy  . PONV (postoperative nausea and vomiting)   . Rheumatoid arthritis (HCC)   . Ulcer   . Vitamin D deficiency    BP 130/67   Pulse 84   Temp 98.1 F (36.7 C)   Ht 5\' 2"  (1.575 m)   Wt 107 lb (48.5 kg)   SpO2 99%   BMI 19.57 kg/m   Opioid Risk Score:   Fall Risk Score:  `1  Depression screen PHQ 2/9  Depression screen [facility] 2/9 04/19/2020 03/08/2020  Decreased Interest 0 0  Down, Depressed, Hopeless 0 0  PHQ - 2 Score 0 0  Altered sleeping - 3  Tired, decreased energy - 1  Change in appetite - 0  Feeling bad or failure about yourself  - 0  Trouble concentrating - 0  Moving slowly or fidgety/restless - 0  Suicidal thoughts - 0  PHQ-9 Score - 4  Difficult doing work/chores - Not difficult at all    Review of Systems  Constitutional: Negative.   HENT: Negative.   Eyes: Negative.   Respiratory: Negative.   Cardiovascular: Negative.   Gastrointestinal: Negative.   Endocrine: Negative.   Genitourinary: Negative.   Musculoskeletal: Positive for arthralgias, gait problem, myalgias, neck pain and neck stiffness.       Arm  pain  Allergic/Immunologic: Negative.   Neurological: Positive for weakness and numbness.  All other systems reviewed and are negative.     Objective:   Physical Exam  Constitutional: No distress . Vital signs reviewed. HENT: Normocephalic.  Atraumatic. Eyes: EOMI. No discharge. Cardiovascular: No JVD.   Respiratory: Normal effort.  No stridor.   GI: Non-distended.   Skin: Warm and dry.  Intact. Psych: Normal mood.  Normal behavior. Musc: Ulnar deviation of digits, unchanged Intrinsic hand atrophy, stable Neuro: Alert Motor: B/l UE: 4+/5 proximal to distal, stable B/l LE: HF, KE 4+/5, ADF 5/5, right slightly weaker than left, stable    Assessment & Plan:  Female with history of HTN, MS-last flare 3  years ago, RA, anemia, fall earlier this year and found ot have C/C2 fracture with displacement requiring fusion on 12/06/19 presents for hospital follow up for unstable C1/2 fx with deformity s/p HALO and C1-C5 posterior fusion.  1.  Unstable C1/2 fx with deformity s/p HALO and C1-C5 posterior fusion and resulting mild weakness secondary to previous falls with impaired ADLs/mobility  Continue HEP  Continue to follow up with Neurosurg  2.  HTN:   Continue meds  Controlled at present  3. RA multiple joints  Continue recs per Rheum  4. H/o colitis and chronic intermittent LLQ pain/drug-induced constipation:             Continue Miralax BID, reminded   Senna - S 1 tab  5.  Sleep disturbance:   Continue Elavil to 50mg  HS  Improving with meds  6. Gait abnormality  Husband assists in community, rolling walker at home  Continue HEP

## 2020-07-31 ENCOUNTER — Other Ambulatory Visit (HOSPITAL_COMMUNITY): Payer: Self-pay

## 2020-07-31 MED FILL — Abatacept Subcutaneous Soln Prefilled Syringe 125 MG/ML: SUBCUTANEOUS | 28 days supply | Qty: 4 | Fill #1 | Status: AC

## 2020-07-31 MED FILL — Teriparatide Soln Pen-inj 600 MCG/2.4ML: SUBCUTANEOUS | 28 days supply | Qty: 2.4 | Fill #1 | Status: AC

## 2020-08-03 ENCOUNTER — Other Ambulatory Visit (HOSPITAL_COMMUNITY): Payer: Self-pay

## 2020-08-07 DIAGNOSIS — D219 Benign neoplasm of connective and other soft tissue, unspecified: Secondary | ICD-10-CM | POA: Diagnosis not present

## 2020-08-07 DIAGNOSIS — N9489 Other specified conditions associated with female genital organs and menstrual cycle: Secondary | ICD-10-CM | POA: Diagnosis not present

## 2020-08-07 DIAGNOSIS — R1032 Left lower quadrant pain: Secondary | ICD-10-CM | POA: Diagnosis not present

## 2020-08-07 DIAGNOSIS — N949 Unspecified condition associated with female genital organs and menstrual cycle: Secondary | ICD-10-CM | POA: Diagnosis not present

## 2020-08-10 ENCOUNTER — Telehealth (HOSPITAL_COMMUNITY): Payer: Self-pay | Admitting: Vascular Surgery

## 2020-08-10 NOTE — Telephone Encounter (Signed)
Left pt message giving appt w/ db 6/17 @ 11:20, asked pt to call back to confirm

## 2020-08-17 ENCOUNTER — Other Ambulatory Visit (HOSPITAL_COMMUNITY): Payer: Self-pay | Admitting: *Deleted

## 2020-08-17 ENCOUNTER — Other Ambulatory Visit: Payer: Self-pay

## 2020-08-17 ENCOUNTER — Ambulatory Visit (HOSPITAL_COMMUNITY)
Admission: RE | Admit: 2020-08-17 | Discharge: 2020-08-17 | Disposition: A | Discharge status: Home / Self Care | Payer: Medicare PPO | Source: Ambulatory Visit | Attending: Internal Medicine | Admitting: Internal Medicine

## 2020-08-17 ENCOUNTER — Encounter (HOSPITAL_COMMUNITY): Payer: Self-pay | Admitting: Internal Medicine

## 2020-08-17 ENCOUNTER — Ambulatory Visit (HOSPITAL_BASED_OUTPATIENT_CLINIC_OR_DEPARTMENT_OTHER)
Admission: RE | Admit: 2020-08-17 | Discharge: 2020-08-17 | Disposition: A | Discharge status: Home / Self Care | Payer: Medicare PPO | Source: Ambulatory Visit | Attending: Internal Medicine | Admitting: Internal Medicine

## 2020-08-17 VITALS — BP 128/76 | HR 64 | Wt 109.8 lb

## 2020-08-17 DIAGNOSIS — Z79899 Other long term (current) drug therapy: Secondary | ICD-10-CM | POA: Diagnosis not present

## 2020-08-17 DIAGNOSIS — M069 Rheumatoid arthritis, unspecified: Secondary | ICD-10-CM | POA: Insufficient documentation

## 2020-08-17 DIAGNOSIS — R0600 Dyspnea, unspecified: Secondary | ICD-10-CM

## 2020-08-17 DIAGNOSIS — I358 Other nonrheumatic aortic valve disorders: Secondary | ICD-10-CM | POA: Diagnosis not present

## 2020-08-17 DIAGNOSIS — R600 Localized edema: Secondary | ICD-10-CM | POA: Insufficient documentation

## 2020-08-17 DIAGNOSIS — R609 Edema, unspecified: Secondary | ICD-10-CM

## 2020-08-17 DIAGNOSIS — I359 Nonrheumatic aortic valve disorder, unspecified: Secondary | ICD-10-CM | POA: Diagnosis not present

## 2020-08-17 DIAGNOSIS — Z7901 Long term (current) use of anticoagulants: Secondary | ICD-10-CM | POA: Diagnosis not present

## 2020-08-17 DIAGNOSIS — G35 Multiple sclerosis: Secondary | ICD-10-CM | POA: Diagnosis not present

## 2020-08-17 DIAGNOSIS — I5032 Chronic diastolic (congestive) heart failure: Secondary | ICD-10-CM

## 2020-08-17 DIAGNOSIS — I110 Hypertensive heart disease with heart failure: Secondary | ICD-10-CM | POA: Insufficient documentation

## 2020-08-17 DIAGNOSIS — R0609 Other forms of dyspnea: Secondary | ICD-10-CM

## 2020-08-17 LAB — CBC
HCT: 33.7 % — ABNORMAL LOW (ref 36.0–46.0)
Hemoglobin: 10.3 g/dL — ABNORMAL LOW (ref 12.0–15.0)
MCH: 28.5 pg (ref 26.0–34.0)
MCHC: 30.6 g/dL (ref 30.0–36.0)
MCV: 93.1 fL (ref 80.0–100.0)
Platelets: 220 10*3/uL (ref 150–400)
RBC: 3.62 MIL/uL — ABNORMAL LOW (ref 3.87–5.11)
RDW: 13.5 % (ref 11.5–15.5)
WBC: 4.5 10*3/uL (ref 4.0–10.5)
nRBC: 0 % (ref 0.0–0.2)

## 2020-08-17 LAB — COMPREHENSIVE METABOLIC PANEL
ALT: 12 U/L (ref 0–44)
AST: 16 U/L (ref 15–41)
Albumin: 3.7 g/dL (ref 3.5–5.0)
Alkaline Phosphatase: 124 U/L (ref 38–126)
Anion gap: 7 (ref 5–15)
BUN: 22 mg/dL (ref 8–23)
CO2: 27 mmol/L (ref 22–32)
Calcium: 10.7 mg/dL — ABNORMAL HIGH (ref 8.9–10.3)
Chloride: 104 mmol/L (ref 98–111)
Creatinine, Ser: 0.91 mg/dL (ref 0.44–1.00)
GFR, Estimated: >60 mL/min (ref >60)
Glucose, Bld: 84 mg/dL (ref 70–99)
Potassium: 4.4 mmol/L (ref 3.5–5.1)
Sodium: 138 mmol/L (ref 135–145)
Total Bilirubin: 0.8 mg/dL (ref 0.3–1.2)
Total Protein: 6.1 g/dL — ABNORMAL LOW (ref 6.5–8.1)

## 2020-08-17 LAB — ECHOCARDIOGRAM COMPLETE
AR max vel: 2.47 cm2
AV Area VTI: 2.09 cm2
AV Area mean vel: 2.09 cm2
AV Mean grad: 3 mmHg
AV Peak grad: 4.6 mmHg
Ao pk vel: 1.07 m/s
Area-P 1/2: 4.6 cm2
MV VTI: 1.97 cm2
S' Lateral: 2.6 cm

## 2020-08-17 LAB — BRAIN NATRIURETIC PEPTIDE: B Natriuretic Peptide: 41.7 pg/mL (ref 0.0–100.0)

## 2020-08-17 MED ORDER — FUROSEMIDE 20 MG PO TABS: 20.0000 mg | ORAL_TABLET | Freq: Every day | ORAL | 3 refills | Status: AC | PRN

## 2020-08-17 MED ORDER — POTASSIUM CHLORIDE CRYS ER 20 MEQ PO TBCR: 20.0000 meq | EXTENDED_RELEASE_TABLET | ORAL | 3 refills | Status: AC | PRN

## 2020-08-17 NOTE — Patient Instructions (Signed)
Take Furosemide 20 mg Daily for 3 days then as needed  Take Potassium 20 meq Daily for 3 days and then anytime you take Furosemide  Labs done today, your results will be available in MyChart, we will contact you for abnormal readings.  Please wear your compression hose daily, place them on as soon as you get up in the morning and remove before you go to bed at night.  Your physician has requested that you have a lower or upper extremity venous duplex. This test is an ultrasound of the veins in the legs or arms. It looks at venous blood flow that carries blood from the heart to the legs or arms. Allow one hour for a Lower Venous exam. Allow thirty minutes for an Upper Venous exam. There are no restrictions or special instructions.  Your physician recommends that you schedule a follow-up appointment in: 2-3 months  If you have any questions or concerns before your next appointment please send [place] a message through [place] or call our office at [phone].    TO LEAVE A MESSAGE FOR THE NURSE SELECT OPTION 2, PLEASE LEAVE A MESSAGE INCLUDING: YOUR NAME DATE OF BIRTH CALL BACK NUMBER REASON FOR CALL**this is important as we prioritize the call backs  YOU WILL RECEIVE A CALL BACK THE SAME DAY AS LONG AS YOU CALL BEFORE 4:00 PM  At the [facility], you and your health needs are our priority. As part of our continuing mission to provide you with exceptional heart care, we have created designated Provider Care Teams. These Care Teams include your primary Cardiologist (physician) and Advanced Practice Providers (APPs- Physician Assistants and Nurse Practitioners) who all work together to provide you with the care you need, when you need it.   You may see any of the following providers on your designated Care Team at your next follow up: Dr Arvilla Meres Dr Marca Ancona Dr Brandon Melnick, NP Robbie Lis, [place] Mikki Santee Karle Plumber, PharmD   Please  be sure to bring in all your medications bottles to every appointment.

## 2020-08-17 NOTE — Progress Notes (Signed)
ReDS Vest / Clip - 08/17/20 1300       ReDS Vest / Clip   Station Marker B    Ruler Value 21    ReDS Value Range Low volume    ReDS Actual Value 33

## 2020-08-17 NOTE — Progress Notes (Signed)
[facility] CONSULT NOTE  Referring Physician: Dr. Hyacinth Meeker  Primary Care: Sigmund Hazel, MD Primary Cardiologist: None   HPI:  Michele Bryan is a 80 y.o. female with history of HTN, MS, RA, anemia, previous C1/C2 fracture  Referred by Dr. Hyacinth Meeker for further evaluation of LE edema.   She had fall with C1/C2 fracture in 2021. Had fusion on 12/06/19. 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 Maurice Small in 12/21. Transferred to CIR  Denies any h/o known heart disease. Says she has not struggled with LE edema since she was pregnant. About 3 weeks ago LEs started swelling. No CP or SOB. No orthopnea or PND. Has been taking OTC diuretic.   Echo today EF 60-65% G2DD RV ok. Calcified Aov No AS. Personally reviewed   Review of Systems: [y] = yes, [ ]  = no   General: Weight gain [ ] ; Weight loss [ ] ; Anorexia [ ] ; Fatigue [y]; Fever [ ] ; Chills [ ] ; Weakness [ ]   Cardiac: Chest pain/pressure [ ] ; Resting SOB [ ] ; Exertional SOB [ ] ; Orthopnea [ ] ; Pedal Edema [ y]; Palpitations [ ] ; Syncope [ ] ; Presyncope [ ] ; Paroxysmal nocturnal dyspnea[ ]   Pulmonary: Cough [ ] ; Wheezing[ ] ; Hemoptysis[ ] ; Sputum [ ] ; Snoring [ ]   GI: Vomiting[ ] ; Dysphagia[ ] ; Melena[ ] ; Hematochezia [ ] ; Heartburn[ ] ; Abdominal pain [ ] ; Constipation [ ] ; Diarrhea [ ] ; BRBPR [ ]   GU: Hematuria[ ] ; Dysuria [ ] ; Nocturia[ ]   Vascular: Pain in legs with walking [ ] ; Pain in feet with lying flat [ ] ; Non-healing sores [ ] ; Stroke [ ] ; TIA [ ] ; Slurred speech [ ] ;  Neuro: Headaches[ ] ; Vertigo[ ] ; Seizures[ ] ; Paresthesias[ ] ;Blurred vision [ ] ; Diplopia [ ] ; Vision changes [ ]   Ortho/Skin: Arthritis ]; Joint pain [ y]; Muscle pain [ ] ; Joint swelling [ ] ; Back Pain [ y]; Rash [ ]   Psych: Depression[ ] ; Anxiety[ ]   Heme: Bleeding problems [ ] ; Clotting disorders [ ] ; Anemia [ ]   Endocrine: Diabetes [ ] ;  Thyroid dysfunction[ ]    Past Medical History:  Diagnosis Date   Anemia    Arthritis    GERD (gastroesophageal reflux disease)    History of blood transfusion    Hyperlipidemia    Hypertension    MS (multiple sclerosis) (HCC)    Neuromuscular disorder (HCC)    multiple scleroosis/peripheral neuropathy   PONV (postoperative nausea and vomiting)    Rheumatoid arthritis (HCC)    Ulcer    Vitamin D deficiency     Current Outpatient Medications  Medication Sig Dispense Refill   Abatacept (ORENCIA CLICKJECT) 125 MG/ML SOAJ Inject 125 mg into the skin once a week. 12 mL 0   Abatacept 125 MG/ML SOSY INJECT 125 MG INTO THE SKIN ONCE A WEEK. 4 mL 2   acetaminophen (TYLENOL) 650 MG CR tablet Take 650 mg by mouth every 8 (eight) hours as needed for pain.     alendronate-cholecalciferol (FOSAMAX PLUS D) 70-2800 MG-UNIT tablet Take by mouth.     amitriptyline (ELAVIL) 50 MG tablet Take 1 tablet (50 mg total) by mouth at bedtime. 30 tablet 1   amLODipine (NORVASC) 5 MG tablet Take 1 tablet (5 mg total) by mouth daily. 30 tablet 0   Cholecalciferol (VITAMIN D) 125 MCG (5000 UT) CAPS Take 5,000 Units by mouth daily. 30 capsule 0  Cyanocobalamin (VITAMIN B-12) 5000 MCG TBDP Take 5,000 mcg by mouth daily. 30 tablet 0   docusate sodium (COLACE) 100 MG capsule Take 1 capsule (100 mg total) by mouth daily. 10 capsule 0   gabapentin (NEURONTIN) 100 MG capsule Take 1 capsule (100 mg total) by mouth 3 (three) times daily. 90 capsule 11   hyoscyamine (LEVBID) 0.375 MG 12 hr tablet Take 0.375 mg by mouth every 12 (twelve) hours as needed.     hyoscyamine (LEVSIN) 0.125 MG tablet Take 0.125 mg by mouth every 4 (four) hours as needed.     Insulin Pen Needle (PEN NEEDLES) 31G X 5 MM MISC Use to inject Forteo once daily. DO NOT RE-USE. 100 each 3   Insulin Pen Needle 31G X 5 MM MISC USE TO INJECT FORTEO ONCE DAILY. DO NOT RE-USE. 100 each 3   losartan (COZAAR) 50 MG tablet Take 50 mg by mouth 2 (two) times  daily.     Multiple Vitamin (MULTIVITAMIN WITH MINERALS) TABS Take 1 tablet by mouth 3 (three) times a week.      omeprazole (PRILOSEC) 40 MG capsule Take 1 capsule (40 mg total) by mouth daily. 30 capsule 0   ondansetron (ZOFRAN) 4 MG tablet take one every 6 hours     polyethylene glycol (MIRALAX / GLYCOLAX) 17 g packet Take 17 g by mouth daily. 14 each 0   rosuvastatin (CRESTOR) 5 MG tablet Take by mouth.     Teriparatide, Recombinant, (FORTEO) 600 MCG/2.4ML SOPN Inject 20 mcg into the skin daily. Discard after 28 days 2.4 mL 5   traMADol (ULTRAM) 50 MG tablet Take by mouth every 6 (six) hours as needed.     vitamin E 400 UNIT capsule Take 400 Units by mouth daily.      tiZANidine (ZANAFLEX) 2 MG tablet Take by mouth. (Patient not taking: Reported on 08/17/2020)     No current facility-administered medications for this encounter.    Allergies  Allergen Reactions   Hydrocodone-Acetaminophen     Other reaction(s): extremely sick   Meperidine Hcl     Other reaction(s): sick nausea/vomiting   Codeine Nausea And Vomiting    Other reaction(s): sick/ nausea/vomiting   Demerol [Meperidine] Nausea And Vomiting      Social History   Socioeconomic History   Marital status: Married    Spouse name: [name]   Number of children: 3   Years of education: college   Highest education level: Not on file  Occupational History    Comment: retired  Tobacco Use   Smoking status: Never   Smokeless tobacco: Never  Vaping Use   Vaping Use: Never used  Substance and Sexual Activity   Alcohol use: No   Drug use: Never   Sexual activity: Not on file  Other Topics Concern   Not on file  Social History Narrative   Patient lives at home with her husband [name]).   Retired.   Education- college.   Caffeine- Two cups of decaf tea daily.   Right handed.         Social Determinants of Health   Financial Resource Strain: Not on file  Food Insecurity: Not on file  Transportation Needs: Not on file   Physical Activity: Not on file  Stress: Not on file  Social Connections: Not on file  Intimate Partner Violence: Not on file      Family History  Problem Relation Age of Onset   Heart attack Father    Breast cancer Neg  Hx     Vitals:   08/17/20 1236  BP: 128/76  Pulse: 64  SpO2: 100%  Weight: 49.8 kg (109 lb 12.8 oz)    PHYSICAL EXAM: General:  Elderly No respiratory difficulty HEENT: normal Neck: supple. JVP 7-8 Carotids 2+ bilat; no bruits. No lymphadenopathy or thryomegaly appreciated.  Limited ROM Cor: PMI nondisplaced. Regular rate & rhythm. No rubs, gallops or murmurs. Lungs: clear Abdomen: soft, nontender, nondistended. No hepatosplenomegaly. No bruits or masses. Good bowel sounds. Extremities: no cyanosis, clubbing, rash, 2-3+ edema L  2+ right  Neuro: alert & oriented x 3, cranial nerves grossly intact. moves all 4 extremities w/o difficulty. Affect pleasant.  ECG: NSR 65 No ST-T wave abnormalities. Personally reviewed    ASSESSMENT & PLAN:  Lower extremity edema - Echo today EF 60-65% G2DD RV ok. Calcified Aov No AS. Personally reviewed - ReDS 38% - Suspect mix of diastolic HF and venous insufficiency - Place compression hose.  - Encourage keeping legs up when seated. More ambulation.  - Start lasix 20mg  daily with KCl 20 for 3 days then as needed. - Check BNP - Check LE u/s - check labs - can consider Entresto or SGLT2i in the future - K has been high normal in past so I did not chose spiro today  2. Calcified AoV - no significant AS.  - Will follow   Arvilla Meres, MD  11:08 PM

## 2020-08-17 NOTE — Progress Notes (Signed)
  Echocardiogram 2D Echocardiogram has been performed.  Michele Bryan 08/17/2020, 12:06 PM

## 2020-08-22 NOTE — Progress Notes (Signed)
Office Visit Note  Patient: Michele Bryan             Date of Birth: Nov 15, 1940           MRN: [id number]             PCP: Sigmund Hazel, MD Referring: Sigmund Hazel, MD Visit Date: 09/05/2020 Occupation: @GUAROCC @  Subjective:  Pain and swelling in multiple joints.   History of Present Illness: Michele Bryan is a 80 y.o. female with a history of rheumatoid arthritis and osteoarthritis.  She returns today after her last visit in February 2022.  She is gradually healing from the cervical fracture.  She has limited range of motion of her cervical spine due to fusion.  She states she has been experiencing increased pain and swelling in her hands and her bilateral ankle.  She has been taking Orencia on a regular basis.  She believes she did better when she was on hydroxychloroquine.  She had regular eye examinations without any problems.  She continues to have some discomfort in her back due to scoliosis.  She started on Forteo in February 2022.  Activities of Daily Living:  Patient reports morning stiffness for several hours.   Patient Reports nocturnal pain.  Difficulty dressing/grooming: Reports Difficulty climbing stairs: Reports Difficulty getting out of chair: Reports Difficulty using hands for taps, buttons, cutlery, and/or writing: Reports  Review of Systems  Constitutional:  Positive for fatigue.  HENT:  Negative for mouth sores, mouth dryness and nose dryness.   Eyes:  Negative for pain, itching and dryness.  Respiratory:  Negative for shortness of breath and difficulty breathing.   Cardiovascular:  Positive for swelling in legs/feet. Negative for chest pain and palpitations.  Gastrointestinal:  Positive for constipation and diarrhea. Negative for blood in stool.  Endocrine: Negative for increased urination.  Genitourinary:  Positive for difficulty urinating.  Musculoskeletal:  Positive for joint pain, joint pain, joint swelling, myalgias, morning stiffness, muscle tenderness  and myalgias.  Skin:  Negative for color change, rash and redness.  Allergic/Immunologic: Negative for susceptible to infections.  Neurological:  Positive for memory loss and weakness. Negative for dizziness, numbness and headaches.  Hematological:  Positive for bruising/bleeding tendency.  Psychiatric/Behavioral:  Negative for confusion.    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  05/06/2016   High risk medication use 05/06/2016   Primary osteoarthritis of both hands 05/06/2016   Primary osteoarthritis of left knee 05/06/2016   Primary osteoarthritis of both feet 05/06/2016   Dyslipidemia 05/06/2016   Peripheral neuropathy 05/06/2016   Diverticulosis of intestine without bleeding 05/06/2016   Osteopenia  05/06/2016   Vitamin D deficiency 05/06/2016   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   History of GI bleed 05/06/2016   Cataract of both eyes 05/06/2016   Urinary urgency 04/30/2015   Chronic constipation 04/30/2015   MS (multiple sclerosis) (HCC) 03/23/2013   Expected blood loss anemia 06/16/2012  S/P right TKA 06/15/2012    Past Medical History:  Diagnosis Date   Anemia    Arthritis    GERD (gastroesophageal reflux disease)    History of blood transfusion    Hyperlipidemia    Hypertension    MS (multiple sclerosis) (HCC)    Neuromuscular disorder (HCC)    multiple scleroosis/peripheral neuropathy   PONV (postoperative  nausea and vomiting)    Rheumatoid arthritis (HCC)    Ulcer    Vitamin D deficiency     Family History  Problem Relation Age of Onset   Heart attack Father    Breast cancer Neg Hx    Past Surgical History:  Procedure Laterality Date   APPLICATION OF INTRAOPERATIVE CT SCAN N/A 02/16/2020   Procedure: APPLICATION OF INTRAOPERATIVE CT SCAN;  Surgeon: Jadene Pierini, MD;  Location: [facility] OR;  Service: Neurosurgery;  Laterality: N/A;   AUGMENTATION MAMMAPLASTY     BACK SURGERY     breast augumentation     BREAST SURGERY     biopsy   COLONOSCOPY     DILATION AND CURETTAGE OF UTERUS     EYE SURGERY     both eys,cataracts   goiter     HALO APPLICATION N/A 02/16/2020   Procedure: HALO TRACTION APPLICATION;  Surgeon: Jadene Pierini, MD;  Location: [facility] OR;  Service: Neurosurgery;  Laterality: N/A;   JOINT REPLACEMENT     KNEE ARTHROSCOPY Right    ORIF SHOULDER FRACTURE Left 03/05/2018   Procedure: OPEN REDUCTION INTERNAL FIXATION (ORIF) LEFT SCAPULA;  Surgeon: Beverely Low, MD;  Location: [facility] OR;  Service: Orthopedics;  Laterality: Left;   POSTERIOR CERVICAL FUSION/FORAMINOTOMY N/A 12/06/2019   Procedure: Cervical one-two Posterior instrumented fusion;  Surgeon: Jadene Pierini, MD;  Location: [facility] OR;  Service: Neurosurgery;  Laterality: N/A;   POSTERIOR CERVICAL FUSION/FORAMINOTOMY N/A 02/13/2020   Procedure: Revision of cervical instrumented fusion with Occiput to Cervical Four posterior instrumented fusion;  Surgeon: Jadene Pierini, MD;  Location: [facility] OR;  Service: Neurosurgery;  Laterality: N/A;  posterior   POSTERIOR CERVICAL FUSION/FORAMINOTOMY N/A 02/16/2020   Procedure: Occiput to Thoracic Two Posterior Cervical Fusion with AIRO and Application of Halo;  Surgeon: Jadene Pierini, MD;  Location: [facility] OR;  Service: Neurosurgery;  Laterality: N/A;   REVERSE SHOULDER ARTHROPLASTY Left 05/15/2017   Procedure: LEFT REVERSE SHOULDER ARTHROPLASTY;  Surgeon: Beverely Low, MD;   Location: [facility] OR;  Service: Orthopedics;  Laterality: Left;   REVERSE SHOULDER ARTHROPLASTY Left 10/11/2018   Procedure: left shoulder irrigation and debridement, open poly exchange and removal of painful hardware;  Surgeon: Beverely Low, MD;  Location: [facility] ORS;  Service: Orthopedics;  Laterality: Left;   THYROID LOBECTOMY     TOTAL KNEE ARTHROPLASTY Right 06/15/2012   Procedure: RIGHT TOTAL KNEE ARTHROPLASTY;  Surgeon: [name], MD;  Location: [facility] ORS;  Service: Orthopedics;  Laterality: Right;   WRIST SURGERY Left    Social History   Social History Narrative   Patient lives at home with her husband [name]).   Retired.   Education- college.   Caffeine- Two cups of decaf tea daily.   Right handed.         Immunization History  Administered Date(s) Administered   Influenza, High Dose Seasonal PF 12/03/2013   Influenza,inj,quad, With Preservative 11/23/2014   Influenza-Unspecified 12/19/2017   PFIZER(Purple Top)SARS-COV-2 Vaccination 04/16/2019, 05/29/2019, 01/09/2020   Td 12/15/2017   Zoster Recombinat (Shingrix) 08/10/2017, 01/05/2018     Objective: Vital Signs: BP 134/77 (  BP Location: Right Arm, Patient Position: Sitting, Cuff Size: Normal)   Pulse 76   Resp 14   Ht 5\' 3"  (1.6 m)   Wt 109 lb 6.4 oz (49.6 kg)   BMI 19.38 kg/m    Physical Exam Vitals and nursing note reviewed.  Constitutional:      Appearance: She is well-developed.  HENT:     Head: Normocephalic and atraumatic.  Eyes:     Conjunctiva/sclera: Conjunctivae normal.  Cardiovascular:     Rate and Rhythm: Normal rate and regular rhythm.     Heart sounds: Normal heart sounds.  Pulmonary:     Effort: Pulmonary effort is normal.     Breath sounds: Normal breath sounds.  Abdominal:     General: Bowel sounds are normal.     Palpations: Abdomen is soft.  Musculoskeletal:     Cervical back: Normal range of motion.  Lymphadenopathy:     Cervical: No cervical adenopathy.  Skin:    General: Skin is warm  and dry.     Capillary Refill: Capillary refill takes less than 2 seconds.  Neurological:     Mental Status: She is alert and oriented to person, place, and time.  Psychiatric:        Behavior: Behavior normal.     Musculoskeletal Exam: She had limited range of motion of her cervical spine.  Right shoulder joint was in full range of motion.  Left shoulder joint abduction was limited to 30 degrees.  Elbow joints and wrist joints with good range of motion.  She has bilateral CMC PIP and DIP thickening.  She has synovitis in multiple MCPs and PIPs as described below.  Hip joints were difficult to assess.  Knee joints with good range of motion.  She has swelling of her bilateral ankles and some of her MTPs as described below.  CDAI Exam: CDAI Score: 22.6  Patient Global: 8 mm; Provider Global: 8 mm Swollen: 19 ; Tender: 21  Joint Exam 09/05/2020      Right  Left  Glenohumeral      Tender  MCP 1  Swollen Tender     MCP 2  Swollen Tender     PIP 2  Swollen Tender  Swollen Tender  PIP 3  Swollen Tender  Swollen Tender  PIP 4  Swollen Tender  Swollen Tender  PIP 5  Swollen Tender  Swollen Tender  Cervical Spine   Tender     Ankle  Swollen Tender  Swollen Tender  MTP 1     Swollen Tender  MTP 2  Swollen Tender  Swollen Tender  MTP 3  Swollen Tender  Swollen Tender  MTP 4  Swollen Tender  Swollen Tender     Investigation: No additional findings.  Imaging: ECHOCARDIOGRAM COMPLETE  Result Date: 08/17/2020    ECHOCARDIOGRAM REPORT   Patient Name:   LISAMARIE COKE Date of Exam: 08/17/2020 Medical Rec #:  [id number]      Height:       62.0 in Accession #:    [id number]     Weight:       107.0 lb Date of Birth:  11/03/1940      BSA:          1.465 m Patient Age:    79 years       BP:           144/84 mmHg Patient Gender: F  HR:           87 bpm. Exam Location:  Outpatient Procedure: 2D Echo, Cardiac Doppler and Color Doppler Indications:    Dyspnea  History:        Patient has no  prior history of Echocardiogram examinations.                 Risk Factors:Hypertension.  Sonographer:    Ross Ludwig RDCS (AE) Referring Phys: [id number] DANIEL R BENSIMHON IMPRESSIONS  1. Left ventricular ejection fraction, by estimation, is 60 to 65%. The left ventricle has normal function. The left ventricle has no regional wall motion abnormalities. Left ventricular diastolic parameters are consistent with Grade II diastolic dysfunction (pseudonormalization).  2. Right ventricular systolic function is normal. The right ventricular size is normal.  3. Left atrial size was mild to moderately dilated.  4. Right atrial size was mild to moderately dilated.  5. The mitral valve is normal in structure. Mild mitral valve regurgitation. No evidence of mitral stenosis.  6. The aortic valve is normal in structure. There is moderate calcification of the aortic valve. Aortic valve regurgitation is not visualized. Mild to moderate aortic valve sclerosis/calcification is present, without any evidence of aortic stenosis.  7. The inferior vena cava is normal in size with greater than 50% respiratory variability, suggesting right atrial pressure of 3 mmHg. FINDINGS  Left Ventricle: Left ventricular ejection fraction, by estimation, is 60 to 65%. The left ventricle has normal function. The left ventricle has no regional wall motion abnormalities. The left ventricular internal cavity size was normal in size. There is  no left ventricular hypertrophy. Left ventricular diastolic parameters are consistent with Grade II diastolic dysfunction (pseudonormalization). Right Ventricle: The right ventricular size is normal. No increase in right ventricular wall thickness. Right ventricular systolic function is normal. Left Atrium: Left atrial size was mild to moderately dilated. Right Atrium: Right atrial size was mild to moderately dilated. Pericardium: There is no evidence of pericardial effusion. Mitral Valve: The mitral valve is normal in  structure. Mild mitral valve regurgitation. No evidence of mitral valve stenosis. MV peak gradient, 3.2 mmHg. The mean mitral valve gradient is 1.0 mmHg. Tricuspid Valve: The tricuspid valve is normal in structure. Tricuspid valve regurgitation is trivial. No evidence of tricuspid stenosis. Aortic Valve: The aortic valve is normal in structure. There is moderate calcification of the aortic valve. Aortic valve regurgitation is not visualized. Mild to moderate aortic valve sclerosis/calcification is present, without any evidence of aortic stenosis. Aortic valve mean gradient measures 3.0 mmHg. Aortic valve peak gradient measures 4.6 mmHg. Aortic valve area, by VTI measures 2.09 cm. Pulmonic Valve: The pulmonic valve was not well visualized. Pulmonic valve regurgitation is not visualized. No evidence of pulmonic stenosis. Aorta: The aortic root is normal in size and structure. Venous: The inferior vena cava is normal in size with greater than 50% respiratory variability, suggesting right atrial pressure of 3 mmHg. IAS/Shunts: No atrial level shunt detected by color flow Doppler.  LEFT VENTRICLE PLAX 2D LVIDd:         4.00 cm  Diastology LVIDs:         2.60 cm  LV e' medial:    8.81 cm/s LV PW:         1.00 cm  LV E/e' medial:  9.6 LV IVS:        1.00 cm  LV e' lateral:   10.40 cm/s LVOT diam:     2.00 cm  LV E/e' lateral:  8.1 LV SV:         45 LV SV Index:   31 LVOT Area:     3.14 cm  RIGHT VENTRICLE             IVC RV Basal diam:  3.00 cm     IVC diam: 2.20 cm RV S prime:     10.90 cm/s TAPSE (M-mode): 2.0 cm LEFT ATRIUM             Index       RIGHT ATRIUM           Index LA diam:        3.50 cm 2.39 cm/m  RA Area:     13.20 cm LA Vol (A2C):   40.2 ml 27.43 ml/m RA Volume:   30.10 ml  20.54 ml/m LA Vol (A4C):   41.3 ml 28.18 ml/m LA Biplane Vol: 41.8 ml 28.52 ml/m  AORTIC VALVE AV Area (Vmax):    2.47 cm AV Area (Vmean):   2.09 cm AV Area (VTI):     2.09 cm AV Vmax:           107.00 cm/s AV Vmean:           77.500 cm/s AV VTI:            0.215 m AV Peak Grad:      4.6 mmHg AV Mean Grad:      3.0 mmHg LVOT Vmax:         84.20 cm/s LVOT Vmean:        51.600 cm/s LVOT VTI:          0.143 m LVOT/AV VTI ratio: 0.67  AORTA Ao Root diam: 3.30 cm MITRAL VALVE MV Area (PHT): 4.60 cm    SHUNTS MV Area VTI:   1.97 cm    Systemic VTI:  0.14 m MV Peak grad:  3.2 mmHg    Systemic Diam: 2.00 cm MV Mean grad:  1.0 mmHg MV Vmax:       0.89 m/s MV Vmean:      48.0 cm/s MV Decel Time: 165 msec MV E velocity: 84.40 cm/s MV A velocity: 46.80 cm/s MV E/A ratio:  1.80 Arvilla Meres MD Electronically signed by Arvilla Meres MD Signature Date/Time: 08/17/2020/12:34:22 PM    Final    VAS [place] LOWER EXTREMITY VENOUS (DVT)  Result Date: 08/30/2020  Lower Venous DVT Study Patient Name:  OLEAN SANGSTER  Date of Exam:   08/29/2020 Medical Rec #: [id number]       Accession #:    [id number] Date of Birth: 1940/09/08       Patient Gender: F Patient Age:   79Y Exam Location:  [facility] Procedure:      VAS [place] LOWER EXTREMITY VENOUS (DVT) Referring Phys: [id number] DANIEL R BENSIMHON --------------------------------------------------------------------------------  Indications: Swelling.  Comparison Study: None Performing Technologist: Ethelle Lyon  Examination Guidelines: A complete evaluation includes B-mode imaging, spectral Doppler, color Doppler, and power Doppler as needed of all accessible portions of each vessel. Bilateral testing is considered an integral part of a complete examination. Limited examinations for reoccurring indications may be performed as noted. The reflux portion of the exam is performed with the patient in reverse Trendelenburg.  +---------+---------------+---------+-----------+----------+--------------+ RIGHT    CompressibilityPhasicitySpontaneityPropertiesThrombus Aging +---------+---------------+---------+-----------+----------+--------------+ CFV      Full           Yes      Yes                                  +---------+---------------+---------+-----------+----------+--------------+  SFJ      Full                                                        +---------+---------------+---------+-----------+----------+--------------+ FV Prox  Full                                                        +---------+---------------+---------+-----------+----------+--------------+ FV Mid   Full           Yes      Yes                                 +---------+---------------+---------+-----------+----------+--------------+ FV DistalFull                                                        +---------+---------------+---------+-----------+----------+--------------+ PFV      Full                                                        +---------+---------------+---------+-----------+----------+--------------+ POP      Full           Yes      Yes                                 +---------+---------------+---------+-----------+----------+--------------+ PTV      Full                                                        +---------+---------------+---------+-----------+----------+--------------+ PERO     Full                                                        +---------+---------------+---------+-----------+----------+--------------+ Gastroc  Full                                                        +---------+---------------+---------+-----------+----------+--------------+ GSV      Full                                                        +---------+---------------+---------+-----------+----------+--------------+  SSV      Partial                                      Chronic        +---------+---------------+---------+-----------+----------+--------------+   +---------+---------------+---------+-----------+----------+--------------+ LEFT     CompressibilityPhasicitySpontaneityPropertiesThrombus Aging  +---------+---------------+---------+-----------+----------+--------------+ CFV      Full           Yes      Yes                                 +---------+---------------+---------+-----------+----------+--------------+ SFJ      Full                                                        +---------+---------------+---------+-----------+----------+--------------+ FV Prox  Full                                                        +---------+---------------+---------+-----------+----------+--------------+ FV Mid   Full           Yes      Yes                                 +---------+---------------+---------+-----------+----------+--------------+ FV DistalFull                                                        +---------+---------------+---------+-----------+----------+--------------+ PFV      Full                                                        +---------+---------------+---------+-----------+----------+--------------+ POP      Full           Yes      Yes                                 +---------+---------------+---------+-----------+----------+--------------+ PTV      Full                                                        +---------+---------------+---------+-----------+----------+--------------+ PERO     Full                                                        +---------+---------------+---------+-----------+----------+--------------+  Gastroc  Full                                                        +---------+---------------+---------+-----------+----------+--------------+ GSV      Full                                                        +---------+---------------+---------+-----------+----------+--------------+ SSV      Partial                                      Chronic        +---------+---------------+---------+-----------+----------+--------------+     Summary: BILATERAL: - No evidence of deep  vein thrombosis seen in the lower extremities, bilaterally. 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  LEFT: - Findings consistent with chronic superficial vein thrombosis involving the left small saphenous vein. - A cystic structure is found in the popliteal fossa (1.74 cm).  *See table(s) above for measurements and observations. Electronically signed by Waverly Ferrari MD on 08/30/2020 at 7:57:07 AM.    Final     Recent Labs: Lab Results  Component Value Date   WBC 4.5 08/17/2020   HGB 10.3 (L) 08/17/2020   PLT 220 08/17/2020   NA 138 08/17/2020   K 4.4 08/17/2020   CL 104 08/17/2020   CO2 27 08/17/2020   GLUCOSE 84 08/17/2020   BUN 22 08/17/2020   CREATININE 0.91 08/17/2020   BILITOT 0.8 08/17/2020   ALKPHOS 124 08/17/2020   AST 16 08/17/2020   ALT 12 08/17/2020   PROT 6.1 (L) 08/17/2020   ALBUMIN 3.7 08/17/2020   CALCIUM 10.7 (H) 08/17/2020   GFRAA 98 04/03/2020   QFTBGOLDPLUS NEGATIVE 01/03/2020    Speciality Comments: PLQ Eye Exam: 07/30/17 WNL @ [facility] Osteoporosis managed by PCP-[facility] 10/12/2018  Procedures:  No procedures performed Allergies: Meperidine hcl, Codeine, and Demerol [meperidine]   Assessment / Plan:     Visit Diagnoses: 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  -patient is having a severe flare of rheumatoid arthritis involving multiple joints.  She is having difficulty with mobility..  Detailed discussion regarding different treatment options with patient and her husband.  With her history of multiple sclerosis the choices are limited.  She did not take any anti-TNF's.  We discussed the option of Rinvoq.  Indications side effects contraindications including the black box warning for MACE was discussed.  Patient wants to see her gastroenterologist prior to making a decision as she has history of diverticulosis and abdominal  discomfort.  A handout on Rinvoq was given and consent was taken.  Once she gets GI clearance we can switch her from Orencia to Rinvoq.  She also wanted to start on hydroxychloroquine.  She believes she did much better while she was on hydroxychloroquine.  She had an adequate response to hydroxychloroquine in the past.  Per her request after informed consent was obtained she was placed on hydroxychloroquine 200 mg p.o. daily.  I also gave her a prednisone taper  as a bridging therapy as she has severe arthritis today.  Plan: predniSONE (DELTASONE) 5 MG tablet, hydroxychloroquine (PLAQUENIL) 200 MG tablet  Counseled patient that Rinvoq is a JAK inhibitor indicated for Rheumatoid Arthritis.  Counseled patient on purpose, proper use, and adverse effects of Rinvoq.    Reviewed the most common adverse effects including infection, diarrhea, headaches.  Also reviewed rare adverse effects such as bowel injury and the need to contact us if they develop stomach pain during treatment. Counseled on the increase risk of venous thrombosis. Counseled about FDA black box warning of MACE (major adverse CV events including cardiovascular death, myocardial infarction, and stroke).  Reviewed with patient that there is the possibility of an increased risk of malignancy specifically lung cancer and lymphomas but it is not well understood if this increased risk is due to the medication or the disease state. Instructed patient that medication should be held for infection and prior to surgery.  Advised patient to avoid live vaccines. Recommend annual influenza, PCV 15 or PCV20 or Pneumovax 23, and Shingrix as indicated.    Reviewed importance of routine lab monitoring including lipid panel.  Will recheck lipid panel 3 months after starting and annually thereafter. CBC and CMP will be monitored routinely every 3 months. Standing orders placed. Provided patient with medication education material and answered all questions.  Patient  consented to Rinvoq.  Will upload into patient's chart.  Will apply through patient's insurance and update when we receive a response.    Patient dose will be 15 mg daily.  Prescription will be sent to pharmacy pending lab results and insurance approval.   Patient was counseled on the purpose, proper use, and adverse effects of hydroxychloroquine including nausea/diarrhea, skin rash, headaches, and sun sensitivity.  Discussed importance of annual eye exams while on hydroxychloroquine to monitor to ocular toxicity and discussed importance of frequent laboratory monitoring.  Provided patient with eye exam form for baseline ophthalmologic exam.  Provided patient with educational materials on hydroxychloroquine and answered all questions.  Patient consented to hydroxychloroquine.  Will upload consent in the media tab.     High risk medication use - Orencia 125 mg subq injection once weekly started on 12/08/17.  (Plaquenil was discontinued due to inadequate response in the past.).  She discontinued Methotrexate due to experiencing increased SOB.  August 17, 2020 CBC was normal except hemoglobin of 10.7.  Calcium was mildly elevated due to Forteo use.  She was advised to stop Orencia in case she develops an infection and resume medications once the infection resolves.Instructions regarding future vaccination were also placed in the AVS.  Primary osteoarthritis of both hands -she continues to have some discomfort due to underlying osteoarthritis.  H/O total shoulder replacement, left - She had surgery by Dr. Devonne Doughty in the past.  She has limited range of motion of her left shoulder joint.  Status post right knee replacement-she had fairly good range of motion of her knee joints.  Primary osteoarthritis of left knee-she had good range of motion of the left knee joint.  Primary osteoarthritis of both feet-she had swelling of bilateral ankle joints due to flare of rheumatoid arthritis.  Closed fracture of  cervical vertebra, unspecified cervical vertebral level, sequela - Patient had a fall in January 2020 at which time she fractured C1 and C2 with displacement requiring fusion on 12/06/2019.  Osteopenia of multiple sites - on forteo. DEXA on 02/22/18: The BMD measured at [facility] Right is 0.742 g/cm2 with a T-score of -  2.1.  Forteo was a started in February 2022.  History of vitamin D deficiency-she is on vitamin D 5000 units daily.  Calcium was elevated.  She was advised to stop vitamin D supplement.  Postural kyphosis of thoracic region  History of diverticulosis-patient will see her gastroenterologist for evaluation.  Increased risk of diverticulitis with" discussed.  History of GI bleed  History of multiple sclerosis (HCC)  History of cataract  Dyslipidemia  History of peripheral neuropathy  Orders: No orders of the defined types were placed in this encounter.  Meds ordered this encounter  Medications   predniSONE (DELTASONE) 5 MG tablet    Sig: Take 2 tablets (10 mg total) by mouth daily with breakfast for 7 days, THEN 1.5 tablets (7.5 mg total) daily with breakfast for 7 days, THEN 1 tablet (5 mg total) daily with breakfast for 7 days, THEN 0.5 tablets (2.5 mg total) daily with breakfast for 7 days.    Dispense:  35 tablet    Refill:  0   hydroxychloroquine (PLAQUENIL) 200 MG tablet    Sig: Take 1 tablet (200 mg total) by mouth daily.    Dispense:  90 tablet    Refill:  0      Follow-Up Instructions: Return in about 3 weeks (around 09/26/2020) for Rheumatoid arthritis.   Pollyann Savoy, MD  Note - This record has been created using Animal nutritionist.  Chart creation errors have been sought, but may not always  have been located. Such creation errors do not reflect on  the standard of medical care.

## 2020-08-28 ENCOUNTER — Other Ambulatory Visit: Payer: Self-pay | Admitting: Physician Assistant

## 2020-08-28 ENCOUNTER — Other Ambulatory Visit (HOSPITAL_COMMUNITY): Payer: Self-pay

## 2020-08-28 DIAGNOSIS — M8589 Other specified disorders of bone density and structure, multiple sites: Secondary | ICD-10-CM

## 2020-08-28 DIAGNOSIS — S129XXS Fracture of neck, unspecified, sequela: Secondary | ICD-10-CM

## 2020-08-28 MED ORDER — FORTEO 600 MCG/2.4ML ~~LOC~~ SOPN
PEN_INJECTOR | SUBCUTANEOUS | 5 refills | Status: DC
  Filled 2020-08-28: qty 2.4, 28d supply, fill #0
  Filled 2020-09-27 – 2020-10-18 (×2): qty 2.4, 28d supply, fill #1
  Filled 2020-11-13: qty 2.4, 28d supply, fill #2
  Filled 2020-12-31: qty 2.4, 28d supply, fill #3
  Filled 2021-01-25 – 2021-02-12 (×2): qty 2.4, 28d supply, fill #4
  Filled 2021-03-15 – 2021-03-26 (×2): qty 2.4, 28d supply, fill #5

## 2020-08-28 MED ORDER — ORENCIA 125 MG/ML ~~LOC~~ SOSY
125.0000 mg | PREFILLED_SYRINGE | SUBCUTANEOUS | 2 refills | Status: DC
  Filled 2020-08-28: qty 4, 28d supply, fill #0
  Filled 2020-09-24: qty 4, 28d supply, fill #1

## 2020-08-28 NOTE — Telephone Encounter (Signed)
Next Visit: 09/05/2020  Last Visit: 04/03/2020  Last Fill: 05/21/2020  DX: Rheumatoid arthritis involving multiple sites with positive rheumatoid factor  Current Dose per office note on 04/03/2020: Orencia 125 mg subq injection once weekly  Labs: 08/17/2020 CBC: RBC 3.62, hemoglobin 10.3, HCT 33.7 CMP: calcium 10.7, total protein 6.1  TB Gold: 01/03/2020 negative    Okay to refill orencia?

## 2020-08-28 NOTE — Telephone Encounter (Signed)
Next Visit: 09/05/2020  Last Visit: 04/03/2020  Last Fill: 04/04/2020  DX: Osteopenia of multiple sites  Started forteo on 04/11/2020  Labs: 04/03/2020 Hgb and hct remain slightly low but are improving. Rest of CBC WNL.  CMP WNL.  Okay to refill forteo?

## 2020-08-29 ENCOUNTER — Other Ambulatory Visit: Payer: Self-pay

## 2020-08-29 ENCOUNTER — Ambulatory Visit (HOSPITAL_COMMUNITY)
Admission: RE | Admit: 2020-08-29 | Discharge: 2020-08-29 | Disposition: A | Discharge status: Home / Self Care | Payer: Medicare PPO | Source: Ambulatory Visit | Attending: Internal Medicine | Admitting: Internal Medicine

## 2020-08-29 DIAGNOSIS — R609 Edema, unspecified: Secondary | ICD-10-CM | POA: Diagnosis not present

## 2020-08-30 ENCOUNTER — Other Ambulatory Visit (HOSPITAL_COMMUNITY): Payer: Self-pay

## 2020-08-31 ENCOUNTER — Other Ambulatory Visit (HOSPITAL_COMMUNITY): Payer: Self-pay

## 2020-09-05 ENCOUNTER — Ambulatory Visit: Payer: Medicare PPO | Admitting: Rheumatology

## 2020-09-05 ENCOUNTER — Telehealth: Payer: Self-pay

## 2020-09-05 ENCOUNTER — Encounter: Payer: Self-pay | Admitting: Rheumatology

## 2020-09-05 ENCOUNTER — Telehealth: Payer: Self-pay | Admitting: Pharmacist

## 2020-09-05 ENCOUNTER — Other Ambulatory Visit (HOSPITAL_COMMUNITY): Payer: Self-pay

## 2020-09-05 ENCOUNTER — Other Ambulatory Visit: Payer: Self-pay

## 2020-09-05 VITALS — BP 134/77 | HR 76 | Resp 14 | Ht 63.0 in | Wt 109.4 lb

## 2020-09-05 DIAGNOSIS — M1712 Unilateral primary osteoarthritis, left knee: Secondary | ICD-10-CM

## 2020-09-05 DIAGNOSIS — Z8639 Personal history of other endocrine, nutritional and metabolic disease: Secondary | ICD-10-CM

## 2020-09-05 DIAGNOSIS — Z96651 Presence of right artificial knee joint: Secondary | ICD-10-CM | POA: Diagnosis not present

## 2020-09-05 DIAGNOSIS — E785 Hyperlipidemia, unspecified: Secondary | ICD-10-CM

## 2020-09-05 DIAGNOSIS — M8589 Other specified disorders of bone density and structure, multiple sites: Secondary | ICD-10-CM

## 2020-09-05 DIAGNOSIS — M0579 Rheumatoid arthritis with rheumatoid factor of multiple sites without organ or systems involvement: Secondary | ICD-10-CM

## 2020-09-05 DIAGNOSIS — Z96612 Presence of left artificial shoulder joint: Secondary | ICD-10-CM | POA: Diagnosis not present

## 2020-09-05 DIAGNOSIS — M19041 Primary osteoarthritis, right hand: Secondary | ICD-10-CM

## 2020-09-05 DIAGNOSIS — M19042 Primary osteoarthritis, left hand: Secondary | ICD-10-CM

## 2020-09-05 DIAGNOSIS — G35 Multiple sclerosis: Secondary | ICD-10-CM

## 2020-09-05 DIAGNOSIS — M19071 Primary osteoarthritis, right ankle and foot: Secondary | ICD-10-CM

## 2020-09-05 DIAGNOSIS — Z79899 Other long term (current) drug therapy: Secondary | ICD-10-CM

## 2020-09-05 DIAGNOSIS — S129XXS Fracture of neck, unspecified, sequela: Secondary | ICD-10-CM

## 2020-09-05 DIAGNOSIS — M19072 Primary osteoarthritis, left ankle and foot: Secondary | ICD-10-CM

## 2020-09-05 DIAGNOSIS — Z8719 Personal history of other diseases of the digestive system: Secondary | ICD-10-CM

## 2020-09-05 DIAGNOSIS — Z8669 Personal history of other diseases of the nervous system and sense organs: Secondary | ICD-10-CM

## 2020-09-05 DIAGNOSIS — M4004 Postural kyphosis, thoracic region: Secondary | ICD-10-CM

## 2020-09-05 MED ORDER — HYDROXYCHLOROQUINE SULFATE 200 MG PO TABS: 200.0000 mg | ORAL_TABLET | Freq: Every day | ORAL | 0 refills | Status: DC

## 2020-09-05 MED ORDER — PREDNISONE 5 MG PO TABS: ORAL_TABLET | ORAL | 0 refills | Status: DC

## 2020-09-05 NOTE — Telephone Encounter (Signed)
Received notification from [facility] regarding a prior authorization for [facility]. Authorization has been APPROVED from 09/05/2020 to 03/02/2021.   Patient can continue to fill through [facility]: [phone]   Authorization # [id number] Phone # [phone]   Test claim revealed that insurance covers [phone], leaving patient with a copay of $100.00.

## 2020-09-05 NOTE — Telephone Encounter (Signed)
Please start Rinvoq BIV.  Dose: 15mg  daily  Dx: M05.79 (RA)  Previously tried therapies: Orencia - started September 2019 and has had waning response now Hydroxychloroquine - inadequate response  Therapies patient unable to try: TNF inhibitor- personal history of MS Actemra/Kevzara - personal history of MS  October 2019, PharmD, MPH Clinical Pharmacist (Rheumatology and Pulmonology)

## 2020-09-05 NOTE — Patient Instructions (Signed)
Upadacitinib Extended-release Tablets What is this medication? UPADACITINIB (ue PAD a SYE ti nib) treats rheumatoid arthritis, psoriatic arthritis, and atopic dermatitis. It works on the immune system. It belongs toa group of medicines called JAK inhibitors. This medicine may be used for other purposes; ask your health care provider orpharmacist if you have questions. COMMON BRAND NAME(S): RINVOQ What should I tell my care team before I take this medication? They need to know if you have any of these conditions: blood clots cancer diabetes (high blood sugar) heart disease high blood pressure high cholesterol immune system problems infection especially a viral infection such as chickenpox, cold sores, or herpes infection such as tuberculosis (TB) or other bacterial, fungal or viral infection liver disease low blood counts (white cells, platelets, or red blood cells) lung or breathing disease (asthma, COPD) organ transplant recent or upcoming vaccine skin cancer/melanoma smoke tobacco cigarettes stomach or intestine problems stroke an unusual or allergic reaction to upadacitinib, other medicines, foods, dyes or preservatives pregnant or trying to get pregnant breast-feeding How should I use this medication? Take this medication by mouth with water. Take it as directed on the prescription label at the same time every day. Do not cut, crush, or chew this medicine. Swallow the tablets whole. You can take it with or without food. If it upsets your stomach, take it with food. Keep taking it unless your care teamtells you to stop. A special MedGuide will be given to you by the pharmacist with eachprescription and refill. Be sure to read this information carefully each time. Talk to your care team about the use of this medication in children. While it may be prescribed for children as young as 12 years for select conditions,precautions do apply. Overdosage: If you think you have taken too much  of this medicine contact apoison control center or emergency room at once. NOTE: This medicine is only for you. Do not share this medicine with others. What if I miss a dose? If you miss a dose, take it as soon as you can. If it is almost time for yournext dose, take only that dose. Do not take double or extra doses. What may interact with this medication? Do not take this medicine with any of the following medications: baricitinib tofacitinib This medicine may also interact with the following medications: azathioprine biologic medicines such as abatacept, adalimumab, anakinra, certolizumab, etanercept, golimumab, infliximab, rituximab, secukinumab, tocilizumab, ustekinumab certain medicines for fungal infections like ketoconazole, itraconazole, or posaconazole certain medicines for seizures like carbamazepine, phenobarbital, phenytoin clarithromycin cyclosporine live vaccines medicines that lower your chance of fighting infection rifampin supplements, such as St. John's wort This list may not describe all possible interactions. Give your health care provider a list of all the medicines, herbs, non-prescription drugs, or dietary supplements you use. Also tell them if you smoke, drink alcohol, or use illegaldrugs. Some items may interact with your medicine. What should I watch for while using this medication? Visit your care team for regular checks on your progress. Tell your care teamif your symptoms do not start to get better or if they get worse. You may need blood work done while you are taking this medication. Avoid taking medications that contain aspirin, acetaminophen, ibuprofen, naproxen, or ketoprofen unless instructed by your care team. These medicationsmay hide a fever. This medication may increase your risk of getting an infection. Call your care team for advice if you get a fever, chills, sore throat, or other symptoms of a cold or flu. Do not  treat yourself. Try to avoid being  around people who aresick. Do not become pregnant while taking this medication. Women should inform their care team if they wish to become pregnant or think they might be pregnant. Women should use a form of birth control while taking this medication. Women will also need to take it for 4 weeks after stopping this medication. There is potential for serious harm to an unborn child. Talk to your health careprovider for more information. Do not breast-feed an infant while taking this medication or for 6 days afterstopping it. Talk to your care team about your risk of cancer. You may be more at risk forcertain types of cancer if you take this medication. Talk to your care team about your risk of skin cancer. You may be more at riskfor skin cancer if you take this medication. This medication can make you more sensitive to the sun. Keep out of the sun. If you cannot avoid being in the sun, wear protective clothing and sunscreen. Donot use sun lamps or tanning beds/booths. Tell your care team right away if you have any change in your eyesight. What side effects may I notice from receiving this medication? Side effects that you should report to your doctor or health care professionalas soon as possible: Allergic reactions-skin rash, itching, hives, swelling of the face, lips, tongue, or throat Blood clot-pain, swelling, or warmth in the leg, shortness of breath, chest pain Change in vision Heart attack-pain or tightness in the chest, shoulders, arms, or jaw, nausea, shortness of breath, cold or clammy skin, feeling faint or lightheaded Infection-fever, chills, cough, sore throat, wounds that don't heal, pain or trouble when passing urine, general feeling of discomfort or being unwell Liver injury-right upper belly pain, loss of appetite, nausea, light-colored stool, dark yellow or brown urine, yellowing skin or eyes, unusual weakness or fatigue Low red blood cell count-unusually weakness or fatigue,  dizziness, headache, trouble breathing Stomach pain Stroke-sudden numbness or weakness of the face, arm, or leg, trouble speaking, confusion, trouble walking, loss of balance or coordination, dizziness, severe headache, change in vision Side effects that usually do not require medical attention (report these toyour doctor or health care professional if they continue or are bothersome): Runny or stuffy nose Nausea This list may not describe all possible side effects. Call your doctor for medical advice about side effects. You may report side effects to FDA at1-800-FDA-1088. Where should I keep my medication? Keep out of the reach of children and pets. Store at room temperature between 20 and 25 degrees C (68 and 77 degrees F).Get rid of any unused medicine after the expiration date. To get rid of medicines that are no longer needed or have expired: Take the medicine to a medicine take-back program. Check with your pharmacy or law enforcement to find a location. If you cannot return the medicine, check the label or package insert to see if the medicine should be thrown out in the garbage or flushed down the toilet. If you are not sure, ask your health care provider. If it is safe to put it in the trash, empty the medicine out of the container. Mix the medicine with cat litter, dirt, coffee grounds, or other unwanted substance. Seal the mixture in a bag or container. Put it in the trash. NOTE: This sheet is a summary. It may not cover all possible information. If you have questions about this medicine, talk to your doctor, pharmacist, orhealth care provider.  2022 Elsevier/Gold Standard (2020-05-01 13:47:36)  Hydroxychloroquine tablets What is this medication? HYDROXYCHLOROQUINE (hye drox ee KLOR oh kwin) is used to treat rheumatoidarthritis and systemic lupus erythematosus. It is also used to treat malaria. This medicine may be used for other purposes; ask your health care provider orpharmacist if  you have questions. COMMON BRAND NAME(S): Plaquenil, Quineprox What should I tell my care team before I take this medication? They need to know if you have any of these conditions: diabetes eye disease, vision problems G6PD deficiency heart disease history of irregular heartbeat if you often drink alcohol kidney disease liver disease porphyria psoriasis an unusual or allergic reaction to chloroquine, hydroxychloroquine, other medicines, foods, dyes, or preservatives pregnant or trying to get pregnant breast-feeding How should I use this medication? Take this medicine by mouth with a glass of water. Take it as directed on the prescription label. Do not cut, crush or chew this medicine. Swallow the tablets whole. Take it with food. Do not take it more than directed. Take all of this medicine unless your health care provider tells you to stop it early.Keep taking it even if you think you are better. Take products with antacids in them at a different time of day than this medicine. Take this medicine 4 hours before or 4 hours after antacids. Talk toyour health care provider if you have questions. Talk to your pediatrician regarding the use of this medicine in children. Whilethis drug may be prescribed for selected conditions, precautions do apply. Overdosage: If you think you have taken too much of this medicine contact apoison control center or emergency room at once. NOTE: This medicine is only for you. Do not share this medicine with others. What if I miss a dose? If you miss a dose, take it as soon as you can. If it is almost time for yournext dose, take only that dose. Do not take double or extra doses. What may interact with this medication? Do not take this medicine with any of the following medications: cisapride dronedarone pimozide thioridazine This medicine may also interact with the following  medications: ampicillin antacids cimetidine cyclosporine digoxin kaolin medicines for diabetes, like insulin, glipizide, glyburide medicines for seizures like carbamazepine, phenobarbital, phenytoin mefloquine methotrexate other medicines that prolong the QT interval (cause an abnormal heart rhythm) praziquantel This list may not describe all possible interactions. Give your health care provider a list of all the medicines, herbs, non-prescription drugs, or dietary supplements you use. Also tell them if you smoke, drink alcohol, or use illegaldrugs. Some items may interact with your medicine. What should I watch for while using this medication? Visit your health care provider for regular checks on your progress. Tell your health care provider if your symptoms do not start to get better or if they getworse. You may need blood work done while you are taking this medicine. If you take other medicines that can affect heart rhythm, you may need more testing. Talkto your health care provider if you have questions. Your vision may be tested before and during use of this medicine. Tell yourhealth care provider right away if you have any change in your eyesight. This medicine may cause serious skin reactions. They can happen weeks to months after starting the medicine. Contact your health care provider right away if you notice fevers or flu-like symptoms with a rash. The rash may be red or purple and then turn into blisters or peeling of the skin. Or, you might notice a red rash with swelling of the face, lips or lymph  nodes in your neck or underyour arms. If you or your family notice any changes in your behavior, such as new or worsening depression, thoughts of harming yourself, anxiety, or other unusual or disturbing thoughts, or memory loss, call your health care provider rightaway. What side effects may I notice from receiving this medication? Side effects that you should report to your doctor or  health care professionalas soon as possible: allergic reactions (skin rash, itching or hives; swelling of the face, lips, or tongue) changes in vision decreased hearing, ringing in the ears heartbeat rhythm changes (trouble breathing; chest pain; dizziness; fast, irregular heartbeat; feeling faint or lightheaded, falls) liver injury (dark yellow or brown urine; general ill feeling or flu-like symptoms; loss of appetite, right upper belly pain; unusually weak or tired, yellowing of the eyes or skin) low blood sugar (feeling anxious; confusion; dizziness; increased hunger; unusually weak or tired; increased sweating; shakiness; cold, clammy skin; irritable; headache; blurred vision; fast heartbeat; loss of consciousness) low red blood cell counts (trouble breathing; feeling faint; lightheaded, falls; unusually weak or tired) muscle weakness pain, tingling, numbness in the hands or feet rash, fever, and swollen lymph nodes redness, blistering, peeling or loosening of the skin, including inside the mouth suicidal thoughts, mood changes uncontrollable head, mouth, neck, arm, or leg movements unusual bruising or bleeding Side effects that usually do not require medical attention (report to yourdoctor or health care professional if they continue or are bothersome): diarrhea hair loss irritable This list may not describe all possible side effects. Call your doctor for medical advice about side effects. You may report side effects to FDA at1-800-FDA-1088. Where should I keep my medication? Keep out of the reach of children and pets. Store at room temperature up to 30 degrees C (86 degrees F). Protect fromlight. Get rid of any unused medicine after the expiration date. To get rid of medicines that are no longer needed or have expired: Take the medicine to a medicine take-back program. Check with your pharmacy or law enforcement to find a location. If you cannot return the medicine, check the label or  package insert to see if the medicine should be thrown out in the garbage or flushed down the toilet. If you are not sure, ask your health care provider. If it is safe to put it in the trash, empty the medicine out of the container. Mix the medicine with cat litter, dirt, coffee grounds, or other unwanted substance. Seal the mixture in a bag or container. Put it in the trash. NOTE: This sheet is a summary. It may not cover all possible information. If you have questions about this medicine, talk to your doctor, pharmacist, orhealth care provider.  2022 Elsevier/Gold Standard (2019-08-08 15:07:49)    Standing Labs We placed an order today for your standing lab work.   Please have your standing labs drawn in September and every 3 months  If possible, please have your labs drawn 2 weeks prior to your appointment so that the provider can discuss your results at your appointment.  Please note that you may see your imaging and lab results in MyChart before we have reviewed them. We may be awaiting multiple results to interpret others before contacting you. Please allow our office up to 72 hours to thoroughly review all of the results before contacting the office for clarification of your results.  We have open lab daily: Monday through Thursday from 1:30-4:30 PM and Friday from 1:30-4:00 PM at the office of Dr. Pollyann Savoy,  [facility].   Please be advised, all patients with office appointments requiring lab work will take precedent over walk-in lab work.  If possible, please come for your lab work on Monday and Friday afternoons, as you may experience shorter wait times. The office is located at [address], [address], [place], [place] [address] No appointment is necessary.   Labs are drawn by [facility]. Please bring your co-pay at the time of your lab draw.  You may receive a bill from [facility] for your lab work.  If you wish to have your labs drawn at another location, please  call the office 24 hours in advance to send orders.  If you have any questions regarding directions or hours of operation,  please call [phone].   As a reminder, please drink plenty of water prior to coming for your lab work. Thanks!  COVID-19 vaccine recommendations:   COVID-19 vaccine is recommended for everyone (unless you are allergic to a vaccine component), even if you are on a medication that suppresses your immune system.   If you are on Methotrexate, Cellcept (mycophenolate), Rinvoq, Harriette Ohara, and Olumiant- hold the medication for 1 week after each vaccine. Hold Methotrexate for 2 weeks after the single dose COVID-19 vaccine.   If you are on Orencia subcutaneous injection - hold medication one week prior to and one week after the first COVID-19 vaccine dose (only).   If you are on Orencia IV infusions- time vaccination administration so that the first COVID-19 vaccination will occur four weeks after the infusion and postpone the subsequent infusion by one week.   If you are on Cyclophosphamide or Rituxan infusions please contact your doctor prior to receiving the COVID-19 vaccine.   Do not take Tylenol or any anti-inflammatory medications (NSAIDs) 24 hours prior to the COVID-19 vaccination.   There is no direct evidence about the efficacy of the COVID-19 vaccine in individuals who are on medications that suppress the immune system.   Even if you are fully vaccinated, and you are on any medications that suppress your immune system, please continue to wear a mask, maintain at least six feet social distance and practice hand hygiene.   If you develop a COVID-19 infection, please contact your PCP or our office to determine if you need monoclonal antibody infusion.  The booster vaccine is now available for immunocompromised patients.   Please see the following web sites for updated information.    [email or web address]   If you test POSITIVE for COVID19 and have MILD to MODERATE symptoms: First, call your PCP if you would like to receive COVID19 treatment AND Hold your medications during the infection and for at least 1 week after your symptoms have resolved: Injectable medication (Benlysta, Cimzia, Cosentyx, Enbrel, Humira, Orencia, Remicade, Simponi, Stelara, Taltz, Tremfya) Methotrexate Leflunomide (Arava) Mycophenolate (Cellcept) Harriette Ohara, Olumiant, or Rinvoq If you take Actemra or Kevzara, you DO NOT need to hold these for COVID19 infection.  If you test POSITIVE for COVID19 and have NO symptoms: First, call your PCP if you would like to receive COVID19 treatment AND Hold your medications for at least 10 days after the day that you tested positive Injectable medication (Benlysta, Cimzia, Cosentyx, Enbrel, Humira, Orencia, Remicade, Simponi, Stelara, Taltz, Tremfya) Methotrexate Leflunomide (Arava) Mycophenolate (Cellcept) Harriette Ohara, Olumiant, or Rinvoq If you take Actemra or Kevzara, you DO NOT need to hold these for COVID19 infection.  If you have signs or symptoms of an infection or start antibiotics: First, call your PCP for workup of your  infection. Hold your medication through the infection, until you complete your antibiotics, and until symptoms resolve if you take the following: Injectable medication (Actemra, Benlysta, Cimzia, Cosentyx, Enbrel, Humira, Kevzara, Orencia, Remicade, Simponi, Stelara, Taltz, Tremfya) Methotrexate Leflunomide (Arava) Mycophenolate (Cellcept) Harriette Ohara, Olumiant, or Rinvoq

## 2020-09-05 NOTE — Telephone Encounter (Signed)
Submitted a Prior Authorization request to [facility] for RINVOQ via CoverMyMeds. Will update once we receive a response.   Key: Michele Bryan

## 2020-09-05 NOTE — Telephone Encounter (Signed)
Called patient and advised patient to discontinue vitamin D since her calcium is elevated currently, per [name]. Also advised patient that we have mailed AVS which contains additional information about the new medications. Patient verbalized understanding.

## 2020-09-05 NOTE — Progress Notes (Signed)
Pharmacy Note  Subjective: [name] and her son present today to [facility] for follow up office visit. Patient seen by the pharmacist for counseling on Rinvoq  and Actemra for rheumatoid arthritis..  Previous therapy includes: Dub Amis which she started in September 2019.    Unable to take TNF inhibitors d/t personal history of MS  History of diverticulitis:  No history of diverticulitis but does have history of diverticulosis. She sees Dr. Ritta Slot, GI, at [facility].Her husband states that the workup has been inconclusive thus far. Last appointment was 6-9 months ago and no f/u is yet scheduled. She was supposed to have a colonoscopy completed about 12-15 months ago but was unable to tolerate the bowel prep and ultimately cancelled. She states that her GI symptoms have slightly improved in the past 5-6 months  History of MI, stroke, or CV events:  No. Her father died from MI when he was 107 y/o but no personal or family history of stroke, MI, DVT, PE  Objective:  CMP     Component Value Date/Time   NA 138 08/17/2020 1312   K 4.4 08/17/2020 1312   CL 104 08/17/2020 1312   CO2 27 08/17/2020 1312   GLUCOSE 84 08/17/2020 1312   BUN 22 08/17/2020 1312   CREATININE 0.91 08/17/2020 1312   CREATININE 0.67 04/03/2020 1030   CALCIUM 10.7 (H) 08/17/2020 1312   PROT 6.1 (L) 08/17/2020 1312   ALBUMIN 3.7 08/17/2020 1312   AST 16 08/17/2020 1312   ALT 12 08/17/2020 1312   ALKPHOS 124 08/17/2020 1312    CBC    Component Value Date/Time   WBC 4.5 08/17/2020 1312   RBC 3.62 (L) 08/17/2020 1312   HGB 10.3 (L) 08/17/2020 1312   HCT 33.7 (L) 08/17/2020 1312   PLT 220 08/17/2020 1312   MCV 93.1 08/17/2020 1312   MCH 28.5 08/17/2020 1312   MCHC 30.6 08/17/2020 1312   RDW 13.5 08/17/2020 1312    Baseline Immunosuppressant Therapy Labs TB GOLD Quantiferon TB Gold Latest Ref Rng & Units 01/03/2020  Quantiferon TB Gold Plus NEGATIVE NEGATIVE   Hepatitis Panel   HIV Lab  Results  Component Value Date   HIV NON-REACTIVE 03/19/2017   Immunoglobulins Immunoglobulin Electrophoresis Latest Ref Rng & Units 03/19/2017  IgA  81 - 463 mg/dL 062  IgG 694 - 8,546 mg/dL 270  IgM 48 - 350 mg/dL 67   SPEP Serum Protein Electrophoresis Latest Ref Rng & Units 08/17/2020  Total Protein 6.5 - 8.1 g/dL 6.1(L)  Albumin 3.8 - 4.8 g/dL -  Alpha-1 0.2 - 0.3 g/dL -  Alpha-2 0.5 - 0.9 g/dL -  Beta Globulin 0.4 - 0.6 g/dL -  Beta 2 0.2 - 0.5 g/dL -  Gamma Globulin 0.8 - 1.7 g/dL -   Assessment/Plan:  Counseled patient that Actemra is an IL-6 blocking agent.  Counseled patient on purpose, proper use, and adverse effects of Actemra.  Reviewed the most common adverse effects including infections, infusion reactions, bowel injury, and rarely cancer and conditions of the nervous system.  Reviewed risk of lipid abnormalities and elevated liver enzymes. Discussed that there is the possibility of an increased risk of malignancy but it is not well understood if this increased risk is due to the medication or the disease state. Counseled patient that Actemra should be held prior to scheduled surgery for infections or new antibiotic use. Counseled patient that Actemra should be held prior to scheduled surgery. Recommend annual influenza, PCV 15  or PCV20 or Pneumovax 23, and Shingrix as indicated.  Reviewed the importance of regular labs while on Actemra therapy including the need for routine lipid panel..  Provided patient with medication education material and answered all questions.  Patient voiced understanding.    Counseled patient that Rinvoq is a JAK inhibitor indicated for Rheumatoid Arthritis.  Counseled patient on purpose, proper use, and adverse effects of Rinvoq.    Reviewed the most common adverse effects including infection, diarrhea, headaches.  Also reviewed rare adverse effects such as bowel injury and the need to contact us if they develop stomach pain during treatment.  Counseled on the increase risk of venous thrombosis. Counseled about FDA black box warning of MACE (major adverse CV events including cardiovascular death, myocardial infarction, and stroke).  She does not have personal history of CV events and no smoking history. Reviewed with patient that there is the possibility of an increased risk of malignancy specifically lung cancer and lymphomas but it is not well understood if this increased risk is due to the medication or the disease state. Instructed patient that medication should be held for infection and prior to surgery.  Advised patient to avoid live vaccines. Recommend annual influenza, PCV 15 or PCV20 or Pneumovax 23, and Shingrix as indicated.    Reviewed importance of routine lab monitoring including lipid panel.  Will recheck lipid panel 3 months after starting and annually thereafter. CBC and CMP will be monitored routinely every 3 months. Standing orders placed. Provided patient with medication education material and answered all questions.  Patient consented to Rinvoq since this is what they chose vs Actemra and since she will not have to come to the clinic to start oral treatment.  Will upload into patient's chart.   She currently has 3 Orencia auto-injector pens remaining at home and will continue taking until GI workup is cleared.   After review of side effects of both medications, they prefer to pursue Rinvoq since she will feel improvement sooner than Actemra. They verbalized understanding of risks vs benefits of both and would like to pursue Rinvoq. She currently pays $100 through insurance for Orencia and is comfortable with this same copay for Rinvoq.  Patient dose will be 15 mg daily.  Prescription will be sent to pharmacy pending GI workup and insurance approval. They will plan to call GI doctor today to be scheduled for appointment ASAP - I've advised that they have GI doctor fax any pertinent resolution to our clinic  In the meantime,  they requested she start hydroxychloroquine for even slight improvement though [name] and I both reviewed that she may feel no improvement. We will start hydroxychloroquine 200mg  once daily (recorded weight of 49.6kg) and send in prednisone taper of 10mg  daily x 7 days, 7.5mg  daily x 7 day, 5 mg daily x 7 days, and 2.5 mg daily x 7 days while workup with GI is ongoing.  They are amenable to investigating insurance benefit for Rinvoq so coverage is in place. They will reach back out to me once she has been cleared.  , PharmD, MPH Clinical Pharmacist (Rheumatology and Pulmonology)

## 2020-09-06 NOTE — Telephone Encounter (Signed)
Called patient and husband to notify about Rinvoq approval. Husband states that they've requested appointment with GI and have not yet heard back. Advised that when she is cleared by GI, they can reach out to [place] to start Rinvoq.  Chesley Mires, PharmD, MPH Clinical Pharmacist (Rheumatology and Pulmonology)

## 2020-09-13 ENCOUNTER — Other Ambulatory Visit: Payer: Self-pay | Admitting: Physical Medicine & Rehabilitation

## 2020-09-24 ENCOUNTER — Other Ambulatory Visit (HOSPITAL_COMMUNITY): Payer: Self-pay

## 2020-09-25 ENCOUNTER — Other Ambulatory Visit (HOSPITAL_COMMUNITY): Payer: Self-pay

## 2020-09-27 ENCOUNTER — Other Ambulatory Visit (HOSPITAL_COMMUNITY): Payer: Self-pay

## 2020-09-28 ENCOUNTER — Other Ambulatory Visit (HOSPITAL_COMMUNITY): Payer: Self-pay

## 2020-10-01 ENCOUNTER — Other Ambulatory Visit (HOSPITAL_COMMUNITY): Payer: Self-pay

## 2020-10-03 DIAGNOSIS — L821 Other seborrheic keratosis: Secondary | ICD-10-CM | POA: Diagnosis not present

## 2020-10-03 DIAGNOSIS — D1801 Hemangioma of skin and subcutaneous tissue: Secondary | ICD-10-CM | POA: Diagnosis not present

## 2020-10-03 DIAGNOSIS — D692 Other nonthrombocytopenic purpura: Secondary | ICD-10-CM | POA: Diagnosis not present

## 2020-10-03 DIAGNOSIS — S129XXA Fracture of neck, unspecified, initial encounter: Secondary | ICD-10-CM | POA: Diagnosis not present

## 2020-10-03 DIAGNOSIS — D2271 Melanocytic nevi of right lower limb, including hip: Secondary | ICD-10-CM | POA: Diagnosis not present

## 2020-10-05 NOTE — Telephone Encounter (Signed)
Called patient for update on GI clearance to start Rinvoq. Husband states he had forgotten to follow-up and will call them today for an appointment.  Chesley Mires, PharmD, MPH, BCPS Clinical Pharmacist (Rheumatology and Pulmonology)

## 2020-10-08 ENCOUNTER — Other Ambulatory Visit (HOSPITAL_COMMUNITY): Payer: Self-pay

## 2020-10-09 DIAGNOSIS — M0579 Rheumatoid arthritis with rheumatoid factor of multiple sites without organ or systems involvement: Secondary | ICD-10-CM | POA: Diagnosis not present

## 2020-10-09 DIAGNOSIS — R1032 Left lower quadrant pain: Secondary | ICD-10-CM | POA: Diagnosis not present

## 2020-10-09 DIAGNOSIS — K5903 Drug induced constipation: Secondary | ICD-10-CM | POA: Diagnosis not present

## 2020-10-10 NOTE — Progress Notes (Signed)
Office Visit Note  Patient: Michele Bryan             Date of Birth: 10-11-1940           MRN: [id number]             PCP: Sigmund Hazel, MD Referring: Sigmund Hazel, MD Visit Date: 10/11/2020 Occupation: @  Subjective:  Discuss medication options   History of Present Illness: Michele Bryan is a 80 y.o. female with history of seropositive rheumatoid arthritis and osteoarthritis.  She remains on Plaquenil as prescribed.  She presents today to discuss switching from Orencia to Rinvoq.  Her last dose of Orencia was on Tuesday.  Consent to start on Rinvoq was obtained at her last office visit.  She does not have any further questions prior to starting on Rinvoq.  She will be completing the prednisone taper next week.  She has noticed a significant improvement in her joint pain and inflammation while taking prednisone, but she continues to have pain and swelling in both hands.   Activities of Daily Living:  Patient reports morning stiffness for 30 minutes.   Patient Denies nocturnal pain.  Difficulty dressing/grooming: Denies Difficulty climbing stairs: Denies Difficulty getting out of chair: Denies Difficulty using hands for taps, buttons, cutlery, and/or writing: Reports  Review of Systems  Constitutional:  Positive for fatigue.  HENT:  Negative for mouth sores, mouth dryness and nose dryness.   Eyes:  Negative for pain, itching and dryness.  Respiratory:  Negative for shortness of breath and difficulty breathing.   Cardiovascular:  Negative for chest pain and palpitations.  Gastrointestinal:  Negative for blood in stool, constipation and diarrhea.  Endocrine: Negative for increased urination.  Genitourinary:  Negative for difficulty urinating.  Musculoskeletal:  Positive for joint pain, joint pain, joint swelling and morning stiffness. Negative for myalgias, muscle tenderness and myalgias.  Skin:  Negative for color change, rash and redness.  Allergic/Immunologic: Negative  for susceptible to infections.  Neurological:  Negative for dizziness, numbness, headaches, memory loss and weakness.  Hematological:  Positive for bruising/bleeding tendency.  Psychiatric/Behavioral:  Negative for confusion.    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  05/06/2016   High risk medication use 05/06/2016   Primary osteoarthritis of both hands 05/06/2016   Primary osteoarthritis of left knee 05/06/2016   Primary osteoarthritis of both feet 05/06/2016   Dyslipidemia 05/06/2016   Peripheral neuropathy 05/06/2016   Diverticulosis of intestine without bleeding 05/06/2016   Osteopenia  05/06/2016   Vitamin D deficiency 05/06/2016   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   History of GI bleed 05/06/2016   Cataract of both eyes 05/06/2016   Urinary urgency 04/30/2015   Chronic constipation 04/30/2015   MS (multiple sclerosis) (HCC) 03/23/2013   Expected blood loss anemia 06/16/2012   S/P right TKA 06/15/2012    Past Medical History:  Diagnosis Date   Anemia    Arthritis  GERD (gastroesophageal reflux disease)    History of blood transfusion    Hyperlipidemia    Hypertension    MS (multiple sclerosis) (HCC)    Neuromuscular disorder (HCC)    multiple scleroosis/peripheral neuropathy   PONV (postoperative nausea and vomiting)    Rheumatoid arthritis (HCC)    Ulcer    Vitamin D deficiency     Family History  Problem  Relation Age of Onset   Heart attack Father    Breast cancer Neg Hx    Past Surgical History:  Procedure Laterality Date   APPLICATION OF INTRAOPERATIVE CT SCAN N/A 02/16/2020   Procedure: APPLICATION OF INTRAOPERATIVE CT SCAN;  Surgeon: Jadene Pierini, MD;  Location: [facility] OR;  Service: Neurosurgery;  Laterality: N/A;   AUGMENTATION MAMMAPLASTY     BACK SURGERY     breast augumentation     BREAST SURGERY     biopsy   COLONOSCOPY     DILATION AND CURETTAGE OF UTERUS     EYE SURGERY     both eys,cataracts   goiter     HALO APPLICATION N/A 02/16/2020   Procedure: HALO TRACTION APPLICATION;  Surgeon: Jadene Pierini, MD;  Location: [facility] OR;  Service: Neurosurgery;  Laterality: N/A;   JOINT REPLACEMENT     KNEE ARTHROSCOPY Right    ORIF SHOULDER FRACTURE Left 03/05/2018   Procedure: OPEN REDUCTION INTERNAL FIXATION (ORIF) LEFT SCAPULA;  Surgeon: Beverely Low, MD;  Location: [facility] OR;  Service: Orthopedics;  Laterality: Left;   POSTERIOR CERVICAL FUSION/FORAMINOTOMY N/A 12/06/2019   Procedure: Cervical one-two Posterior instrumented fusion;  Surgeon: Jadene Pierini, MD;  Location: [facility] OR;  Service: Neurosurgery;  Laterality: N/A;   POSTERIOR CERVICAL FUSION/FORAMINOTOMY N/A 02/13/2020   Procedure: Revision of cervical instrumented fusion with Occiput to Cervical Four posterior instrumented fusion;  Surgeon: Jadene Pierini, MD;  Location: [facility] OR;  Service: Neurosurgery;  Laterality: N/A;  posterior   POSTERIOR CERVICAL FUSION/FORAMINOTOMY N/A 02/16/2020   Procedure: Occiput to Thoracic Two Posterior Cervical Fusion with AIRO and Application of Halo;  Surgeon: Jadene Pierini, MD;  Location: [facility] OR;  Service: Neurosurgery;  Laterality: N/A;   REVERSE SHOULDER ARTHROPLASTY Left 05/15/2017   Procedure: LEFT REVERSE SHOULDER ARTHROPLASTY;  Surgeon: Beverely Low, MD;  Location: [facility] OR;  Service: Orthopedics;  Laterality: Left;   REVERSE SHOULDER ARTHROPLASTY Left 10/11/2018   Procedure:  left shoulder irrigation and debridement, open poly exchange and removal of painful hardware;  Surgeon: Beverely Low, MD;  Location: [facility] ORS;  Service: Orthopedics;  Laterality: Left;   THYROID LOBECTOMY     TOTAL KNEE ARTHROPLASTY Right 06/15/2012   Procedure: RIGHT TOTAL KNEE ARTHROPLASTY;  Surgeon: [name], MD;  Location: [facility] ORS;  Service: Orthopedics;  Laterality: Right;   WRIST SURGERY Left    Social History   Social History Narrative   Patient lives at home with her husband [name]).   Retired.   Education- college.   Caffeine- Two cups of decaf tea daily.   Right handed.         Immunization History  Administered Date(s) Administered   Influenza, High Dose Seasonal PF 12/03/2013   Influenza,inj,quad, With Preservative 11/23/2014   Influenza-Unspecified 12/19/2017   PFIZER(Purple Top)SARS-COV-2 Vaccination 04/16/2019, 05/29/2019, 01/09/2020   Td 12/15/2017   Zoster Recombinat (Shingrix) 08/10/2017, 01/05/2018     Objective: Vital Signs: BP (!) 143/79 (BP Location: Left Arm, Patient Position: Sitting, Cuff Size: Normal)   Pulse 67   Ht 5' 0.5" (1.537 m)  Wt 111 lb (50.3 kg)   BMI 21.32 kg/m    Physical Exam Vitals and nursing note reviewed.  Constitutional:      Appearance: She is well-developed.  HENT:     Head: Normocephalic and atraumatic.  Eyes:     Conjunctiva/sclera: Conjunctivae normal.  Pulmonary:     Effort: Pulmonary effort is normal.  Abdominal:     Palpations: Abdomen is soft.  Musculoskeletal:     Cervical back: Normal range of motion.  Skin:    General: Skin is warm and dry.     Capillary Refill: Capillary refill takes less than 2 seconds.  Neurological:     Mental Status: She is alert and oriented to person, place, and time.  Psychiatric:        Behavior: Behavior normal.     Musculoskeletal Exam: C-spine limited range of motion.  Right shoulder has full range of motion.  Left shoulder limited abduction to about 30 degrees.  Elbow  joints have good range of motion with no tenderness or joint swelling.  Wrist joints have good range of motion with no tenderness.  Synovial thickening over the left wrist.  Synovitis over multiple MCP and PIP joints as described below.  Hip joints difficult to assess well in seated position.  Right knee replacement has good range of motion with no discomfort.  Left knee joint has good range of motion with no warmth or effusion.  Tenderness of both ankle joints.  Pedal edema noted bilaterally.  CDAI Exam: CDAI Score: -- Patient Global: --; Provider Global: -- Swollen: --; Tender: -- Joint Exam 10/11/2020   No joint exam has been documented for this visit   There is currently no information documented on the homunculus. Go to the Rheumatology activity and complete the homunculus joint exam.  Investigation: No additional findings.  Imaging: No results found.  Recent Labs: Lab Results  Component Value Date   WBC 4.5 08/17/2020   HGB 10.3 (L) 08/17/2020   PLT 220 08/17/2020   NA 138 08/17/2020   K 4.4 08/17/2020   CL 104 08/17/2020   CO2 27 08/17/2020   GLUCOSE 84 08/17/2020   BUN 22 08/17/2020   CREATININE 0.91 08/17/2020   BILITOT 0.8 08/17/2020   ALKPHOS 124 08/17/2020   AST 16 08/17/2020   ALT 12 08/17/2020   PROT 6.1 (L) 08/17/2020   ALBUMIN 3.7 08/17/2020   CALCIUM 10.7 (H) 08/17/2020   GFRAA 98 04/03/2020   QFTBGOLDPLUS NEGATIVE 01/03/2020    Speciality Comments: PLQ Eye Exam: 07/30/17 WNL @ [facility] Osteoporosis managed by PCP-[facility] 10/12/2018  Procedures:  No procedures performed Allergies: Meperidine hcl, Codeine, and Demerol [meperidine]   Assessment / Plan:     Visit Diagnoses: 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  - She has been experiencing recurrent severe flares of rheumatoid arthritis over the past several months.  She has been on Orencia 125 mg subcutaneous injections once weekly and was  restarted on Plaquenil 200 mg 1 tablet by mouth daily at her last office visit on 09/05/2020.  Consent to switch her from Orencia to Rinvoq was obtained at her last office visit.  We have been awaiting clearance by her gastroenterologist due to her history of diverticulosis and a GI bleed.  We received GI clearance and have received approval through her insurance to start on Rinvoq.  She was prescribed a prednisone taper starting at 20 mg tapering by 5 mg every week after her last office visit.  She will be completing the taper next week.  She was advised to discontinue Orencia.  She can remain on Plaquenil as prescribed.  A prescription for Rinvoq 15 mg 1 tablet by mouth daily will be sent to the pharmacy today.  The indications, contraindications, potential side effects of Rinvoq were discussed today in detail and all questions were addressed.  She will require lab work in 1 month and every 3 months after starting on Rinvoq.  Standing orders for CBC and CMP remain in place.  Future order for a lipid panel was placed today.  She will follow up in 6-8 weeks to assess her response.   Counseled patient that Rinvoq is a JAK inhibitor indicated for Rheumatoid Arthritis.  Counseled patient on purpose, proper use, and adverse effects of Rinvoq.    Reviewed the most common adverse effects including infection, diarrhea, headaches.  Also reviewed rare adverse effects such as bowel injury and the need to contact us if they develop stomach pain during treatment. Counseled on the increase risk of venous thrombosis. Counseled about FDA black box warning of MACE (major adverse CV events including cardiovascular death, myocardial infarction, and stroke).  Reviewed with patient that there is the possibility of an increased risk of malignancy specifically lung cancer and lymphomas but it is not well understood if this increased risk is due to the medication or the disease state. Instructed patient that medication should be held for  infection and prior to surgery.  Advised patient to avoid live vaccines. Recommend annual influenza, PCV 15 or PCV20 or Pneumovax 23, and Shingrix as indicated.    Reviewed importance of routine lab monitoring including lipid panel.  Will recheck lipid panel 3 months after starting and annually thereafter. CBC and CMP will be monitored routinely every 3 months. Standing orders placed. Provided patient with medication education material and answered all questions.  Patient consented to Rinvoq.  Will upload into patient's chart.  Will apply through patient's insurance and update when we receive a response.    Patient dose will be 15 mg daily.  Prescription will be sent to pharmacy pending lab results and insurance approval.  High risk medication use -She will be starting on Rinvoq 15 mg 1 tablet by mouth daily and continue taking Plaquenil 200 mg 1 tablet by mouth once daily.  She was advised to discontinue Orencia due to inadequate response.  (She was on Orencia since 12/08/17).  Previously discontinued methotrexate due to developing shortness of breath.  She has not a candidate for TNF inhibitors due to history of multiple sclerosis.  She has been cleared by her gastroenterologist to proceed with Rinvoq.   PLQ Eye Exam: 07/30/17.  She will require an updated Plaquenil eye exam since she restarted taking Plaquenil after her last office visit on 09/05/2020. CBC and CMP were drawn on 08/17/2020.  She will return for lab work 1 month and every 3 months after starting on Rinvoq.  Future order for lipid panel was placed today.- Plan: CBC with Differential/Platelet, COMPLETE METABOLIC PANEL WITH GFR, Lipid panel Patient is aware of the black box warning associated with Jak inhibitors including increased risk for MACE and malignancy.  Primary osteoarthritis of both hands: She has severe PIP and DIP thickening consistent with osteoarthritis of both hands.  She continues to experience chronic pain and stiffness in both  hands.  She has difficulty making a complete fist bilaterally.  H/O total shoulder replacement, left - She had surgery by Dr. Devonne Doughty in the past.  Chronic pain and limited range of motion.  Status post right knee replacement: Doing well.  She has good range of motion with no discomfort at this time.  Primary osteoarthritis of left knee: She experiences occasional discomfort in her left knee joint.  She has good range of motion with no warmth or effusion on examination today.  Primary osteoarthritis of both feet: She experiences intermittent pain and swelling in her ankle joints.  She has pedal edema on examination today.  Closed fracture of cervical vertebra, unspecified cervical vertebral level, sequela - Patient had a fall in January 2020 at which time she fractured C1 and C2 with displacement requiring fusion on 12/06/2019.  Osteopenia of multiple sites - on forteo. DEXA on 02/22/18: The BMD measured at Fem[facility] Total Right is 0.742 g/cm2 with a T-score of -2.1.  Forteo was a started in February 2022.  She is overdue to update her bone density.  A future order was placed by Dr. Hyacinth Meeker in February 2022.  She will continue to take vitamin D 5000 units daily.  History of vitamin D deficiency: She is taking vitamin D 5000 units daily.  Postural kyphosis of thoracic region: Unchanged.  History of GI bleed: Cleared by gastroenterologist to proceed with Rinvoq.  History of multiple sclerosis (HCC): She has not a good candidate for TNF inhibitors  History of diverticulosis: Cleared by her gastroenterologist on 10/09/2020 to proceed with Rinvoq.  Dyslipidemia: Future order for lipid panel placed today.  Other medical conditions are listed as follows:  History of cataract  History of peripheral neuropathy  Orders: Orders Placed This Encounter  Procedures   CBC with Differential/Platelet   COMPLETE METABOLIC PANEL WITH GFR   Lipid panel   Meds ordered this encounter  Medications    Upadacitinib ER (RINVOQ) 15 MG TB24    Sig: Take 15 mg by mouth daily.    Dispense:  90 tablet    Refill:  0     Follow-Up Instructions: Return in about 8 weeks (around 12/06/2020) for Rheumatoid arthritis, Osteoarthritis.   Gearldine Bienenstock, PA-C  Note - This record has been created using Dragon software.  Chart creation errors have been sought, but may not always  have been located. Such creation errors do not reflect on  the standard of medical care.

## 2020-10-11 ENCOUNTER — Other Ambulatory Visit: Payer: Self-pay

## 2020-10-11 ENCOUNTER — Ambulatory Visit: Payer: Medicare PPO | Admitting: Physician Assistant

## 2020-10-11 ENCOUNTER — Other Ambulatory Visit (HOSPITAL_COMMUNITY): Payer: Self-pay

## 2020-10-11 ENCOUNTER — Encounter: Payer: Self-pay | Admitting: Physician Assistant

## 2020-10-11 VITALS — BP 143/79 | HR 67 | Ht 60.5 in | Wt 111.0 lb

## 2020-10-11 DIAGNOSIS — M1712 Unilateral primary osteoarthritis, left knee: Secondary | ICD-10-CM | POA: Diagnosis not present

## 2020-10-11 DIAGNOSIS — M8589 Other specified disorders of bone density and structure, multiple sites: Secondary | ICD-10-CM

## 2020-10-11 DIAGNOSIS — Z96612 Presence of left artificial shoulder joint: Secondary | ICD-10-CM

## 2020-10-11 DIAGNOSIS — Z8719 Personal history of other diseases of the digestive system: Secondary | ICD-10-CM

## 2020-10-11 DIAGNOSIS — S129XXS Fracture of neck, unspecified, sequela: Secondary | ICD-10-CM

## 2020-10-11 DIAGNOSIS — G35 Multiple sclerosis: Secondary | ICD-10-CM

## 2020-10-11 DIAGNOSIS — Z96651 Presence of right artificial knee joint: Secondary | ICD-10-CM

## 2020-10-11 DIAGNOSIS — M19071 Primary osteoarthritis, right ankle and foot: Secondary | ICD-10-CM | POA: Diagnosis not present

## 2020-10-11 DIAGNOSIS — Z79899 Other long term (current) drug therapy: Secondary | ICD-10-CM

## 2020-10-11 DIAGNOSIS — M19042 Primary osteoarthritis, left hand: Secondary | ICD-10-CM

## 2020-10-11 DIAGNOSIS — Z8639 Personal history of other endocrine, nutritional and metabolic disease: Secondary | ICD-10-CM

## 2020-10-11 DIAGNOSIS — Z8669 Personal history of other diseases of the nervous system and sense organs: Secondary | ICD-10-CM

## 2020-10-11 DIAGNOSIS — M19072 Primary osteoarthritis, left ankle and foot: Secondary | ICD-10-CM

## 2020-10-11 DIAGNOSIS — M19041 Primary osteoarthritis, right hand: Secondary | ICD-10-CM | POA: Diagnosis not present

## 2020-10-11 DIAGNOSIS — E785 Hyperlipidemia, unspecified: Secondary | ICD-10-CM

## 2020-10-11 DIAGNOSIS — M0579 Rheumatoid arthritis with rheumatoid factor of multiple sites without organ or systems involvement: Secondary | ICD-10-CM

## 2020-10-11 DIAGNOSIS — M4004 Postural kyphosis, thoracic region: Secondary | ICD-10-CM

## 2020-10-11 MED ORDER — RINVOQ 15 MG PO TB24
15.0000 mg | ORAL_TABLET | Freq: Every day | ORAL | 0 refills | Status: DC
  Filled 2020-10-11: qty 90, fill #0
  Filled 2020-10-12: qty 30, 30d supply, fill #0

## 2020-10-11 NOTE — Progress Notes (Signed)
Pharmacy Note  Subjective: Patient presents today to [facility] for follow up office visit. Patient seen by the pharmacist for counseling on Rinvoq for rheumatoid arthritis..  Previous therapy include:Orencia (waning clinical response). Her last Orencia dose was on 10/09/20. She has history of dyslipidemia.  Patient cleared by GI on 10/09/20 to start Rinvoq.  History of MI, stroke, or CV events:  No. Her father died from MI when he was 54 y/o but no other personal or family history of stroke, MI, DVT, PE  Objective:  CMP     Component Value Date/Time   NA 138 08/17/2020 1312   K 4.4 08/17/2020 1312   CL 104 08/17/2020 1312   CO2 27 08/17/2020 1312   GLUCOSE 84 08/17/2020 1312   BUN 22 08/17/2020 1312   CREATININE 0.91 08/17/2020 1312   CREATININE 0.67 04/03/2020 1030   CALCIUM 10.7 (H) 08/17/2020 1312   PROT 6.1 (L) 08/17/2020 1312   ALBUMIN 3.7 08/17/2020 1312   AST 16 08/17/2020 1312   ALT 12 08/17/2020 1312   ALKPHOS 124 08/17/2020 1312    CBC    Component Value Date/Time   WBC 4.5 08/17/2020 1312   RBC 3.62 (L) 08/17/2020 1312   HGB 10.3 (L) 08/17/2020 1312   HCT 33.7 (L) 08/17/2020 1312   PLT 220 08/17/2020 1312   MCV 93.1 08/17/2020 1312   MCH 28.5 08/17/2020 1312   MCHC 30.6 08/17/2020 1312   RDW 13.5 08/17/2020 1312    Baseline Immunosuppressant Therapy Labs TB GOLD Quantiferon TB Gold Latest Ref Rng & Units 01/03/2020  Quantiferon TB Gold Plus NEGATIVE NEGATIVE   Hepatitis Panel   HIV Lab Results  Component Value Date   HIV NON-REACTIVE 03/19/2017   Immunoglobulins Immunoglobulin Electrophoresis Latest Ref Rng & Units 03/19/2017  IgA  81 - 463 mg/dL 196  IgG 222 - 9,798 mg/dL 921  IgM 48 - 194 mg/dL 67   SPEP Serum Protein Electrophoresis Latest Ref Rng & Units 08/17/2020  Total Protein 6.5 - 8.1 g/dL 6.1(L)  Albumin 3.8 - 4.8 g/dL -  Alpha-1 0.2 - 0.3 g/dL -  Alpha-2 0.5 - 0.9 g/dL -  Beta Globulin 0.4 - 0.6 g/dL -  Beta 2 0.2 -  0.5 g/dL -  Gamma Globulin 0.8 - 1.7 g/dL -    Assessment/Plan: Reviewed Rinvoq with patient and husband extensively at last OV on 09/05/20. Patient had consented to start Rinvoq at that visit. They do not have questions at this time and are eager to start Rinvoq.  She will complete prednisone taper as prescribed.  Rx for Rinvoq sent to [facility].  Patient will plan to discard Orencia pens since the box is opened and pharmacy is unable to take opened boxes.  Chesley Mires, PharmD, MPH, BCPS Clinical Pharmacist (Rheumatology and Pulmonology)

## 2020-10-11 NOTE — Patient Instructions (Signed)
Standing Labs We placed an order today for your standing lab work.   Please have your standing labs drawn in 1 month and every 3 months   If possible, please have your labs drawn 2 weeks prior to your appointment so that the provider can discuss your results at your appointment.  Please note that you may see your imaging and lab results in MyChart before we have reviewed them. We may be awaiting multiple results to interpret others before contacting you. Please allow our office up to 72 hours to thoroughly review all of the results before contacting the office for clarification of your results.  We have open lab daily: Monday through Thursday from 1:30-4:30 PM and Friday from 1:30-4:00 PM at the office of Dr. Shaili Deveshwar, [facility].   Please be advised, all patients with office appointments requiring lab work will take precedent over walk-in lab work.  If possible, please come for your lab work on Monday and Friday afternoons, as you may experience shorter wait times. The office is located at [address], [address], [place], [place] [address] No appointment is necessary.   Labs are drawn by [facility]. Please bring your co-pay at the time of your lab draw.  You may receive a bill from [facility] for your lab work.  If you wish to have your labs drawn at another location, please call the office 24 hours in advance to send orders.  If you have any questions regarding directions or hours of operation,  please call [phone].   As a reminder, please drink plenty of water prior to coming for your lab work. Thanks!  

## 2020-10-12 ENCOUNTER — Other Ambulatory Visit (HOSPITAL_COMMUNITY): Payer: Self-pay

## 2020-10-12 NOTE — Telephone Encounter (Signed)
Patient had OV on 10/11/20 with Sherron Ales, PA-C. Patient was cleared by GI to start Rinvoq. Rx sent to [facility] at OV. Orencia d/c. Patient aware of copay.  Nothing further needed.  Chesley Mires, PharmD, MPH, BCPS Clinical Pharmacist (Rheumatology and Pulmonology)

## 2020-10-12 NOTE — Telephone Encounter (Signed)
Shipment will deliver to patient by 8/16

## 2020-10-18 ENCOUNTER — Other Ambulatory Visit (HOSPITAL_COMMUNITY): Payer: Self-pay

## 2020-10-19 DIAGNOSIS — G4700 Insomnia, unspecified: Secondary | ICD-10-CM | POA: Diagnosis not present

## 2020-10-19 DIAGNOSIS — I10 Essential (primary) hypertension: Secondary | ICD-10-CM | POA: Diagnosis not present

## 2020-10-19 DIAGNOSIS — M069 Rheumatoid arthritis, unspecified: Secondary | ICD-10-CM | POA: Diagnosis not present

## 2020-10-19 DIAGNOSIS — R600 Localized edema: Secondary | ICD-10-CM | POA: Diagnosis not present

## 2020-10-19 DIAGNOSIS — E7800 Pure hypercholesterolemia, unspecified: Secondary | ICD-10-CM | POA: Diagnosis not present

## 2020-10-23 ENCOUNTER — Other Ambulatory Visit (HOSPITAL_COMMUNITY): Payer: Self-pay | Admitting: Internal Medicine

## 2020-10-24 ENCOUNTER — Other Ambulatory Visit (HOSPITAL_COMMUNITY): Payer: Self-pay

## 2020-10-25 ENCOUNTER — Telehealth: Payer: Self-pay | Admitting: *Deleted

## 2020-10-25 NOTE — Telephone Encounter (Signed)
Patient states she is on Rinvoq and has been on it for about 9 days. Patient staes she is having really bad diarrhea since starting the medication. Patient states she is also having lower abdominal pain. Please advise.

## 2020-10-25 NOTE — Telephone Encounter (Signed)
Patient advised  to stop Rinvoq.  Patient advised once the diarrhea stops she can come in to get labs which will include CBC with differential and CMP with GFR.  Patient advised she will also need an appointment to discuss Xeljanz.  We will plan on starting her on Xeljanz 5 mg p.o. daily. Patient expressed understanding. Patient will schedule appointment once she comes in for labs she will schedule her follow up appointment.

## 2020-10-25 NOTE — Telephone Encounter (Signed)
Please have patient to stop Rinvoq.  Once the diarrhea stops she can come in to get labs which will include CBC with differential and CMP with GFR.  She will also need an appointment to discuss Xeljanz.  We will plan on starting her on Xeljanz 5 mg p.o. daily.

## 2020-10-29 ENCOUNTER — Other Ambulatory Visit: Payer: Self-pay | Admitting: *Deleted

## 2020-10-29 DIAGNOSIS — Z79899 Other long term (current) drug therapy: Secondary | ICD-10-CM | POA: Diagnosis not present

## 2020-10-30 LAB — CBC WITH DIFFERENTIAL/PLATELET
Absolute Monocytes: 678 cells/uL (ref 200–950)
Basophils Absolute: 50 cells/uL (ref 0–200)
Basophils Relative: 0.9 %
Eosinophils Absolute: 118 cells/uL (ref 15–500)
Eosinophils Relative: 2.1 %
HCT: 32.6 % — ABNORMAL LOW (ref 35.0–45.0)
Hemoglobin: 10.3 g/dL — ABNORMAL LOW (ref 11.7–15.5)
Lymphs Abs: 1949 cells/uL (ref 850–3900)
MCH: 28.8 pg (ref 27.0–33.0)
MCHC: 31.6 g/dL — ABNORMAL LOW (ref 32.0–36.0)
MCV: 91.1 fL (ref 80.0–100.0)
MPV: 9.5 fL (ref 7.5–12.5)
Monocytes Relative: 12.1 %
Neutro Abs: 2806 cells/uL (ref 1500–7800)
Neutrophils Relative %: 50.1 %
Platelets: 248 10*3/uL (ref 140–400)
RBC: 3.58 10*6/uL — ABNORMAL LOW (ref 3.80–5.10)
RDW: 12.5 % (ref 11.0–15.0)
Total Lymphocyte: 34.8 %
WBC: 5.6 10*3/uL (ref 3.8–10.8)

## 2020-10-30 LAB — COMPLETE METABOLIC PANEL WITH GFR
AG Ratio: 2 (calc) (ref 1.0–2.5)
ALT: 12 U/L (ref 6–29)
AST: 19 U/L (ref 10–35)
Albumin: 4.2 g/dL (ref 3.6–5.1)
Alkaline phosphatase (APISO): 130 U/L (ref 37–153)
BUN: 13 mg/dL (ref 7–25)
CO2: 28 mmol/L (ref 20–32)
Calcium: 9.6 mg/dL (ref 8.6–10.4)
Chloride: 101 mmol/L (ref 98–110)
Creat: 0.78 mg/dL (ref 0.60–1.00)
Globulin: 2.1 g/dL (calc) (ref 1.9–3.7)
Glucose, Bld: 78 mg/dL (ref 65–99)
Potassium: 5 mmol/L (ref 3.5–5.3)
Sodium: 135 mmol/L (ref 135–146)
Total Bilirubin: 0.8 mg/dL (ref 0.2–1.2)
Total Protein: 6.3 g/dL (ref 6.1–8.1)
eGFR: 78 mL/min/{1.73_m2} (ref >=60)

## 2020-10-30 NOTE — Progress Notes (Signed)
CMP WNL.  Anemia stable. We will continue to monitor.

## 2020-10-31 ENCOUNTER — Other Ambulatory Visit (HOSPITAL_COMMUNITY): Payer: Self-pay

## 2020-10-31 MED FILL — Insulin Pen Needle 31 G X 5 MM (1/5" or 3/16"): 100 days supply | Qty: 100 | Fill #1 | Status: AC

## 2020-11-06 ENCOUNTER — Other Ambulatory Visit (HOSPITAL_COMMUNITY): Payer: Self-pay

## 2020-11-07 ENCOUNTER — Other Ambulatory Visit (HOSPITAL_COMMUNITY): Payer: Self-pay

## 2020-11-13 ENCOUNTER — Other Ambulatory Visit (HOSPITAL_COMMUNITY): Payer: Self-pay

## 2020-11-14 ENCOUNTER — Other Ambulatory Visit (HOSPITAL_COMMUNITY): Payer: Self-pay

## 2020-11-15 ENCOUNTER — Other Ambulatory Visit (HOSPITAL_COMMUNITY): Payer: Self-pay

## 2020-11-19 ENCOUNTER — Encounter (HOSPITAL_COMMUNITY): Payer: Self-pay | Admitting: Internal Medicine

## 2020-11-19 ENCOUNTER — Other Ambulatory Visit: Payer: Self-pay

## 2020-11-19 ENCOUNTER — Ambulatory Visit (HOSPITAL_COMMUNITY)
Admission: RE | Admit: 2020-11-19 | Discharge: 2020-11-19 | Disposition: A | Discharge status: Home / Self Care | Payer: Medicare PPO | Source: Ambulatory Visit | Attending: Internal Medicine | Admitting: Internal Medicine

## 2020-11-19 VITALS — BP 118/78 | HR 67 | Wt 115.0 lb

## 2020-11-19 DIAGNOSIS — I5032 Chronic diastolic (congestive) heart failure: Secondary | ICD-10-CM

## 2020-11-19 DIAGNOSIS — I10 Essential (primary) hypertension: Secondary | ICD-10-CM | POA: Insufficient documentation

## 2020-11-19 DIAGNOSIS — I359 Nonrheumatic aortic valve disorder, unspecified: Secondary | ICD-10-CM

## 2020-11-19 DIAGNOSIS — R609 Edema, unspecified: Secondary | ICD-10-CM

## 2020-11-19 DIAGNOSIS — Z79899 Other long term (current) drug therapy: Secondary | ICD-10-CM | POA: Insufficient documentation

## 2020-11-19 DIAGNOSIS — M069 Rheumatoid arthritis, unspecified: Secondary | ICD-10-CM | POA: Diagnosis not present

## 2020-11-19 DIAGNOSIS — Z885 Allergy status to narcotic agent status: Secondary | ICD-10-CM | POA: Insufficient documentation

## 2020-11-19 DIAGNOSIS — G35 Multiple sclerosis: Secondary | ICD-10-CM | POA: Insufficient documentation

## 2020-11-19 DIAGNOSIS — Z8249 Family history of ischemic heart disease and other diseases of the circulatory system: Secondary | ICD-10-CM | POA: Insufficient documentation

## 2020-11-19 DIAGNOSIS — R600 Localized edema: Secondary | ICD-10-CM | POA: Diagnosis not present

## 2020-11-19 NOTE — Progress Notes (Signed)
   ReDS Vest / Clip - 11/19/20 1200       ReDS Vest / Clip   Station Marker B    Ruler Value 19    ReDS Value Range Moderate volume overload    ReDS Actual Value 40

## 2020-11-19 NOTE — Progress Notes (Addendum)
ADVANCED HF CLINIC CONSULT NOTE  Referring Physician: Dr. Hyacinth Meeker  Primary Care: Sigmund Hazel, MD Primary Cardiologist: None   HPI:  Michele Bryan is a 80 y.o. female with history of HTN, MS, RA, anemia, previous C1/C2 fracture. Referred by Dr. Hyacinth Meeker for further evaluation of LE edema.   She had fall with C1/C2 fracture in 2021. Had fusion on 12/06/19. 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 Maurice Small in 12/21. Transferred to CIR  We saw her for LE edema in 6/22 and started prn lasix. Echo ok. Takes lasix about 1x/week. Feels much better. No SOB, orthopnea or PND. Still struggling with arthitis.    Echo 6/22 EF 60-65% G2DD RV ok. Calcified Aov No AS. Personally reviewed    Past Medical History:  Diagnosis Date   Anemia    Arthritis    GERD (gastroesophageal reflux disease)    History of blood transfusion    Hyperlipidemia    Hypertension    MS (multiple sclerosis) (HCC)    Neuromuscular disorder (HCC)    multiple scleroosis/peripheral neuropathy   PONV (postoperative nausea and vomiting)    Rheumatoid arthritis (HCC)    Ulcer    Vitamin D deficiency     Current Outpatient Medications  Medication Sig Dispense Refill   acetaminophen (TYLENOL) 650 MG CR tablet Take 650 mg by mouth every 8 (eight) hours as needed for pain.     amitriptyline (ELAVIL) 50 MG tablet Take 1 tablet (50 mg total) by mouth at bedtime. 30 tablet 1   amLODipine (NORVASC) 5 MG tablet Take 1 tablet (5 mg total) by mouth daily. 30 tablet 0   Cholecalciferol (VITAMIN D) 125 MCG (5000 UT) CAPS Take 5,000 Units by mouth daily. 30 capsule 0   Cyanocobalamin (VITAMIN B-12) 5000 MCG TBDP Take 5,000 mcg by mouth daily. 30 tablet 0   docusate sodium (COLACE) 100 MG capsule Take 1 capsule (100 mg total) by mouth daily. (Patient taking differently: Take 100 mg by mouth as needed.) 10 capsule 0    furosemide (LASIX) 20 MG tablet Take 1 tablet (20 mg total) by mouth daily as needed (as needed for swelling). 15 tablet 3   gabapentin (NEURONTIN) 100 MG capsule Take 1 capsule (100 mg total) by mouth 3 (three) times daily. 90 capsule 11   hydroxychloroquine (PLAQUENIL) 200 MG tablet Take 1 tablet (200 mg total) by mouth daily. 90 tablet 0   hyoscyamine (LEVBID) 0.375 MG 12 hr tablet Take 0.375 mg by mouth every 12 (twelve) hours as needed.     hyoscyamine (LEVSIN) 0.125 MG tablet Take 0.125 mg by mouth every 4 (four) hours as needed.     Insulin Pen Needle (PEN NEEDLES) 31G X 5 MM MISC Use to inject Forteo once daily. DO NOT RE-USE. 100 each 3   Insulin Pen Needle 31G X 5 MM MISC USE TO INJECT FORTEO ONCE DAILY. DO NOT RE-USE. 100 each 3   losartan (COZAAR) 50 MG tablet Take 50 mg by mouth 2 (two) times daily.     Multiple Vitamin (MULTIVITAMIN WITH MINERALS) TABS Take 1 tablet by mouth 3 (three) times a week.      omeprazole (PRILOSEC) 40 MG capsule Take 1 capsule (40 mg total) by mouth daily. 30 capsule 0   ondansetron (ZOFRAN) 4 MG tablet as needed.     polyethylene glycol (MIRALAX / GLYCOLAX) 17 g packet Take 17  g by mouth daily. (Patient taking differently: Take 17 g by mouth daily as needed.) 14 each 0   potassium chloride SA (KLOR-CON) 20 MEQ tablet Take 1 tablet (20 mEq total) by mouth as needed (when you take Furosemide). 15 tablet 3   rosuvastatin (CRESTOR) 5 MG tablet Take by mouth.     Teriparatide, Recombinant, (FORTEO) 600 MCG/2.4ML SOPN Inject 20 mcg into the skin daily. Discard after 28 days 2.4 mL 5   traMADol (ULTRAM) 50 MG tablet Take by mouth every 6 (six) hours as needed.     vitamin E 400 UNIT capsule Take 400 Units by mouth daily.      No current facility-administered medications for this encounter.    Allergies  Allergen Reactions   Meperidine Hcl     Other reaction(s): sick nausea/vomiting   Codeine Nausea And Vomiting    Other reaction(s): sick/ nausea/vomiting    Demerol [Meperidine] Nausea And Vomiting      Social History   Socioeconomic History   Marital status: Married    Spouse name: Michele Bryan   Number of children: 3   Years of education: college   Highest education level: Not on file  Occupational History    Comment: retired  Tobacco Use   Smoking status: Never   Smokeless tobacco: Never  Vaping Use   Vaping Use: Never used  Substance and Sexual Activity   Alcohol use: No   Drug use: Never   Sexual activity: Not on file  Other Topics Concern   Not on file  Social History Narrative   Patient lives at home with her husband Michele Bryan).   Retired.   Education- college.   Caffeine- Two cups of decaf tea daily.   Right handed.         Social Determinants of Health   Financial Resource Strain: Not on file  Food Insecurity: Not on file  Transportation Needs: Not on file  Physical Activity: Not on file  Stress: Not on file  Social Connections: Not on file  Intimate Partner Violence: Not on file      Family History  Problem Relation Age of Onset   Heart attack Father    Breast cancer Neg Hx     Vitals:   11/19/20 1136  BP: 118/78  Pulse: 67  SpO2: 98%  Weight: 52.2 kg (115 lb)    PHYSICAL EXAM: General:  Well appearing. No resp difficulty HEENT: normal Neck: supple. no JVD. Carotids 2+ bilat; no bruits. No lymphadenopathy or thryomegaly appreciated. Cor: PMI nondisplaced. Regular rate & rhythm. No rubs, gallops or murmurs. Lungs: clear Abdomen: soft, nontender, nondistended. No hepatosplenomegaly. No bruits or masses. Good bowel sounds. Extremities: no cyanosis, clubbing, rash, tr edema L>R diffuse arthritc changes  Neuro: alert & orientedx3, cranial nerves grossly intact. moves all 4 extremities w/o difficulty. Affect pleasant  ASSESSMENT & PLAN:  Lower extremity edema - Echo today EF 60-65% G2DD RV ok. Calcified Aov No AS. Personally reviewed - Initial ReDS 38%. Today 40% - Suspect mix of diastolic HF and  venous insufficiency. BNP has been normal  - No spiro or ARNI with hyperkalemia - LE edema and SOB much improved with compression hose and PRN lasix. However REDs is elevated. Will schedule lasix 1x/week. Recheck in several months,   2. Calcified AoV - no significant AS.  - Will follow  - no change   Arvilla Meres, MD  12:12 PM

## 2020-11-19 NOTE — Patient Instructions (Signed)
Your physician recommends that you schedule a follow-up appointment in: 6 months, PLEASE CALL OUR OFFICE IN February 2023 TO SCHEDULE THIS  If you have any questions or concerns before your next appointment please send [place] a message through [place] or call our office at [phone].    TO LEAVE A MESSAGE FOR THE NURSE SELECT OPTION 2, PLEASE LEAVE A MESSAGE INCLUDING: YOUR NAME DATE OF BIRTH CALL BACK NUMBER REASON FOR CALL**this is important as we prioritize the call backs  YOU WILL RECEIVE A CALL BACK THE SAME DAY AS LONG AS YOU CALL BEFORE 4:00 PM  At the [facility], you and your health needs are our priority. As part of our continuing mission to provide you with exceptional heart care, we have created designated Provider Care Teams. These Care Teams include your primary Cardiologist (physician) and Advanced Practice Providers (APPs- Physician Assistants and Nurse Practitioners) who all work together to provide you with the care you need, when you need it.   You may see any of the following providers on your designated Care Team at your next follow up: Dr Arvilla Meres Dr Marca Ancona Dr Brandon Melnick, NP Robbie Lis, [place] Mikki Santee Karle Plumber, PharmD   Please be sure to bring in all your medications bottles to every appointment.

## 2020-11-23 NOTE — Progress Notes (Signed)
Office Visit Note  Patient: Michele Bryan             Date of Birth: 1941/01/09           MRN: [id number]             PCP: Sigmund Hazel, MD Referring: Sigmund Hazel, MD Visit Date: 12/06/2020 Occupation: @GUAROCC @  Subjective:  Discuss   History of Present Illness: Michele SCHLAUCH is a 80 y.o. female with history of seropositive rheumatoid arthritis and osteoarthritis.  Patient is currently taking Plaquenil 200 mg 1 tablet by mouth daily.  She updated her Plaquenil eye examination today.  After her last office visit on 10/11/2020 she was started on Rinvoq.  She could not tolerate rinvoq due to developing diarrhea so she discontinued after taking it for about 3 days.  The diarrhea resolved after discontinuing removed.  She did not notice any clinical benefit on Rinvoq since she is only table to take it for several days.  She has been taking Tylenol on a daily basis for pain relief and ibuprofen for breakthrough pain.  She has not taken prednisone recently.  Patient reports that yesterday she was rising from a seated position and pushing off with her arms to get out of the chair and heard a pop in her left shoulder.  The left shoulder has been replaced and has had 2 revisions performed by Dr. 12/11/2020.  She was evaluated by Dr. Devonne Doughty yesterday who advised her to wear a brace on a daily basis.  According to the patient he will be discussing her case with other orthopedic surgeons to come up with a plan going forward.   She presents today to discuss other treatment options.     Activities of Daily Living:  Patient reports morning stiffness for several hours.   Patient Reports nocturnal pain.  Difficulty dressing/grooming: Denies Difficulty climbing stairs: Reports Difficulty getting out of chair: Reports Difficulty using hands for taps, buttons, cutlery, and/or writing: Reports  Review of Systems  Constitutional:  Positive for fatigue.  HENT:  Negative for mouth sores, mouth dryness and  nose dryness.   Eyes:  Negative for pain, itching and dryness.  Respiratory:  Negative for shortness of breath and difficulty breathing.   Cardiovascular:  Negative for chest pain and palpitations.  Gastrointestinal:  Negative for blood in stool, constipation and diarrhea.  Endocrine: Negative for increased urination.  Genitourinary:  Positive for difficulty urinating. Negative for painful urination.  Musculoskeletal:  Positive for joint pain, joint pain, joint swelling, myalgias, morning stiffness, muscle tenderness and myalgias.  Skin:  Negative for color change, rash and redness.  Allergic/Immunologic: Negative for susceptible to infections.  Neurological:  Negative for dizziness, numbness, headaches, memory loss and weakness.  Hematological:  Positive for bruising/bleeding tendency.  Psychiatric/Behavioral:  Negative for confusion.    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  05/06/2016   High risk medication use 05/06/2016   Primary  osteoarthritis of both hands 05/06/2016   Primary osteoarthritis of left knee 05/06/2016   Primary osteoarthritis of both feet 05/06/2016   Dyslipidemia 05/06/2016   Peripheral neuropathy 05/06/2016   Diverticulosis of intestine without bleeding 05/06/2016   Osteopenia  05/06/2016   Vitamin D deficiency 05/06/2016   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   History of GI bleed 05/06/2016   Cataract of both  eyes 05/06/2016   Urinary urgency 04/30/2015   Chronic constipation 04/30/2015   MS (multiple sclerosis) (HCC) 03/23/2013   Expected blood loss anemia 06/16/2012   S/P right TKA 06/15/2012    Past Medical History:  Diagnosis Date   Anemia    Arthritis    GERD (gastroesophageal reflux disease)    History of blood transfusion    Hyperlipidemia    Hypertension    MS (multiple sclerosis) (HCC)    Neuromuscular disorder (HCC)    multiple scleroosis/peripheral neuropathy   PONV (postoperative nausea and vomiting)    Rheumatoid arthritis (HCC)    Ulcer    Vitamin D deficiency     Family History  Problem Relation Age of Onset   Heart attack Father    Breast cancer Neg Hx    Past Surgical History:  Procedure Laterality Date   APPLICATION OF INTRAOPERATIVE CT SCAN N/A 02/16/2020   Procedure: APPLICATION OF INTRAOPERATIVE CT SCAN;  Surgeon: Jadene Pierini, MD;  Location: [facility] OR;  Service: Neurosurgery;  Laterality: N/A;   AUGMENTATION MAMMAPLASTY     BACK SURGERY     breast augumentation     BREAST SURGERY     biopsy   COLONOSCOPY     DILATION AND CURETTAGE OF UTERUS     EYE SURGERY     both eys,cataracts   goiter     HALO APPLICATION N/A 02/16/2020   Procedure: HALO TRACTION APPLICATION;  Surgeon: Jadene Pierini, MD;  Location: [facility] OR;  Service: Neurosurgery;  Laterality: N/A;   JOINT REPLACEMENT     KNEE ARTHROSCOPY Right    ORIF SHOULDER FRACTURE Left 03/05/2018   Procedure: OPEN REDUCTION INTERNAL FIXATION (ORIF) LEFT SCAPULA;  Surgeon: Beverely Low, MD;  Location: [facility] OR;  Service: Orthopedics;  Laterality: Left;   POSTERIOR CERVICAL FUSION/FORAMINOTOMY N/A 12/06/2019   Procedure: Cervical one-two Posterior instrumented fusion;  Surgeon: Jadene Pierini, MD;  Location: [facility] OR;  Service: Neurosurgery;  Laterality: N/A;   POSTERIOR CERVICAL FUSION/FORAMINOTOMY N/A 02/13/2020   Procedure: Revision of cervical instrumented fusion with Occiput to Cervical Four  posterior instrumented fusion;  Surgeon: Jadene Pierini, MD;  Location: [facility] OR;  Service: Neurosurgery;  Laterality: N/A;  posterior   POSTERIOR CERVICAL FUSION/FORAMINOTOMY N/A 02/16/2020   Procedure: Occiput to Thoracic Two Posterior Cervical Fusion with AIRO and Application of Halo;  Surgeon: Jadene Pierini, MD;  Location: [facility] OR;  Service: Neurosurgery;  Laterality: N/A;   REVERSE SHOULDER ARTHROPLASTY Left 05/15/2017   Procedure: LEFT REVERSE SHOULDER ARTHROPLASTY;  Surgeon: Beverely Low, MD;  Location: [facility] OR;  Service: Orthopedics;  Laterality: Left;   REVERSE SHOULDER ARTHROPLASTY Left 10/11/2018   Procedure: left shoulder irrigation and debridement, open poly exchange and removal of painful hardware;  Surgeon: Beverely Low, MD;  Location: [facility] ORS;  Service: Orthopedics;  Laterality: Left;   THYROID LOBECTOMY     TOTAL KNEE ARTHROPLASTY Right 06/15/2012   Procedure: RIGHT TOTAL KNEE ARTHROPLASTY;  Surgeon: [name], MD;  Location: [facility] ORS;  Service: Orthopedics;  Laterality: Right;   WRIST SURGERY Left  Social History   Social History Narrative   Patient lives at home with her husband [name]).   Retired.   Education- college.   Caffeine- Two cups of decaf tea daily.   Right handed.         Immunization History  Administered Date(s) Administered   Influenza, High Dose Seasonal PF 12/03/2013   Influenza,inj,quad, With Preservative 11/23/2014   Influenza-Unspecified 12/19/2017   PFIZER(Purple Top)SARS-COV-2 Vaccination 04/16/2019, 05/29/2019, 01/09/2020   Td 12/15/2017   Zoster Recombinat (Shingrix) 08/10/2017, 01/05/2018     Objective: Vital Signs: BP (!) 148/84 (BP Location: Right Arm, Patient Position: Sitting, Cuff Size: Normal)   Pulse 75   Ht 5\' 3"  (1.6 m)   Wt 117 lb 9.6 oz (53.3 kg)   BMI 20.83 kg/m    Physical Exam Vitals and nursing note reviewed.  Constitutional:      Appearance: She is well-developed.  HENT:     Head: Normocephalic and  atraumatic.  Eyes:     Conjunctiva/sclera: Conjunctivae normal.  Pulmonary:     Effort: Pulmonary effort is normal.  Abdominal:     Palpations: Abdomen is soft.  Musculoskeletal:     Cervical back: Normal range of motion.  Skin:    General: Skin is warm and dry.     Capillary Refill: Capillary refill takes less than 2 seconds.  Neurological:     Mental Status: She is alert and oriented to person, place, and time.  Psychiatric:        Behavior: Behavior normal.     Musculoskeletal Exam: C-spine has extremely limited range of motion.  Right shoulder has good range of motion with some discomfort.  Right shoulder in a brace.  Right elbow and wrist joint have good range of motion.  Synovial thickening over the left wrist.  Tenderness and synovitis over several PIP joints.  Tenderness and synovitis of the right first, second, and fifth MCP joints.  PIP and DIP thickening consistent with osteoarthritis of both hands noted.  Incomplete fist formation bilaterally.  Hip joints difficult to assess well in seated position.  Right knee replacement has good range of motion with warmth.  Left knee joint has good range of motion with warmth.  Ankle joints have good range of motion with no tenderness.  Pedal edema noted bilaterally.  CDAI Exam: CDAI Score: 19.8  Patient Global: 4 mm; Provider Global: 4 mm Swollen: 9 ; Tender: 10  Joint Exam 12/06/2020      Right  Left  Glenohumeral      Tender  MCP 1  Swollen Tender     MCP 2  Swollen Tender     MCP 5  Swollen Tender     PIP 2  Swollen Tender  Swollen Tender  PIP 3  Swollen Tender  Swollen Tender  PIP 4  Swollen Tender     PIP 5  Swollen Tender        Investigation: No additional findings.  Imaging: No results found.  Recent Labs: Lab Results  Component Value Date   WBC 5.6 10/29/2020   HGB 10.3 (L) 10/29/2020   PLT 248 10/29/2020   NA 135 10/29/2020   K 5.0 10/29/2020   CL 101 10/29/2020   CO2 28 10/29/2020   GLUCOSE 78  10/29/2020   BUN 13 10/29/2020   CREATININE 0.78 10/29/2020   BILITOT 0.8 10/29/2020   ALKPHOS 124 08/17/2020   AST 19 10/29/2020   ALT 12 10/29/2020   PROT 6.3 10/29/2020   ALBUMIN  3.7 08/17/2020   CALCIUM 9.6 10/29/2020   GFRAA 98 04/03/2020   QFTBGOLDPLUS NEGATIVE 01/03/2020    Speciality Comments: PLQ Eye Exam:12/06/2020 WNL@ [facility] Osteoporosis managed by PCP-ACY 10/12/2018  Procedures:  No procedures performed Allergies: Meperidine hcl, Codeine, and Demerol [meperidine]     Assessment / Plan:     Visit Diagnoses: 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: She has ongoing pain and inflammation in both hands secondary to active rheumatoid arthritis.  She has incomplete fist formation on examination today with ongoing synovitis.  She has been taking Tylenol on a daily basis for pain relief and occasionally takes ibuprofen for breakthrough symptoms.  She is currently taking Plaquenil 200 mg 1 tablet by mouth daily.  After her last office visit on 10/11/2020 the plan was to start on Rinvoq as combination therapy.  She discontinued Rinvoq after taking it for 3 days due to experiencing significant diarrhea.  Her diarrhea has resolved since discontinuing Rinvoq.  She presented today to discuss other treatment options.  [name] had suggested trying low-dose Xeljanz 5 mg 1 tablet daily to see if there is better tolerability.  Indications, contraindications, potential side effects of Harriette Ohara were discussed today in detail.  All questions were addressed and consent was obtained.  A sample of Harriette Ohara was provided to the patient today which she will start pending lab work today.  We will apply for Harriette Ohara to her insurance and once approved a prescription will be sent to the pharmacy.  She was advised to notify us if she cannot tolerate taking Harriette Ohara.  If she tolerates low-dose Harriette Ohara we can consider increasing the dose of Xeljanz to  twice daily in the future.  She will follow-up in the office in 6 weeks to assess her response.  Medication counseling: Baseline Immunosuppressant Therapy Labs TB GOLD Quantiferon TB Gold Latest Ref Rng & Units 01/03/2020  Quantiferon TB Gold Plus NEGATIVE NEGATIVE   Hepatitis Panel   HIV Lab Results  Component Value Date   HIV NON-REACTIVE 03/19/2017   Immunoglobulins Immunoglobulin Electrophoresis Latest Ref Rng & Units 03/19/2017  IgA  81 - 463 mg/dL 527  IgG 782 - 4,235 mg/dL 361  IgM 48 - 443 mg/dL 67   SPEP Serum Protein Electrophoresis Latest Ref Rng & Units 10/29/2020  Total Protein 6.1 - 8.1 g/dL 6.3  Albumin 3.8 - 4.8 g/dL -  Alpha-1 0.2 - 0.3 g/dL -  Alpha-2 0.5 - 0.9 g/dL -  Beta Globulin 0.4 - 0.6 g/dL -  Beta 2 0.2 - 0.5 g/dL -  Gamma Globulin 0.8 - 1.7 g/dL -     Chest x-ray: No active cardiopulmonary disease 03/19/2017  Does patient have history of diverticulitis?  No Hx of diverticulosis-she has been cleared by GI to try jak inhibitors.   Counseled patient that Harriette Ohara is a JAK inhibitor.  Counseled patient on purpose, proper use, and adverse effects of Xeljanz.  Reviewed the most common adverse effects including infection, diarrhea, headaches. Counseled on the increased risk of DVT/PE.  Also reviewed rare adverse effects such as bowel injury and the need to contact us if develops stomach pain during treatment.  Reviewed with patient that there is the possibility of an increased risk of malignancy but it is not well understood if this increased risk is due to the medication or the disease state.  Counseled that Harriette Ohara should be held for infection and prior to surgery.  Counseled patient to avoid live vaccines while on [place].  Recommend annual influenza, Pneumovax 23, Prevnar 13, and Shingrix as indicated.   Reviewed importance of routine lab monitoring including a lipid panel.  Standing orders placed and patient is to return in 1 month and then every 3 months  after initiation. Provided patient with medication education material and answered all questions.  Patient consented to [place].  Will upload Harriette Ohara into patient's chart.  Will apply through patient's insurance and update when we receive a response.  Patient dose will be 5 mg daily.  Prescription will be sent to pharmacy pending lab results and/or insurance approval.  High risk medication use -Xeljanz 5 mg 1 tablet by mouth daily and Plaquenil 200 mg 1 tablet by mouth once daily.  Discontinued Rinvoq due to diarrhea.  She has not a good candidate for TNF inhibitors due to history of multiple sclerosis.  If she cannot tolerate taking Harriette Ohara we will likely have to discuss proceeding with Actemra in the future.   TB gold negative on 01/03/20.  Future order for TB gold placed today.  CBC and CMP updated on 10/29/20.  CBC and CMP will be drawn today prior to initiating Xeljanz.  She will return for lab work 1 month and every 3 months after starting on Xeljanz.  Standing orders remain in place.  Future order for lipid panel is in place.  She is not currently fasting. - Plan: QuantiFERON-TB Gold Plus, CBC with Differential/Platelet, COMPLETE METABOLIC PANEL WITH GFR She has not had any recent infections.  We discussed the importance of holding Harriette Ohara if she develops signs or symptoms of an infection and to resume once the infection has completely cleared. She has had the shingrix vaccine.  Discussed the black box warnings associated with Jak inhibitors including increased risk for malignancy and MACE.  Discussed the importance of modifying risk factors.  Future order for lipid panel remains in place.   Screening for tuberculosis -Future order for TB gold placed today.  Plan: QuantiFERON-TB Gold Plus  Primary osteoarthritis of both hands: She has severe PIP and DIP thickening consistent with osteoarthritis of both hands.  Incomplete fist formation bilaterally.  H/O total shoulder replacement, left -Revision  x2 performed by Dr. Devonne Doughty.  Yesterday she was rising from a seated position and pushed off from a chair with both arms and heard a pop in her left shoulder.  She was evaluated by Dr. Devonne Doughty yesterday and the patient reports there has been damage to the left shoulder replacement.  She is currently wearing a brace.  According to the patient Dr. Devonne Doughty will be reaching out to another orthopedist to come up with a plan moving forward.  Status post right knee replacement: Doing well.  She has good range of motion with no discomfort at this time.  Warmth but no effusion noted.  Primary osteoarthritis of left knee: She has good range of motion of the left knee joint with crepitus.  Warmth but no effusion noted.  Primary osteoarthritis of both feet: She is not experiencing any increased discomfort in her feet at this time.  She has good range of motion of both ankle joints with no tenderness on examination today.  Pedal edema was noted bilaterally.  Closed fracture of cervical vertebra, unspecified cervical vertebral level, sequela - Patient had a fall in January 2020 at which time she fractured C1 and C2 with displacement requiring fusion on 12/06/2019.  She has extremely limited range of motion of the C-spine.  Osteopenia of multiple sites - She remains on forteo daily  injections. DEXA on 02/22/18: The BMD measured at [facility] is 0.742 g/cm2 with a T-score of -2.1.  Forteo was a started in February 2022.  She is overdue to update her bone density.  A future order remains in place from Dr. Hyacinth Meeker in February 2022.  History of vitamin D deficiency: She is taking vitamin D 5000 units daily.  Postural kyphosis of thoracic region: Unchanged.  Other medical conditions are listed as follows:  History of multiple sclerosis (HCC) - She has not a good candidate for TNF inhibitors  History of GI bleed - Cleared by gastroenterologist to proceed with Jak inhibitors.  Dyslipidemia: Patient is not fasting  this morning.  Future order for lipid panel remains in place.  History of diverticulosis - Cleared by her gastroenterologist on 10/09/2020 to proceed with Jak inhibitors.  History of cataract  History of peripheral neuropathy    Orders: Orders Placed This Encounter  Procedures   QuantiFERON-TB Gold Plus   CBC with Differential/Platelet   COMPLETE METABOLIC PANEL WITH GFR   No orders of the defined types were placed in this encounter.     Follow-Up Instructions: Return in about 3 months (around 03/08/2021) for Rheumatoid arthritis, Osteoarthritis.   Gearldine Bienenstock, PA-C  Note - This record has been created using Dragon software.  Chart creation errors have been sought, but may not always  have been located. Such creation errors do not reflect on  the standard of medical care.

## 2020-12-01 ENCOUNTER — Other Ambulatory Visit: Payer: Self-pay | Admitting: Rheumatology

## 2020-12-01 DIAGNOSIS — M0579 Rheumatoid arthritis with rheumatoid factor of multiple sites without organ or systems involvement: Secondary | ICD-10-CM

## 2020-12-03 NOTE — Telephone Encounter (Signed)
Next Visit: 12/06/2020  Last Visit: 10/11/2020  Labs: 10/29/2020 CMP WNL.  Anemia stable. We will continue to monitor.   Eye exam: PLQ Eye Exam: 07/30/17 WNL    Current Dose per office note 10/11/2020: Plaquenil 200 mg 1 tablet by mouth once daily.   DX: Rheumatoid arthritis involving multiple sites with positive rheumatoid factor   Last Fill: 09/05/2020   Spoke with patient's husband [name] and advised patient needs an update PLQ eye exam. He states they will get it scheduled and call with appointment time.   Okay to refill Plaquenil?

## 2020-12-04 ENCOUNTER — Other Ambulatory Visit (HOSPITAL_COMMUNITY): Payer: Self-pay

## 2020-12-05 DIAGNOSIS — M25512 Pain in left shoulder: Secondary | ICD-10-CM | POA: Diagnosis not present

## 2020-12-05 DIAGNOSIS — Z96612 Presence of left artificial shoulder joint: Secondary | ICD-10-CM | POA: Diagnosis not present

## 2020-12-06 ENCOUNTER — Other Ambulatory Visit (HOSPITAL_COMMUNITY): Payer: Self-pay

## 2020-12-06 ENCOUNTER — Other Ambulatory Visit: Payer: Self-pay

## 2020-12-06 ENCOUNTER — Telehealth: Payer: Self-pay | Admitting: Pharmacist

## 2020-12-06 ENCOUNTER — Ambulatory Visit: Payer: Medicare PPO | Admitting: Physician Assistant

## 2020-12-06 ENCOUNTER — Encounter: Payer: Self-pay | Admitting: Physician Assistant

## 2020-12-06 VITALS — BP 148/84 | HR 75 | Ht 63.0 in | Wt 117.6 lb

## 2020-12-06 DIAGNOSIS — Z96651 Presence of right artificial knee joint: Secondary | ICD-10-CM

## 2020-12-06 DIAGNOSIS — Z8639 Personal history of other endocrine, nutritional and metabolic disease: Secondary | ICD-10-CM

## 2020-12-06 DIAGNOSIS — M1712 Unilateral primary osteoarthritis, left knee: Secondary | ICD-10-CM

## 2020-12-06 DIAGNOSIS — M8589 Other specified disorders of bone density and structure, multiple sites: Secondary | ICD-10-CM

## 2020-12-06 DIAGNOSIS — M19041 Primary osteoarthritis, right hand: Secondary | ICD-10-CM

## 2020-12-06 DIAGNOSIS — Z96612 Presence of left artificial shoulder joint: Secondary | ICD-10-CM | POA: Diagnosis not present

## 2020-12-06 DIAGNOSIS — G35 Multiple sclerosis: Secondary | ICD-10-CM

## 2020-12-06 DIAGNOSIS — E785 Hyperlipidemia, unspecified: Secondary | ICD-10-CM

## 2020-12-06 DIAGNOSIS — S129XXS Fracture of neck, unspecified, sequela: Secondary | ICD-10-CM | POA: Diagnosis not present

## 2020-12-06 DIAGNOSIS — Z79899 Other long term (current) drug therapy: Secondary | ICD-10-CM | POA: Diagnosis not present

## 2020-12-06 DIAGNOSIS — Z8719 Personal history of other diseases of the digestive system: Secondary | ICD-10-CM

## 2020-12-06 DIAGNOSIS — H524 Presbyopia: Secondary | ICD-10-CM | POA: Diagnosis not present

## 2020-12-06 DIAGNOSIS — M0579 Rheumatoid arthritis with rheumatoid factor of multiple sites without organ or systems involvement: Secondary | ICD-10-CM

## 2020-12-06 DIAGNOSIS — M19072 Primary osteoarthritis, left ankle and foot: Secondary | ICD-10-CM

## 2020-12-06 DIAGNOSIS — M19071 Primary osteoarthritis, right ankle and foot: Secondary | ICD-10-CM | POA: Diagnosis not present

## 2020-12-06 DIAGNOSIS — Z111 Encounter for screening for respiratory tuberculosis: Secondary | ICD-10-CM

## 2020-12-06 DIAGNOSIS — Z961 Presence of intraocular lens: Secondary | ICD-10-CM | POA: Diagnosis not present

## 2020-12-06 DIAGNOSIS — Z8669 Personal history of other diseases of the nervous system and sense organs: Secondary | ICD-10-CM

## 2020-12-06 DIAGNOSIS — M19042 Primary osteoarthritis, left hand: Secondary | ICD-10-CM

## 2020-12-06 DIAGNOSIS — M4004 Postural kyphosis, thoracic region: Secondary | ICD-10-CM

## 2020-12-06 DIAGNOSIS — G35D Multiple sclerosis, unspecified: Secondary | ICD-10-CM

## 2020-12-06 DIAGNOSIS — M069 Rheumatoid arthritis, unspecified: Secondary | ICD-10-CM | POA: Diagnosis not present

## 2020-12-06 NOTE — Telephone Encounter (Signed)
Please start Xeljanz BIV.  Dose: 5mg  once daily (LOW-dose d/t diarrhea with Rinvoq)  Dx: Rheumatoid arthritis (M05.9)  Previously tried therapies: Orencia - 2019 through July 2022 Rinvoq x 9 days Hydroxychloroquine - inadequate clinical response  Therapies patient unable to try: TNF inhibitors - personal history of MS Actemra/Kevzara - personal history of MS  She was provided with 60 day supply of Xeljanz 5mg  tablets today.  Patient is amenable to paying $100 per month for copay (historically this has been her copay with her meds)  August 2022, PharmD, MPH, BCPS Clinical Pharmacist (Rheumatology and Pulmonology)

## 2020-12-06 NOTE — Telephone Encounter (Signed)
Submitted a Prior Authorization request to [facility] for [facility] via CoverMyMeds. Will update once we receive a response.   Key: Zella Ball

## 2020-12-06 NOTE — Telephone Encounter (Signed)
Received notification from [facility] regarding a prior authorization for [facility]. Authorization has been APPROVED from 03/03/20 to 03/02/22.   Per test claim, copay for 30 days supply is $100.00  Patient can fill through [facility]: [phone]   Authorization # [id number]

## 2020-12-06 NOTE — Progress Notes (Signed)
Pharmacy Note  Subjective: Patient presents today to [facility] for follow up office visit.  Patient seen by the pharmacist for counseling on Xeljanz for rheumatoid arthritis.  Prior therapy includes:Rinvoq (most recently, stopped d/t diarrhea. Diarrhea resolved after 2 days of d/c). Prior to Rinvoq, she was taking Orencia since September 2019 and had waning clinical response.  History of diverticulitis?  No. Was cleared by GI to start Rinvoq given history of diverticulosis  History of MI, stroke, or CV events:  No.  Her father died from MI when he was 21 y/o but no personal or family history of stroke, MI, DVT, PE  Objective:  CMP     Component Value Date/Time   NA 135 10/29/2020 1048   K 5.0 10/29/2020 1048   CL 101 10/29/2020 1048   CO2 28 10/29/2020 1048   GLUCOSE 78 10/29/2020 1048   BUN 13 10/29/2020 1048   CREATININE 0.78 10/29/2020 1048   CALCIUM 9.6 10/29/2020 1048   PROT 6.3 10/29/2020 1048   ALBUMIN 3.7 08/17/2020 1312   AST 19 10/29/2020 1048   ALT 12 10/29/2020 1048   ALKPHOS 124 08/17/2020 1312   BILITOT 0.8 10/29/2020 1048   GFRNONAA >60 08/17/2020 1312   GFRNONAA 85 04/03/2020 1030   GFRAA 98 04/03/2020 1030    CBC    Component Value Date/Time   WBC 5.6 10/29/2020 1048   RBC 3.58 (L) 10/29/2020 1048   HGB 10.3 (L) 10/29/2020 1048   HCT 32.6 (L) 10/29/2020 1048   PLT 248 10/29/2020 1048   MCV 91.1 10/29/2020 1048   MCH 28.8 10/29/2020 1048   MCHC 31.6 (L) 10/29/2020 1048   RDW 12.5 10/29/2020 1048   LYMPHSABS 1,949 10/29/2020 1048   MONOABS 0.7 02/29/2020 0542   EOSABS 118 10/29/2020 1048   BASOSABS 50 10/29/2020 1048    Baseline Immunosuppressant Therapy Labs TB GOLD Quantiferon TB Gold Latest Ref Rng & Units 01/03/2020  Quantiferon TB Gold Plus NEGATIVE NEGATIVE   Hepatitis Panel   HIV Lab Results  Component Value Date   HIV NON-REACTIVE 03/19/2017   Immunoglobulins Immunoglobulin Electrophoresis Latest Ref Rng & Units  03/19/2017  IgA  81 - 463 mg/dL 102  IgG 585 - 2,778 mg/dL 242  IgM 48 - 353 mg/dL 67   SPEP Serum Protein Electrophoresis Latest Ref Rng & Units 10/29/2020  Total Protein 6.1 - 8.1 g/dL 6.3  Albumin 3.8 - 4.8 g/dL -  Alpha-1 0.2 - 0.3 g/dL -  Alpha-2 0.5 - 0.9 g/dL -  Beta Globulin 0.4 - 0.6 g/dL -  Beta 2 0.2 - 0.5 g/dL -  Gamma Globulin 0.8 - 1.7 g/dL -   Lipid panel No results found for: CHOL, HDL, LDLCALC, LDLDIRECT, TRIG, CHOLHDL   Assessment/Plan: Patient received counseling for Xeljanz. 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  Counseled patient on purpose, proper use, and adverse effects of Xeljanz. Reviewed the most common adverse effects including infection, diarrhea, headaches. Also reviewed rare adverse effects such as bowel injury and the need to contact us if they develop stomach pain during treatment.  Counseled on the increased risk of DVT/PE.  Counseled about FDA black box warning of MACE (major adverse CV events including cardiovascular death, myocardial infarction, and stroke).  Reviewed with patient that there is the possibility of an increased risk of malignancy specifically lung cancer and lymphomas but it is not well understood if this increased risk is due to the medication or the disease state. Instructed patient that medication  should be held for infection and prior to surgery.  Advised patient to avoid live vaccines. Recommend annual influenza, PCV 15 or PCV20 or Pneumovax 23, and Shingrix as indicated.  Counseled that Harriette Ohara should be held for infection and prior to surgery.  Reviewed importance of routine lab monitoring including a lipid panel.  Will recheck lipid panel 3 months after starting and annually thereafter. CBC and CMP will be monitored routinely every 3 months. Standing orders placed and patient is to return in 1 month and then every 3 months after initiation.   Provided patient with medication education material and answered all questions.  Patient  consented to [place]. Will upload Harriette Ohara into patient's chart. Future order for lipid panel is in place and she will plan to be fasting for next labwork  Will apply through patient's insurance and update when we receive a response  Her dose will be Xeljanz 5mg  once daily. She is amenable to $100 per 30 day supply copay  Patient provided with sample today for 60 day supply and will start once labs from today have resulted.  Will be able to assess tolerance using sample.  Medication Samples have been provided to the patient.  Drug name: 5mg  Qty: 60 tabs NDC: Harriette Ohara LOT: Exp.Date: 08/2022  Dosing instructions: Take one tablet by mouth once daily  The patient has been instructed regarding the correct time, dose, and frequency of taking this medication, including desired effects and most common side effects.   [id number], PharmD, MPH, BCPS Clinical Pharmacist (Rheumatology and Pulmonology)

## 2020-12-07 ENCOUNTER — Other Ambulatory Visit: Payer: Self-pay | Admitting: Orthopedic Surgery

## 2020-12-07 ENCOUNTER — Other Ambulatory Visit: Payer: Self-pay

## 2020-12-07 ENCOUNTER — Encounter (HOSPITAL_COMMUNITY): Payer: Self-pay | Admitting: Orthopedic Surgery

## 2020-12-07 LAB — COMPLETE METABOLIC PANEL WITH GFR
AG Ratio: 2 (calc) (ref 1.0–2.5)
ALT: 11 U/L (ref 6–29)
AST: 16 U/L (ref 10–35)
Albumin: 4.1 g/dL (ref 3.6–5.1)
Alkaline phosphatase (APISO): 98 U/L (ref 37–153)
BUN: 18 mg/dL (ref 7–25)
CO2: 24 mmol/L (ref 20–32)
Calcium: 9.8 mg/dL (ref 8.6–10.4)
Chloride: 108 mmol/L (ref 98–110)
Creat: 0.63 mg/dL (ref 0.60–1.00)
Globulin: 2.1 g/dL (calc) (ref 1.9–3.7)
Glucose, Bld: 108 mg/dL — ABNORMAL HIGH (ref 65–99)
Potassium: 4.1 mmol/L (ref 3.5–5.3)
Sodium: 139 mmol/L (ref 135–146)
Total Bilirubin: 0.7 mg/dL (ref 0.2–1.2)
Total Protein: 6.2 g/dL (ref 6.1–8.1)
eGFR: 91 mL/min/{1.73_m2} (ref >=60)

## 2020-12-07 LAB — CBC WITH DIFFERENTIAL/PLATELET
Absolute Monocytes: 700 cells/uL (ref 200–950)
Basophils Absolute: 33 cells/uL (ref 0–200)
Basophils Relative: 0.5 %
Eosinophils Absolute: 0 cells/uL — ABNORMAL LOW (ref 15–500)
Eosinophils Relative: 0 %
HCT: 33.1 % — ABNORMAL LOW (ref 35.0–45.0)
Hemoglobin: 10.5 g/dL — ABNORMAL LOW (ref 11.7–15.5)
Lymphs Abs: 1406 cells/uL (ref 850–3900)
MCH: 28.2 pg (ref 27.0–33.0)
MCHC: 31.7 g/dL — ABNORMAL LOW (ref 32.0–36.0)
MCV: 89 fL (ref 80.0–100.0)
MPV: 10.1 fL (ref 7.5–12.5)
Monocytes Relative: 10.6 %
Neutro Abs: 4462 cells/uL (ref 1500–7800)
Neutrophils Relative %: 67.6 %
Platelets: 184 10*3/uL (ref 140–400)
RBC: 3.72 10*6/uL — ABNORMAL LOW (ref 3.80–5.10)
RDW: 12.6 % (ref 11.0–15.0)
Total Lymphocyte: 21.3 %
WBC: 6.6 10*3/uL (ref 3.8–10.8)

## 2020-12-07 LAB — SARS CORONAVIRUS 2 (TAT 6-24 HRS): SARS Coronavirus 2: NEGATIVE

## 2020-12-07 NOTE — Anesthesia Preprocedure Evaluation (Addendum)
Anesthesia Evaluation  Patient identified by MRN, date of birth, ID band Patient awake    Reviewed: Allergy & Precautions, NPO status , Patient's Chart, lab work & pertinent test results, reviewed documented beta blocker date and time   History of Anesthesia Complications (+) PONV and history of anesthetic complications  Airway Mallampati: III  TM Distance: >3 FB Neck ROM: Limited    Dental no notable dental hx. (+) Teeth Intact, Dental Advisory Given   Pulmonary neg pulmonary ROS,    Pulmonary exam normal breath sounds clear to auscultation       Cardiovascular hypertension, Pt. on medications Normal cardiovascular exam Rhythm:Regular Rate:Normal     Neuro/Psych Peripheral neuropathy  MS  Neuromuscular disease negative psych ROS   GI/Hepatic Neg liver ROS, GERD  Medicated,  Endo/Other  Hyperlipidemia Osteoporosis  Renal/GU negative Renal ROS  negative genitourinary   Musculoskeletal  (+) Arthritis , Rheumatoid disorders,  Hx/o C1-C2 Fx in 2021 S/P ORIF  DJD Left shoulder   Abdominal   Peds  Hematology  (+) anemia ,   Anesthesia Other Findings   Reproductive/Obstetrics                           Anesthesia Physical Anesthesia Plan  ASA: 3  Anesthesia Plan: General   Post-op Pain Management:  Regional for Post-op pain   Induction: Intravenous  PONV Risk Score and Plan: 4 or greater and Treatment may vary due to age or medical condition, Ondansetron and Dexamethasone  Airway Management Planned: Oral ETT and Video Laryngoscope Planned  Additional Equipment:   Intra-op Plan:   Post-operative Plan: Extubation in OR  Informed Consent: I have reviewed the patients History and Physical, chart, labs and discussed the procedure including the risks, benefits and alternatives for the proposed anesthesia with the patient or authorized representative who has indicated his/her  understanding and acceptance.     Dental advisory given  Plan Discussed with: Anesthesiologist and CRNA  Anesthesia Plan Comments: (See PAT note 12/07/2020, Jodell Cipro Ward, PA-C)       Anesthesia Quick Evaluation

## 2020-12-07 NOTE — Progress Notes (Signed)
Please enter orders for surgery scheduled for 12-11-20.

## 2020-12-07 NOTE — Progress Notes (Addendum)
COVID swab appointment: 12-07-20  COVID Vaccine Completed:  Yes x3 Date COVID Vaccine completed:  04-16-19, 05-29-19 Has received booster:  Yes x1 01-09-20 COVID vaccine manufacturer: Pfizer    Moderna   [employer]   Date of COVID positive in last 90 days: No  PCP - Sigmund Hazel, MD.  Requested office note Cardiologist - Arvilla Meres, MD  Chest x-ray - N/A EKG - 08-17-20 Epic Stress Test - N/A ECHO - 08-17-20 Epic Cardiac Cath - N/A Pacemaker/ICD device last checked: Spinal Cord Stimulator:  Sleep Study - N/A CPAP -   Fasting Blood Sugar - N/A Checks Blood Sugar _____ times a day  Blood Thinner Instructions: N/A Aspirin Instructions: Last Dose:  Activity level:  Can go up a flight of stairs and perform activities of daily living without stopping and without symptoms of chest pain or shortness of breath.    Anesthesia review:  MS, HTN.    Poor R wave progression on EKG 08-17-20.    Followed by Dr. Gala Romney for possible CHF (states has not given definite diagnosis).  Pt states that she continues to have swelling of the ankles, wearing compression hose with some improvement.  Patient denies shortness of breath, fever, cough and chest pain at PAT appointment (completed over the phone)   Patient verbalized understanding of instructions that were given to them at the PAT appointment. Patient was also instructed that they will need to review over the PAT instructions again at home before surgery.

## 2020-12-07 NOTE — Progress Notes (Signed)
Anesthesia Chart Review   Case: [id number] Date/Time: 12/11/20 1645   Procedure: Left reverse shoulder conversion to SHOULDER HEMI-ARTHROPLASTY (Left: Shoulder) - with ISB   Anesthesia type: General   Pre-op diagnosis: reverse total shoulder dislocation   Location: [facility] 10 / [facility]   Surgeons: Beverely Low, MD       DISCUSSION:80 y.o. never smoker with h/o PONV, GERD, HTN, CHF, MS, RA, reverse total shoulder dislocation scheduled for above procedure 12/11/20 with Dr. Beverely Low  H/o C1, C2 fracture 2021 s/p fusion 12/06/2019, underwent revision 02/2020.   Pt last seen by cardiology 11/19/2020 for evaluation of lower extremity edema.  Echo with EF 60-65%.  LE edema and shortness of breath much improved with compression hose and PRN lasix.  Per Dr. 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 Follow in in several months per note.   VS: Ht 5\' 3"  (1.6 m)   Wt 52.2 kg   BMI 20.37 kg/m   PROVIDERS: , MD is PCP   Sigmund Hazel, MD is Cardiologist  LABS: Labs reviewed: Acceptable for surgery. (all labs ordered are listed, but only abnormal results are displayed)  Labs Reviewed - No data to display   IMAGES:   EKG: 08/17/2020 Rate 65 bpm  NSR Poor R wave progression   CV: Echo 08/17/2020   1. Left ventricular ejection fraction, by estimation, is 60 to 65%. The  left ventricle has normal function. The left ventricle has no regional  wall motion abnormalities. Left ventricular diastolic parameters are  consistent with Grade II diastolic  dysfunction (pseudonormalization).   2. Right ventricular systolic function is normal. The right ventricular  size is normal.   3. Left atrial size was mild to moderately dilated.   4. Right atrial size was mild to moderately dilated.   5. The mitral valve is normal in structure. Mild mitral valve  regurgitation. No evidence of mitral stenosis.   6. The aortic valve is normal in structure. There is  moderate  calcification of the aortic valve. Aortic valve regurgitation is not  visualized. Mild to moderate aortic valve sclerosis/calcification is  present, without any evidence of aortic stenosis.   7. The inferior vena cava is normal in size with greater than 50%  respiratory variability, suggesting right atrial pressure of 3 mmHg.  Past Medical History:  Diagnosis Date   Anemia    Arthritis    GERD (gastroesophageal reflux disease)    History of blood transfusion    Hyperlipidemia    Hypertension    Lower extremity edema    MS (multiple sclerosis) (HCC)    Neuromuscular disorder (HCC)    multiple scleroosis/peripheral neuropathy   PONV (postoperative nausea and vomiting)    Rheumatoid arthritis (HCC)    Ulcer    Vitamin D deficiency     Past Surgical History:  Procedure Laterality Date   APPLICATION OF INTRAOPERATIVE CT SCAN N/A 02/16/2020   Procedure: APPLICATION OF INTRAOPERATIVE CT SCAN;  Surgeon: 02/18/2020, MD;  [facility];  Service: Neurosurgery;  Laterality: N/A;   AUGMENTATION MAMMAPLASTY     BACK SURGERY     breast augumentation     BREAST SURGERY     biopsy   COLONOSCOPY     DILATION AND CURETTAGE OF UTERUS     EYE SURGERY     both eys,cataracts   goiter     HALO APPLICATION N/A 02/16/2020   Procedure: HALO TRACTION APPLICATION;  Surgeon: 02/18/2020, MD;  Location: [facility];  Service: Neurosurgery;  Laterality: N/A;   JOINT REPLACEMENT     KNEE ARTHROSCOPY Right    ORIF SHOULDER FRACTURE Left 03/05/2018   Procedure: OPEN REDUCTION INTERNAL FIXATION (ORIF) LEFT SCAPULA;  Surgeon: Beverely Low, MD;  Location[facility] OR;  Service: Orthopedics;  Laterality: Left;   POSTERIOR CERVICAL FUSION/FORAMINOTOMY N/A 12/06/2019   Procedure: Cervical one-two Posterior instrumented fusion;  Surgeon: Jadene Pierini, MD;  [facility];  Service: Neurosurgery;  Laterality: N/A;   POSTERIOR CERVICAL FUSION/FORAMINOTOMY N/A 02/13/2020   Procedure:  Revision of cervical instrumented fusion with Occiput to Cervical Four posterior instrumented fusion;  Surgeon: Jadene Pierini, MD;  [facility];  Service: Neurosurgery;  Laterality: N/A;  posterior   POSTERIOR CERVICAL FUSION/FORAMINOTOMY N/A 02/16/2020   Procedure: Occiput to Thoracic Two Posterior Cervical Fusion with AIRO and Application of Halo;  Surgeon: Jadene Pierini, MD;  Location: [facility] OR;  Service: Neurosurgery;  Laterality: N/A;   REVERSE SHOULDER ARTHROPLASTY Left 05/15/2017   Procedure: LEFT REVERSE SHOULDER ARTHROPLASTY;  Surgeon: Beverely Low, MD;  Location: [facility] OR;  Service: Orthopedics;  Laterality: Left;   REVERSE SHOULDER ARTHROPLASTY Left 10/11/2018   Procedure: left shoulder irrigation and debridement, open poly exchange and removal of painful hardware;  Surgeon: Beverely Low, MD;  [facility];  Service: Orthopedics;  Laterality: Left;   THYROID LOBECTOMY     TOTAL KNEE ARTHROPLASTY Right 06/15/2012   Procedure: RIGHT TOTAL KNEE ARTHROPLASTY;  Surgeon: [name], MD;  [facility];  Service: Orthopedics;  Laterality: Right;   WRIST SURGERY Left     MEDICATIONS: No current facility-administered medications for this encounter.    acetaminophen (TYLENOL) 650 MG CR tablet   alendronate (FOSAMAX) 70 MG tablet   Cyanocobalamin (VITAMIN B-12) 5000 MCG TBDP   docusate sodium (COLACE) 100 MG capsule   furosemide (LASIX) 20 MG tablet   gabapentin (NEURONTIN) 100 MG capsule   hydroxychloroquine (PLAQUENIL) 200 MG tablet   hyoscyamine (LEVBID) 0.375 MG 12 hr tablet   losartan (COZAAR) 50 MG tablet   nystatin cream (MYCOSTATIN)   omeprazole (PRILOSEC) 40 MG capsule   ondansetron (ZOFRAN) 4 MG tablet   polyethylene glycol (MIRALAX / GLYCOLAX) 17 g packet   potassium chloride SA (KLOR-CON) 20 MEQ tablet   rosuvastatin (CRESTOR) 5 MG tablet   Teriparatide, Recombinant, (FORTEO) 600 MCG/2.4ML SOPN   traMADol (ULTRAM) 50 MG tablet   zolpidem (AMBIEN) 10 MG  tablet   amitriptyline (ELAVIL) 50 MG tablet   amLODipine (NORVASC) 5 MG tablet   Cholecalciferol (VITAMIN D) 125 MCG (5000 UT) CAPS   Insulin Pen Needle (PEN NEEDLES) 31G X 5 MM MISC   Insulin Pen Needle 31G X 5 MM MISC    Evolet Salminen Ward, PA-C [facility] [phone]

## 2020-12-07 NOTE — Progress Notes (Signed)
Ok thank you for informing me.

## 2020-12-07 NOTE — Progress Notes (Signed)
Glucose is 108. Rest of CMP WNL.  Anemia has improved slightly.  Ok to start xeljanz 5 mg 1 tablet daily as discussed yesterday.

## 2020-12-10 ENCOUNTER — Other Ambulatory Visit (HOSPITAL_COMMUNITY): Payer: Self-pay

## 2020-12-10 NOTE — H&P (Signed)
Patient's anticipated LOS is less than 2 midnights, meeting these requirements: - Younger than 72 - Lives within 1 hour of care - Has a competent adult at home to recover with post-op recover - NO history of  - Chronic pain requiring opiods  - Diabetes  - Coronary Artery Disease  - Heart failure  - Heart attack  - Stroke  - DVT/VTE  - Cardiac arrhythmia  - Respiratory Failure/COPD  - Renal failure  - Anemia  - Advanced Liver disease     Michele Bryan is an 80 y.o. female.    Chief Complaint: left shoulder pain  HPI: Pt is a 80 y.o. female complaining of left shoulder pain for multiple years. Pain had continually increased since the beginning. X-rays in the clinic show dislocation of reverse total shoulder. Various options are discussed with the patient. Risks, benefits and expectations were discussed with the patient. Patient understand the risks, benefits and expectations and wishes to proceed with surgery.   PCP:  Sigmund Hazel, MD  D/C Plans: Home  PMH: Past Medical History:  Diagnosis Date   Anemia    Arthritis    GERD (gastroesophageal reflux disease)    History of blood transfusion    Hyperlipidemia    Hypertension    Lower extremity edema    MS (multiple sclerosis) (HCC)    Neuromuscular disorder (HCC)    multiple scleroosis/peripheral neuropathy   PONV (postoperative nausea and vomiting)    Rheumatoid arthritis (HCC)    Ulcer    Vitamin D deficiency     PSH: Past Surgical History:  Procedure Laterality Date   APPLICATION OF INTRAOPERATIVE CT SCAN N/A 02/16/2020   Procedure: APPLICATION OF INTRAOPERATIVE CT SCAN;  Surgeon: Jadene Pierini, MD;  [facility];  Service: Neurosurgery;  Laterality: N/A;   AUGMENTATION MAMMAPLASTY     BACK SURGERY     breast augumentation     BREAST SURGERY     biopsy   COLONOSCOPY     DILATION AND CURETTAGE OF UTERUS     EYE SURGERY     both eys,cataracts   goiter     HALO APPLICATION N/A 02/16/2020    Procedure: HALO TRACTION APPLICATION;  Surgeon: Jadene Pierini, MD;  [facility];  Service: Neurosurgery;  Laterality: N/A;   JOINT REPLACEMENT     KNEE ARTHROSCOPY Right    ORIF SHOULDER FRACTURE Left 03/05/2018   Procedure: OPEN REDUCTION INTERNAL FIXATION (ORIF) LEFT SCAPULA;  Surgeon: Beverely Low, MD;  Location: [facility] OR;  Service: Orthopedics;  Laterality: Left;   POSTERIOR CERVICAL FUSION/FORAMINOTOMY N/A 12/06/2019   Procedure: Cervical one-two Posterior instrumented fusion;  Surgeon: Jadene Pierini, MD;  [facility];  Service: Neurosurgery;  Laterality: N/A;   POSTERIOR CERVICAL FUSION/FORAMINOTOMY N/A 02/13/2020   Procedure: Revision of cervical instrumented fusion with Occiput to Cervical Four posterior instrumented fusion;  Surgeon: Jadene Pierini, MD;  [facility];  Service: Neurosurgery;  Laterality: N/A;  posterior   POSTERIOR CERVICAL FUSION/FORAMINOTOMY N/A 02/16/2020   Procedure: Occiput to Thoracic Two Posterior Cervical Fusion with AIRO and Application of Halo;  Surgeon: Jadene Pierini, MD;  Location: [facility] OR;  Service: Neurosurgery;  Laterality: N/A;   REVERSE SHOULDER ARTHROPLASTY Left 05/15/2017   Procedure: LEFT REVERSE SHOULDER ARTHROPLASTY;  Surgeon: Beverely Low, MD;  Location: [facility] OR;  Service: Orthopedics;  Laterality: Left;   REVERSE SHOULDER ARTHROPLASTY Left 10/11/2018   Procedure: left shoulder irrigation and debridement, open poly exchange and removal of painful  hardware;  Surgeon: Beverely Low, MD;  [facility];  Service: Orthopedics;  Laterality: Left;   THYROID LOBECTOMY     TOTAL KNEE ARTHROPLASTY Right 06/15/2012   Procedure: RIGHT TOTAL KNEE ARTHROPLASTY;  Surgeon: [name], MD;  [facility];  Service: Orthopedics;  Laterality: Right;   WRIST SURGERY Left     Social History:  reports that she has never smoked. She has never used smokeless tobacco. She reports that she does not drink alcohol and does not use  drugs.  Allergies:  Allergies  Allergen Reactions   Meperidine Hcl     Other reaction(s): sick nausea/vomiting   Codeine Nausea And Vomiting    Other reaction(s): sick/ nausea/vomiting   Demerol [Meperidine] Nausea And Vomiting    Medications: No current facility-administered medications for this encounter.   Current Outpatient Medications  Medication Sig Dispense Refill   acetaminophen (TYLENOL) 650 MG CR tablet Take 1,300 mg by mouth daily. Take additional  1,300 mg of needed in the evening     alendronate (FOSAMAX) 70 MG tablet Take 70 mg by mouth once a week. Tuesday     Cyanocobalamin (VITAMIN B-12) 5000 MCG TBDP Take 5,000 mcg by mouth daily. (Patient taking differently: Take by mouth 3 (three) times a week. Energy) 30 tablet 0   docusate sodium (COLACE) 100 MG capsule Take 1 capsule (100 mg total) by mouth daily. (Patient taking differently: Take 100 mg by mouth daily as needed for mild constipation or moderate constipation.) 10 capsule 0   furosemide (LASIX) 20 MG tablet Take 1 tablet (20 mg total) by mouth daily as needed (as needed for swelling). 15 tablet 3   gabapentin (NEURONTIN) 100 MG capsule Take 1 capsule (100 mg total) by mouth 3 (three) times daily. 90 capsule 11   hydroxychloroquine (PLAQUENIL) 200 MG tablet TAKE ONE TABLET BY MOUTH ONE TIME DAILY 90 tablet 0   hyoscyamine (LEVBID) 0.375 MG 12 hr tablet Take 0.375 mg by mouth every 12 (twelve) hours as needed for cramping.     losartan (COZAAR) 50 MG tablet Take 50 mg by mouth 2 (two) times daily.     nystatin cream (MYCOSTATIN) Apply 1 application topically daily as needed.     omeprazole (PRILOSEC) 40 MG capsule Take 1 capsule (40 mg total) by mouth daily. 30 capsule 0   ondansetron (ZOFRAN) 4 MG tablet Take 4 mg by mouth every 8 (eight) hours as needed for nausea or vomiting.     polyethylene glycol (MIRALAX / GLYCOLAX) 17 g packet Take 17 g by mouth daily. (Patient taking differently: Take 17 g by mouth daily as  needed.) 14 each 0   potassium chloride SA (KLOR-CON) 20 MEQ tablet Take 1 tablet (20 mEq total) by mouth as needed (when you take Furosemide). 15 tablet 3   rosuvastatin (CRESTOR) 5 MG tablet Take 5 mg by mouth daily.     Teriparatide, Recombinant, (FORTEO) 600 MCG/2.4ML SOPN Inject 20 mcg into the skin daily. Discard after 28 days 2.4 mL 5   traMADol (ULTRAM) 50 MG tablet Take 50 mg by mouth every 6 (six) hours as needed for moderate pain.     zolpidem (AMBIEN) 10 MG tablet Take 10 mg by mouth at bedtime as needed for sleep.     amitriptyline (ELAVIL) 50 MG tablet Take 1 tablet (50 mg total) by mouth at bedtime. (Patient not taking: No sig reported) 30 tablet 1   amLODipine (NORVASC) 5 MG tablet Take 1 tablet (5 mg total)  by mouth daily. (Patient not taking: No sig reported) 30 tablet 0   Cholecalciferol (VITAMIN D) 125 MCG (5000 UT) CAPS Take 5,000 Units by mouth daily. (Patient not taking: No sig reported) 30 capsule 0   Insulin Pen Needle (PEN NEEDLES) 31G X 5 MM MISC Use to inject Forteo once daily. DO NOT RE-USE. 100 each 3   Insulin Pen Needle 31G X 5 MM MISC USE TO INJECT FORTEO ONCE DAILY. DO NOT RE-USE. 100 each 3    No results found for this or any previous visit (from the past 48 hour(s)). No results found.  ROS: Pain with rom of the left upper extremity  Physical Exam: Alert and oriented 80 y.o. female in no acute distress Cranial nerves 2-12 intact Cervical spine: full rom with no tenderness, nv intact distally Chest: active breath sounds bilaterally, no wheeze rhonchi or rales Heart: regular rate and rhythm, no murmur Abd: non tender non distended with active bowel sounds Hip is stable with rom  Left shoulder painful rom  Nv intact distally No rashes or edema distally No signs of infection  Assessment/Plan Assessment: left shoulder reverse total shoulder dislocation  Plan:  Patient will undergo a left shoulder revision by Dr. Ranell Patrick at [place] Risks benefits and  expectations were discussed with the patient. Patient understand risks, benefits and expectations and wishes to proceed. Preoperative templating of the joint replacement has been completed, documented, and submitted to the Operating Room personnel in order to optimize intra-operative equipment management.   Alphonsa Overall PA-C, MPAS [facility] is now [employer] [address], [place], [place] [address] Phone: [phone] [email or web address] Facebook  [employer]

## 2020-12-11 ENCOUNTER — Inpatient Hospital Stay (HOSPITAL_COMMUNITY): Payer: Medicare PPO | Admitting: Physician Assistant

## 2020-12-11 ENCOUNTER — Observation Stay (HOSPITAL_COMMUNITY): Payer: Medicare PPO

## 2020-12-11 ENCOUNTER — Encounter (HOSPITAL_COMMUNITY): Payer: Self-pay | Admitting: Orthopedic Surgery

## 2020-12-11 ENCOUNTER — Encounter (HOSPITAL_COMMUNITY)
Admission: RE | Disposition: A | Discharge status: Home / Self Care | Payer: Self-pay | Source: Ambulatory Visit | Attending: Orthopedic Surgery

## 2020-12-11 ENCOUNTER — Observation Stay (HOSPITAL_COMMUNITY)
Admission: RE | Admit: 2020-12-11 | Discharge: 2020-12-12 | Disposition: A | Discharge status: Home / Self Care | Payer: Medicare PPO | Source: Ambulatory Visit | Attending: Orthopedic Surgery | Admitting: Orthopedic Surgery

## 2020-12-11 DIAGNOSIS — Z79899 Other long term (current) drug therapy: Secondary | ICD-10-CM | POA: Insufficient documentation

## 2020-12-11 DIAGNOSIS — I10 Essential (primary) hypertension: Secondary | ICD-10-CM | POA: Diagnosis not present

## 2020-12-11 DIAGNOSIS — Z96612 Presence of left artificial shoulder joint: Secondary | ICD-10-CM | POA: Diagnosis not present

## 2020-12-11 DIAGNOSIS — T84028A Dislocation of other internal joint prosthesis, initial encounter: Secondary | ICD-10-CM | POA: Insufficient documentation

## 2020-12-11 DIAGNOSIS — Z794 Long term (current) use of insulin: Secondary | ICD-10-CM | POA: Insufficient documentation

## 2020-12-11 DIAGNOSIS — K219 Gastro-esophageal reflux disease without esophagitis: Secondary | ICD-10-CM | POA: Diagnosis not present

## 2020-12-11 DIAGNOSIS — Z96651 Presence of right artificial knee joint: Secondary | ICD-10-CM | POA: Insufficient documentation

## 2020-12-11 DIAGNOSIS — Y792 Prosthetic and other implants, materials and accessory orthopedic devices associated with adverse incidents: Secondary | ICD-10-CM | POA: Diagnosis not present

## 2020-12-11 DIAGNOSIS — Z981 Arthrodesis status: Secondary | ICD-10-CM | POA: Diagnosis not present

## 2020-12-11 DIAGNOSIS — G8918 Other acute postprocedural pain: Secondary | ICD-10-CM | POA: Diagnosis not present

## 2020-12-11 DIAGNOSIS — T84098A Other mechanical complication of other internal joint prosthesis, initial encounter: Secondary | ICD-10-CM | POA: Diagnosis not present

## 2020-12-11 DIAGNOSIS — M25312 Other instability, left shoulder: Secondary | ICD-10-CM | POA: Diagnosis not present

## 2020-12-11 DIAGNOSIS — Z471 Aftercare following joint replacement surgery: Secondary | ICD-10-CM | POA: Diagnosis not present

## 2020-12-11 HISTORY — DX: Localized edema: R60.0

## 2020-12-11 HISTORY — PX: SHOULDER HEMI-ARTHROPLASTY: SHX5049

## 2020-12-11 LAB — SURGICAL PCR SCREEN
MRSA, PCR: NEGATIVE
Staphylococcus aureus: POSITIVE — AB

## 2020-12-11 LAB — GLUCOSE, CAPILLARY: Glucose-Capillary: 77 mg/dL (ref 70–99)

## 2020-12-11 LAB — TYPE AND SCREEN
ABO/RH(D): O POS
Antibody Screen: NEGATIVE

## 2020-12-11 SURGERY — HEMIARTHROPLASTY, SHOULDER
Anesthesia: General | Site: Shoulder | Laterality: Left

## 2020-12-11 MED ORDER — ZOLPIDEM TARTRATE 5 MG PO TABS
5.0000 mg | ORAL_TABLET | Freq: Every evening | ORAL | Status: DC | PRN
  Administered 2020-12-12: 5 mg via ORAL
  Filled 2020-12-11: qty 1

## 2020-12-11 MED ORDER — INSULIN ASPART 100 UNIT/ML IJ SOLN: 0.0000 [IU] | Freq: Three times a day (TID) | INTRAMUSCULAR | Status: DC

## 2020-12-11 MED ORDER — ROSUVASTATIN CALCIUM 5 MG PO TABS
5.0000 mg | ORAL_TABLET | Freq: Every day | ORAL | Status: DC
  Administered 2020-12-12: 5 mg via ORAL
  Filled 2020-12-11 (×2): qty 1

## 2020-12-11 MED ORDER — PHENOL 1.4 % MT LIQD
1.0000 | OROMUCOSAL | Status: DC | PRN
  Filled 2020-12-11: qty 177

## 2020-12-11 MED ORDER — ORAL CARE MOUTH RINSE: 15.0000 mL | Freq: Once | OROMUCOSAL | Status: AC

## 2020-12-11 MED ORDER — POLYETHYLENE GLYCOL 3350 17 G PO PACK
17.0000 g | PACK | Freq: Every day | ORAL | Status: DC | PRN
  Filled 2020-12-11: qty 1

## 2020-12-11 MED ORDER — HYDROXYCHLOROQUINE SULFATE 200 MG PO TABS
200.0000 mg | ORAL_TABLET | Freq: Every day | ORAL | Status: DC
  Administered 2020-12-12: 200 mg via ORAL
  Filled 2020-12-11 (×2): qty 1

## 2020-12-11 MED ORDER — MIDAZOLAM HCL 2 MG/2ML IJ SOLN
1.0000 mg | INTRAMUSCULAR | Status: DC
Start: 2020-12-11 — End: 2020-12-11
  Administered 2020-12-11: 1 mg via INTRAVENOUS
  Filled 2020-12-11: qty 2

## 2020-12-11 MED ORDER — DEXAMETHASONE SODIUM PHOSPHATE 10 MG/ML IJ SOLN
INTRAMUSCULAR | Status: DC | PRN
  Administered 2020-12-11: 5 mg via INTRAVENOUS

## 2020-12-11 MED ORDER — ONDANSETRON HCL 4 MG/2ML IJ SOLN
INTRAMUSCULAR | Status: DC | PRN
  Administered 2020-12-11: 4 mg via INTRAVENOUS

## 2020-12-11 MED ORDER — FENTANYL CITRATE PF 50 MCG/ML IJ SOSY
50.0000 ug | PREFILLED_SYRINGE | INTRAMUSCULAR | Status: DC
  Administered 2020-12-11: 50 ug via INTRAVENOUS
  Filled 2020-12-11: qty 2

## 2020-12-11 MED ORDER — BUPIVACAINE-EPINEPHRINE (PF) 0.25% -1:200000 IJ SOLN
INTRAMUSCULAR | Status: AC
  Filled 2020-12-11: qty 30

## 2020-12-11 MED ORDER — POLYETHYLENE GLYCOL 3350 17 G PO PACK
17.0000 g | PACK | Freq: Every day | ORAL | Status: DC
  Filled 2020-12-11 (×2): qty 1

## 2020-12-11 MED ORDER — LIDOCAINE 2% (20 MG/ML) 5 ML SYRINGE
INTRAMUSCULAR | Status: DC | PRN
  Administered 2020-12-11: 40 mg via INTRAVENOUS

## 2020-12-11 MED ORDER — PANTOPRAZOLE SODIUM 40 MG PO TBEC
40.0000 mg | DELAYED_RELEASE_TABLET | Freq: Every day | ORAL | Status: DC
  Administered 2020-12-12: 40 mg via ORAL
  Filled 2020-12-11 (×2): qty 1

## 2020-12-11 MED ORDER — TRAMADOL HCL 50 MG PO TABS
50.0000 mg | ORAL_TABLET | Freq: Four times a day (QID) | ORAL | Status: DC | PRN
Start: 2020-12-11 — End: 2020-12-12
  Administered 2020-12-12 (×2): 50 mg via ORAL
  Filled 2020-12-11: qty 1

## 2020-12-11 MED ORDER — GABAPENTIN 100 MG PO CAPS
100.0000 mg | ORAL_CAPSULE | Freq: Three times a day (TID) | ORAL | Status: DC
  Administered 2020-12-11 – 2020-12-12 (×2): 100 mg via ORAL
  Filled 2020-12-11 (×2): qty 1

## 2020-12-11 MED ORDER — LACTATED RINGERS IV SOLN: INTRAVENOUS | Status: DC

## 2020-12-11 MED ORDER — LOSARTAN POTASSIUM 50 MG PO TABS
50.0000 mg | ORAL_TABLET | Freq: Two times a day (BID) | ORAL | Status: DC
  Administered 2020-12-11 – 2020-12-12 (×2): 50 mg via ORAL
  Filled 2020-12-11 (×2): qty 1

## 2020-12-11 MED ORDER — ONDANSETRON HCL 4 MG PO TABS
4.0000 mg | ORAL_TABLET | Freq: Four times a day (QID) | ORAL | Status: DC | PRN
  Filled 2020-12-11: qty 1

## 2020-12-11 MED ORDER — BISACODYL 10 MG RE SUPP
10.0000 mg | Freq: Every day | RECTAL | Status: DC | PRN
  Filled 2020-12-11: qty 1

## 2020-12-11 MED ORDER — INSULIN ASPART 100 UNIT/ML IJ SOLN
3.0000 [IU] | Freq: Three times a day (TID) | INTRAMUSCULAR | Status: DC
  Administered 2020-12-12: 3 [IU] via SUBCUTANEOUS

## 2020-12-11 MED ORDER — FUROSEMIDE 20 MG PO TABS
20.0000 mg | ORAL_TABLET | Freq: Every day | ORAL | Status: DC | PRN
  Filled 2020-12-11: qty 1

## 2020-12-11 MED ORDER — ACETAMINOPHEN ER 650 MG PO TBCR: 1300.0000 mg | EXTENDED_RELEASE_TABLET | Freq: Every day | ORAL | Status: DC

## 2020-12-11 MED ORDER — MIDAZOLAM HCL 2 MG/2ML IJ SOLN
INTRAMUSCULAR | Status: AC
  Filled 2020-12-11: qty 2

## 2020-12-11 MED ORDER — ONDANSETRON HCL 4 MG PO TABS: 4.0000 mg | ORAL_TABLET | Freq: Three times a day (TID) | ORAL | Status: DC | PRN

## 2020-12-11 MED ORDER — AMITRIPTYLINE HCL 50 MG PO TABS
50.0000 mg | ORAL_TABLET | Freq: Every day | ORAL | Status: DC
  Administered 2020-12-11: 50 mg via ORAL
  Filled 2020-12-11: qty 1

## 2020-12-11 MED ORDER — PROPOFOL 10 MG/ML IV BOLUS
INTRAVENOUS | Status: DC | PRN
  Administered 2020-12-11: 100 mg via INTRAVENOUS
  Administered 2020-12-11: 50 mg via INTRAVENOUS

## 2020-12-11 MED ORDER — ONDANSETRON HCL 4 MG/2ML IJ SOLN: 4.0000 mg | Freq: Once | INTRAMUSCULAR | Status: DC | PRN

## 2020-12-11 MED ORDER — MIDAZOLAM HCL 2 MG/2ML IJ SOLN
INTRAMUSCULAR | Status: DC | PRN
  Administered 2020-12-11 (×2): .5 mg via INTRAVENOUS

## 2020-12-11 MED ORDER — PHENYLEPHRINE HCL-NACL 20-0.9 MG/250ML-% IV SOLN
INTRAVENOUS | Status: DC | PRN
  Administered 2020-12-11: 20 ug/min via INTRAVENOUS

## 2020-12-11 MED ORDER — ONDANSETRON HCL 4 MG/2ML IJ SOLN: 4.0000 mg | Freq: Four times a day (QID) | INTRAMUSCULAR | Status: DC | PRN

## 2020-12-11 MED ORDER — HYOSCYAMINE SULFATE ER 0.375 MG PO TB12
0.3750 mg | ORAL_TABLET | Freq: Two times a day (BID) | ORAL | Status: DC | PRN
  Filled 2020-12-11: qty 1

## 2020-12-11 MED ORDER — DOCUSATE SODIUM 100 MG PO CAPS: 100.0000 mg | ORAL_CAPSULE | Freq: Every day | ORAL | Status: DC

## 2020-12-11 MED ORDER — SODIUM CHLORIDE 0.9 % IV SOLN: INTRAVENOUS | Status: DC

## 2020-12-11 MED ORDER — TRANEXAMIC ACID-NACL 1000-0.7 MG/100ML-% IV SOLN
1000.0000 mg | Freq: Once | INTRAVENOUS | Status: AC
  Administered 2020-12-12: 1000 mg via INTRAVENOUS

## 2020-12-11 MED ORDER — AMLODIPINE BESYLATE 5 MG PO TABS
5.0000 mg | ORAL_TABLET | Freq: Every day | ORAL | Status: DC
  Administered 2020-12-12: 5 mg via ORAL
  Filled 2020-12-11 (×2): qty 1

## 2020-12-11 MED ORDER — MENTHOL 3 MG MT LOZG
1.0000 | LOZENGE | OROMUCOSAL | Status: DC | PRN
  Filled 2020-12-11: qty 9

## 2020-12-11 MED ORDER — TRAMADOL HCL 50 MG PO TABS: 50.0000 mg | ORAL_TABLET | Freq: Four times a day (QID) | ORAL | 0 refills | Status: DC | PRN

## 2020-12-11 MED ORDER — HYDROMORPHONE HCL 1 MG/ML IJ SOLN: 0.2500 mg | INTRAMUSCULAR | Status: DC | PRN

## 2020-12-11 MED ORDER — FENTANYL CITRATE (PF) 250 MCG/5ML IJ SOLN
INTRAMUSCULAR | Status: AC
  Filled 2020-12-11: qty 5

## 2020-12-11 MED ORDER — SODIUM CHLORIDE 0.9 % IR SOLN
Status: DC | PRN
  Administered 2020-12-11: 1000 mL

## 2020-12-11 MED ORDER — CHLORHEXIDINE GLUCONATE 0.12 % MT SOLN
15.0000 mL | Freq: Once | OROMUCOSAL | Status: AC
  Administered 2020-12-11: 15 mL via OROMUCOSAL

## 2020-12-11 MED ORDER — BUPIVACAINE-EPINEPHRINE 0.25% -1:200000 IJ SOLN
INTRAMUSCULAR | Status: DC | PRN
  Administered 2020-12-11: 20 mL

## 2020-12-11 MED ORDER — ACETAMINOPHEN 325 MG PO TABS: 325.0000 mg | ORAL_TABLET | Freq: Four times a day (QID) | ORAL | Status: DC | PRN

## 2020-12-11 MED ORDER — BUPIVACAINE HCL (PF) 0.5 % IJ SOLN
INTRAMUSCULAR | Status: DC | PRN
  Administered 2020-12-11: 15 mL via PERINEURAL

## 2020-12-11 MED ORDER — TRANEXAMIC ACID-NACL 1000-0.7 MG/100ML-% IV SOLN
INTRAVENOUS | Status: DC | PRN
  Administered 2020-12-11: 1000 mg via INTRAVENOUS

## 2020-12-11 MED ORDER — ALENDRONATE SODIUM 70 MG PO TABS: 70.0000 mg | ORAL_TABLET | ORAL | Status: DC

## 2020-12-11 MED ORDER — HYDROCODONE-ACETAMINOPHEN 5-325 MG PO TABS
1.0000 | ORAL_TABLET | ORAL | Status: DC | PRN
  Administered 2020-12-12: 2 via ORAL
  Administered 2020-12-12: 1 via ORAL
  Filled 2020-12-11: qty 1
  Filled 2020-12-11: qty 2

## 2020-12-11 MED ORDER — POTASSIUM CHLORIDE CRYS ER 20 MEQ PO TBCR
20.0000 meq | EXTENDED_RELEASE_TABLET | Freq: Every day | ORAL | Status: DC | PRN
  Filled 2020-12-11: qty 1

## 2020-12-11 MED ORDER — MORPHINE SULFATE (PF) 4 MG/ML IV SOLN: 0.5000 mg | INTRAVENOUS | Status: DC | PRN

## 2020-12-11 MED ORDER — TRANEXAMIC ACID-NACL 1000-0.7 MG/100ML-% IV SOLN
INTRAVENOUS | Status: AC
  Filled 2020-12-11: qty 100

## 2020-12-11 MED ORDER — CEFAZOLIN SODIUM-DEXTROSE 2-4 GM/100ML-% IV SOLN
2.0000 g | INTRAVENOUS | Status: AC
  Administered 2020-12-11: 2 g via INTRAVENOUS
  Filled 2020-12-11: qty 100

## 2020-12-11 MED ORDER — DOCUSATE SODIUM 100 MG PO CAPS
100.0000 mg | ORAL_CAPSULE | Freq: Two times a day (BID) | ORAL | Status: DC
  Administered 2020-12-11 – 2020-12-12 (×2): 100 mg via ORAL
  Filled 2020-12-11 (×2): qty 1

## 2020-12-11 MED ORDER — CEFAZOLIN SODIUM-DEXTROSE 2-4 GM/100ML-% IV SOLN
2.0000 g | Freq: Four times a day (QID) | INTRAVENOUS | Status: AC
  Administered 2020-12-12 (×3): 2 g via INTRAVENOUS
  Filled 2020-12-11 (×2): qty 100

## 2020-12-11 MED ORDER — BUPIVACAINE LIPOSOME 1.3 % IJ SUSP
INTRAMUSCULAR | Status: DC | PRN
  Administered 2020-12-11: 10 mL via PERINEURAL

## 2020-12-11 MED ORDER — FENTANYL CITRATE (PF) 250 MCG/5ML IJ SOLN
INTRAMUSCULAR | Status: DC | PRN
  Administered 2020-12-11 (×3): 25 ug via INTRAVENOUS

## 2020-12-11 MED ORDER — METOCLOPRAMIDE HCL 5 MG PO TABS
5.0000 mg | ORAL_TABLET | Freq: Three times a day (TID) | ORAL | Status: DC | PRN
  Filled 2020-12-11: qty 2

## 2020-12-11 MED ORDER — METOCLOPRAMIDE HCL 5 MG/ML IJ SOLN: 5.0000 mg | Freq: Three times a day (TID) | INTRAMUSCULAR | Status: DC | PRN

## 2020-12-11 MED ORDER — VITAMIN D3 25 MCG (1000 UNIT) PO TABS
5000.0000 [IU] | ORAL_TABLET | Freq: Every day | ORAL | Status: DC
  Administered 2020-12-12: 5000 [IU] via ORAL
  Filled 2020-12-11 (×2): qty 5

## 2020-12-11 SURGICAL SUPPLY — 57 items
AID PSTN UNV HD RSTRNT DISP (MISCELLANEOUS)
BAG COUNTER SPONGE SURGICOUNT (BAG) IMPLANT
BAG SPEC THK2 15X12 ZIP CLS (MISCELLANEOUS) ×1
BAG SPNG CNTER NS LX DISP (BAG)
BAG ZIPLOCK 12X15 (MISCELLANEOUS) ×2 IMPLANT
BIT DRILL 1.6MX128 (BIT) ×2 IMPLANT
BLADE SAG 18X100X1.27 (BLADE) ×2 IMPLANT
CLSR STERI-STRIP ANTIMIC 1/2X4 (GAUZE/BANDAGES/DRESSINGS) ×1 IMPLANT
COVER BACK TABLE 60X90IN (DRAPES) ×2 IMPLANT
COVER SURGICAL LIGHT HANDLE (MISCELLANEOUS) ×2 IMPLANT
DRAPE ORTHO SPLIT 77X108 STRL (DRAPES) ×4
DRAPE SURG ORHT 6 SPLT 77X108 (DRAPES) ×2 IMPLANT
DRAPE U-SHAPE 47X51 STRL (DRAPES) ×2 IMPLANT
DRSG ADAPTIC 3X8 NADH LF (GAUZE/BANDAGES/DRESSINGS) ×1 IMPLANT
DRSG EMULSION OIL 3X16 NADH (GAUZE/BANDAGES/DRESSINGS) ×2 IMPLANT
DRSG PAD ABDOMINAL 8X10 ST (GAUZE/BANDAGES/DRESSINGS) ×2 IMPLANT
DURAPREP 26ML APPLICATOR (WOUND CARE) ×2 IMPLANT
ELECT BLADE TIP CTD 4 INCH (ELECTRODE) ×2 IMPLANT
ELECT NDL TIP 2.8 STRL (NEEDLE) ×1 IMPLANT
ELECT NEEDLE TIP 2.8 STRL (NEEDLE) ×2 IMPLANT
GAUZE SPONGE 4X4 12PLY STRL (GAUZE/BANDAGES/DRESSINGS) ×2 IMPLANT
GLOVE SURG ORTHO LTX SZ7.5 (GLOVE) ×2 IMPLANT
GLOVE SURG ORTHO LTX SZ8.5 (GLOVE) ×2 IMPLANT
GLOVE SURG UNDER POLY LF SZ7.5 (GLOVE) ×4 IMPLANT
GLOVE SURG UNDER POLY LF SZ8.5 (GLOVE) ×4 IMPLANT
GOWN STRL REUS W/TWL LRG LVL3 (GOWN DISPOSABLE) ×4 IMPLANT
HEAD HUM DXTEND 52X21 (Shoulder) ×1 IMPLANT
IV NS 1000ML (IV SOLUTION) ×2
IV NS 1000ML BAXH (IV SOLUTION) ×1 IMPLANT
KIT BASIN OR (CUSTOM PROCEDURE TRAY) ×2 IMPLANT
KIT TURNOVER KIT A (KITS) ×2 IMPLANT
MANIFOLD NEPTUNE II (INSTRUMENTS) ×2 IMPLANT
NDL HYPO 25X1 1.5 SAFETY (NEEDLE) ×1 IMPLANT
NDL MAYO CATGUT SZ4 TPR NDL (NEEDLE) ×1 IMPLANT
NEEDLE HYPO 25X1 1.5 SAFETY (NEEDLE) ×2 IMPLANT
NEEDLE MAYO CATGUT SZ4 (NEEDLE) ×2 IMPLANT
PACK SHOULDER (CUSTOM PROCEDURE TRAY) ×2 IMPLANT
PASSER SUT SWANSON 36MM LOOP (INSTRUMENTS) IMPLANT
PROTECTOR NERVE ULNAR (MISCELLANEOUS) ×2 IMPLANT
RESTRAINT HEAD UNIVERSAL NS (MISCELLANEOUS) IMPLANT
SLING ARM IMMOBILIZER LRG (SOFTGOODS) ×2 IMPLANT
SPONGE T-LAP 4X18 ~~LOC~~+RFID (SPONGE) IMPLANT
STAPLER VISISTAT 35W (STAPLE) ×2 IMPLANT
STRIP CLOSURE SKIN 1/2X4 (GAUZE/BANDAGES/DRESSINGS) ×2 IMPLANT
SUCTION FRAZIER HANDLE 10FR (MISCELLANEOUS)
SUCTION TUBE FRAZIER 10FR DISP (MISCELLANEOUS) IMPLANT
SUT FIBERWIRE #2 38 T-5 BLUE (SUTURE) ×4
SUT MNCRL AB 4-0 PS2 18 (SUTURE) ×2 IMPLANT
SUT VIC AB 0 CT1 36 (SUTURE) ×4 IMPLANT
SUT VIC AB 0 CT2 27 (SUTURE) ×2 IMPLANT
SUT VIC AB 2-0 CT1 27 (SUTURE)
SUT VIC AB 2-0 CT1 TAPERPNT 27 (SUTURE) IMPLANT
SUTURE FIBERWR #2 38 T-5 BLUE (SUTURE) ×2 IMPLANT
SYR CONTROL 10ML LL (SYRINGE) ×2 IMPLANT
TOWEL OR 17X26 10 PK STRL BLUE (TOWEL DISPOSABLE) ×4 IMPLANT
TOWER CARTRIDGE SMART MIX (DISPOSABLE) IMPLANT
WATER STERILE IRR 1000ML POUR (IV SOLUTION) ×2 IMPLANT

## 2020-12-11 NOTE — Telephone Encounter (Signed)
Called patient to notify about Harriette Ohara approval. Patient has not yet started sample that was provided at [facility] last week since she is having surgery.  We will wait to assess if she is able to tolerate Harriette Ohara 5mg  once daily without side effects before sending a prescription to [facility]. Husband notified of copay of $100 per month and remain amenable to paying this.  Refill for [facility] can be sent to [facility] at f/u visit on 03/14/21  05/12/21, PharmD, MPH, BCPS Clinical Pharmacist (Rheumatology and Pulmonology)

## 2020-12-11 NOTE — Discharge Instructions (Signed)
Ice to the shoulder constantly.  Keep the incision covered and clean and dry for one week, then ok to get it wet in the shower.  Do gentle exercise as instructed several times per day.  DO NOT reach behind your back or push up out of a chair with the operative arm.  Use a sling while you are up and around for comfort, may remove while seated.  Keep pillow propped behind the operative elbow.  Follow up with Dr Jameire Kouba in two weeks in the office, call [phone] for appt  

## 2020-12-11 NOTE — Transfer of Care (Signed)
Immediate Anesthesia Transfer of Care Note  Patient: Michele Bryan  Procedure(s) Performed: Left reverse shoulder conversion to SHOULDER HEMI-ARTHROPLASTY (Left: Shoulder)  Patient Location: PACU  Anesthesia Type:Regional and GA combined with regional for post-op pain  Level of Consciousness: awake, alert , oriented and patient cooperative  Airway & Oxygen Therapy: Patient Spontanous Breathing and Patient connected to face mask oxygen  Post-op Assessment: Report given to RN and Post -op Vital signs reviewed and stable  Post vital signs: Reviewed and stable  Last Vitals:  Vitals Value Taken Time  BP 143/75 12/11/20 1924  Temp    Pulse 80 12/11/20 1929  Resp 17 12/11/20 1929  SpO2 98 % 12/11/20 1929  Vitals shown include unvalidated device data.  Last Pain:  Vitals:   12/11/20 1525  TempSrc:   PainSc: 4          Complications: No notable events documented.

## 2020-12-11 NOTE — Brief Op Note (Signed)
12/11/2020  7:17 PM  PATIENT:  Michele Bryan  80 y.o. female  PRE-OPERATIVE DIAGNOSIS:  reverse total shoulder dislocation  POST-OPERATIVE DIAGNOSIS:  reverse total shoulder dislocation, chronic instability of implants  PROCEDURE:  Procedure(s) with comments: Left reverse shoulder conversion to SHOULDER HEMI-ARTHROPLASTY (Left) - with ISB Conversion to Delta CTA head 52-12  SURGEON:  Surgeon(s) and Role:    Beverely Low, MD - Primary  PHYSICIAN ASSISTANT:   ASSISTANTS: Thea Gist, PA-C   ANESTHESIA:   regional and general  EBL:  150 mL   BLOOD ADMINISTERED:none  DRAINS: none   LOCAL MEDICATIONS USED:  MARCAINE     SPECIMEN:  No Specimen  DISPOSITION OF SPECIMEN:  N/A  COUNTS:  YES  TOURNIQUET:  * No tourniquets in log *  DICTATION: [id number] dict number  PLAN OF CARE: Admit for overnight observation  PATIENT DISPOSITION:  PACU - hemodynamically stable.   Delay start of Pharmacological VTE agent (>24hrs) due to surgical blood loss or risk of bleeding: not applicable

## 2020-12-11 NOTE — Interval H&P Note (Signed)
History and Physical Interval Note:  12/11/2020 5:25 PM  Michele Bryan  has presented today for surgery, with the diagnosis of reverse total shoulder dislocation.  The various methods of treatment have been discussed with the patient and family. After consideration of risks, benefits and other options for treatment, the patient has consented to  Procedure(s) with comments: Left reverse shoulder conversion to SHOULDER HEMI-ARTHROPLASTY (Left) - with ISB as a surgical intervention.  The patient's history has been reviewed, patient examined, no change in status, stable for surgery.  I have reviewed the patient's chart and labs.  Questions were answered to the patient's satisfaction.     [name]

## 2020-12-11 NOTE — Anesthesia Procedure Notes (Addendum)
Procedure Name: Intubation Date/Time: 12/11/2020 5:41 PM Performed by: Minerva Ends, CRNA Pre-anesthesia Checklist: Patient identified, Emergency Drugs available, Suction available and Patient being monitored Patient Re-evaluated:Patient Re-evaluated prior to induction Oxygen Delivery Method: Circle System Utilized Preoxygenation: Pre-oxygenation with 100% oxygen Induction Type: IV induction Ventilation: Mask ventilation without difficulty and Mask ventilation with difficulty Laryngoscope Size: Glidescope Tube type: Oral Number of attempts: 1 Airway Equipment and Method: Stylet Placement Confirmation: ETT inserted through vocal cords under direct vision, positive ETCO2 and breath sounds checked- equal and bilateral Secured at: 20 cm Tube secured with: Tape Dental Injury: Teeth and Oropharynx as per pre-operative assessment  Difficulty Due To: Difficult Airway- due to reduced neck mobility, Difficulty was anticipated, Difficult Airway-  due to neck instability and Difficult Airway- due to limited oral opening Comments: Smooth IV induction - Glidescope intubation due to neck concerns- head and neck remained neutral for intubation and head holder placement by PA-  slight chipping front teeth- bilat Bs Malen Gauze

## 2020-12-11 NOTE — Anesthesia Postprocedure Evaluation (Signed)
Anesthesia Post Note  Patient: Michele Bryan  Procedure(s) Performed: Left reverse shoulder conversion to SHOULDER HEMI-ARTHROPLASTY (Left: Shoulder)     Patient location during evaluation: PACU Anesthesia Type: General Level of consciousness: awake and alert and oriented Pain management: pain level controlled Vital Signs Assessment: post-procedure vital signs reviewed and stable Respiratory status: spontaneous breathing, nonlabored ventilation and respiratory function stable Cardiovascular status: blood pressure returned to baseline and stable Postop Assessment: no apparent nausea or vomiting Anesthetic complications: no   No notable events documented.  Last Vitals:  Vitals:   12/11/20 1930 12/11/20 1945  BP: 130/88 132/72  Pulse: 78 75  Resp: 18 12  Temp: 36.8 C   SpO2: 99% 96%    Last Pain:  Vitals:   12/11/20 1945  TempSrc:   PainSc: 0-No pain                 [name] A.

## 2020-12-11 NOTE — Progress Notes (Signed)
PHARMACIST - PHYSICIAN COMMUNICATION  CONCERNING: P&T Medication Policy Regarding Oral Bisphosphonates  RECOMMENDATION: Your order for alendronate (Fosamax) has been discontinued at this time.  If the patient's post-hospital medical condition warrants safe use of this class of drugs, please resume the pre-hospital regimen upon discharge.  DESCRIPTION:  Alendronate (Fosamax) can cause severe esophageal erosions in patients who are unable to remain upright at least 30 minutes after taking this medication.   Since brief interruptions in therapy are thought to have minimal impact on bone mineral density, the Pharmacy & Therapeutics Committee has established that bisphosphonate orders should be routinely discontinued during hospitalization.   To override this safety policy and permit administration of Fosamax in the hospital, prescribers must write "DO NOT HOLD" in the comments section when placing the order for this class of medications.   Joey Lierman, PharmD 

## 2020-12-11 NOTE — Anesthesia Procedure Notes (Signed)
Anesthesia Regional Block: Interscalene brachial plexus block   Pre-Anesthetic Checklist: , timeout performed,  Correct Patient, Correct Site, Correct Laterality,  Correct Procedure, Correct Position, site marked,  Risks and benefits discussed,  Surgical consent,  Pre-op evaluation,  At surgeon's request and post-op pain management  Laterality: Left  Prep: chloraprep       Needles:  Injection technique: Single-shot  Needle Type: Echogenic Stimulator Needle     Needle Length: 10cm  Needle Gauge: 21   Needle insertion depth: 6 cm   Additional Needles:   Narrative:  Start time: 12/11/2020 3:50 PM End time: 12/11/2020 3:55 PM Injection made incrementally with aspirations every 5 mL.  Performed by: Personally  Anesthesiologist: Mal Amabile, MD  Additional Notes: Timeout performed. Patient sedated. Relevant anatomy ID'd using [place]. Incremental 2-57ml injection of LA with frequent aspiration. Patient tolerated procedure well.    Left Interscalene Block

## 2020-12-11 NOTE — Progress Notes (Signed)
Assisted Dr. Foster with left, ultrasound guided, interscalene  block. Side rails up, monitors on throughout procedure. See vital signs in flow sheet. Tolerated Procedure well.  

## 2020-12-12 ENCOUNTER — Other Ambulatory Visit: Payer: Self-pay

## 2020-12-12 DIAGNOSIS — Z96651 Presence of right artificial knee joint: Secondary | ICD-10-CM | POA: Diagnosis not present

## 2020-12-12 DIAGNOSIS — Z794 Long term (current) use of insulin: Secondary | ICD-10-CM | POA: Diagnosis not present

## 2020-12-12 DIAGNOSIS — T84028A Dislocation of other internal joint prosthesis, initial encounter: Secondary | ICD-10-CM | POA: Diagnosis not present

## 2020-12-12 DIAGNOSIS — I10 Essential (primary) hypertension: Secondary | ICD-10-CM | POA: Diagnosis not present

## 2020-12-12 DIAGNOSIS — Z79899 Other long term (current) drug therapy: Secondary | ICD-10-CM | POA: Diagnosis not present

## 2020-12-12 LAB — GLUCOSE, CAPILLARY
Glucose-Capillary: 95 mg/dL (ref 70–99)
Glucose-Capillary: 93 mg/dL (ref 70–99)

## 2020-12-12 LAB — HEMOGLOBIN A1C
Hgb A1c MFr Bld: 5.3 % (ref 4.8–5.6)
Mean Plasma Glucose: 105.41 mg/dL

## 2020-12-12 LAB — BASIC METABOLIC PANEL
Anion gap: 10 (ref 5–15)
BUN: 20 mg/dL (ref 8–23)
CO2: 23 mmol/L (ref 22–32)
Calcium: 8.7 mg/dL — ABNORMAL LOW (ref 8.9–10.3)
Chloride: 105 mmol/L (ref 98–111)
Creatinine, Ser: 0.67 mg/dL (ref 0.44–1.00)
GFR, Estimated: >60 mL/min (ref >60)
Glucose, Bld: 153 mg/dL — ABNORMAL HIGH (ref 70–99)
Potassium: 4.1 mmol/L (ref 3.5–5.1)
Sodium: 138 mmol/L (ref 135–145)

## 2020-12-12 LAB — HEMOGLOBIN AND HEMATOCRIT, BLOOD
HCT: 29.6 % — ABNORMAL LOW (ref 36.0–46.0)
Hemoglobin: 9.5 g/dL — ABNORMAL LOW (ref 12.0–15.0)

## 2020-12-12 MED ORDER — TRAMADOL HCL 50 MG PO TABS
ORAL_TABLET | ORAL | Status: AC
  Filled 2020-12-12: qty 1

## 2020-12-12 MED ORDER — CEFAZOLIN SODIUM-DEXTROSE 2-4 GM/100ML-% IV SOLN
INTRAVENOUS | Status: AC
  Filled 2020-12-12: qty 100

## 2020-12-12 MED ORDER — TRANEXAMIC ACID-NACL 1000-0.7 MG/100ML-% IV SOLN
INTRAVENOUS | Status: AC
  Filled 2020-12-12: qty 100

## 2020-12-12 NOTE — Progress Notes (Signed)
Orthopedics Progress Note  Subjective: Left shoulder block has worn off and she is reporting moderate pain  Objective:  Vitals:   12/12/20 0248 12/12/20 0407  BP: 128/75 122/65  Pulse: 89 82  Resp: 20 20  Temp: 98.2 F (36.8 C) 98.1 F (36.7 C)  SpO2: 98% 98%    General: Awake and alert  Musculoskeletal: Left shoulder wound CDI Neurovascularly intact  Lab Results  Component Value Date   WBC 6.6 12/06/2020   HGB 9.5 (L) 12/12/2020   HCT 29.6 (L) 12/12/2020   MCV 89.0 12/06/2020   PLT 184 12/06/2020       Component Value Date/Time   NA 138 12/12/2020 0329   K 4.1 12/12/2020 0329   CL 105 12/12/2020 0329   CO2 23 12/12/2020 0329   GLUCOSE 153 (H) 12/12/2020 0329   BUN 20 12/12/2020 0329   CREATININE 0.67 12/12/2020 0329   CREATININE 0.63 12/06/2020 1130   CALCIUM 8.7 (L) 12/12/2020 0329   GFRNONAA >60 12/12/2020 0329   GFRNONAA 85 04/03/2020 1030   GFRAA 98 04/03/2020 1030    Lab Results  Component Value Date   INR 1.03 06/08/2012    Assessment/Plan: POD #1  s/p Procedure(s): Left reverse shoulder conversion to SHOULDER HEMI-ARTHROPLASTY OT, PT this morning. Possible discharge later today but patient needs to be cleared by PT for transition to home  Viviann Spare R. Ranell Patrick, MD 12/12/2020 7:50 AM

## 2020-12-12 NOTE — Op Note (Signed)
NAME: Michele Bryan, Michele Bryan MEDICAL RECORD NO: [id number] ACCOUNT NO: [id number] DATE OF BIRTH: May 14, 1940 FACILITY: [facility] LOCATION: [facility] PHYSICIAN: Almedia Balls. Ranell Patrick, MD  Operative Report   DATE OF PROCEDURE: 12/11/2020   PREOPERATIVE DIAGNOSIS:  Left reverse shoulder dislocation.  POSTOPERATIVE DIAGNOSES:   1.  Left reverse shoulder dislocation. 2.  Left chronic shoulder implant instability.  PROCEDURE PERFORMED:  Left revision shoulder with conversion of reverse shoulder replacement to CTA head hemiarthroplasty utilizing delta stem.   ATTENDING SURGEON:  Dr. Malon Kindle.  ASSISTANT:  Modesto Charon, [place], who was scrubbed during the entire procedure, and necessary for satisfactory completion of surgery.  General anesthesia was used plus interscalene block.  ESTIMATED BLOOD LOSS:  150 mL.  FLUID REPLACEMENT: 1200 mL crystalloid.  Instrument counts were correct and there were no complications.  Perioperative antibiotics were given.  INDICATIONS:  The patient is a 80 year old female with a history of worsening left shoulder pain and dysfunction.  The patient presented to the orthopedic office where x-rays demonstrated a dislocated reverse shoulder replacement.  The patient also had a  previous complication of a scapular spine fracture, which was fixed and then she went on to develop an acromial fracture, felt secondary to osteoporosis.  The patient states that she has been doing well until she felt something pop in her shoulder and  complained of pain.  Family states that she may have been hurting a little longer than this, but she presented essentially with an unstable shoulder joint and the thought was to attempt to try to perform an open reduction with lengthening on the humeral  side to potentially retention the shoulder versus conversion to hemiarthroplasty if we were unable to create a stable shoulder.  Risks and benefits discussed, informed consent obtained.  DESCRIPTION  OF PROCEDURE:  After an adequate level of anesthesia was achieved, the patient positioned in the modified beach chair position.  Left shoulder correctly identified and sterilely prepped and draped in the usual manner.  Timeout called,  verifying correct patient, correct site.  We entered the patient's shoulder using the patient's prior deltopectoral incision, we infiltrated the skin with 0.25% Marcaine with epinephrine, followed by incision with an 11 blade scalpel.  Dissection down  through subcutaneous tissues.  We identified the previous side dissection plane in between the pectoralis and the deltoid.  We also identified the cephalic vein, which was still intact and we took that laterally.  We then identified the conjoined tendon  and dissected that free of the pseudocapsule inferiorly.  Once we had the deltoid elevated and the pectoralis elevated and retracted then, I went ahead and took a window out of the anterior capsule.  It was clear.  The humerus was subluxed posteriorly.   Basically, the dislocation was straight inferior and somewhat posterior.  I was able to with some difficulty and after removing some scar tissue and some capsule get the shoulder reduced, with any internal rotation it would re-dislocate.  We noted there  to be multiple areas on the humeral plastic that there was severe wear indicating that this was chronic.  We did do a complete release on the humeral side and also release around the glenosphere and we just could not get this humerus, stable.  I removed  first the plastic from the humeral side.  We checked eversion, which was 10 degrees of retroversion and made sure the stem was stable on the humeral side with the broach handle, which it was.  We then went to  the glenoid side and removed the glenosphere,  checked the baseplate, baseplate was stable, glenosphere had some scuffs on it.  We then used my finger on the glenosphere essentially although the glenosphere was still  attached to the glenoid neck and the glenoid, it felt like the metaglene baseplate  was very unstable.  Basically, it felt like a floating shoulder where the scapula was not a stable anchor point and thus would be very difficult to create any type of a stable situation in her shoulder.  Decision was made immediately to remove the 3  screws from her glenoid baseplate and to take the baseplate off, which we did without difficulty.  There were no signs of any infection.  The fluid was clear.  The tissue looked really normal inside, but we did go ahead and removed the baseplate.  I then  trialed with a 52:12 CTA head and that fit perfectly in the vault.  Basically, the patient's CA ligament was still intact.  The coracoid was still providing anterior restraint and there was good soft tissue buttress posteriorly so with the 52:12 in it  was very stable.  This filled up the space in the subdeltoid subacromial space very well and provided very stable articulation.  We then removed the trial a CTA head and selected the real 52:12 CTA head and impacted that onto the humeral tray and checked  for stability and it was stable and attached.  We then reduced the shoulder and had a really nice stable Alfred Levins as it was reducing.  Once it was in there, we ranged the shoulder again very stable.  No tendency towards dislocation.  It really want to be  directly centered in that subacromial area in the glenoid vault.  So with that stable, we went ahead and irrigated, did our final check for any bleeding and used the Bovie.  We did use some IV TXA as the patient started with a lower hemoglobin and was  just generalized some bleeding throughout the case, no more than 150 of blood loss, but after we had irrigated well, we went ahead and repaired the deltopectoral interval with 0 PDS suture followed by 2-0 Vicryl and staples for skin.  A sterile  compressive bandage applied in a sling.  The patient transported to recovery room in  stable condition.      SUJ D: 12/11/2020 7:27:28 pm T: 12/12/2020 1:42:00 am  JOB: [id number]/ [id number]

## 2020-12-12 NOTE — Discharge Summary (Signed)
In most cases prophylactic antibiotics for Dental procdeures after total joint surgery are not necessary.  Exceptions are as follows:  1. History of prior total joint infection  2. Severely immunocompromised (Organ Transplant, cancer chemotherapy, Rheumatoid biologic meds such as Humera)  3. Poorly controlled diabetes (A1C &gt; 8.0, blood glucose over 200)  If you have one of these conditions, contact your surgeon for an antibiotic prescription, prior to your dental procedure. Orthopedic Discharge Summary        Physician Discharge Summary  Patient ID: Michele Bryan MRN: [id number] DOB/AGE: [birth year] 80 y.o.  Admit date: 12/11/2020 Discharge date: 12/12/2020   Procedures:  Procedure(s) (LRB): Left reverse shoulder conversion to SHOULDER HEMI-ARTHROPLASTY (Left)  Attending Physician:  Dr. Malon Kindle  Admission Diagnoses:   left shoulder painful reverse total shoulder  Discharge Diagnoses:  left shoulder painful reverse total shoulder   Past Medical History:  Diagnosis Date   Anemia    Arthritis    GERD (gastroesophageal reflux disease)    History of blood transfusion    Hyperlipidemia    Hypertension    Lower extremity edema    MS (multiple sclerosis) (HCC)    Neuromuscular disorder (HCC)    multiple scleroosis/peripheral neuropathy   PONV (postoperative nausea and vomiting)    Rheumatoid arthritis (HCC)    Ulcer    Vitamin D deficiency     PCP: Sigmund Hazel, MD   Discharged Condition: good  Hospital Course:  Patient underwent the above stated procedure on 12/11/2020. Patient tolerated the procedure well and brought to the recovery room in good condition and subsequently to the floor. Patient had an uncomplicated hospital course and was stable for discharge.   Disposition: Discharge disposition: 01-Home or Self Care      with follow up in 2 weeks    Follow-up Information     Beverely Low, MD. Call in 2 week(s).   Specialty: Orthopedic  Surgery Why: [phone] call for appt Contact information: [address] [place] [address] [place] [address] [phone]                 Dental Antibiotics:  In most cases prophylactic antibiotics for Dental procdeures after total joint surgery are not necessary.  Exceptions are as follows:  1. History of prior total joint infection  2. Severely immunocompromised (Organ Transplant, cancer chemotherapy, Rheumatoid biologic meds such as Humera)  3. Poorly controlled diabetes (A1C &gt; 8.0, blood glucose over 200)  If you have one of these conditions, contact your surgeon for an antibiotic prescription, prior to your dental procedure.  Discharge Instructions     Call MD / Call 911   Complete by: As directed    If you experience chest pain or shortness of breath, CALL 911 and be transported to the hospital emergency room.  If you develope a fever above 101 F, pus (white drainage) or increased drainage or redness at the wound, or calf pain, call your surgeon's office.   Constipation Prevention   Complete by: As directed    Drink plenty of fluids.  Prune juice may be helpful.  You may use a stool softener, such as Colace (over the counter) 100 mg twice a day.  Use MiraLax (over the counter) for constipation as needed.   Diet - low sodium heart healthy   Complete by: As directed    Increase activity slowly as tolerated   Complete by: As directed    Post-operative opioid taper instructions:   Complete by: As directed  POST-OPERATIVE OPIOID TAPER INSTRUCTIONS: It is important to wean off of your opioid medication as soon as possible. If you do not need pain medication after your surgery it is ok to stop day one. Opioids include: Codeine, Hydrocodone(Norco, Vicodin), Oxycodone(Percocet, oxycontin) and hydromorphone amongst others.  Long term and even short term use of opiods can cause: Increased pain response Dependence Constipation Depression Respiratory  depression And more.  Withdrawal symptoms can include Flu like symptoms Nausea, vomiting And more Techniques to manage these symptoms Hydrate well Eat regular healthy meals Stay active Use relaxation techniques(deep breathing, meditating, yoga) Do Not substitute Alcohol to help with tapering If you have been on opioids for less than two weeks and do not have pain than it is ok to stop all together.  Plan to wean off of opioids This plan should start within one week post op of your joint replacement. Maintain the same interval or time between taking each dose and first decrease the dose.  Cut the total daily intake of opioids by one tablet each day Next start to increase the time between doses. The last dose that should be eliminated is the evening dose.          Allergies as of 12/12/2020       Reactions   Meperidine Hcl    Other reaction(s): sick nausea/vomiting   Codeine Nausea And Vomiting   Other reaction(s): sick/ nausea/vomiting   Demerol [meperidine] Nausea And Vomiting        Medication List     TAKE these medications    acetaminophen 650 MG CR tablet Commonly known as: TYLENOL Take 1,300 mg by mouth daily. Take additional  1,300 mg of needed in the evening   alendronate 70 MG tablet Commonly known as: FOSAMAX Take 70 mg by mouth once a week. Tuesday   amitriptyline 50 MG tablet Commonly known as: ELAVIL Take 1 tablet (50 mg total) by mouth at bedtime.   amLODipine 5 MG tablet Commonly known as: NORVASC Take 1 tablet (5 mg total) by mouth daily.   docusate sodium 100 MG capsule Commonly known as: COLACE Take 1 capsule (100 mg total) by mouth daily. What changed:  when to take this reasons to take this   Forteo 600 MCG/2.4ML Sopn Generic drug: Teriparatide (Recombinant) Inject 20 mcg into the skin daily. Discard after 28 days   furosemide 20 MG tablet Commonly known as: LASIX Take 1 tablet (20 mg total) by mouth daily as needed (as needed  for swelling).   gabapentin 100 MG capsule Commonly known as: NEURONTIN Take 1 capsule (100 mg total) by mouth 3 (three) times daily.   hydroxychloroquine 200 MG tablet Commonly known as: PLAQUENIL TAKE ONE TABLET BY MOUTH ONE TIME DAILY   hyoscyamine 0.375 MG 12 hr tablet Commonly known as: LEVBID Take 0.375 mg by mouth every 12 (twelve) hours as needed for cramping.   losartan 50 MG tablet Commonly known as: COZAAR Take 50 mg by mouth 2 (two) times daily.   nystatin cream Commonly known as: MYCOSTATIN Apply 1 application topically daily as needed.   omeprazole 40 MG capsule Commonly known as: PRILOSEC Take 1 capsule (40 mg total) by mouth daily.   ondansetron 4 MG tablet Commonly known as: ZOFRAN Take 4 mg by mouth every 8 (eight) hours as needed for nausea or vomiting.   Pen Needles 31G X 5 MM Misc Use to inject Forteo once daily. DO NOT RE-USE.   Unifine Pentips 31G X 5 MM Misc Generic  drug: Insulin Pen Needle USE TO INJECT FORTEO ONCE DAILY. DO NOT RE-USE.   polyethylene glycol 17 g packet Commonly known as: MIRALAX / GLYCOLAX Take 17 g by mouth daily. What changed:  when to take this reasons to take this   potassium chloride SA 20 MEQ tablet Commonly known as: KLOR-CON Take 1 tablet (20 mEq total) by mouth as needed (when you take Furosemide).   rosuvastatin 5 MG tablet Commonly known as: CRESTOR Take 5 mg by mouth daily.   traMADol 50 MG tablet Commonly known as: ULTRAM Take 1 tablet (50 mg total) by mouth every 6 (six) hours as needed for moderate pain.   Vitamin B-12 5000 MCG Tbdp Take 5,000 mcg by mouth daily. What changed:  how much to take when to take this additional instructions   Vitamin D 125 MCG (5000 UT) Caps Take 5,000 Units by mouth daily.   zolpidem 10 MG tablet Commonly known as: AMBIEN Take 10 mg by mouth at bedtime as needed for sleep.          Signed: Thea Gist 12/12/2020, 2:59 PM  [facility] is  now [facility] [address], [place], [place] [address] Phone: [phone] Facebook  Instagram  [employer]

## 2020-12-12 NOTE — Plan of Care (Signed)

## 2020-12-12 NOTE — Evaluation (Addendum)
Physical Therapy Evaluation Patient Details Name: Michele Bryan MRN: [id number] DOB: 08-02-1940 Today's Date: 12/12/2020  History of Present Illness  pt is a 80 y.o. female s/p Left reverse shoulder conversion to shoulder hemi-arthroplasty 10/11. HTN, RA, Lower extremity edema, MS, and arthritis.  Clinical Impression  Patient evaluated by Physical Therapy with no further acute PT needs identified. All education has been completed and the patient has no further questions.  Pt with overall unsteady gait as noted below; pt and husband educated on use of gait belt, recommend close supervision/assist initially for mobility, safety and fall prevention. Pt denies falls, feels her gait/balance is near baseline. Pt and husband state that they have had HHPT/OT and safety recommendations at home.  See below for any follow-up Physical Therapy or equipment needs. PT is signing off. Thank you for this referral.        Recommendations for follow up therapy are one component of a multi-disciplinary discharge planning process, led by the attending physician.  Recommendations may be updated based on patient status, additional functional criteria and insurance authorization.  Follow Up Recommendations   Assist/supervision for mobility   Equipment Recommendations     Has DME   Recommendations for Other Services       Precautions / Restrictions Precautions Precautions: Shoulder Type of Shoulder Precautions: Conservative Shoulder Interventions: Shoulder sling/immobilizer;At all times;Off for dressing/bathing/exercises Precaution Booklet Issued: Yes (comment) Precaution Comments: No AROM, PROM. Elbow, wrist, and hand exercises only. Required Braces or Orthoses: Sling Restrictions Weight Bearing Restrictions: Yes LUE Weight Bearing: Non weight bearing      Mobility  Bed Mobility Overal bed mobility: Needs Assistance Bed Mobility: Supine to Sit     Supine to sit: Supervision     General bed  mobility comments: pt able to sit EOB, did not attempt to use LUE to assist    Transfers Overall transfer level: Needs assistance   Transfers: Sit to/from Stand Sit to Stand: Min guard         General transfer comment: Min guard for safety, slight posterior lean and unsteadiness on feet.  Ambulation/Gait Ambulation/Gait assistance: Min guard;Min assist Gait Distance (Feet): 180 Feet Assistive device: None;1 person hand held assist Gait Pattern/deviations: Step-through pattern;Decreased stride length;Narrow base of support;Drifts right/left     General Gait Details: pt with overall unsteady gait min assist to min-guard for balance/safety with improvement  noted with incr distance; pt able to self recover with incr time/delayed reactions  Stairs            Wheelchair Mobility    Modified Rankin (Stroke Patients Only)       Balance Overall balance assessment: Needs assistance         Standing balance support: No upper extremity supported Standing balance-Leahy Scale: Fair Standing balance comment: with slight posterior lean and unsteadiness on feet, no LOBs.                             Pertinent Vitals/Pain Pain Assessment: No/denies pain Pain Score: 5     Home Living Family/patient expects to be discharged to:: Private residence Living Arrangements: Spouse/significant other Available Help at Discharge: Family;Available 24 hours/day Type of Home: House Home Access: Stairs to enter Entrance Stairs-Rails: None Entrance Stairs-Number of Steps: 1 Home Layout: Two level;Able to live on main level with bedroom/bathroom Home Equipment: Gilmer Mor - single point;Shower seat - built in;Grab bars - tub/shower;Grab bars - toilet;Cane - quad;Hand held Careers information officer -  2 wheels Additional Comments: garage has one grab bar to go with step.    Prior Function Level of Independence: Independent         Comments: Independent in ADLs/IADLs and driving.      Hand Dominance   Dominant Hand: Right    Extremity/Trunk Assessment   Upper Extremity Assessment Upper Extremity Assessment: Defer to OT evaluation LUE Deficits / Details: Pt with slight ulnar drift, greater in L hand. Reports arthritis in both hands. Able to make full fist.    Lower Extremity Assessment Lower Extremity Assessment: Overall WFL for tasks assessed    Cervical / Trunk Assessment Cervical / Trunk Assessment: Kyphotic  Communication   Communication: No difficulties  Cognition Arousal/Alertness: Awake/alert Behavior During Therapy: WFL for tasks assessed/performed Overall Cognitive Status: Within Functional Limits for tasks assessed Area of Impairment: Safety/judgement                         Safety/Judgement: Decreased awareness of safety;Decreased awareness of deficits     General Comments: pt with unsteady gait, attempted to pick up twist tie from floor; cautioned pt not too pick up objects as of now, have husband assist. pt very agreeable      General Comments      Exercises Shoulder Exercises Elbow Flexion: AROM;10 reps;Seated;Left Wrist Flexion: 10 reps;AROM;Left;Seated Wrist Extension: 10 reps;AROM;Left;Seated Digit Composite Flexion: 10 reps;AROM;Left;Seated Composite Extension: AROM;10 reps;Left;Seated Donning/doffing shirt without moving shoulder: Minimal assistance Method for sponge bathing under operated UE: Supervision/safety Donning/doffing sling/immobilizer: Minimal assistance Correct positioning of sling/immobilizer: Minimal assistance ROM for elbow, wrist and digits of operated UE: Supervision/safety Sling wearing schedule (on at all times/off for ADL's): Supervision/safety Proper positioning of operated UE when showering: Supervision/safety Positioning of UE while sleeping: Supervision/safety   Assessment/Plan    PT Assessment    PT Problem List         PT Treatment Interventions      PT Goals (Current goals can  be found in the Care Plan section)  Acute Rehab PT Goals Patient Stated Goal: To get back to independence PT Goal Formulation: All assessment and education complete, DC therapy    Frequency     Barriers to discharge        Co-evaluation               AM-PAC PT "6 Clicks" Mobility  Outcome Measure                  End of Session              Time: 0110-0349 PT Time Calculation (min) (ACUTE ONLY): 19 min   Charges:   PT Evaluation $PT Eval Low Complexity: 1 Low          Emanuela Runnion, PT  Acute Rehab Dept ([facility]) [phone] Pager [phone]  12/12/2020   [facility] 12/12/2020, 12:33 PM

## 2020-12-12 NOTE — Evaluation (Signed)
Occupational Therapy Evaluation Patient Details Name: Michele Bryan MRN: [id number] DOB: January 28, 1941 Today's Date: 12/12/2020   History of Present Illness pt is a 80 y.o. female s/p Left reverse shoulder conversion to shoulder hemi-arthroplasty 10/11. HTN, RA, Lower extremity edema, MS, and arthritis.   Clinical Impression   PTA, pt was living with spouse and reports independence in ADLs/IADLs. Pt s/p shoulder replacement without functional use of left non-dominant upper extremity secondary to effects of surgery and interscalene block and shoulder precautions. Therapist provided education and instruction to patient in regards to exercises, precautions, positioning, donning upper extremity clothing and bathing while maintaining shoulder precautions, ice and edema management and donning/doffing sling. Patient verbalized understanding and demonstrated as needed. Patient needed assistance to donn shirt and sling. Provided with instruction on compensatory strategies to perform ADLs. May need to follow-up with husband to educate on precautions to increase carryover. Patient to follow up with MD for further therapy needs.       Recommendations for follow up therapy are one component of a multi-disciplinary discharge planning process, led by the attending physician.  Recommendations may be updated based on patient status, additional functional criteria and insurance authorization.   Follow Up Recommendations  No OT follow up;Supervision/Assistance - 24 hour    Equipment Recommendations  None recommended by OT    Recommendations for Other Services       Precautions / Restrictions Precautions Precautions: Shoulder Type of Shoulder Precautions: Conservative Shoulder Interventions: Shoulder sling/immobilizer;At all times;Off for dressing/bathing/exercises Precaution Booklet Issued: Yes (comment) Precaution Comments: No AROM, PROM. Elbow, wrist, and hand exercises only. Required Braces or  Orthoses: Sling Restrictions Weight Bearing Restrictions: Yes LUE Weight Bearing: Non weight bearing      Mobility Bed Mobility               General bed mobility comments: In recliner upon arrival    Transfers Overall transfer level: Needs assistance   Transfers: Sit to/from Stand Sit to Stand: Min guard         General transfer comment: Min guard for safety, slight posterior lean and unsteadiness on feet.    Balance Overall balance assessment: Needs assistance         Standing balance support: No upper extremity supported Standing balance-Leahy Scale: Fair Standing balance comment: with slight posterior lean and unsteadiness on feet, no LOBs.                           ADL either performed or assessed with clinical judgement   ADL Overall ADL's : Needs assistance/impaired Eating/Feeding: Set up;Sitting   Grooming: Set up;Sitting   Upper Body Bathing: Sitting;Set up   Lower Body Bathing: Set up;Sitting/lateral leans   Upper Body Dressing : Sitting;Cueing for UE precautions;Cueing for compensatory techniques;Moderate assistance Upper Body Dressing Details (indicate cue type and reason): Needing assist for reaching R button up sleeve, buttoning, donning R shoudler sling. Lower Body Dressing: Min guard;Sit to/from stand Lower Body Dressing Details (indicate cue type and reason): For safety, required min cues to adhere to precautions. Toilet Transfer: Solicitor;Ambulation   Toileting- Clothing Manipulation and Hygiene: Min guard;Sit to/from stand       Functional mobility during ADLs: Min guard       Vision Patient Visual Report: No change from baseline       Perception     Praxis      Pertinent Vitals/Pain Pain Assessment: 0-10 Pain Score: 5  Hand Dominance Right   Extremity/Trunk Assessment Upper Extremity Assessment Upper Extremity Assessment: LUE deficits/detail LUE Deficits / Details: Pt with slight  ulnar drift, greater in L hand. Reports arthritis in both hands. Able to make full fist.   Lower Extremity Assessment Lower Extremity Assessment: Defer to PT evaluation   Cervical / Trunk Assessment Cervical / Trunk Assessment: Kyphotic   Communication Communication Communication: No difficulties   Cognition Arousal/Alertness: Awake/alert Behavior During Therapy: WFL for tasks assessed/performed Overall Cognitive Status: Within Functional Limits for tasks assessed                                 General Comments: Able to follow one-step commands, needing min verbal cues to adhere to shoulder precautions throughout. Questionable short-term memory deficits, may need to follow-up with husband to provide education.   General Comments       Exercises Exercises: Shoulder Shoulder Exercises Elbow Flexion: AROM;10 reps;Seated;Left Wrist Flexion: 10 reps;AROM;Left;Seated Wrist Extension: 10 reps;AROM;Left;Seated Digit Composite Flexion: 10 reps;AROM;Left;Seated Composite Extension: AROM;10 reps;Left;Seated   Shoulder Instructions Shoulder Instructions Donning/doffing shirt without moving shoulder: Minimal assistance Method for sponge bathing under operated UE: Supervision/safety Donning/doffing sling/immobilizer: Minimal assistance Correct positioning of sling/immobilizer: Minimal assistance ROM for elbow, wrist and digits of operated UE: Supervision/safety Sling wearing schedule (on at all times/off for ADL's): Supervision/safety Proper positioning of operated UE when showering: Supervision/safety Positioning of UE while sleeping: Supervision/safety    Home Living Family/patient expects to be discharged to:: Private residence Living Arrangements: Spouse/significant other Available Help at Discharge: Family;Available 24 hours/day Type of Home: House Home Access: Stairs to enter [employer] of Steps: 1 Entrance Stairs-Rails: None Home Layout: Two  level;Able to live on main level with bedroom/bathroom Alternate Level Stairs-Number of Steps: flight Alternate Level Stairs-Rails: Left;Right Bathroom Shower/Tub: Producer, television/film/video: Handicapped height     Home Equipment: Cane - single point;Shower seat - built in;Grab bars - tub/shower;Grab bars - toilet;Cane - quad;Hand held shower head;Walker - 2 wheels   Additional Comments: garage has one grab bar to go with step.      Prior Functioning/Environment Level of Independence: Independent        Comments: Independent in ADLs/IADLs and driving.        OT Problem List: Decreased strength;Decreased range of motion;Impaired balance (sitting and/or standing);Pain;Impaired UE functional use;Decreased knowledge of precautions      OT Treatment/Interventions: Self-care/ADL training;Therapeutic exercise;Therapeutic activities;Patient/family education    OT Goals(Current goals can be found in the care plan section) Acute Rehab OT Goals Patient Stated Goal: To get back to independence OT Goal Formulation: With patient Time For Goal Achievement: 12/26/20 Potential to Achieve Goals: Good  OT Frequency: Min 2X/week   Barriers to D/C:            Co-evaluation              AM-PAC OT "6 Clicks" Daily Activity     Outcome Measure Help from another person eating meals?: A Little Help from another person taking care of personal grooming?: A Little Help from another person toileting, which includes using toliet, bedpan, or urinal?: A Little Help from another person bathing (including washing, rinsing, drying)?: A Little Help from another person to put on and taking off regular upper body clothing?: A Lot Help from another person to put on and taking off regular lower body clothing?: A Little 6 Click Score: 17   End of  Session Equipment Utilized During Treatment: Other (comment) (shoudler sling) Nurse Communication: Mobility status  Activity Tolerance: Patient  tolerated treatment well Patient left: in chair;with call bell/phone within reach  OT Visit Diagnosis: Unsteadiness on feet (R26.81);Muscle weakness (generalized) (M62.81);Pain                Time: 0820-0903 OT Time Calculation (min): 43 min Charges:  OT General Charges $OT Visit: 1 Visit OT Evaluation $OT Eval Low Complexity: 1 Low OT Treatments $Self Care/Home Management : 23-37 mins  Prudence Davidson, OTS Acute Rehab Office: [phone]   Michele Bryan 12/12/2020, 9:32 AM

## 2020-12-13 ENCOUNTER — Encounter (HOSPITAL_COMMUNITY): Payer: Self-pay | Admitting: Orthopedic Surgery

## 2020-12-25 DIAGNOSIS — Z4789 Encounter for other orthopedic aftercare: Secondary | ICD-10-CM | POA: Diagnosis not present

## 2020-12-31 ENCOUNTER — Other Ambulatory Visit (HOSPITAL_COMMUNITY): Payer: Self-pay

## 2021-01-02 ENCOUNTER — Other Ambulatory Visit (HOSPITAL_COMMUNITY): Payer: Self-pay

## 2021-01-25 ENCOUNTER — Other Ambulatory Visit (HOSPITAL_COMMUNITY): Payer: Self-pay

## 2021-01-28 DIAGNOSIS — Z1389 Encounter for screening for other disorder: Secondary | ICD-10-CM | POA: Diagnosis not present

## 2021-01-28 DIAGNOSIS — Z0000 Encounter for general adult medical examination without abnormal findings: Secondary | ICD-10-CM | POA: Diagnosis not present

## 2021-02-01 ENCOUNTER — Other Ambulatory Visit: Payer: Self-pay | Admitting: Family Medicine

## 2021-02-01 DIAGNOSIS — Z1231 Encounter for screening mammogram for malignant neoplasm of breast: Secondary | ICD-10-CM

## 2021-02-01 DIAGNOSIS — M810 Age-related osteoporosis without current pathological fracture: Secondary | ICD-10-CM

## 2021-02-04 ENCOUNTER — Other Ambulatory Visit (HOSPITAL_COMMUNITY): Payer: Self-pay

## 2021-02-06 DIAGNOSIS — Z4789 Encounter for other orthopedic aftercare: Secondary | ICD-10-CM | POA: Diagnosis not present

## 2021-02-11 DIAGNOSIS — M810 Age-related osteoporosis without current pathological fracture: Secondary | ICD-10-CM | POA: Diagnosis not present

## 2021-02-11 DIAGNOSIS — E7800 Pure hypercholesterolemia, unspecified: Secondary | ICD-10-CM | POA: Diagnosis not present

## 2021-02-11 DIAGNOSIS — I10 Essential (primary) hypertension: Secondary | ICD-10-CM | POA: Diagnosis not present

## 2021-02-11 DIAGNOSIS — D649 Anemia, unspecified: Secondary | ICD-10-CM | POA: Diagnosis not present

## 2021-02-12 ENCOUNTER — Other Ambulatory Visit (HOSPITAL_COMMUNITY): Payer: Self-pay

## 2021-02-12 ENCOUNTER — Telehealth: Payer: Self-pay | Admitting: Pharmacist

## 2021-02-12 ENCOUNTER — Other Ambulatory Visit: Payer: Self-pay | Admitting: *Deleted

## 2021-02-12 DIAGNOSIS — Z79899 Other long term (current) drug therapy: Secondary | ICD-10-CM

## 2021-02-12 DIAGNOSIS — Z111 Encounter for screening for respiratory tuberculosis: Secondary | ICD-10-CM

## 2021-02-12 NOTE — Telephone Encounter (Signed)
Received notification from [facility] that patient's Forteo fill was cancelled. Called patient - she states she receives so many calls every day that she doesn't pick most up. She apologized for confusion. Emailed [facility] to requesting ot re-initiate fill for Forteo  She requested rx for [employer]. She has been taking Xeljanz 5 mg once daily without any side effects x 3 weeks. She will need repeat labs prior to refill being sent. She will stop by today after 1:30pm for repeat CBC and CMP -standing labs remain in place.  As long as labs wnl, will send rx for Harriette Ohara 5mg  once daily to [facility]  [facility], PharmD, MPH, BCPS Clinical Pharmacist (Rheumatology and Pulmonology)

## 2021-02-15 ENCOUNTER — Other Ambulatory Visit (HOSPITAL_COMMUNITY): Payer: Self-pay

## 2021-02-15 ENCOUNTER — Telehealth: Payer: Self-pay | Admitting: Pharmacy Technician

## 2021-02-15 NOTE — Telephone Encounter (Signed)
Received notification from [facility] regarding a prior authorization for FORTEO. Authorization has been APPROVED from 03/03/20 to 03/02/22.    Authorization # [id number]

## 2021-02-15 NOTE — Telephone Encounter (Signed)
Submitted a Prior Authorization request to [facility] for FORTEO via CoverMyMeds. Will update once we receive a response.   (KeyMarilu Favre) - [id number]

## 2021-02-18 ENCOUNTER — Other Ambulatory Visit: Payer: Self-pay | Admitting: *Deleted

## 2021-02-18 DIAGNOSIS — Z9225 Personal history of immunosupression therapy: Secondary | ICD-10-CM

## 2021-02-18 DIAGNOSIS — Z79899 Other long term (current) drug therapy: Secondary | ICD-10-CM

## 2021-02-18 DIAGNOSIS — Z111 Encounter for screening for respiratory tuberculosis: Secondary | ICD-10-CM | POA: Diagnosis not present

## 2021-02-19 ENCOUNTER — Telehealth: Payer: Self-pay | Admitting: *Deleted

## 2021-02-19 ENCOUNTER — Other Ambulatory Visit: Payer: Self-pay | Admitting: *Deleted

## 2021-02-19 DIAGNOSIS — Z9225 Personal history of immunosupression therapy: Secondary | ICD-10-CM

## 2021-02-19 DIAGNOSIS — M0579 Rheumatoid arthritis with rheumatoid factor of multiple sites without organ or systems involvement: Secondary | ICD-10-CM

## 2021-02-19 DIAGNOSIS — Z79899 Other long term (current) drug therapy: Secondary | ICD-10-CM

## 2021-02-19 DIAGNOSIS — D649 Anemia, unspecified: Secondary | ICD-10-CM

## 2021-02-19 NOTE — Telephone Encounter (Signed)
Labs received from:[facility] on:02/12/2021  Reviewed by:Sherron Ales, PA-C  Labs drawn:CMP, lipid panel  Results:WNL

## 2021-02-19 NOTE — Progress Notes (Signed)
The patient has been on xeljanz 5 mg 1 tablet daily for about 1 month.   She remains quite anemia. Unable to further reduce the dose.  Please let me know if you have any further recommendations.

## 2021-02-19 NOTE — Progress Notes (Signed)
Anemia is a stable.  Drop in hemoglobin noted after the surgery.  We should repeat CBC in 1 month.  Please refer patient to hematology for the evaluation and treatment of anemia.

## 2021-02-20 ENCOUNTER — Telehealth: Payer: Self-pay | Admitting: Hematology

## 2021-02-20 NOTE — Telephone Encounter (Signed)
Scheduled appt per 12/20 referral. Pt's husband is aware of appt date and time. He is aware pt needs to arrive 15 mins early to appt and she needs to bring an updated insurance card with her.

## 2021-02-21 LAB — CBC WITH DIFFERENTIAL/PLATELET
Absolute Monocytes: 561 cells/uL (ref 200–950)
Basophils Absolute: 50 cells/uL (ref 0–200)
Basophils Relative: 0.9 %
Eosinophils Absolute: 28 cells/uL (ref 15–500)
Eosinophils Relative: 0.5 %
HCT: 30.8 % — ABNORMAL LOW (ref 35.0–45.0)
Hemoglobin: 9.6 g/dL — ABNORMAL LOW (ref 11.7–15.5)
Lymphs Abs: 2343 cells/uL (ref 850–3900)
MCH: 26.9 pg — ABNORMAL LOW (ref 27.0–33.0)
MCHC: 31.2 g/dL — ABNORMAL LOW (ref 32.0–36.0)
MCV: 86.3 fL (ref 80.0–100.0)
MPV: 10.4 fL (ref 7.5–12.5)
Monocytes Relative: 10.2 %
Neutro Abs: 2519 cells/uL (ref 1500–7800)
Neutrophils Relative %: 45.8 %
Platelets: 231 10*3/uL (ref 140–400)
RBC: 3.57 10*6/uL — ABNORMAL LOW (ref 3.80–5.10)
RDW: 13.4 % (ref 11.0–15.0)
Total Lymphocyte: 42.6 %
WBC: 5.5 10*3/uL (ref 3.8–10.8)

## 2021-02-21 LAB — QUANTIFERON-TB GOLD PLUS
Mitogen-NIL: >10 IU/mL
NIL: 0.06 IU/mL
QuantiFERON-TB Gold Plus: NEGATIVE
TB1-NIL: 0.01 IU/mL
TB2-NIL: <0 IU/mL

## 2021-02-28 NOTE — Progress Notes (Deleted)
Office Visit Note  Patient: Michele Bryan             Date of Birth: 1940-12-17           MRN: [id number]             PCP: Sigmund Hazel, MD Referring: Sigmund Hazel, MD Visit Date: 03/14/2021 Occupation: @GUAROCC @  Subjective:  No chief complaint on file.   History of Present Illness: Michele Bryan is a 80 y.o. female ***   Activities of Daily Living:  Patient reports morning stiffness for *** {minute/hour:19697}.   Patient {ACTIONS;DENIES/REPORTS:21021675::"Denies"} nocturnal pain.  Difficulty dressing/grooming: {ACTIONS;DENIES/REPORTS:21021675::"Denies"} Difficulty climbing stairs: {ACTIONS;DENIES/REPORTS:21021675::"Denies"} Difficulty getting out of chair: {ACTIONS;DENIES/REPORTS:21021675::"Denies"} Difficulty using hands for taps, buttons, cutlery, and/or writing: {ACTIONS;DENIES/REPORTS:21021675::"Denies"}  No Rheumatology ROS completed.   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  05/06/2016   High risk medication use 05/06/2016   Primary osteoarthritis of both hands 05/06/2016   Primary osteoarthritis of left knee 05/06/2016   Primary osteoarthritis of both feet 05/06/2016   Dyslipidemia 05/06/2016   Peripheral neuropathy 05/06/2016   Diverticulosis of intestine without bleeding 05/06/2016   Osteopenia  05/06/2016   Vitamin D deficiency 05/06/2016   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   History of GI bleed 05/06/2016   Cataract of both eyes 05/06/2016   Urinary urgency 04/30/2015   Chronic constipation 04/30/2015   MS (multiple sclerosis) (HCC) 03/23/2013   Expected blood loss anemia 06/16/2012   S/P right TKA 06/15/2012    Past Medical History:  Diagnosis Date   Anemia    Arthritis    GERD (gastroesophageal reflux disease)    History of blood transfusion    Hyperlipidemia    Hypertension    Lower extremity edema    MS (multiple sclerosis) (HCC)    Neuromuscular disorder (HCC)    multiple scleroosis/peripheral neuropathy   PONV (postoperative nausea and vomiting)    Rheumatoid arthritis (HCC)    Ulcer    Vitamin D deficiency     Family History  Problem Relation Age of Onset   Heart attack Father    Breast cancer Neg Hx    Past Surgical History:  Procedure Laterality Date   APPLICATION OF INTRAOPERATIVE CT SCAN N/A 02/16/2020   Procedure: APPLICATION OF INTRAOPERATIVE CT SCAN;  Surgeon: Jadene Pierini, MD;  [facility];  Service: Neurosurgery;  Laterality: N/A;   AUGMENTATION MAMMAPLASTY     BACK SURGERY     breast augumentation     BREAST SURGERY     biopsy   COLONOSCOPY     DILATION AND CURETTAGE OF UTERUS     EYE SURGERY     both eys,cataracts   goiter     HALO APPLICATION N/A 02/16/2020   Procedure: HALO TRACTION APPLICATION;  Surgeon: Jadene Pierini, MD;  Location: [facility];  Service: Neurosurgery;  Laterality: N/A;   JOINT REPLACEMENT  KNEE ARTHROSCOPY Right    ORIF SHOULDER FRACTURE Left 03/05/2018   Procedure: OPEN REDUCTION INTERNAL FIXATION (ORIF) LEFT SCAPULA;  Surgeon: Netta Cedars, MD;  Location: [place];  Service: Orthopedics;  Laterality: Left;   POSTERIOR CERVICAL FUSION/FORAMINOTOMY N/A 12/06/2019   Procedure: Cervical one-two Posterior instrumented fusion;  Surgeon: Judith Part, MD;  Location: [place];  Service: Neurosurgery;  Laterality: N/A;   POSTERIOR CERVICAL FUSION/FORAMINOTOMY N/A  02/13/2020   Procedure: Revision of cervical instrumented fusion with Occiput to Cervical Four posterior instrumented fusion;  Surgeon: Judith Part, MD;  Location: [place];  Service: Neurosurgery;  Laterality: N/A;  posterior   POSTERIOR CERVICAL FUSION/FORAMINOTOMY N/A 02/16/2020   Procedure: Occiput to Thoracic Two Posterior Cervical Fusion with AIRO and Application of Halo;  Surgeon: Judith Part, MD;  Location: [place];  Service: Neurosurgery;  Laterality: N/A;   REVERSE SHOULDER ARTHROPLASTY Left 05/15/2017   Procedure: LEFT REVERSE SHOULDER ARTHROPLASTY;  Surgeon: Netta Cedars, MD;  Location: [place];  Service: Orthopedics;  Laterality: Left;   REVERSE SHOULDER ARTHROPLASTY Left 10/11/2018   Procedure: left shoulder irrigation and debridement, open poly exchange and removal of painful hardware;  Surgeon: Netta Cedars, MD;  Location[facility];  Service: Orthopedics;  Laterality: Left;   SHOULDER HEMI-ARTHROPLASTY Left 12/11/2020   Procedure: Left reverse shoulder conversion to SHOULDER HEMI-ARTHROPLASTY;  Surgeon: Netta Cedars, MD;  [facility];  Service: Orthopedics;  Laterality: Left;  with ISB   THYROID LOBECTOMY     TOTAL KNEE ARTHROPLASTY Right 06/15/2012   Procedure: RIGHT TOTAL KNEE ARTHROPLASTY;  Surgeon: Mauri Pole, MD;  [facility];  Service: Orthopedics;  Laterality: Right;   WRIST SURGERY Left    Social History   Social History Narrative   Patient lives at home with her husband [name]).   Retired.   Education- college.   Caffeine- Two cups of decaf tea daily.   Right handed.         Immunization History  Administered Date(s) Administered   Influenza, High Dose Seasonal PF 12/03/2013   Influenza,inj,quad, With Preservative 11/23/2014   Influenza-Unspecified 12/19/2017   PFIZER(Purple Top)SARS-COV-2 Vaccination 04/16/2019, 05/29/2019, 01/09/2020   Td 12/15/2017   Zoster Recombinat (Shingrix) 08/10/2017, 01/05/2018     Objective: Vital Signs:  There were no vitals taken for this visit.   Physical Exam   Musculoskeletal Exam: ***  CDAI Exam: CDAI Score: -- Patient Global: --; Provider Global: -- Swollen: --; Tender: -- Joint Exam 03/14/2021   No joint exam has been documented for this visit   There is currently no information documented on the homunculus. Go to the Rheumatology activity and complete the homunculus joint exam.  Investigation: No additional findings.  Imaging: No results found.  Recent Labs: Lab Results  Component Value Date   WBC 5.5 02/18/2021   HGB 9.6 (L) 02/18/2021   PLT 231 02/18/2021   NA 138 12/12/2020   K 4.1 12/12/2020   CL 105 12/12/2020   CO2 23 12/12/2020   GLUCOSE 153 (H) 12/12/2020   BUN 20 12/12/2020   CREATININE 0.67 12/12/2020   BILITOT 0.7 12/06/2020   ALKPHOS 124 08/17/2020   AST 16 12/06/2020   ALT 11 12/06/2020   PROT 6.2 12/06/2020   ALBUMIN 3.7 08/17/2020   CALCIUM 8.7 (L) 12/12/2020   GFRAA 98 04/03/2020   QFTBGOLDPLUS NEGATIVE 02/18/2021    Speciality Comments: PLQ Eye Exam:12/06/2020 WNL@ [facility] Osteoporosis managed by PCP-ACY 10/12/2018  Procedures:  No procedures performed Allergies: Meperidine hcl,  Codeine, and Demerol [meperidine]   Assessment / Plan:     Visit Diagnoses: No diagnosis found.  Orders: No orders of the defined types were placed in this encounter.  No orders of the defined types were placed in this encounter.   Face-to-face time spent with patient was *** minutes. Greater than 50% of time was spent in counseling and coordination of care.  Follow-Up Instructions: No follow-ups on file.   Ellen Henri, CMA  Note - This record has been created using Animal nutritionist.  Chart creation errors have been sought, but may not always  have been located. Such creation errors do not reflect on  the standard of medical care.

## 2021-03-05 ENCOUNTER — Other Ambulatory Visit (HOSPITAL_COMMUNITY): Payer: Self-pay

## 2021-03-06 ENCOUNTER — Other Ambulatory Visit: Payer: Self-pay

## 2021-03-06 ENCOUNTER — Inpatient Hospital Stay: Payer: Medicare PPO | Attending: Hematology | Admitting: Hematology

## 2021-03-06 ENCOUNTER — Inpatient Hospital Stay: Payer: Medicare PPO

## 2021-03-06 VITALS — BP 130/64 | HR 79 | Temp 98.6°F | Resp 18 | Wt 116.4 lb

## 2021-03-06 DIAGNOSIS — G35 Multiple sclerosis: Secondary | ICD-10-CM | POA: Insufficient documentation

## 2021-03-06 DIAGNOSIS — D649 Anemia, unspecified: Secondary | ICD-10-CM

## 2021-03-06 LAB — CBC WITH DIFFERENTIAL/PLATELET
Abs Immature Granulocytes: 0.02 10*3/uL (ref 0.00–0.07)
Basophils Absolute: 0.1 10*3/uL (ref 0.0–0.1)
Basophils Relative: 1 %
Eosinophils Absolute: 0.1 10*3/uL (ref 0.0–0.5)
Eosinophils Relative: 2 %
HCT: 33.4 % — ABNORMAL LOW (ref 36.0–46.0)
Hemoglobin: 10.3 g/dL — ABNORMAL LOW (ref 12.0–15.0)
Immature Granulocytes: 0 %
Lymphocytes Relative: 33 %
Lymphs Abs: 2 10*3/uL (ref 0.7–4.0)
MCH: 26.9 pg (ref 26.0–34.0)
MCHC: 30.8 g/dL (ref 30.0–36.0)
MCV: 87.2 fL (ref 80.0–100.0)
Monocytes Absolute: 0.6 10*3/uL (ref 0.1–1.0)
Monocytes Relative: 11 %
Neutro Abs: 3.1 10*3/uL (ref 1.7–7.7)
Neutrophils Relative %: 53 %
Platelets: 218 10*3/uL (ref 150–400)
RBC: 3.83 MIL/uL — ABNORMAL LOW (ref 3.87–5.11)
RDW: 15.5 % (ref 11.5–15.5)
WBC: 5.9 10*3/uL (ref 4.0–10.5)
nRBC: 0 % (ref 0.0–0.2)

## 2021-03-06 LAB — CMP (CANCER CENTER ONLY)
ALT: 11 U/L (ref 0–44)
AST: 16 U/L (ref 15–41)
Albumin: 4.3 g/dL (ref 3.5–5.0)
Alkaline Phosphatase: 100 U/L (ref 38–126)
Anion gap: 8 (ref 5–15)
BUN: 19 mg/dL (ref 8–23)
CO2: 25 mmol/L (ref 22–32)
Calcium: 9.3 mg/dL (ref 8.9–10.3)
Chloride: 105 mmol/L (ref 98–111)
Creatinine: 0.81 mg/dL (ref 0.44–1.00)
GFR, Estimated: >60 mL/min (ref >60)
Glucose, Bld: 78 mg/dL (ref 70–99)
Potassium: 4.5 mmol/L (ref 3.5–5.1)
Sodium: 138 mmol/L (ref 135–145)
Total Bilirubin: 0.9 mg/dL (ref 0.3–1.2)
Total Protein: 7.1 g/dL (ref 6.5–8.1)

## 2021-03-06 LAB — RETICULOCYTES
Immature Retic Fract: 14 % (ref 2.3–15.9)
RBC.: 3.81 MIL/uL — ABNORMAL LOW (ref 3.87–5.11)
Retic Count, Absolute: 48.4 10*3/uL (ref 19.0–186.0)
Retic Ct Pct: 1.3 % (ref 0.4–3.1)

## 2021-03-06 LAB — VITAMIN B12: Vitamin B-12: 1475 pg/mL — ABNORMAL HIGH (ref 180–914)

## 2021-03-06 LAB — SEDIMENTATION RATE: Sed Rate: 6 mm/hr (ref 0–22)

## 2021-03-06 LAB — IRON AND IRON BINDING CAPACITY (CC-WL,HP ONLY)
Iron: 42 ug/dL (ref 28–170)
Saturation Ratios: 9 % — ABNORMAL LOW (ref 10.4–31.8)
TIBC: 487 ug/dL — ABNORMAL HIGH (ref 250–450)
UIBC: 445 ug/dL — ABNORMAL HIGH (ref 148–442)

## 2021-03-06 LAB — TSH: TSH: 1.1 u[IU]/mL (ref 0.308–3.960)

## 2021-03-06 LAB — FERRITIN: Ferritin: 19 ng/mL (ref 11–307)

## 2021-03-06 NOTE — Progress Notes (Addendum)
[facility]   HEMATOLOGY/ONCOLOGY CONSULTATION NOTE  Date of Service: 03/06/2021  Patient Care Team: Kathyrn Lass, MD as PCP - General (Family Medicine)  CHIEF COMPLAINTS/PURPOSE OF CONSULTATION:  Anemia  HISTORY OF PRESENTING ILLNESS:  Michele Bryan is a wonderful 81 y.o. female who has been referred to [place] by Dr .Kathyrn Lass, MD / Dr Bo Merino MD for evaluation and management of Anemia.  Patient has a history of hypertension, dyslipidemia, peripheral neuropathy, rheumatoid arthritis on Plaquenil, osteoporosis history of multiple surgeries including multiple orthopedic surgeries as noted below. Most recently she had a left shoulder surgery with Dr. Veverly Fells on 12/11/2020.  Review of her available labs show her hemoglobin was 10.5 on 12/06/2020 prior to her left shoulder surgery with an MCV of 89 and normal WBC count and platelets. CBC done with her rheumatologist Dr. Estanislado Pandy on 02/18/2021 showed a hemoglobin of 9.6 with an MCV of 86 WBC count of 5.5k platelet count of 231k. I do not see any additional work-up having been done for the anemia.  Patient was referred to [place] for further evaluation and management. Patient notes some fatigue and some generalized weakness but no presyncopal or syncopal symptoms. No acute chest pain or shortness of breath. Multiple joint pains and back pain related to her rheumatoid arthritis and surgeries.  She is accompanied for this clinic visit by her husband. She endorses no acute weight loss or other constitutional symptoms such as fevers chills night sweats.  MEDICAL HISTORY:  Past Medical History:  Diagnosis Date   Anemia    Arthritis    GERD (gastroesophageal reflux disease)    History of blood transfusion    Hyperlipidemia    Hypertension    Lower extremity edema    MS (multiple sclerosis) (HCC)    Neuromuscular disorder (HCC)    multiple scleroosis/peripheral neuropathy   PONV (postoperative nausea and vomiting)    Rheumatoid arthritis ([place])     Ulcer    Vitamin D deficiency     SURGICAL HISTORY: Past Surgical History:  Procedure Laterality Date   APPLICATION OF INTRAOPERATIVE CT SCAN N/A 02/16/2020   Procedure: APPLICATION OF INTRAOPERATIVE CT SCAN;  Surgeon: Judith Part, MD;  Location: [place];  Service: Neurosurgery;  Laterality: N/A;   AUGMENTATION MAMMAPLASTY     BACK SURGERY     breast augumentation     BREAST SURGERY     biopsy   COLONOSCOPY     DILATION AND CURETTAGE OF UTERUS     EYE SURGERY     both eys,cataracts   goiter     HALO APPLICATION N/A 57/50/5183   Procedure: HALO TRACTION APPLICATION;  Surgeon: Judith Part, MD;  Location: [place];  Service: Neurosurgery;  Laterality: N/A;   JOINT REPLACEMENT     KNEE ARTHROSCOPY Right    ORIF SHOULDER FRACTURE Left 03/05/2018   Procedure: OPEN REDUCTION INTERNAL FIXATION (ORIF) LEFT SCAPULA;  Surgeon: Netta Cedars, MD;  Location: [place];  Service: Orthopedics;  Laterality: Left;   POSTERIOR CERVICAL FUSION/FORAMINOTOMY N/A 12/06/2019   Procedure: Cervical one-two Posterior instrumented fusion;  Surgeon: Judith Part, MD;  Location: [place];  Service: Neurosurgery;  Laterality: N/A;   POSTERIOR CERVICAL FUSION/FORAMINOTOMY N/A 02/13/2020   Procedure: Revision of cervical instrumented fusion with Occiput to Cervical Four posterior instrumented fusion;  Surgeon: Judith Part, MD;  Location: [place];  Service: Neurosurgery;  Laterality: N/A;  posterior   POSTERIOR CERVICAL FUSION/FORAMINOTOMY N/A 02/16/2020   Procedure: Occiput to Thoracic Two Posterior Cervical  Fusion with AIRO and Application of Halo;  Surgeon: Judith Part, MD;  Location: [place];  Service: Neurosurgery;  Laterality: N/A;   REVERSE SHOULDER ARTHROPLASTY Left 05/15/2017   Procedure: LEFT REVERSE SHOULDER ARTHROPLASTY;  Surgeon: Netta Cedars, MD;  Location: [place];  Service: Orthopedics;  Laterality: Left;   REVERSE SHOULDER ARTHROPLASTY Left 10/11/2018   Procedure: left shoulder  irrigation and debridement, open poly exchange and removal of painful hardware;  Surgeon: Netta Cedars, MD;  [facility];  Service: Orthopedics;  Laterality: Left;   SHOULDER HEMI-ARTHROPLASTY Left 12/11/2020   Procedure: Left reverse shoulder conversion to SHOULDER HEMI-ARTHROPLASTY;  Surgeon: Netta Cedars, MD;  [facility];  Service: Orthopedics;  Laterality: Left;  with ISB   THYROID LOBECTOMY     TOTAL KNEE ARTHROPLASTY Right 06/15/2012   Procedure: RIGHT TOTAL KNEE ARTHROPLASTY;  Surgeon: Mauri Pole, MD;  [facility];  Service: Orthopedics;  Laterality: Right;   WRIST SURGERY Left     SOCIAL HISTORY: Social History   Socioeconomic History   Marital status: Married    Spouse name: [name]   Number of children: 3   Years of education: college   Highest education level: Not on file  Occupational History    Comment: retired  Tobacco Use   Smoking status: Never   Smokeless tobacco: Never  Vaping Use   Vaping Use: Never used  Substance and Sexual Activity   Alcohol use: No   Drug use: Never   Sexual activity: Not on file  Other Topics Concern   Not on file  Social History Narrative   Patient lives at home with her husband [name]).   Retired.   Education- college.   Caffeine- Two cups of decaf tea daily.   Right handed.         Social Determinants of Health   Financial Resource Strain: Not on file  Food Insecurity: Not on file  Transportation Needs: Not on file  Physical Activity: Not on file  Stress: Not on file  Social Connections: Not on file  Intimate Partner Violence: Not on file    FAMILY HISTORY: Family History  Problem Relation Age of Onset   Heart attack Father    Breast cancer Neg Hx     ALLERGIES:  is allergic to meperidine hcl, codeine, and demerol [meperidine].  MEDICATIONS:  Current Outpatient Medications  Medication Sig Dispense Refill   acetaminophen (TYLENOL) 650 MG CR tablet Take 1,300 mg by mouth daily. Take additional   1,300 mg of needed in the evening     alendronate (FOSAMAX) 70 MG tablet Take 70 mg by mouth once a week. Tuesday     amitriptyline (ELAVIL) 50 MG tablet Take 1 tablet (50 mg total) by mouth at bedtime. (Patient not taking: No sig reported) 30 tablet 1   amLODipine (NORVASC) 5 MG tablet Take 1 tablet (5 mg total) by mouth daily. 30 tablet 0   Cholecalciferol (VITAMIN D) 125 MCG (5000 UT) CAPS Take 5,000 Units by mouth daily. 30 capsule 0   Cyanocobalamin (VITAMIN B-12) 5000 MCG TBDP Take 5,000 mcg by mouth daily. (Patient taking differently: Take by mouth 3 (three) times a week. Energy) 30 tablet 0   docusate sodium (COLACE) 100 MG capsule Take 1 capsule (100 mg total) by mouth daily. (Patient taking differently: Take 100 mg by mouth daily as needed for mild constipation or moderate constipation.) 10 capsule 0   furosemide (LASIX) 20 MG tablet Take 1 tablet (20 mg total) by  mouth daily as needed (as needed for swelling). 15 tablet 3   gabapentin (NEURONTIN) 100 MG capsule Take 1 capsule (100 mg total) by mouth 3 (three) times daily. 90 capsule 11   hydroxychloroquine (PLAQUENIL) 200 MG tablet TAKE ONE TABLET BY MOUTH ONE TIME DAILY 90 tablet 0   hyoscyamine (LEVBID) 0.375 MG 12 hr tablet Take 0.375 mg by mouth every 12 (twelve) hours as needed for cramping.     Insulin Pen Needle (PEN NEEDLES) 31G X 5 MM MISC Use to inject Forteo once daily. DO NOT RE-USE. 100 each 3   Insulin Pen Needle 31G X 5 MM MISC USE TO INJECT FORTEO ONCE DAILY. DO NOT RE-USE. 100 each 3   losartan (COZAAR) 50 MG tablet Take 50 mg by mouth 2 (two) times daily.     nystatin cream (MYCOSTATIN) Apply 1 application topically daily as needed.     omeprazole (PRILOSEC) 40 MG capsule Take 1 capsule (40 mg total) by mouth daily. 30 capsule 0   ondansetron (ZOFRAN) 4 MG tablet Take 4 mg by mouth every 8 (eight) hours as needed for nausea or vomiting.     polyethylene glycol (MIRALAX / GLYCOLAX) 17 g packet Take 17 g by mouth daily.  (Patient taking differently: Take 17 g by mouth daily as needed.) 14 each 0   potassium chloride SA (KLOR-CON) 20 MEQ tablet Take 1 tablet (20 mEq total) by mouth as needed (when you take Furosemide). 15 tablet 3   rosuvastatin (CRESTOR) 5 MG tablet Take 5 mg by mouth daily.     Teriparatide, Recombinant, (FORTEO) 600 MCG/2.4ML SOPN Inject 20 mcg into the skin daily. Discard after 28 days 2.4 mL 5   traMADol (ULTRAM) 50 MG tablet Take 1 tablet (50 mg total) by mouth every 6 (six) hours as needed for moderate pain. 30 tablet 0   zolpidem (AMBIEN) 10 MG tablet Take 10 mg by mouth at bedtime as needed for sleep.     No current facility-administered medications for this visit.    REVIEW OF SYSTEMS:    10 Point review of Systems was done is negative except as noted above.  PHYSICAL EXAMINATION: ECOG PERFORMANCE STATUS: 2 - Symptomatic, <50% confined to bed  . Vitals:   03/06/21 1130  BP: 130/64  Pulse: 79  Resp: 18  Temp: 98.6 F (37 C)  SpO2: 98%   Filed Weights   03/06/21 1130  Weight: 116 lb 6.4 oz (52.8 kg)   .Body mass index is 20.62 kg/m.  GENERAL:alert, in no acute distress and comfortable SKIN: no acute rashes, no significant lesions EYES: conjunctiva are pink and non-injected, sclera anicteric OROPHARYNX: MMM, no exudates, no oropharyngeal erythema or ulceration NECK: supple, no JVD LYMPH:  no palpable lymphadenopathy in the cervical, axillary or inguinal regions LUNGS: clear to auscultation b/l with normal respiratory effort HEART: regular rate & rhythm ABDOMEN:  normoactive bowel sounds , non tender, not distended. Extremity: no pedal edema PSYCH: alert & oriented x 3 with fluent speech NEURO: no focal motor/sensory deficits  LABORATORY DATA:  I have reviewed the data as listed  . CBC Latest Ref Rng & Units 02/18/2021 12/12/2020 12/06/2020  WBC 3.8 - 10.8 Thousand/uL 5.5 - 6.6  Hemoglobin 11.7 - 15.5 g/dL 9.6(L) 9.5(L) 10.5(L)  Hematocrit 35.0 - 45.0 %  30.8(L) 29.6(L) 33.1(L)  Platelets 140 - 400 Thousand/uL 231 - 184    . CMP Latest Ref Rng & Units 12/12/2020 12/06/2020 10/29/2020  Glucose 70 - 99 mg/dL 153(H) 108(H)  78  BUN 8 - 23 mg/dL _0 Creatinine 0.44 - 1.00 mg/dL 0.67 0.63 0.78  Sodium 135 - 145 mmol/L 138 139 135  Potassium 3.5 - 5.1 mmol/L 4.1 4.1 5.0  Chloride 98 - 111 mmol/L 105 108 101  CO2 22 - 32 mmol/L _1 Calcium 8.9 - 10.3 mg/dL 8.7(L) 9.8 9.6  Total Protein 6.1 - 8.1 g/dL - 6.2 6.3  Total Bilirubin 0.2 - 1.2 mg/dL - 0.7 0.8  Alkaline Phos 38 - 126 U/L - - -  AST 10 - 35 U/L - 16 19  ALT 6 - 29 U/L - 11 12     RADIOGRAPHIC STUDIES: I have personally reviewed the radiological images as listed and agreed with the findings in the report. No results found.  ASSESSMENT & PLAN:   81 year old female with history of hypertension, dyslipidemia, rheumatoid arthritis on Plaquenil with  1) Mild normocytic anemia Her baseline hemoglobin appears to be in the 10-11 range over the last year. Hemoglobin was 10.5 on 12/06/2020 prior to her left shoulder surgery and dropped to 9.6 likely due to blood loss with surgery. PLAN -Available lab results discussed with the patient in details. 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 -In addition with her multiple recurrent surgeries and chronic PPI use she is at risk for iron deficiency as well and we shall check this out. -Other work-up to rule out nutritional deficiencies and paraproteinemias have been ordered as well -We will check a myeloma panel and TSH -Other work-up as per orders noted below.  Follow-up  Labs today Phone visit with Dr Irene Limbo in 1 week Orders Placed This Encounter  Procedures   CBC with Differential/Platelet    Standing Status:   Future    Number of Occurrences:   1    Standing Expiration Date:   03/06/2022   CMP ([place] only)    Standing  Status:   Future    Number of Occurrences:   1    Standing Expiration Date:   03/06/2022   Ferritin    Standing Status:   Future    Number of Occurrences:   1    Standing Expiration Date:   03/06/2022   Iron and TIBC    Standing Status:   Future    Standing Expiration Date:   03/06/2022   Vitamin B12    Standing Status:   Future    Number of Occurrences:   1    Standing Expiration Date:   03/06/2022   Copper, serum    Standing Status:   Future    Number of Occurrences:   1    Standing Expiration Date:   03/06/2022   Multiple Myeloma Panel (SPEP&IFE w/QIG)    Standing Status:   Future    Number of Occurrences:   1    Standing Expiration Date:   03/06/2022   Sedimentation rate    Standing Status:   Future    Number of Occurrences:   1    Standing Expiration Date:   03/06/2022   TSH    Standing Status:   Future    Number of Occurrences:   1    Standing Expiration Date:   03/06/2022   Folate RBC    Standing Status:   Future    Number of Occurrences:   1    Standing Expiration Date:   03/06/2022  Reticulocytes    Standing Status:   Future    Number of Occurrences:   1    Standing Expiration Date:   03/06/2022     All of the patients questions were answered with apparent satisfaction. The patient knows to call the clinic with any problems, questions or concerns.  I spent  30 mins counseling the patient face to face. The total time spent in the appointment was 45 mins including review of extensive medical records, review of labs with the patient, confirmation of all aspects of her HPI discussion of lab work-up and potential differential diagnosis and documentation.    Sullivan Lone MD [place] AAHIVMS [facility] Hematology/Oncology Physician [facility]  (Office):       [phone] (Work cell):  [phone] (Fax):           [phone]  03/06/2021 11:42 AM

## 2021-03-07 LAB — FOLATE RBC
Folate, Hemolysate: 389 ng/mL
Folate, RBC: 1235 ng/mL (ref >498)
Hematocrit: 31.5 % — ABNORMAL LOW (ref 34.0–46.6)

## 2021-03-09 LAB — COPPER, SERUM: Copper: 123 ug/dL (ref 80–158)

## 2021-03-11 LAB — MULTIPLE MYELOMA PANEL, SERUM
Albumin SerPl Elph-Mcnc: 3.6 g/dL (ref 2.9–4.4)
Albumin/Glob SerPl: 1.4 (ref 0.7–1.7)
Alpha 1: 0.2 g/dL (ref 0.0–0.4)
Alpha2 Glob SerPl Elph-Mcnc: 0.6 g/dL (ref 0.4–1.0)
B-Globulin SerPl Elph-Mcnc: 0.9 g/dL (ref 0.7–1.3)
Gamma Glob SerPl Elph-Mcnc: 0.9 g/dL (ref 0.4–1.8)
Globulin, Total: 2.7 g/dL (ref 2.2–3.9)
IgA: 82 mg/dL (ref 64–422)
IgG (Immunoglobin G), Serum: 921 mg/dL (ref 586–1602)
IgM (Immunoglobulin M), Srm: 74 mg/dL (ref 26–217)
Total Protein ELP: 6.3 g/dL (ref 6.0–8.5)

## 2021-03-12 NOTE — Progress Notes (Signed)
Office Visit Note  Patient: Michele Bryan             Date of Birth: 1941/02/15           MRN: [id number]             PCP: Kathyrn Lass, MD Referring: Kathyrn Lass, MD Visit Date: 03/19/2021 Occupation: @GUAROCC @  Subjective:  Pain and swelling in hands   History of Present Illness: Michele Bryan is a 81 y.o. female with history of seropositive rheumatoid arthritis and osteoarthritis.  She states she has been having increased pain and swelling in her bilateral hands.  She ran out of Morrie Sheldon a month ago.  She states over all she is doing better on Xeljanz without any side effects.  She has been still taking 1 tablet a day.  Besides her hands and other joints are painful.  Activities of Daily Living:  Patient reports morning stiffness for several hours.   Patient Reports nocturnal pain.  Difficulty dressing/grooming: Denies Difficulty climbing stairs: Reports Difficulty getting out of chair: Reports Difficulty using hands for taps, buttons, cutlery, and/or writing: Reports  Review of Systems  Constitutional:  Negative for fatigue.  HENT:  Negative for mouth sores, mouth dryness and nose dryness.   Eyes:  Negative for pain, itching and dryness.  Respiratory:  Negative for shortness of breath and difficulty breathing.   Cardiovascular:  Negative for chest pain and palpitations.  Gastrointestinal:  Negative for blood in stool, constipation and diarrhea.  Endocrine: Negative for increased urination.  Genitourinary:  Negative for difficulty urinating.  Musculoskeletal:  Positive for joint pain, joint pain, joint swelling and morning stiffness. Negative for myalgias, muscle tenderness and myalgias.  Skin:  Negative for color change, rash and redness.  Allergic/Immunologic: Negative for susceptible to infections.  Neurological:  Positive for numbness. Negative for dizziness, headaches, memory loss and weakness.  Hematological:  Positive for bruising/bleeding tendency.   Psychiatric/Behavioral:  Negative for confusion.    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   Gait abnormality 03/08/2020   Postoperative pain    Benign essential HTN    Constipation    Anemia of chronic disease    Essential hypertension    Orthostatic hypotension    Cervical myelopathy (HCC) 02/21/2020   Closed cervical spine fracture ([place]) 12/06/2019   Scapula fracture 03/05/2018   Chronic left shoulder pain 11/25/2017   Left cervical radiculopathy 09/29/2017   S/P shoulder replacement, left 05/15/2017   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  05/06/2016   High risk medication use 05/06/2016   Primary osteoarthritis of both hands 05/06/2016   Primary osteoarthritis of left knee 05/06/2016   Primary osteoarthritis of both feet 05/06/2016   Dyslipidemia 05/06/2016   Peripheral neuropathy 05/06/2016   Diverticulosis of intestine without bleeding 05/06/2016   Osteopenia  05/06/2016   Vitamin D deficiency 05/06/2016   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   History of GI bleed 05/06/2016   Cataract of both eyes 05/06/2016   Urinary urgency 04/30/2015   Chronic constipation 04/30/2015   MS (multiple sclerosis) ([place]) 03/23/2013   Expected blood loss anemia 06/16/2012   S/P right TKA 06/15/2012    Past Medical History:  Diagnosis Date   Anemia    Arthritis    GERD (gastroesophageal reflux disease)    History of blood transfusion    Hyperlipidemia    Hypertension  Lower extremity edema    MS (multiple sclerosis) (HCC)    Neuromuscular disorder (HCC)    multiple scleroosis/peripheral neuropathy   PONV (postoperative nausea and vomiting)    Rheumatoid arthritis (HCC)    Ulcer    Vitamin D deficiency     Family History  Problem Relation Age of Onset   Heart attack Father    Breast cancer Neg Hx    Past Surgical History:   Procedure Laterality Date   APPLICATION OF INTRAOPERATIVE CT SCAN N/A 02/16/2020   Procedure: APPLICATION OF INTRAOPERATIVE CT SCAN;  Surgeon: Judith Part, MD;  Location: [place];  Service: Neurosurgery;  Laterality: N/A;   AUGMENTATION MAMMAPLASTY     BACK SURGERY     breast augumentation     BREAST SURGERY     biopsy   COLONOSCOPY     DILATION AND CURETTAGE OF UTERUS     EYE SURGERY     both eys,cataracts   goiter     HALO APPLICATION N/A [id number]   Procedure: HALO TRACTION APPLICATION;  Surgeon: Judith Part, MD;  Location: [facility];  Service: Neurosurgery;  Laterality: N/A;   JOINT REPLACEMENT     KNEE ARTHROSCOPY Right    ORIF SHOULDER FRACTURE Left 03/05/2018   Procedure: OPEN REDUCTION INTERNAL FIXATION (ORIF) LEFT SCAPULA;  Surgeon: Netta Cedars, MD;  Location: [facility];  Service: Orthopedics;  Laterality: Left;   POSTERIOR CERVICAL FUSION/FORAMINOTOMY N/A 12/06/2019   Procedure: Cervical one-two Posterior instrumented fusion;  Surgeon: Judith Part, MD;  Location: [facility];  Service: Neurosurgery;  Laterality: N/A;   POSTERIOR CERVICAL FUSION/FORAMINOTOMY N/A 02/13/2020   Procedure: Revision of cervical instrumented fusion with Occiput to Cervical Four posterior instrumented fusion;  Surgeon: Judith Part, MD;  Location: [facility];  Service: Neurosurgery;  Laterality: N/A;  posterior   POSTERIOR CERVICAL FUSION/FORAMINOTOMY N/A 02/16/2020   Procedure: Occiput to Thoracic Two Posterior Cervical Fusion with AIRO and Application of Halo;  Surgeon: Judith Part, MD;  Location: [facility];  Service: Neurosurgery;  Laterality: N/A;   REVERSE SHOULDER ARTHROPLASTY Left 05/15/2017   Procedure: LEFT REVERSE SHOULDER ARTHROPLASTY;  Surgeon: Netta Cedars, MD;  Location: [place] [facility];  Service: Orthopedics;  Laterality: Left;   REVERSE SHOULDER ARTHROPLASTY Left 10/11/2018   Procedure: left shoulder irrigation and debridement, open poly exchange and removal of painful hardware;   Surgeon: Netta Cedars, MD;  Location: [facility];  Service: Orthopedics;  Laterality: Left;   SHOULDER HEMI-ARTHROPLASTY Left 12/11/2020   Procedure: Left reverse shoulder conversion to SHOULDER HEMI-ARTHROPLASTY;  Surgeon: Netta Cedars, MD;  Location: [facility];  Service: Orthopedics;  Laterality: Left;  with ISB   THYROID LOBECTOMY     TOTAL KNEE ARTHROPLASTY Right 06/15/2012   Procedure: RIGHT TOTAL KNEE ARTHROPLASTY;  Surgeon: Mauri Pole, MD;  Location: [facility];  Service: Orthopedics;  Laterality: Right;   WRIST SURGERY Left    Social History   Social History Narrative   Patient lives at home with her husband [name]).   Retired.   Education- college.   Caffeine- Two cups of decaf tea daily.   Right handed.         Immunization History  Administered Date(s) Administered   Influenza, High Dose Seasonal PF 12/03/2013   Influenza,inj,quad, With Preservative 11/23/2014   Influenza-Unspecified 12/19/2017   PFIZER(Purple Top)SARS-COV-2 Vaccination 04/16/2019, 05/29/2019, 01/09/2020   Td 12/15/2017   Zoster Recombinat (Shingrix) 08/10/2017, 01/05/2018     Objective: Vital Signs: BP (!) 158/88 (BP Location: Right Arm,  Patient Position: Sitting, Cuff Size: Normal)    Pulse 87    Ht 5\' 2"  (1.575 m)    Wt 115 lb 9.6 oz (52.4 kg)    BMI 21.14 kg/m    Physical Exam Vitals and nursing note reviewed.  Constitutional:      Appearance: She is well-developed.  HENT:     Head: Normocephalic and atraumatic.  Eyes:     Conjunctiva/sclera: Conjunctivae normal.  Cardiovascular:     Rate and Rhythm: Normal rate and regular rhythm.     Heart sounds: Normal heart sounds.  Pulmonary:     Effort: Pulmonary effort is normal.     Breath sounds: Normal breath sounds.  Abdominal:     General: Bowel sounds are normal.     Palpations: Abdomen is soft.  Musculoskeletal:     Cervical back: Normal range of motion.  Lymphadenopathy:     Cervical: No cervical adenopathy.  Skin:    General: Skin is  warm and dry.     Capillary Refill: Capillary refill takes less than 2 seconds.  Neurological:     Mental Status: She is alert and oriented to person, place, and time.  Psychiatric:        Behavior: Behavior normal.     Musculoskeletal Exam: She had very limited range of motion of her cervical spine.  Left shoulder joint abduction was limited to about 20 degrees.  Right shoulder joints and full range of motion.  Elbows and wrist joints with good range of motion.  There was no synovitis of her MCPs.  She has synovitis and inflammation over all of her PIP joints.  DIP thickening was noted.  Hip joints and knee joints in good range of motion.  There was no tenderness over ankles or MTPs.  CDAI Exam: CDAI Score: 18.2  Patient Global: 7 mm; Provider Global: 5 mm Swollen: 8 ; Tender: 9  Joint Exam 03/19/2021      Right  Left  Glenohumeral      Tender  PIP 2  Swollen Tender  Swollen Tender  PIP 3  Swollen Tender  Swollen Tender  PIP 4  Swollen Tender  Swollen Tender  PIP 5  Swollen Tender  Swollen Tender     Investigation: No additional findings.  Imaging: No results found.  Recent Labs: Lab Results  Component Value Date   WBC 5.9 03/06/2021   HGB 10.3 (L) 03/06/2021   PLT 218 03/06/2021   NA 138 03/06/2021   K 4.5 03/06/2021   CL 105 03/06/2021   CO2 25 03/06/2021   GLUCOSE 78 03/06/2021   BUN 19 03/06/2021   CREATININE 0.81 03/06/2021   BILITOT 0.9 03/06/2021   ALKPHOS 100 03/06/2021   AST 16 03/06/2021   ALT 11 03/06/2021   PROT 7.1 03/06/2021   ALBUMIN 4.3 03/06/2021   CALCIUM 9.3 03/06/2021   GFRAA 98 04/03/2020   QFTBGOLDPLUS NEGATIVE 02/18/2021    Speciality Comments: PLQ Eye Exam:12/06/2020 WNL@ [facility] Osteoporosis managed by PCP-ACY 10/12/2018 Rinvoq-diarrhea, Xeljanz-12/19/20  Procedures:  No procedures performed Allergies: Meperidine hcl, Codeine, and Demerol [meperidine]   Assessment / Plan:     Visit Diagnoses: Rheumatoid arthritis  involving multiple sites with positive rheumatoid factor (HCC)-she switched from Rinvoq to [place] in October 2022.  She states she ran out of [place] about a month ago.  She has been having increased pain and swelling in her hands.  None of the other joints are painful.  Synovitis was noted over bilateral PIPs  today.  We will reassess her arthritis in couple of months as there was interruption in the treatment.  Prescription refill for Morrie Sheldon will be sent.  If need be the dose of Morrie Sheldon can be increased in the future.  High risk medication use - Xeljanz 5 mg 1 tablet by mouth daily and Plaquenil 200 mg 1 tablet by mouth once daily.  Discontinued Rinvoq due to diarrhea.  Side effects of Xeljanz including the Mace blackbox warning was discussed.  Patient voiced understanding.  Labs from March 06, 2021 were reviewed which showed anemia with hemoglobin of 10.3.  CMP was normal.  TB gold was negative on February 18, 2021.  Left findings were discussed.  She was advised to get labs and April and then every 3 months.  Information regarding ministration was placed in the AVS.  She was advised to hold on Xeljanz in case she develops an infection.  Increased risk of abdominal discomfort and intestinal perforation was also discussed.  H/O total shoulder replacement, left - Revision x2 performed by Dr. Alma Friendly.  She has limited range of motion.  Primary osteoarthritis of both hands-she has severe osteoarthritis in her hands.  She has synovitis over all of her PIP joints.  Status post right knee replacement-doing well.  Primary osteoarthritis of left knee-she denies any discomfort today.  Primary osteoarthritis of both feet-she has off-and-on pain.  Closed fracture of cervical vertebra, unspecified cervical vertebral level, sequela - fall in January 2020 at which time she fractured C1 and C2 with displacement requiring fusion on 12/06/2019.  She has very limited range of motion of her cervical spine.  Postural  kyphosis of thoracic region  Osteopenia of multiple sites - 02/22/18: The BMD measured at [facility] Right is 0.742 g/cm2 with a T-score of -2.1.  Forteo was a started in February 2022.  Her labs are stable.  History of vitamin D deficiency - She is on vitamin D 5000 units daily.  History of multiple sclerosis ([place])  History of GI bleed  History of diverticulosis  Dyslipidemia  History of peripheral neuropathy  Orders: No orders of the defined types were placed in this encounter.  No orders of the defined types were placed in this encounter.    Follow-Up Instructions: Return in about 2 months (around 05/17/2021) for Rheumatoid arthritis, Osteoarthritis.   Bo Merino, MD  Note - This record has been created using Editor, commissioning.  Chart creation errors have been sought, but may not always  have been located. Such creation errors do not reflect on  the standard of medical care.

## 2021-03-13 ENCOUNTER — Inpatient Hospital Stay (HOSPITAL_BASED_OUTPATIENT_CLINIC_OR_DEPARTMENT_OTHER): Payer: Medicare PPO | Admitting: Hematology

## 2021-03-13 DIAGNOSIS — D649 Anemia, unspecified: Secondary | ICD-10-CM

## 2021-03-13 DIAGNOSIS — G35 Multiple sclerosis: Secondary | ICD-10-CM | POA: Diagnosis not present

## 2021-03-14 ENCOUNTER — Ambulatory Visit: Payer: Medicare PPO | Admitting: Rheumatology

## 2021-03-14 ENCOUNTER — Ambulatory Visit: Payer: Medicare PPO

## 2021-03-15 ENCOUNTER — Other Ambulatory Visit (HOSPITAL_COMMUNITY): Payer: Self-pay

## 2021-03-18 ENCOUNTER — Ambulatory Visit: Payer: Medicare PPO | Admitting: Rheumatology

## 2021-03-19 ENCOUNTER — Other Ambulatory Visit: Payer: Self-pay

## 2021-03-19 ENCOUNTER — Encounter: Payer: Self-pay | Admitting: Rheumatology

## 2021-03-19 ENCOUNTER — Ambulatory Visit: Payer: Medicare PPO | Admitting: Rheumatology

## 2021-03-19 VITALS — BP 158/88 | HR 87 | Ht 62.0 in | Wt 115.6 lb

## 2021-03-19 DIAGNOSIS — M19042 Primary osteoarthritis, left hand: Secondary | ICD-10-CM

## 2021-03-19 DIAGNOSIS — M4004 Postural kyphosis, thoracic region: Secondary | ICD-10-CM | POA: Diagnosis not present

## 2021-03-19 DIAGNOSIS — E785 Hyperlipidemia, unspecified: Secondary | ICD-10-CM

## 2021-03-19 DIAGNOSIS — Z96612 Presence of left artificial shoulder joint: Secondary | ICD-10-CM | POA: Diagnosis not present

## 2021-03-19 DIAGNOSIS — Z96651 Presence of right artificial knee joint: Secondary | ICD-10-CM

## 2021-03-19 DIAGNOSIS — M19041 Primary osteoarthritis, right hand: Secondary | ICD-10-CM | POA: Diagnosis not present

## 2021-03-19 DIAGNOSIS — Z8669 Personal history of other diseases of the nervous system and sense organs: Secondary | ICD-10-CM

## 2021-03-19 DIAGNOSIS — S129XXS Fracture of neck, unspecified, sequela: Secondary | ICD-10-CM | POA: Diagnosis not present

## 2021-03-19 DIAGNOSIS — M1712 Unilateral primary osteoarthritis, left knee: Secondary | ICD-10-CM

## 2021-03-19 DIAGNOSIS — M19072 Primary osteoarthritis, left ankle and foot: Secondary | ICD-10-CM

## 2021-03-19 DIAGNOSIS — M19071 Primary osteoarthritis, right ankle and foot: Secondary | ICD-10-CM

## 2021-03-19 DIAGNOSIS — Z8639 Personal history of other endocrine, nutritional and metabolic disease: Secondary | ICD-10-CM

## 2021-03-19 DIAGNOSIS — G35 Multiple sclerosis: Secondary | ICD-10-CM

## 2021-03-19 DIAGNOSIS — M0579 Rheumatoid arthritis with rheumatoid factor of multiple sites without organ or systems involvement: Secondary | ICD-10-CM | POA: Diagnosis not present

## 2021-03-19 DIAGNOSIS — Z79899 Other long term (current) drug therapy: Secondary | ICD-10-CM

## 2021-03-19 DIAGNOSIS — Z8719 Personal history of other diseases of the digestive system: Secondary | ICD-10-CM

## 2021-03-19 DIAGNOSIS — M8589 Other specified disorders of bone density and structure, multiple sites: Secondary | ICD-10-CM

## 2021-03-19 MED ORDER — XELJANZ 5 MG PO TABS
5.0000 mg | ORAL_TABLET | Freq: Every day | ORAL | 2 refills | Status: DC
  Filled 2021-03-19: qty 30, 30d supply, fill #0

## 2021-03-19 NOTE — Progress Notes (Addendum)
Marland Kitchen   HEMATOLOGY/ONCOLOGY PHONE VISIT  NOTE  Date of Service: .03/13/2021   Patient Care Team: Michele Hazel, MD as PCP - General (Family Medicine)  CHIEF COMPLAINTS/PURPOSE OF CONSULTATION:  Phone visit to discuss lab results from work-up for anemia .  HISTORY OF PRESENTING ILLNESS:   ADREENA Bryan is a wonderful 81 y.o. female who has been referred to [place] by Dr .Michele Hazel, MD / Dr Michele Savoy MD for evaluation and management of Anemia.  Patient has a history of hypertension, dyslipidemia, peripheral neuropathy, rheumatoid arthritis on Plaquenil, osteoporosis history of multiple surgeries including multiple orthopedic surgeries as noted below. Most recently she had a left shoulder surgery with Dr. Ranell Patrick on 12/11/2020.  Review of her available labs show her hemoglobin was 10.5 on 12/06/2020 prior to her left shoulder surgery with an MCV of 89 and normal WBC count and platelets. CBC done with her rheumatologist [name] on 02/18/2021 showed a hemoglobin of 9.6 with an MCV of 86 WBC count of 5.5k platelet count of 231k. I do not see any additional work-up having been done for the anemia.  Patient was referred to [place] for further evaluation and management. Patient notes some fatigue and some generalized weakness but no presyncopal or syncopal symptoms. No acute chest pain or shortness of breath. Multiple joint pains and back pain related to her rheumatoid arthritis and surgeries.  She is accompanied for this clinic visit by her husband. She endorses no acute weight loss or other constitutional symptoms such as fevers chills night sweats.  INTERVAL HISTORY  .I connected with Michele Bryan on 03/21/21 at 10:00 AM EST by telephone visit and verified that I am speaking with the correct person using two identifiers.   I discussed the limitations, risks, security and privacy concerns of performing an evaluation and management service by telemedicine and the availability of  in-person appointments. I also discussed with the patient that there may be a patient responsible charge related to this service. The patient expressed understanding and agreed to proceed.   Other persons participating in the visit and their role in the encounter: none   Patients location: Home  Providers location: [facility]  Chief Complaint: Lab review of anemia work-up.  Patient notes no acute new symptoms since her last clinic visit. Does not note any acute bleeding.  Labs CBC shows hemoglobin of 10.3 with WBC count of 5.9 and platelets of 218k CMP within normal limits Ferritin 19 with an iron saturation of 9% suggesting iron deficiency Myeloma panel with no monoclonal paraproteinemia B12 and folate within normal limits Copper within normal limits at 123 Sedimentation rate 6 TSH 1.1 within normal limits.    MEDICAL HISTORY:  Past Medical History:  Diagnosis Date   Anemia    Arthritis    GERD (gastroesophageal reflux disease)    History of blood transfusion    Hyperlipidemia    Hypertension    Lower extremity edema    MS (multiple sclerosis) (HCC)    Neuromuscular disorder (HCC)    multiple scleroosis/peripheral neuropathy   PONV (postoperative nausea and vomiting)    Rheumatoid arthritis (HCC)    Ulcer    Vitamin D deficiency     SURGICAL HISTORY: Past Surgical History:  Procedure Laterality Date   APPLICATION OF INTRAOPERATIVE CT SCAN N/A 02/16/2020   Procedure: APPLICATION OF INTRAOPERATIVE CT SCAN;  Surgeon: Jadene Pierini, MD;  Location: [facility] OR;  Service: Neurosurgery;  Laterality: N/A;   AUGMENTATION MAMMAPLASTY  BACK SURGERY     breast augumentation     BREAST SURGERY     biopsy   COLONOSCOPY     DILATION AND CURETTAGE OF UTERUS     EYE SURGERY     both eys,cataracts   goiter     HALO APPLICATION N/A 02/16/2020   Procedure: HALO TRACTION APPLICATION;  Surgeon: Jadene Pierini, MD;  Location: [facility] OR;  Service: Neurosurgery;   Laterality: N/A;   JOINT REPLACEMENT     KNEE ARTHROSCOPY Right    ORIF SHOULDER FRACTURE Left 03/05/2018   Procedure: OPEN REDUCTION INTERNAL FIXATION (ORIF) LEFT SCAPULA;  Surgeon: Beverely Low, MD;  Location: [facility] OR;  Service: Orthopedics;  Laterality: Left;   POSTERIOR CERVICAL FUSION/FORAMINOTOMY N/A 12/06/2019   Procedure: Cervical one-two Posterior instrumented fusion;  Surgeon: Jadene Pierini, MD;  Location: [facility] OR;  Service: Neurosurgery;  Laterality: N/A;   POSTERIOR CERVICAL FUSION/FORAMINOTOMY N/A 02/13/2020   Procedure: Revision of cervical instrumented fusion with Occiput to Cervical Four posterior instrumented fusion;  Surgeon: Jadene Pierini, MD;  Location: [facility] OR;  Service: Neurosurgery;  Laterality: N/A;  posterior   POSTERIOR CERVICAL FUSION/FORAMINOTOMY N/A 02/16/2020   Procedure: Occiput to Thoracic Two Posterior Cervical Fusion with AIRO and Application of Halo;  Surgeon: Jadene Pierini, MD;  Location: [facility] OR;  Service: Neurosurgery;  Laterality: N/A;   REVERSE SHOULDER ARTHROPLASTY Left 05/15/2017   Procedure: LEFT REVERSE SHOULDER ARTHROPLASTY;  Surgeon: Beverely Low, MD;  Location: [facility] OR;  Service: Orthopedics;  Laterality: Left;   REVERSE SHOULDER ARTHROPLASTY Left 10/11/2018   Procedure: left shoulder irrigation and debridement, open poly exchange and removal of painful hardware;  Surgeon: Beverely Low, MD;  Location: [facility] ORS;  Service: Orthopedics;  Laterality: Left;   SHOULDER HEMI-ARTHROPLASTY Left 12/11/2020   Procedure: Left reverse shoulder conversion to SHOULDER HEMI-ARTHROPLASTY;  Surgeon: Beverely Low, MD;  Location: [facility];  Service: Orthopedics;  Laterality: Left;  with ISB   THYROID LOBECTOMY     TOTAL KNEE ARTHROPLASTY Right 06/15/2012   Procedure: RIGHT TOTAL KNEE ARTHROPLASTY;  Surgeon: [name], MD;  Location: [facility];  Service: Orthopedics;  Laterality: Right;   WRIST SURGERY Left     SOCIAL HISTORY: Social History   Socioeconomic  History   Marital status: Married    Spouse name: Michele Bryan   Number of children: 3   Years of education: college   Highest education level: Not on file  Occupational History    Comment: retired  Tobacco Use   Smoking status: Never   Smokeless tobacco: Never  Vaping Use   Vaping Use: Never used  Substance and Sexual Activity   Alcohol use: No   Drug use: Never   Sexual activity: Not on file  Other Topics Concern   Not on file  Social History Narrative   Patient lives at home with her husband Michele Bryan).   Retired.   Education- college.   Caffeine- Two cups of decaf tea daily.   Right handed.         Social Determinants of Health   Financial Resource Strain: Not on file  Food Insecurity: Not on file  Transportation Needs: Not on file  Physical Activity: Not on file  Stress: Not on file  Social Connections: Not on file  Intimate Partner Violence: Not on file    FAMILY HISTORY: Family History  Problem Relation Age of Onset   Heart attack Father    Breast cancer Neg Hx     ALLERGIES:  is allergic to meperidine hcl, codeine, and demerol [meperidine].  MEDICATIONS:  Current Outpatient Medications  Medication Sig Dispense Refill   acetaminophen (TYLENOL) 650 MG CR tablet Take 1,300 mg by mouth daily. Take additional  1,300 mg of needed in the evening     alendronate (FOSAMAX) 70 MG tablet Take 70 mg by mouth once a week. Tuesday     amitriptyline (ELAVIL) 50 MG tablet Take 1 tablet (50 mg total) by mouth at bedtime. 30 tablet 1   amLODipine (NORVASC) 5 MG tablet Take 1 tablet (5 mg total) by mouth daily. 30 tablet 0   Cholecalciferol (VITAMIN D) 125 MCG (5000 UT) CAPS Take 5,000 Units by mouth daily. 30 capsule 0   Cyanocobalamin (VITAMIN B-12) 5000 MCG TBDP Take 5,000 mcg by mouth daily. (Patient taking differently: Take by mouth 3 (three) times a week. Energy) 30 tablet 0   docusate sodium (COLACE) 100 MG capsule Take 1 capsule (100 mg total) by mouth daily. (Patient taking  differently: Take 100 mg by mouth daily as needed for mild constipation or moderate constipation.) 10 capsule 0   furosemide (LASIX) 20 MG tablet Take 1 tablet (20 mg total) by mouth daily as needed (as needed for swelling). 15 tablet 3   gabapentin (NEURONTIN) 100 MG capsule Take 1 capsule (100 mg total) by mouth 3 (three) times daily. 90 capsule 11   hydroxychloroquine (PLAQUENIL) 200 MG tablet TAKE ONE TABLET BY MOUTH ONE TIME DAILY 90 tablet 0   hyoscyamine (LEVBID) 0.375 MG 12 hr tablet Take 0.375 mg by mouth every 12 (twelve) hours as needed for cramping.     Insulin Pen Needle (PEN NEEDLES) 31G X 5 MM MISC Use to inject Forteo once daily. DO NOT RE-USE. 100 each 3   Insulin Pen Needle 31G X 5 MM MISC USE TO INJECT FORTEO ONCE DAILY. DO NOT RE-USE. 100 each 3   losartan (COZAAR) 50 MG tablet Take 50 mg by mouth 2 (two) times daily.     nystatin cream (MYCOSTATIN) Apply 1 application topically daily as needed.     omeprazole (PRILOSEC) 40 MG capsule Take 1 capsule (40 mg total) by mouth daily. 30 capsule 0   ondansetron (ZOFRAN) 4 MG tablet Take 4 mg by mouth every 8 (eight) hours as needed for nausea or vomiting.     polyethylene glycol (MIRALAX / GLYCOLAX) 17 g packet Take 17 g by mouth daily. (Patient taking differently: Take 17 g by mouth daily as needed.) 14 each 0   potassium chloride SA (KLOR-CON) 20 MEQ tablet Take 1 tablet (20 mEq total) by mouth as needed (when you take Furosemide). 15 tablet 3   rosuvastatin (CRESTOR) 5 MG tablet Take 5 mg by mouth daily.     Teriparatide, Recombinant, (FORTEO) 600 MCG/2.4ML SOPN Inject 20 mcg into the skin daily. Discard after 28 days 2.4 mL 5   Tofacitinib Citrate (XELJANZ) 5 MG TABS Take 1 tablet (5 mg total) by mouth in the morning and at bedtime. (Patient taking differently: Take 5 mg by mouth daily.) 60 tablet 2   traMADol (ULTRAM) 50 MG tablet Take 1 tablet (50 mg total) by mouth every 6 (six) hours as needed for moderate pain. 30 tablet 0    zolpidem (AMBIEN) 10 MG tablet Take 10 mg by mouth at bedtime as needed for sleep.     No current facility-administered medications for this visit.    REVIEW OF SYSTEMS:    No acute change in clinical condition since her  last clinic visit  PHYSICAL EXAMINATION: Telemedicine visit LABORATORY DATA:  I have reviewed the data as listed  . CBC Latest Ref Rng & Units 03/06/2021 03/06/2021 02/18/2021  WBC 4.0 - 10.5 K/uL 5.9 - 5.5  Hemoglobin 12.0 - 15.0 g/dL 10.3(L) - 9.6(L)  Hematocrit 34.0 - 46.6 % 31.5(L) 33.4(L) 30.8(L)  Platelets 150 - 400 K/uL 218 - 231    . CMP Latest Ref Rng & Units 03/06/2021 12/12/2020 12/06/2020  Glucose 70 - 99 mg/dL 78 153(H) 108(H)  BUN 8 - 23 mg/dL 19 20 18   Creatinine 0.44 - 1.00 mg/dL 0.81 0.67 0.63  Sodium 135 - 145 mmol/L 138 138 139  Potassium 3.5 - 5.1 mmol/L 4.5 4.1 4.1  Chloride 98 - 111 mmol/L 105 105 108  CO2 22 - 32 mmol/L 25 23 24   Calcium 8.9 - 10.3 mg/dL 9.3 8.7(L) 9.8  Total Protein 6.5 - 8.1 g/dL 7.1 - 6.2  Total Bilirubin 0.3 - 1.2 mg/dL 0.9 - 0.7  Alkaline Phos 38 - 126 U/L 100 - -  AST 15 - 41 U/L 16 - 16  ALT 0 - 44 U/L 11 - 11   Component     Latest Ref Rng & Units 03/06/2021 03/06/2021        12:19 PM 12:20 PM  WBC     4.0 - 10.5 K/uL 5.9   RBC     3.87 - 5.11 MIL/uL 3.83 (L)   Hemoglobin     12.0 - 15.0 g/dL 10.3 (L)   HCT     34.0 - 46.6 % 33.4 (L) 31.5 (L)  MCV     80.0 - 100.0 fL 87.2   MCH     26.0 - 34.0 pg 26.9   MCHC     30.0 - 36.0 g/dL 30.8   RDW     11.5 - 15.5 % 15.5   Platelets     150 - 400 K/uL 218   nRBC     0.0 - 0.2 % 0.0   Neutrophils     % 53   NEUT#     1.7 - 7.7 K/uL 3.1   Lymphocytes     % 33   Lymphocyte #     0.7 - 4.0 K/uL 2.0   Monocytes Relative     % 11   Monocyte #     0.1 - 1.0 K/uL 0.6   Eosinophil     % 2   Eosinophils Absolute     0.0 - 0.5 K/uL 0.1   Basophil     % 1   Basophils Absolute     0.0 - 0.1 K/uL 0.1   Immature Granulocytes     % 0   Abs Immature  Granulocytes     0.00 - 0.07 K/uL 0.02   Sodium     135 - 145 mmol/L 138   Potassium     3.5 - 5.1 mmol/L 4.5   Chloride     98 - 111 mmol/L 105   CO2     22 - 32 mmol/L 25   Glucose     70 - 99 mg/dL 78   BUN     8 - 23 mg/dL 19   Creatinine     0.44 - 1.00 mg/dL 0.81   Calcium     8.9 - 10.3 mg/dL 9.3   Total Protein     6.5 - 8.1 g/dL 7.1   Albumin     3.5 - 5.0  g/dL 4.3   AST     15 - 41 U/L 16   ALT     0 - 44 U/L 11   Alkaline Phosphatase     38 - 126 U/L 100   Total Bilirubin     0.3 - 1.2 mg/dL 0.9   GFR, Est Non African American     >60 mL/min >60   Anion gap     5 - 15 8   IgG (Immunoglobin G), Serum     586 - 1,602 mg/dL  921  IgA     64 - 422 mg/dL  82  IgM (Immunoglobulin M), Srm     26 - 217 mg/dL  74  Total Protein ELP     6.0 - 8.5 g/dL  6.3  Albumin SerPl Elph-Mcnc     2.9 - 4.4 g/dL  3.6  Alpha 1     0.0 - 0.4 g/dL  0.2  Alpha2 Glob SerPl Elph-Mcnc     0.4 - 1.0 g/dL  0.6  B-Globulin SerPl Elph-Mcnc     0.7 - 1.3 g/dL  0.9  Gamma Glob SerPl Elph-Mcnc     0.4 - 1.8 g/dL  0.9  M Protein SerPl Elph-Mcnc     Not Observed g/dL  Not Observed  Globulin, Total     2.2 - 3.9 g/dL  2.7  Albumin/Glob SerPl     0.7 - 1.7  1.4  IFE 1       Comment  Please Note (HCV):       Comment  Retic Ct Pct     0.4 - 3.1 %  1.3  RBC.     3.87 - 5.11 MIL/uL  3.81 (L)  Retic Count, Absolute     19.0 - 186.0 K/uL  48.4  Immature Retic Fract     2.3 - 15.9 %  14.0  Iron     28 - 170 ug/dL 42   TIBC     250 - 450 ug/dL 487 (H)   Saturation Ratios     10.4 - 31.8 % 9 (L)   UIBC     148 - 442 ug/dL 445 (H)   Folate, Hemolysate     Not Estab. ng/mL  389.0  Folate, RBC     >498 ng/mL  1,235  Ferritin     11 - 307 ng/mL 19   Vitamin B12     180 - 914 pg/mL 1,475 (H)   Copper     80 - 158 ug/dL 123   Sed Rate     0 - 22 mm/hr 6   TSH     0.308 - 3.960 uIU/mL  1.100   RADIOGRAPHIC STUDIES: I have personally reviewed the radiological images as  listed and agreed with the findings in the report. No results found.  ASSESSMENT & PLAN:   81 year old female with history of hypertension, dyslipidemia, rheumatoid arthritis on Plaquenil with  1) Mild normocytic anemia Her baseline hemoglobin appears to be in the 10-11 range over the last year. Hemoglobin was 10.5 on 12/06/2020 prior to her left shoulder surgery and dropped to 9.6 likely due to blood loss with surgery. PLAN -Lab results discussed in detail with the patient  Labs CBC shows hemoglobin of 10.3 with WBC count of 5.9 and platelets of 218k CMP within normal limits Ferritin 19 with an iron saturation of 9% suggesting iron deficiency Myeloma panel with no monoclonal paraproteinemia B12 and folate within normal limits Copper within normal limits  at 123 Sedimentation rate 6 TSH 1.1 within normal limits.  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 -She is also probably not absorbing iron well due to her chronic PPI use and has had a fair amount of blood loss with her multiple surgeries. -We will defer to primary care physician to determine if GI work-up is warranted for work-up of iron deficiency. -I offered the patient the option of IV iron replacement and discussed the pros and cons of this.  We discussed that we would like to keep her ferritin levels more than 100 and iron saturation more than 20% to address her anemia of chronic inflammation plus iron deficiency especially in the context of her rheumatoid arthritis. -Alternatively she could take p.o. iron polysaccharide 150 mg p.o. daily and increase to twice daily if tolerated. -Patient notes she wants to talk to her husband prior to making a decision about the IV iron and will call us back regarding this.  Follow-up Patient to decide on whether she would like to take IV iron and will let us know prior to setting up additional  appointments.   All of the patients questions were answered with apparent satisfaction. The patient knows to call the clinic with any problems, questions or concerns.   Sullivan Lone MD [place] AAHIVMS [facility] Hematology/Oncology Physician [facility]

## 2021-03-19 NOTE — Patient Instructions (Addendum)
Standing Labs °We placed an order today for your standing lab work.  ° °Please have your standing labs drawn in April and every 3 months ° °If possible, please have your labs drawn 2 weeks prior to your appointment so that the provider can discuss your results at your appointment. ° °Please note that you may see your imaging and lab results in MyChart before we have reviewed them. °We may be awaiting multiple results to interpret others before contacting you. °Please allow our office up to 72 hours to thoroughly review all of the results before contacting the office for clarification of your results. ° °We have open lab daily: °Monday through Thursday from 1:30-4:30 PM and Friday from 1:30-4:00 PM °at the office of Dr. Kinda Pottle, [facility].   °Please be advised, all patients with office appointments requiring lab work will take precedent over walk-in lab work.  °If possible, please come for your lab work on Monday and Friday afternoons, as you may experience shorter wait times. °The office is located at [address], [address], [place], [place] [address] °No appointment is necessary.   °Labs are drawn by [facility]. Please bring your co-pay at the time of your lab draw.  You may receive a bill from [facility] for your lab work. ° °Please note if you are on Hydroxychloroquine and and an order has been placed for a Hydroxychloroquine level, you will need to have it drawn 4 hours or more after your last dose. ° °If you wish to have your labs drawn at another location, please call the office 24 hours in advance to send orders. ° °If you have any questions regarding directions or hours of operation,  °please call [phone].   °As a reminder, please drink plenty of water prior to coming for your lab work. Thanks!  °Vaccines °You are taking a medication(s) that can suppress your immune system.  The following immunizations are recommended: °Flu annually °Covid-19  °Td/Tdap (tetanus, diphtheria, pertussis)  every 10 years °Pneumonia (Prevnar 15 then Pneumovax 23 at least 1 year apart.  Alternatively, can take Prevnar 20 without needing additional dose) °Shingrix: 2 doses from 4 weeks to 6 months apart ° °Please check with your PCP to make sure you are up to date.  ° °If you have signs or symptoms of an infection or start antibiotics: °First, call your PCP for workup of your infection. °Hold your medication through the infection, until you complete your antibiotics, and until symptoms resolve if you take the following: °Injectable medication (Actemra, Benlysta, Cimzia, Cosentyx, Enbrel, Humira, Kevzara, Orencia, Remicade, Simponi, Stelara, Taltz, Tremfya) °Methotrexate °Leflunomide (Arava) °Mycophenolate (Cellcept) °Xeljanz, Olumiant, or Rinvoq  °

## 2021-03-19 NOTE — Telephone Encounter (Signed)
Patient can fill Harriette Oharaxeljanz through [facility].

## 2021-03-20 ENCOUNTER — Other Ambulatory Visit (HOSPITAL_COMMUNITY): Payer: Self-pay

## 2021-03-20 ENCOUNTER — Telehealth: Payer: Self-pay

## 2021-03-20 ENCOUNTER — Telehealth: Payer: Self-pay | Admitting: Pharmacist

## 2021-03-20 DIAGNOSIS — Z79899 Other long term (current) drug therapy: Secondary | ICD-10-CM

## 2021-03-20 DIAGNOSIS — M0579 Rheumatoid arthritis with rheumatoid factor of multiple sites without organ or systems involvement: Secondary | ICD-10-CM

## 2021-03-20 NOTE — Telephone Encounter (Signed)
Patient's husband [name] left a voicemail stating he received a message from the specialty pharmacy that there was a problem with the prescription.  He states he thinks it is because the prescription was sent in for 60 pills and they will only approve 30.  [name] is requesting the prescription be rewritten for 2 pills/day with the understanding that she would only take 1 pill/day until its determined if 2 a day is necessary.

## 2021-03-20 NOTE — Telephone Encounter (Signed)
Received notification from [facility] that they cannot break bottle of Michele Bryan that has 60 tabs.  Patient taking Michele Bryan 5mg  once daily. Will try to run auth for 60 tabs per 60 days. May require qty limit exception override  Chesley Mires, PharmD, MPH, BCPS Clinical Pharmacist (Rheumatology and Pulmonology)

## 2021-03-20 NOTE — Telephone Encounter (Signed)
Submitted a Prior Authorization request to [facility] for 60-day supply of XELJANZ via CoverMyMeds. Will update once we receive a response.   Key: [id number]

## 2021-03-20 NOTE — Telephone Encounter (Signed)
Received a fax regarding Prior Authorization from [facility] for [facility]. Authorization has been DENIED because:

## 2021-03-21 ENCOUNTER — Other Ambulatory Visit (HOSPITAL_COMMUNITY): Payer: Self-pay

## 2021-03-21 MED ORDER — XELJANZ 5 MG PO TABS
5.0000 mg | ORAL_TABLET | Freq: Two times a day (BID) | ORAL | 2 refills | Status: DC
  Filled 2021-03-21 (×2): qty 60, 30d supply, fill #0
  Filled 2021-04-15: qty 60, 30d supply, fill #1

## 2021-03-21 NOTE — Telephone Encounter (Signed)
After discussion with Dr Corliss Skains, we will send rx for Harriette Ohara to be written 5mg  twice daily to allow pharmacy to fill. Patient will continue taking once daily until f/u visit discussion.  Called patient's husband, [name], and reviewed. He verbalized understanding to above. Patient will continue taking Xeljanz 5mg  once daily until f/u visit. [facility] will reach out to schedule shipment   Chesley Mires, PharmD, MPH, BCPS Clinical Pharmacist (Rheumatology and Pulmonology)

## 2021-03-21 NOTE — Telephone Encounter (Addendum)
After discussion with Dr Corliss Skainseveshwar, we will send rx for Harriette OharaXeljanz to be written 5mg  twice daily to allow pharmacy to fill. Patient will continue taking once daily until f/u visit discussion.  Called patient's husband, Baldo AshCarl, and reviewed. He verbalized understanding to above.  Chesley Miresevki Jacora Hopkins, PharmD, MPH, BCPS Clinical Pharmacist (Rheumatology and Pulmonology)

## 2021-03-26 ENCOUNTER — Other Ambulatory Visit (HOSPITAL_COMMUNITY): Payer: Self-pay

## 2021-03-26 MED FILL — Insulin Pen Needle 31 G X 5 MM (1/5" or 3/16"): 100 days supply | Qty: 100 | Fill #2 | Status: AC

## 2021-03-27 ENCOUNTER — Other Ambulatory Visit: Payer: Self-pay

## 2021-03-27 ENCOUNTER — Ambulatory Visit
Admission: RE | Admit: 2021-03-27 | Discharge: 2021-03-27 | Disposition: A | Discharge status: Home / Self Care | Payer: Medicare PPO | Source: Ambulatory Visit | Attending: Family Medicine | Admitting: Family Medicine

## 2021-03-27 DIAGNOSIS — Z1231 Encounter for screening mammogram for malignant neoplasm of breast: Secondary | ICD-10-CM

## 2021-04-08 ENCOUNTER — Other Ambulatory Visit (HOSPITAL_COMMUNITY): Payer: Self-pay

## 2021-04-10 NOTE — Progress Notes (Signed)
Lipid panel WNL on 02/11/21

## 2021-04-12 DIAGNOSIS — S129XXA Fracture of neck, unspecified, initial encounter: Secondary | ICD-10-CM | POA: Diagnosis not present

## 2021-04-15 ENCOUNTER — Other Ambulatory Visit: Payer: Self-pay | Admitting: Rheumatology

## 2021-04-15 ENCOUNTER — Other Ambulatory Visit: Payer: Self-pay | Admitting: Physician Assistant

## 2021-04-15 ENCOUNTER — Other Ambulatory Visit (HOSPITAL_COMMUNITY): Payer: Self-pay

## 2021-04-15 DIAGNOSIS — M0579 Rheumatoid arthritis with rheumatoid factor of multiple sites without organ or systems involvement: Secondary | ICD-10-CM

## 2021-04-15 DIAGNOSIS — M8589 Other specified disorders of bone density and structure, multiple sites: Secondary | ICD-10-CM

## 2021-04-15 DIAGNOSIS — S129XXS Fracture of neck, unspecified, sequela: Secondary | ICD-10-CM

## 2021-04-15 MED ORDER — FORTEO 600 MCG/2.4ML ~~LOC~~ SOPN
PEN_INJECTOR | SUBCUTANEOUS | 5 refills | Status: DC
  Filled 2021-04-15: qty 2.4, 28d supply, fill #0
  Filled 2021-06-25 – 2021-07-02 (×2): qty 2.4, 28d supply, fill #1
  Filled 2021-07-30: qty 2.4, 28d supply, fill #2
  Filled 2021-10-28: qty 2.4, 28d supply, fill #3
  Filled 2021-11-18: qty 2.4, 28d supply, fill #4
  Filled 2022-01-08: qty 2.4, 28d supply, fill #5

## 2021-04-15 NOTE — Telephone Encounter (Signed)
Next Visit: 05/23/2021   Last Visit: 03/19/2021   Last Fill: 12/03/2020  DX: Rheumatoid arthritis involving multiple sites with positive rheumatoid factor   Current Dose per office note 03/19/2021: Plaquenil 200 mg 1 tablet by mouth once daily  Labs: 03/06/2021 RBC 3.83, Hgb 10.3, Hct 33.4  PLQ Eye Exam:12/06/2020 WNL   Okay to refill PLQ?

## 2021-04-15 NOTE — Telephone Encounter (Signed)
Next Visit: 05/23/2021  Last Visit: 03/19/2021  Last Fill: 08/28/2021   DX: Osteopenia of multiple sites   Current Dose per office note 03/19/2021: Forteo was a started in February 2022  Labs: 03/06/2021 RBC 3.83, Hgb 10.3, Hct 33.4  Okay to refill Forteo?

## 2021-04-16 ENCOUNTER — Other Ambulatory Visit (HOSPITAL_COMMUNITY): Payer: Self-pay

## 2021-04-16 ENCOUNTER — Other Ambulatory Visit: Payer: Self-pay | Admitting: Neurological Surgery

## 2021-04-16 DIAGNOSIS — S129XXA Fracture of neck, unspecified, initial encounter: Secondary | ICD-10-CM

## 2021-04-22 ENCOUNTER — Other Ambulatory Visit: Payer: Self-pay | Admitting: Hematology

## 2021-04-22 ENCOUNTER — Telehealth: Payer: Self-pay

## 2021-04-22 DIAGNOSIS — D508 Other iron deficiency anemias: Secondary | ICD-10-CM | POA: Diagnosis not present

## 2021-04-22 DIAGNOSIS — D509 Iron deficiency anemia, unspecified: Secondary | ICD-10-CM | POA: Insufficient documentation

## 2021-04-22 DIAGNOSIS — I10 Essential (primary) hypertension: Secondary | ICD-10-CM | POA: Diagnosis not present

## 2021-04-22 DIAGNOSIS — M810 Age-related osteoporosis without current pathological fracture: Secondary | ICD-10-CM | POA: Diagnosis not present

## 2021-04-22 NOTE — Telephone Encounter (Signed)
Returned call to pt . Pt wanted to let Dr Irene Limbo know that she does want to get iron infusions. Will update Dr Irene Limbo and have scheduling call her.

## 2021-04-23 ENCOUNTER — Telehealth: Payer: Self-pay | Admitting: Hematology

## 2021-04-23 NOTE — Telephone Encounter (Signed)
Sch per 2/20 inbasket ,unable to leave message, calendar mailed °

## 2021-04-30 ENCOUNTER — Inpatient Hospital Stay: Payer: Medicare PPO

## 2021-05-07 ENCOUNTER — Inpatient Hospital Stay: Payer: Medicare PPO | Attending: Hematology

## 2021-05-07 ENCOUNTER — Other Ambulatory Visit: Payer: Self-pay

## 2021-05-07 VITALS — BP 139/68 | HR 72 | Temp 98.0°F | Resp 16

## 2021-05-07 DIAGNOSIS — G35 Multiple sclerosis: Secondary | ICD-10-CM | POA: Insufficient documentation

## 2021-05-07 DIAGNOSIS — M069 Rheumatoid arthritis, unspecified: Secondary | ICD-10-CM | POA: Insufficient documentation

## 2021-05-07 DIAGNOSIS — D508 Other iron deficiency anemias: Secondary | ICD-10-CM

## 2021-05-07 DIAGNOSIS — R600 Localized edema: Secondary | ICD-10-CM | POA: Diagnosis not present

## 2021-05-07 DIAGNOSIS — I10 Essential (primary) hypertension: Secondary | ICD-10-CM | POA: Insufficient documentation

## 2021-05-07 DIAGNOSIS — D649 Anemia, unspecified: Secondary | ICD-10-CM | POA: Insufficient documentation

## 2021-05-07 DIAGNOSIS — R0602 Shortness of breath: Secondary | ICD-10-CM | POA: Diagnosis not present

## 2021-05-07 MED ORDER — SODIUM CHLORIDE 0.9 % IV SOLN
300.0000 mg | Freq: Once | INTRAVENOUS | Status: AC
  Administered 2021-05-07: 300 mg via INTRAVENOUS
  Filled 2021-05-07: qty 300

## 2021-05-07 MED ORDER — SODIUM CHLORIDE 0.9 % IV SOLN: Freq: Once | INTRAVENOUS | Status: AC

## 2021-05-07 MED ORDER — LORATADINE 10 MG PO TABS
10.0000 mg | ORAL_TABLET | Freq: Once | ORAL | Status: AC
  Administered 2021-05-07: 10 mg via ORAL
  Filled 2021-05-07: qty 1

## 2021-05-07 MED ORDER — ACETAMINOPHEN 325 MG PO TABS
650.0000 mg | ORAL_TABLET | Freq: Once | ORAL | Status: AC
  Administered 2021-05-07: 650 mg via ORAL
  Filled 2021-05-07: qty 2

## 2021-05-07 NOTE — Patient Instructions (Signed)

## 2021-05-09 ENCOUNTER — Other Ambulatory Visit (HOSPITAL_COMMUNITY): Payer: Self-pay

## 2021-05-13 NOTE — Progress Notes (Unsigned)
Office Visit Note  Patient: Michele Bryan             Date of Birth: Jun 10, 1940           MRN: [id number]             PCP: Kathyrn Lass, MD Referring: Kathyrn Lass, MD Visit Date: 05/27/2021 Occupation: @GUAROCC @  Subjective:  No chief complaint on file.   History of Present Illness: Michele Bryan is a 81 y.o. female ***   Activities of Daily Living:  Patient reports morning stiffness for *** {minute/hour:19697}.   Patient {ACTIONS;DENIES/REPORTS:21021675::"Denies"} nocturnal pain.  Difficulty dressing/grooming: {ACTIONS;DENIES/REPORTS:21021675::"Denies"} Difficulty climbing stairs: {ACTIONS;DENIES/REPORTS:21021675::"Denies"} Difficulty getting out of chair: {ACTIONS;DENIES/REPORTS:21021675::"Denies"} Difficulty using hands for taps, buttons, cutlery, and/or writing: {ACTIONS;DENIES/REPORTS:21021675::"Denies"}  No Rheumatology ROS completed.   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  05/06/2016   High risk medication use 05/06/2016   Primary osteoarthritis of both hands 05/06/2016   Primary osteoarthritis of left knee 05/06/2016   Primary osteoarthritis of both feet 05/06/2016   Dyslipidemia 05/06/2016   Peripheral neuropathy 05/06/2016   Diverticulosis of intestine without bleeding 05/06/2016   Osteopenia  05/06/2016   Vitamin D deficiency  05/06/2016   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   History of GI bleed 05/06/2016   Cataract of both eyes 05/06/2016   Urinary urgency 04/30/2015   Chronic constipation 04/30/2015   MS (multiple sclerosis) ([place]) 03/23/2013   Expected blood loss anemia 06/16/2012   S/P right TKA 06/15/2012    Past Medical History:  Diagnosis Date   Anemia    Arthritis    GERD (gastroesophageal reflux disease)    History of blood transfusion    Hyperlipidemia    Hypertension    Lower extremity edema    MS (multiple sclerosis) (HCC)    Neuromuscular disorder (HCC)    multiple scleroosis/peripheral neuropathy   PONV (postoperative nausea and vomiting)    Rheumatoid arthritis ([place])    Ulcer    Vitamin D deficiency     Family History  Problem Relation Age of Onset   Heart attack Father    Breast cancer Neg Hx    Past Surgical History:  Procedure Laterality Date   APPLICATION OF INTRAOPERATIVE CT SCAN N/A 02/16/2020   Procedure: APPLICATION OF INTRAOPERATIVE CT SCAN;  Surgeon: Judith Part, MD;  Location: [facility];  Service: Neurosurgery;  Laterality: N/A;   AUGMENTATION MAMMAPLASTY     BACK SURGERY     breast augumentation     BREAST SURGERY     biopsy   COLONOSCOPY     DILATION AND CURETTAGE OF UTERUS     EYE SURGERY     both eys,cataracts   goiter     HALO APPLICATION N/A [id number]   Procedure: HALO TRACTION APPLICATION;  Surgeon: Judith Part, MD;  Location: [facility];  Service: Neurosurgery;  Laterality: N/A;   JOINT REPLACEMENT     KNEE ARTHROSCOPY Right    ORIF SHOULDER FRACTURE Left 03/05/2018   Procedure: OPEN REDUCTION INTERNAL FIXATION (ORIF) LEFT SCAPULA;  Surgeon: Netta Cedars, MD;  Location: [facility];  Service: Orthopedics;  Laterality: Left;   POSTERIOR CERVICAL FUSION/FORAMINOTOMY N/A 12/06/2019   Procedure: Cervical one-two Posterior instrumented fusion;  Surgeon: Judith Part, MD;  Location: [facility];  Service: Neurosurgery;  Laterality: N/A;    POSTERIOR CERVICAL FUSION/FORAMINOTOMY N/A 02/13/2020   Procedure: Revision of cervical instrumented fusion with Occiput to Cervical Four posterior instrumented fusion;  Surgeon: Judith Part, MD;  Location: [facility];  Service: Neurosurgery;  Laterality: N/A;  posterior   POSTERIOR CERVICAL FUSION/FORAMINOTOMY N/A 02/16/2020   Procedure: Occiput to Thoracic Two Posterior Cervical Fusion with AIRO and Application of Halo;  Surgeon: Judith Part, MD;  Location: [facility];  Service: Neurosurgery;  Laterality: N/A;   REVERSE SHOULDER ARTHROPLASTY Left 05/15/2017   Procedure: LEFT REVERSE SHOULDER ARTHROPLASTY;  Surgeon: Netta Cedars, MD;  Location: [facility];  Service: Orthopedics;  Laterality: Left;   REVERSE SHOULDER ARTHROPLASTY Left 10/11/2018   Procedure: left shoulder irrigation and debridement, open poly exchange and removal of painful hardware;  Surgeon: Netta Cedars, MD;  Location: [facility];  Service: Orthopedics;  Laterality: Left;   SHOULDER HEMI-ARTHROPLASTY Left 12/11/2020   Procedure: Left reverse shoulder conversion to SHOULDER HEMI-ARTHROPLASTY;  Surgeon: Netta Cedars, MD;  Location: [facility];  Service: Orthopedics;  Laterality: Left;  with ISB   THYROID LOBECTOMY     TOTAL KNEE ARTHROPLASTY Right 06/15/2012   Procedure: RIGHT TOTAL KNEE ARTHROPLASTY;  Surgeon: Mauri Pole, MD;  Location: [facility];  Service: Orthopedics;  Laterality: Right;   WRIST SURGERY Left    Social History   Social History Narrative   Patient lives at home with her husband Michele Bryan).   Retired.   Education- college.   Caffeine- Two cups of decaf tea daily.   Right handed.         Immunization History  Administered Date(s) Administered   Influenza, High Dose Seasonal PF 12/03/2013   Influenza,inj,quad, With Preservative 11/23/2014   Influenza-Unspecified 12/19/2017   PFIZER(Purple Top)SARS-COV-2 Vaccination 04/16/2019, 05/29/2019, 01/09/2020   Td 12/15/2017   Zoster Recombinat (Shingrix) 08/10/2017,  01/05/2018     Objective: Vital Signs: There were no vitals taken for this visit.   Physical Exam   Musculoskeletal Exam: ***  CDAI Exam: CDAI Score: -- Patient Global: --; Provider Global: -- Swollen: --; Tender: -- Joint Exam 05/27/2021   No joint exam has been documented for this visit   There is currently no information documented on the homunculus. Go to the Rheumatology activity and complete the homunculus joint exam.  Investigation: No additional findings.  Imaging: No results found.  Recent Labs: Lab Results  Component Value Date   WBC 5.9 03/06/2021   HGB 10.3 (L) 03/06/2021   PLT 218 03/06/2021   NA 138 03/06/2021   K 4.5 03/06/2021   CL 105 03/06/2021   CO2 25 03/06/2021   GLUCOSE 78 03/06/2021   BUN 19 03/06/2021   CREATININE 0.81 03/06/2021   BILITOT 0.9 03/06/2021   ALKPHOS 100 03/06/2021   AST 16 03/06/2021   ALT 11 03/06/2021   PROT 7.1 03/06/2021   ALBUMIN 4.3 03/06/2021   CALCIUM 9.3 03/06/2021   GFRAA 98 04/03/2020   QFTBGOLDPLUS NEGATIVE 02/18/2021    Speciality Comments: PLQ Eye Exam:12/06/2020 WNL@ [facility] Osteoporosis managed by PCP-ACY 10/12/2018 Rinvoq-diarrhea,  Xeljanz-12/19/20  Procedures:  No procedures performed Allergies: Meperidine hcl, Codeine, and Demerol [meperidine]   Assessment / Plan:     Visit Diagnoses: No diagnosis found.  Orders: No orders of the defined types were placed in this encounter.  No orders of the defined types were placed in this encounter.   Face-to-face time spent with patient was *** minutes. Greater than 50% of time was spent in counseling and coordination of care.  Follow-Up Instructions: No follow-ups on file.   Earnestine Mealing, CMA  Note - This record has been created using Editor, commissioning.  Chart creation errors have been sought, but may not always  have been located. Such creation errors do not reflect on  the standard of medical care.

## 2021-05-14 ENCOUNTER — Other Ambulatory Visit: Payer: Self-pay

## 2021-05-14 ENCOUNTER — Other Ambulatory Visit (HOSPITAL_COMMUNITY): Payer: Self-pay

## 2021-05-14 ENCOUNTER — Inpatient Hospital Stay: Payer: Medicare PPO

## 2021-05-14 VITALS — BP 157/84 | HR 69 | Temp 97.6°F | Resp 18 | Wt 118.0 lb

## 2021-05-14 DIAGNOSIS — R0602 Shortness of breath: Secondary | ICD-10-CM | POA: Diagnosis not present

## 2021-05-14 DIAGNOSIS — D649 Anemia, unspecified: Secondary | ICD-10-CM | POA: Diagnosis not present

## 2021-05-14 DIAGNOSIS — G35 Multiple sclerosis: Secondary | ICD-10-CM | POA: Diagnosis not present

## 2021-05-14 DIAGNOSIS — R600 Localized edema: Secondary | ICD-10-CM | POA: Diagnosis not present

## 2021-05-14 DIAGNOSIS — I10 Essential (primary) hypertension: Secondary | ICD-10-CM | POA: Diagnosis not present

## 2021-05-14 DIAGNOSIS — D508 Other iron deficiency anemias: Secondary | ICD-10-CM

## 2021-05-14 DIAGNOSIS — M069 Rheumatoid arthritis, unspecified: Secondary | ICD-10-CM | POA: Diagnosis not present

## 2021-05-14 MED ORDER — ACETAMINOPHEN 325 MG PO TABS: 650.0000 mg | ORAL_TABLET | Freq: Once | ORAL | Status: DC

## 2021-05-14 MED ORDER — SODIUM CHLORIDE 0.9 % IV SOLN: Freq: Once | INTRAVENOUS | Status: AC

## 2021-05-14 MED ORDER — SODIUM CHLORIDE 0.9 % IV SOLN
300.0000 mg | Freq: Once | INTRAVENOUS | Status: AC
  Administered 2021-05-14: 300 mg via INTRAVENOUS
  Filled 2021-05-14: qty 300

## 2021-05-14 MED ORDER — LORATADINE 10 MG PO TABS
10.0000 mg | ORAL_TABLET | Freq: Once | ORAL | Status: AC
  Administered 2021-05-14: 10 mg via ORAL
  Filled 2021-05-14: qty 1

## 2021-05-14 NOTE — Patient Instructions (Signed)

## 2021-05-14 NOTE — Progress Notes (Signed)
Patient took Tylenol at home around 1:45pm this afternoon. Premedication for Tylenol marked as not given.  ?

## 2021-05-19 NOTE — Progress Notes (Signed)
? ?ADVANCED HF CLINIC NOTE ? ?Referring Physician: Dr. Hyacinth MeekerMiller  ?Primary Care: Sigmund HazelMiller, Lisa, MD ?Primary Cardiologist: None ? ? ?HPI: ? ?Michele Bryan is a 81 y.o. female with history of HTN, MS, RA, anemia, previous C1/C2 fracture. Referred by Dr. Hyacinth MeekerMiller for further evaluation of LE edema.  ? ?She had fall with C1/C2 fracture in 2021. Had fusion on 12/06/19. 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 Maurice Smallstergard in 12/21. Transferred to CIR ? ?We saw her for LE edema in 6/22 and started prn lasix. Echo ok. Takes lasix about 1x/week. Feels much better. No SOB, orthopnea or PND. Still struggling with arthitis.  ? ?Had left shoulder replaced 10/22 ? ?Last seen for f/u 09/22. ReDS 40%. Recommended scheduled lasix 1X/week. ? ?Here today for follow-up. Overall doing ok. Very mild LE. No SOB, orthopnea or PND. Struggling with arthritis pain.  ? ? ? ?Echo 6/22 EF 60-65% G2DD RV ok. Calcified Aov No AS. Personally reviewed ? ? ? ?Past Medical History:  ?Diagnosis Date  ? Anemia   ? Arthritis   ? GERD (gastroesophageal reflux disease)   ? History of blood transfusion   ? Hyperlipidemia   ? Hypertension   ? Lower extremity edema   ? MS (multiple sclerosis) (HCC)   ? Neuromuscular disorder (HCC)   ? multiple scleroosis/peripheral neuropathy  ? PONV (postoperative nausea and vomiting)   ? Rheumatoid arthritis (HCC)   ? Ulcer   ? Vitamin D deficiency   ? ? ?Current Outpatient Medications  ?Medication Sig Dispense Refill  ? acetaminophen (TYLENOL) 650 MG CR tablet Take 1,300 mg by mouth daily. Take additional  1,300 mg of needed in the evening    ? alendronate (FOSAMAX) 70 MG tablet Take 70 mg by mouth once a week. Tuesday    ? amLODipine (NORVASC) 5 MG tablet Take 1 tablet (5 mg total) by mouth daily. 30 tablet 0  ? Cyanocobalamin (VITAMIN B-12) 5000 MCG TBDP Take 5,000 mcg by mouth daily. 30 tablet 0  ? furosemide  (LASIX) 20 MG tablet Take 1 tablet (20 mg total) by mouth daily as needed (as needed for swelling). 15 tablet 3  ? gabapentin (NEURONTIN) 100 MG capsule Take 1 capsule (100 mg total) by mouth 3 (three) times daily. 90 capsule 11  ? hydroxychloroquine (PLAQUENIL) 200 MG tablet TAKE ONE TABLET BY MOUTH ONE TIME DAILY 90 tablet 0  ? hyoscyamine (LEVBID) 0.375 MG 12 hr tablet Take 0.375 mg by mouth every 12 (twelve) hours as needed for cramping.    ? Insulin Pen Needle 31G X 5 MM MISC USE TO INJECT FORTEO ONCE DAILY. DO NOT RE-USE. 100 each 3  ? losartan (COZAAR) 50 MG tablet Take 50 mg by mouth 2 (two) times daily.    ? nystatin cream (MYCOSTATIN) Apply 1 application topically daily as needed.    ? omeprazole (PRILOSEC) 40 MG capsule Take 1 capsule (40 mg total) by mouth daily. 30 capsule 0  ? ondansetron (ZOFRAN) 4 MG tablet Take 4 mg by mouth every 8 (eight) hours as needed for nausea or vomiting.    ? polyethylene glycol (MIRALAX / GLYCOLAX) 17 g packet Take 17 g by mouth as needed.    ? potassium chloride SA (KLOR-CON) 20 MEQ tablet Take 1 tablet (20 mEq total) by mouth as needed (when you take Furosemide). 15 tablet 3  ? rosuvastatin (CRESTOR) 5 MG tablet  Take 5 mg by mouth daily.    ? Teriparatide, Recombinant, (FORTEO) 600 MCG/2.4ML SOPN Inject 20 mcg into the skin daily. Discard after 28 days 2.4 mL 5  ? Tofacitinib Citrate (XELJANZ PO) Take 5 mg by mouth daily.    ? traMADol (ULTRAM) 50 MG tablet Take 1 tablet (50 mg total) by mouth every 6 (six) hours as needed for moderate pain. 30 tablet 0  ? zolpidem (AMBIEN) 10 MG tablet Take 10 mg by mouth at bedtime as needed for sleep.    ? ?No current facility-administered medications for this encounter.  ? ? ?Allergies  ?Allergen Reactions  ? Meperidine Hcl   ?  Other reaction(s): sick nausea/vomiting  ? Codeine Nausea And Vomiting  ?  Other reaction(s): sick/ nausea/vomiting  ? Demerol [Meperidine] Nausea And Vomiting  ? ? ?  ?Social History  ? ?Socioeconomic  History  ? Marital status: Married  ?  Spouse name: Michele Bryan  ? Number of children: 3  ? Years of education: college  ? Highest education level: Not on file  ?Occupational History  ?  Comment: retired  ?Tobacco Use  ? Smoking status: Never  ? Smokeless tobacco: Never  ?Vaping Use  ? Vaping Use: Never used  ?Substance and Sexual Activity  ? Alcohol use: No  ? Drug use: Never  ? Sexual activity: Not on file  ?Other Topics Concern  ? Not on file  ?Social History Narrative  ? Patient lives at home with her husband Michele Bryan).  ? Retired.  ? Education- college.  ? Caffeine- Two cups of decaf tea daily.  ? Right handed.  ?   ?   ? ?Social Determinants of Health  ? ?Financial Resource Strain: Not on file  ?Food Insecurity: Not on file  ?Transportation Needs: Not on file  ?Physical Activity: Not on file  ?Stress: Not on file  ?Social Connections: Not on file  ?Intimate Partner Violence: Not on file  ? ? ?  ?Family History  ?Problem Relation Age of Onset  ? Heart attack Father   ? Breast cancer Neg Hx   ? ? ?Vitals:  ? 05/20/21 1350  ?BP: 140/80  ?Pulse: 84  ?SpO2: 98%  ?Weight: 52.6 kg  ? ? ? ?PHYSICAL EXAM: ?General:  Elderly  No resp difficulty ?HEENT: normal ?Neck: limited ROM  no JVD. Carotids 2+ bilat; no bruits. No lymphadenopathy or thryomegaly appreciated. ?Cor: PMI nondisplaced. Regular rate & rhythm. No rubs, gallops or murmurs. ?Lungs: clear ?Abdomen: soft, nontender, nondistended. No hepatosplenomegaly. No bruits or masses. Good bowel sounds. ?Extremities: no cyanosis, clubbing, rash, tr-1+ edema severe arthritic changes ?Neuro: alert & orientedx3, cranial nerves grossly intact. moves all 4 extremities w/o difficulty. Affect pleasant ? ? ?ASSESSMENT & PLAN: ? ?Lower extremity edema ?- Echo 06/22: EF 60-65% G2DD RV ok. Calcified Aov No AS. Personally reviewed ?- Mild LE edema.  ?- Suspect mix of diastolic HF and venous insufficiency. BNP has been normal  ?- No spiro or ARNI with hyperkalemia ?- LE edema and SOB much  improved with compression hose and PRN lasix. Encouraged her to use compression socks as needed ? ?2. Calcified AoV ?- no significant AS.  ?- Will follow  ?- no change  ? ?Arvilla Meres, MD  ?2:04 PM ? ? ?

## 2021-05-20 ENCOUNTER — Other Ambulatory Visit: Payer: Self-pay

## 2021-05-20 ENCOUNTER — Encounter (HOSPITAL_COMMUNITY): Payer: Self-pay | Admitting: Internal Medicine

## 2021-05-20 ENCOUNTER — Inpatient Hospital Stay: Payer: Medicare PPO

## 2021-05-20 ENCOUNTER — Ambulatory Visit (HOSPITAL_BASED_OUTPATIENT_CLINIC_OR_DEPARTMENT_OTHER)
Admission: RE | Admit: 2021-05-20 | Discharge: 2021-05-20 | Disposition: A | Discharge status: Home / Self Care | Payer: Medicare PPO | Source: Ambulatory Visit | Attending: Internal Medicine | Admitting: Internal Medicine

## 2021-05-20 VITALS — BP 140/80 | HR 84 | Wt 116.0 lb

## 2021-05-20 VITALS — BP 154/77 | HR 66 | Temp 97.7°F | Resp 17

## 2021-05-20 DIAGNOSIS — R609 Edema, unspecified: Secondary | ICD-10-CM

## 2021-05-20 DIAGNOSIS — G35 Multiple sclerosis: Secondary | ICD-10-CM | POA: Diagnosis not present

## 2021-05-20 DIAGNOSIS — D649 Anemia, unspecified: Secondary | ICD-10-CM | POA: Insufficient documentation

## 2021-05-20 DIAGNOSIS — D508 Other iron deficiency anemias: Secondary | ICD-10-CM

## 2021-05-20 DIAGNOSIS — I359 Nonrheumatic aortic valve disorder, unspecified: Secondary | ICD-10-CM

## 2021-05-20 DIAGNOSIS — M069 Rheumatoid arthritis, unspecified: Secondary | ICD-10-CM | POA: Insufficient documentation

## 2021-05-20 DIAGNOSIS — I10 Essential (primary) hypertension: Secondary | ICD-10-CM | POA: Insufficient documentation

## 2021-05-20 DIAGNOSIS — R600 Localized edema: Secondary | ICD-10-CM | POA: Insufficient documentation

## 2021-05-20 DIAGNOSIS — R0602 Shortness of breath: Secondary | ICD-10-CM | POA: Insufficient documentation

## 2021-05-20 MED ORDER — ACETAMINOPHEN 325 MG PO TABS
650.0000 mg | ORAL_TABLET | Freq: Once | ORAL | Status: AC
  Administered 2021-05-20: 650 mg via ORAL
  Filled 2021-05-20: qty 2

## 2021-05-20 MED ORDER — LORATADINE 10 MG PO TABS
10.0000 mg | ORAL_TABLET | Freq: Once | ORAL | Status: AC
  Administered 2021-05-20: 10 mg via ORAL
  Filled 2021-05-20: qty 1

## 2021-05-20 MED ORDER — SODIUM CHLORIDE 0.9 % IV SOLN: Freq: Once | INTRAVENOUS | Status: AC

## 2021-05-20 MED ORDER — SODIUM CHLORIDE 0.9 % IV SOLN
300.0000 mg | Freq: Once | INTRAVENOUS | Status: AC
  Administered 2021-05-20: 300 mg via INTRAVENOUS
  Filled 2021-05-20: qty 300

## 2021-05-20 NOTE — Patient Instructions (Signed)
Your physician recommends that you schedule a follow-up appointment in: 1 year, **PLEASE CALL OUR OFFICE IN January 2024 TO SCHEDULE THIS APPOINTMENT ? ?If you have any questions or concerns before your next appointment please send [place] a message through [place] or call our office at [phone].   ? ?TO LEAVE A MESSAGE FOR THE NURSE SELECT OPTION 2, PLEASE LEAVE A MESSAGE INCLUDING: ?YOUR NAME ?DATE OF BIRTH ?CALL BACK NUMBER ?REASON FOR CALL**this is important as we prioritize the call backs ? ?YOU WILL RECEIVE A CALL BACK THE SAME DAY AS LONG AS YOU CALL BEFORE 4:00 PM ? ? ?

## 2021-05-20 NOTE — Addendum Note (Signed)
Encounter addended by: Noralee SpaceSchub, Brookes Craine M, RN on: 05/20/2021 2:11 PM ? Actions taken: Clinical Note Signed

## 2021-05-20 NOTE — Patient Instructions (Signed)

## 2021-05-22 ENCOUNTER — Other Ambulatory Visit: Payer: Self-pay

## 2021-05-22 ENCOUNTER — Other Ambulatory Visit: Payer: Self-pay | Admitting: Rheumatology

## 2021-05-22 ENCOUNTER — Other Ambulatory Visit (HOSPITAL_COMMUNITY): Payer: Self-pay

## 2021-05-22 ENCOUNTER — Ambulatory Visit
Admission: RE | Admit: 2021-05-22 | Discharge: 2021-05-22 | Disposition: A | Discharge status: Home / Self Care | Payer: Medicare PPO | Source: Ambulatory Visit | Attending: Neurological Surgery | Admitting: Neurological Surgery

## 2021-05-22 DIAGNOSIS — Z79899 Other long term (current) drug therapy: Secondary | ICD-10-CM

## 2021-05-22 DIAGNOSIS — M4322 Fusion of spine, cervical region: Secondary | ICD-10-CM | POA: Diagnosis not present

## 2021-05-22 DIAGNOSIS — Z981 Arthrodesis status: Secondary | ICD-10-CM | POA: Diagnosis not present

## 2021-05-22 DIAGNOSIS — M0579 Rheumatoid arthritis with rheumatoid factor of multiple sites without organ or systems involvement: Secondary | ICD-10-CM

## 2021-05-22 DIAGNOSIS — M4312 Spondylolisthesis, cervical region: Secondary | ICD-10-CM | POA: Diagnosis not present

## 2021-05-22 DIAGNOSIS — S12111A Posterior displaced Type II dens fracture, initial encounter for closed fracture: Secondary | ICD-10-CM | POA: Diagnosis not present

## 2021-05-22 DIAGNOSIS — S129XXA Fracture of neck, unspecified, initial encounter: Secondary | ICD-10-CM

## 2021-05-22 MED ORDER — XELJANZ 5 MG PO TABS
5.0000 mg | ORAL_TABLET | Freq: Two times a day (BID) | ORAL | 2 refills | Status: DC
  Filled 2021-05-22: qty 60, 30d supply, fill #0

## 2021-05-22 NOTE — Telephone Encounter (Signed)
Next Visit: 05/27/2021 ?  ?Last Visit: 03/19/2021 ?  ?DX: Rheumatoid arthritis involving multiple sites with positive rheumatoid factor ?  ?Current Dose per phone note on 03/20/2021: After discussion with Dr Estanislado Pandy, we will send rx for Morrie Sheldon to be written 5mg  twice daily to allow pharmacy to fill. Patient will continue taking once daily until f/u visit discussion. ?  ?Labs: 03/06/2021 RBC 3.83, Hgb 10.3, Hct 33.4 ? ?Okay to refill Morrie Sheldon?  ?  ?

## 2021-05-23 ENCOUNTER — Ambulatory Visit: Payer: Medicare PPO | Admitting: Physician Assistant

## 2021-05-27 ENCOUNTER — Encounter: Payer: Self-pay | Admitting: Physician Assistant

## 2021-05-27 ENCOUNTER — Ambulatory Visit: Payer: Medicare PPO | Admitting: Physician Assistant

## 2021-05-27 ENCOUNTER — Telehealth: Payer: Self-pay | Admitting: Pharmacist

## 2021-05-27 ENCOUNTER — Other Ambulatory Visit: Payer: Self-pay

## 2021-05-27 VITALS — BP 157/76 | HR 66 | Ht 62.0 in | Wt 118.4 lb

## 2021-05-27 DIAGNOSIS — G35 Multiple sclerosis: Secondary | ICD-10-CM

## 2021-05-27 DIAGNOSIS — M1712 Unilateral primary osteoarthritis, left knee: Secondary | ICD-10-CM

## 2021-05-27 DIAGNOSIS — E785 Hyperlipidemia, unspecified: Secondary | ICD-10-CM

## 2021-05-27 DIAGNOSIS — S129XXS Fracture of neck, unspecified, sequela: Secondary | ICD-10-CM

## 2021-05-27 DIAGNOSIS — M19042 Primary osteoarthritis, left hand: Secondary | ICD-10-CM

## 2021-05-27 DIAGNOSIS — Z8719 Personal history of other diseases of the digestive system: Secondary | ICD-10-CM

## 2021-05-27 DIAGNOSIS — M19071 Primary osteoarthritis, right ankle and foot: Secondary | ICD-10-CM

## 2021-05-27 DIAGNOSIS — Z96651 Presence of right artificial knee joint: Secondary | ICD-10-CM

## 2021-05-27 DIAGNOSIS — Z8669 Personal history of other diseases of the nervous system and sense organs: Secondary | ICD-10-CM

## 2021-05-27 DIAGNOSIS — M0579 Rheumatoid arthritis with rheumatoid factor of multiple sites without organ or systems involvement: Secondary | ICD-10-CM

## 2021-05-27 DIAGNOSIS — M19041 Primary osteoarthritis, right hand: Secondary | ICD-10-CM | POA: Diagnosis not present

## 2021-05-27 DIAGNOSIS — M8589 Other specified disorders of bone density and structure, multiple sites: Secondary | ICD-10-CM

## 2021-05-27 DIAGNOSIS — M19072 Primary osteoarthritis, left ankle and foot: Secondary | ICD-10-CM

## 2021-05-27 DIAGNOSIS — Z96612 Presence of left artificial shoulder joint: Secondary | ICD-10-CM | POA: Diagnosis not present

## 2021-05-27 DIAGNOSIS — Z79899 Other long term (current) drug therapy: Secondary | ICD-10-CM | POA: Diagnosis not present

## 2021-05-27 DIAGNOSIS — Z8639 Personal history of other endocrine, nutritional and metabolic disease: Secondary | ICD-10-CM

## 2021-05-27 DIAGNOSIS — M4004 Postural kyphosis, thoracic region: Secondary | ICD-10-CM | POA: Diagnosis not present

## 2021-05-27 NOTE — Telephone Encounter (Signed)
Please start Olumiant BIV. ? ?Dose: 2mg  once daily ? ?Dx: Rheumatoid arthritis (M05.9) ? ?Previously tried therapies: ?Orencia - 2019 through July 2022 ?Rinvoq x 9 days - diarrhea, GI side effects ?Hydroxychloroquine - inadequate clinical response ?Harriette Ohara (current) - GI side effects ?  ?Therapies patient unable to try: ?TNF inhibitors - personal history of MS ?Actemra/Kevzara - personal history of MS ? ?Rx will be pending labs drawn at [facility] today ? ?Chesley Mires, PharmD, MPH, BCPS ?Clinical Pharmacist (Rheumatology and Pulmonology) ? ?

## 2021-05-27 NOTE — Progress Notes (Signed)
Pharmacy Note ? ?Subjective: ?Patient presents today to [facility] for follow up office visit.   Patient seen by the pharmacist for counseling on Olumiant for rheumatoid arthritis.  Previous medications include: Rinvoq (stopped d/t diarrhea. Diarrhea resolved after 2 days of d/c). Prior to Rinvoq, she was taking Orencia since September 2019 and had waning clinical response. Rinvoq was switchde to [place] at low dose for tolerability (patient reported GI side effects). ? ?History of diverticulitis:  No. Was cleared by GI to start JAK inhibitors given history of diverticulosis ? ?History of MI, stroke, or CV events:  No.   Her father died from MI when he was 70 y/o but no personal or family history of stroke, MI, DVT, PE ? ?Objective: ? ? ?CMP  ?   ?Component Value Date/Time  ? NA 138 03/06/2021 1219  ? K 4.5 03/06/2021 1219  ? CL 105 03/06/2021 1219  ? CO2 25 03/06/2021 1219  ? GLUCOSE 78 03/06/2021 1219  ? BUN 19 03/06/2021 1219  ? CREATININE 0.81 03/06/2021 1219  ? CREATININE 0.63 12/06/2020 1130  ? CALCIUM 9.3 03/06/2021 1219  ? PROT 7.1 03/06/2021 1219  ? ALBUMIN 4.3 03/06/2021 1219  ? AST 16 03/06/2021 1219  ? ALT 11 03/06/2021 1219  ? ALKPHOS 100 03/06/2021 1219  ? ? ?CBC ?   ?Component Value Date/Time  ? WBC 5.9 03/06/2021 1219  ? RBC 3.81 (L) 03/06/2021 1220  ? RBC 3.83 (L) 03/06/2021 1219  ? HGB 10.3 (L) 03/06/2021 1219  ? HCT 31.5 (L) 03/06/2021 1220  ? HCT 33.4 (L) 03/06/2021 1219  ? PLT 218 03/06/2021 1219  ? MCV 87.2 03/06/2021 1219  ? MCH 26.9 03/06/2021 1219  ? MCHC 30.8 03/06/2021 1219  ? RDW 15.5 03/06/2021 1219  ? ? ?Baseline Immunosuppressant Therapy Labs ?TB GOLD ? ?  Latest Ref Rng & Units 02/18/2021  ?  1:47 PM  ?Quantiferon TB Gold  ?Quantiferon TB Gold Plus NEGATIVE NEGATIVE    ? ?Hepatitis Panel ?  ?HIV ?Lab Results  ?Component Value Date  ? HIV NON-REACTIVE 03/19/2017  ? ?Immunoglobulins ? ?  Latest Ref Rng & Units 03/06/2021  ? 12:20 PM  ?Immunoglobulin Electrophoresis  ?IgG 586  - 1,602 mg/dL 782    ?IgM 26 - 217 mg/dL 74    ? ?SPEP ? ?  Latest Ref Rng & Units 03/06/2021  ? 12:19 PM  ?Serum Protein Electrophoresis  ?Total Protein 6.5 - 8.1 g/dL 7.1    ? ? ?LIPIDS ?Lipid panel WNL on 02/11/21  ? ? ?Assessment/Plan: ? ?Counseled patient that Olumiant is a JAK inhibitor indicated for Rheumatoid Arthritis.  Counseled patient on purpose, proper use, and adverse effects of Olumiant.    Reviewed the most common adverse effects including infection, diarrhea, headaches.  Also reviewed rare adverse effects such as bowel injury and the need to contact us if they develop stomach pain during treatment. Counseled on the increase risk of venous thrombosis. Counseled about FDA black box warning of MACE (major adverse CV events including cardiovascular death, myocardial infarction, and stroke).  Reviewed with patient that there is the possibility of an increased risk of malignancy specifically lung cancer and lymphomas but it is not well understood if this increased risk is due to the medication or the disease state. Instructed patient that medication should be held for infection and prior to surgery.  Advised patient to avoid live vaccines. Recommend annual influenza, PCV 15 or PCV20 or Pneumovax 23, and Shingrix as indicated.   ? ?  Reviewed importance of routine lab monitoring including lipid panel.  Will recheck lipid panel 3 months after starting and annually thereafter. CBC and CMP will be monitored routinely every 3 months.  Standing orders placed. Provided patient with medication education material and answered all questions.  Patient consented to Olumiant.  Will upload into patient's chart.   ? ?Patient dose will be Olumiant 2 mg daily.  Prescription will be sent to pharmacy pending lab results and/or insurance approval. ? ?Chesley Mires, PharmD, MPH, BCPS ?Clinical Pharmacist (Rheumatology and Pulmonology) ?

## 2021-05-28 ENCOUNTER — Other Ambulatory Visit (HOSPITAL_COMMUNITY): Payer: Self-pay

## 2021-05-28 LAB — COMPLETE METABOLIC PANEL WITH GFR
AG Ratio: 1.9 (calc) (ref 1.0–2.5)
ALT: 11 U/L (ref 6–29)
AST: 19 U/L (ref 10–35)
Albumin: 4.2 g/dL (ref 3.6–5.1)
Alkaline phosphatase (APISO): 79 U/L (ref 37–153)
BUN: 17 mg/dL (ref 7–25)
CO2: 25 mmol/L (ref 20–32)
Calcium: 9.2 mg/dL (ref 8.6–10.4)
Chloride: 108 mmol/L (ref 98–110)
Creat: 0.69 mg/dL (ref 0.60–1.00)
Globulin: 2.2 g/dL (calc) (ref 1.9–3.7)
Glucose, Bld: 87 mg/dL (ref 65–99)
Potassium: 4.2 mmol/L (ref 3.5–5.3)
Sodium: 140 mmol/L (ref 135–146)
Total Bilirubin: 0.8 mg/dL (ref 0.2–1.2)
Total Protein: 6.4 g/dL (ref 6.1–8.1)
eGFR: 89 mL/min/{1.73_m2} (ref >=60)

## 2021-05-28 LAB — CBC WITH DIFFERENTIAL/PLATELET
Absolute Monocytes: 679 cells/uL (ref 200–950)
Basophils Absolute: 41 cells/uL (ref 0–200)
Basophils Relative: 0.7 %
Eosinophils Absolute: 180 cells/uL (ref 15–500)
Eosinophils Relative: 3.1 %
HCT: 35.3 % (ref 35.0–45.0)
Hemoglobin: 11.1 g/dL — ABNORMAL LOW (ref 11.7–15.5)
Lymphs Abs: 1380 cells/uL (ref 850–3900)
MCH: 28.6 pg (ref 27.0–33.0)
MCHC: 31.4 g/dL — ABNORMAL LOW (ref 32.0–36.0)
MCV: 91 fL (ref 80.0–100.0)
MPV: 9.8 fL (ref 7.5–12.5)
Monocytes Relative: 11.7 %
Neutro Abs: 3521 cells/uL (ref 1500–7800)
Neutrophils Relative %: 60.7 %
Platelets: 181 10*3/uL (ref 140–400)
RBC: 3.88 10*6/uL (ref 3.80–5.10)
RDW: 14.6 % (ref 11.0–15.0)
Total Lymphocyte: 23.8 %
WBC: 5.8 10*3/uL (ref 3.8–10.8)

## 2021-05-28 NOTE — Progress Notes (Signed)
CMP WNL. Hgb is borderline low-11.1-improved.  Rest of CBC WNL.

## 2021-05-28 NOTE — Telephone Encounter (Signed)
[facility] OLUMIANT. Authorization has been APPROVED from 03/03/2021 to 03/02/2022.  ? ?Per test claim, copay for 30 days supply is $11.26 ? ?[facility]: [phone]  ? ?Authorization #  [id number] ?CMM Key: [id number] ?

## 2021-05-29 ENCOUNTER — Other Ambulatory Visit (HOSPITAL_COMMUNITY): Payer: Self-pay

## 2021-05-29 MED ORDER — BARICITINIB 2 MG PO TABS
2.0000 mg | ORAL_TABLET | Freq: Every day | ORAL | 3 refills | Status: DC
  Filled 2021-05-29 – 2021-05-31 (×2): qty 30, 30d supply, fill #0
  Filled 2021-06-25 – 2021-07-02 (×2): qty 30, 30d supply, fill #1

## 2021-05-29 NOTE — Telephone Encounter (Signed)
CMP on 05/28/21 wnl. ?CBC - lymphocyte count > 500 cells/mm3, ANC > 1,000 cells/mm3, or Hb > 8 g/dL - ok to proceed with Olumiant 2mg  daily ? ?Rx sent to [facility] for Olumiant 2mg  once daily. Called patient to advise of approval and copay and that pharmacy/[facility] will be reaching out for onboarding for new medication ? ?Chesley Mires, PharmD, MPH, BCPS ?Clinical Pharmacist (Rheumatology and Pulmonology) ?

## 2021-05-30 ENCOUNTER — Other Ambulatory Visit (HOSPITAL_COMMUNITY): Payer: Self-pay

## 2021-05-31 ENCOUNTER — Other Ambulatory Visit (HOSPITAL_COMMUNITY): Payer: Self-pay

## 2021-06-03 ENCOUNTER — Other Ambulatory Visit (HOSPITAL_COMMUNITY): Payer: Self-pay

## 2021-06-05 ENCOUNTER — Other Ambulatory Visit (HOSPITAL_COMMUNITY): Payer: Self-pay

## 2021-06-25 ENCOUNTER — Other Ambulatory Visit (HOSPITAL_COMMUNITY): Payer: Self-pay

## 2021-06-27 DIAGNOSIS — S129XXA Fracture of neck, unspecified, initial encounter: Secondary | ICD-10-CM | POA: Diagnosis not present

## 2021-06-27 DIAGNOSIS — M419 Scoliosis, unspecified: Secondary | ICD-10-CM | POA: Diagnosis not present

## 2021-06-27 NOTE — Progress Notes (Signed)
? ?Office Visit Note ? ?Patient: Michele Bryan             ?Date of Birth: 11/14/1940           ?MRN: [id number]             ?PCP: Sigmund HazelMiller, Lisa, MD ?Referring: Sigmund HazelMiller, Lisa, MD ?Visit Date: 07/10/2021 ?Occupation: @GUAROCC @ ? ?Subjective:  ?Medication monitoring  ? ?History of Present Illness: Michele GinsLinda M Bryan is a 81 y.o. female with history of rheumatoid arthritis and osteoarthritis.  Patient is currently taking Olumiant and plaquenil 200 mg 1 tablet by mouth daily.  She was started on Olumiant at the end of March 2023 and has been tolerating it without any side effects.  She denies any recent infections.  She has started to notice improvement in her joint pain and inflammation on Olumiant.  She continues to recover from having the left shoulder replaced.   ?Patient is scheduled for an updated bone density today.  She remains on Forteo daily injections.  She has not had any recent falls or fractures. ? ? ? ?Activities of Daily Living:  ?Patient reports morning stiffness for several hours.   ?Patient Reports nocturnal pain.  ?Difficulty dressing/grooming: Denies ?Difficulty climbing stairs: Denies ?Difficulty getting out of chair: Reports ?Difficulty using hands for taps, buttons, cutlery, and/or writing: Reports ? ?Review of Systems  ?Constitutional:  Positive for fatigue.  ?HENT:  Negative for mouth sores, mouth dryness and nose dryness.   ?Eyes:  Negative for pain, itching and dryness.  ?Respiratory:  Negative for shortness of breath and difficulty breathing.   ?Cardiovascular:  Negative for chest pain and palpitations.  ?Gastrointestinal:  Positive for constipation and diarrhea. Negative for blood in stool.  ?Endocrine: Negative for increased urination.  ?Genitourinary:  Negative for difficulty urinating.  ?Musculoskeletal:  Positive for joint pain, joint pain, joint swelling, myalgias, morning stiffness, muscle tenderness and myalgias.  ?Skin:  Negative for color change, rash and redness.  ?Allergic/Immunologic:  Negative for susceptible to infections.  ?Neurological:  Positive for numbness. Negative for dizziness, headaches, memory loss and weakness.  ?Hematological:  Positive for bruising/bleeding tendency.  ?Psychiatric/Behavioral:  Negative for confusion.   ? ?PMFS History:  ?Patient Active Problem List  ? Diagnosis Date Noted  ? Iron deficiency anemia 04/22/2021  ? 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 Gait abnormality 03/08/2020  ? Postoperative pain   ? Benign essential HTN   ? Constipation   ? Anemia of chronic disease   ? Essential hypertension   ? Orthostatic hypotension   ? Cervical myelopathy (HCC) 02/21/2020  ? Closed cervical spine fracture (HCC) 12/06/2019  ? Scapula fracture 03/05/2018  ? Chronic left shoulder pain 11/25/2017  ? Left cervical radiculopathy 09/29/2017  ? S/P shoulder replacement, left 05/15/2017  ? 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  05/06/2016  ? High risk medication use 05/06/2016  ? Primary osteoarthritis of both hands 05/06/2016  ? Primary osteoarthritis of left knee 05/06/2016  ? Primary osteoarthritis of both feet 05/06/2016  ? Dyslipidemia 05/06/2016  ? Peripheral neuropathy 05/06/2016  ? Diverticulosis of intestine without bleeding 05/06/2016  ? Osteopenia  05/06/2016  ? Vitamin D deficiency 05/06/2016  ? 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 History of GI bleed 05/06/2016  ? Cataract of both eyes 05/06/2016  ? Urinary urgency 04/30/2015  ? Chronic constipation 04/30/2015  ? MS (multiple sclerosis) (HCC) 03/23/2013  ? Expected blood loss anemia 06/16/2012  ? S/P right  TKA 06/15/2012  ?  ?Past Medical History:  ?Diagnosis Date  ? Anemia   ? Arthritis   ? GERD (gastroesophageal reflux disease)   ? History of blood transfusion   ? Hyperlipidemia   ? Hypertension   ? Lower extremity edema   ? MS (multiple sclerosis) (HCC)   ? Neuromuscular disorder (HCC)   ? multiple scleroosis/peripheral neuropathy  ? PONV  (postoperative nausea and vomiting)   ? Rheumatoid arthritis (HCC)   ? Ulcer   ? Vitamin D deficiency   ?  ?Family History  ?Problem Relation Age of Onset  ? Heart attack Father   ? Breast cancer Neg Hx   ? ?Past Surgical History:  ?Procedure Laterality Date  ? APPLICATION OF INTRAOPERATIVE CT SCAN N/A 02/16/2020  ? Procedure: APPLICATION OF INTRAOPERATIVE CT SCAN;  Surgeon: Jadene Pierini, MD;  Location: [facility] OR;  Service: Neurosurgery;  Laterality: N/A;  ? AUGMENTATION MAMMAPLASTY    ? BACK SURGERY    ? breast augumentation    ? BREAST SURGERY    ? biopsy  ? COLONOSCOPY    ? DILATION AND CURETTAGE OF UTERUS    ? EYE SURGERY    ? both eys,cataracts  ? goiter    ? HALO APPLICATION N/A 02/16/2020  ? Procedure: HALO TRACTION APPLICATION;  Surgeon: Jadene Pierini, MD;  Location: [facility] OR;  Service: Neurosurgery;  Laterality: N/A;  ? JOINT REPLACEMENT    ? KNEE ARTHROSCOPY Right   ? ORIF SHOULDER FRACTURE Left 03/05/2018  ? Procedure: OPEN REDUCTION INTERNAL FIXATION (ORIF) LEFT SCAPULA;  Surgeon: Beverely Low, MD;  Location: [facility] OR;  Service: Orthopedics;  Laterality: Left;  ? POSTERIOR CERVICAL FUSION/FORAMINOTOMY N/A 12/06/2019  ? Procedure: Cervical one-two Posterior instrumented fusion;  Surgeon: Jadene Pierini, MD;  Location: [facility] OR;  Service: Neurosurgery;  Laterality: N/A;  ? POSTERIOR CERVICAL FUSION/FORAMINOTOMY N/A 02/13/2020  ? Procedure: Revision of cervical instrumented fusion with Occiput to Cervical Four posterior instrumented fusion;  Surgeon: Jadene Pierini, MD;  Location: [facility] OR;  Service: Neurosurgery;  Laterality: N/A;  posterior  ? POSTERIOR CERVICAL FUSION/FORAMINOTOMY N/A 02/16/2020  ? Procedure: Occiput to Thoracic Two Posterior Cervical Fusion with AIRO and Application of Halo;  Surgeon: Jadene Pierini, MD;  Location: [facility] OR;  Service: Neurosurgery;  Laterality: N/A;  ? REVERSE SHOULDER ARTHROPLASTY Left 05/15/2017  ? Procedure: LEFT REVERSE SHOULDER ARTHROPLASTY;  Surgeon: Beverely Low, MD;  Location[facility] OR;  Service: Orthopedics;  Laterality: Left;  ? REVERSE SHOULDER ARTHROPLASTY Left 10/11/2018  ? Procedure: left shoulder irrigation and debridement, open poly exchange and removal of painful hardware;  Surgeon: Beverely Low, MD;  Location: [facility] ORS;  Service: Orthopedics;  Laterality: Left;  ? SHOULDER HEMI-ARTHROPLASTY Left 12/11/2020  ? Procedure: Left reverse shoulder conversion to SHOULDER HEMI-ARTHROPLASTY;  Surgeon: Beverely Low, MD;  Location: [facility] ORS;  Service: Orthopedics;  Laterality: Left;  with ISB  ? THYROID LOBECTOMY    ? TOTAL KNEE ARTHROPLASTY Right 06/15/2012  ? Procedure: RIGHT TOTAL KNEE ARTHROPLASTY;  Surgeon: [name], MD;  Location: [facility] ORS;  Service: Orthopedics;  Laterality: Right;  ? WRIST SURGERY Left   ? ?Social History  ? ?Social History Narrative  ? Patient lives at home with her husband [name]).  ? Retired.  ? Education- college.  ? Caffeine- Two cups of decaf tea daily.  ? Right handed.  ?   ?   ? ?Immunization History  ?Administered Date(s) Administered  ? Influenza, High Dose Seasonal PF 12/03/2013  ?  Influenza,inj,quad, With Preservative 11/23/2014  ? Influenza-Unspecified 12/19/2017  ? PFIZER(Purple Top)SARS-COV-2 Vaccination 04/16/2019, 05/29/2019, 01/09/2020  ? Td 12/15/2017  ? Zoster Recombinat (Shingrix) 08/10/2017, 01/05/2018  ?  ? ?Objective: ?Vital Signs: BP (!) 149/82 (BP Location: Left Arm, Patient Position: Sitting, Cuff Size: Normal)   Pulse 65   Ht  (1.6 m)   Wt 118 lb 3.2 oz (53.6 kg)   BMI 20.94 kg/m?   ? ?Physical Exam ?Vitals and nursing note reviewed.  ?Constitutional:   ?   Appearance: She is well-developed.  ?HENT:  ?   Head: Normocephalic and atraumatic.  ?Eyes:  ?   Conjunctiva/sclera: Conjunctivae normal.  ?Cardiovascular:  ?   Rate and Rhythm: Normal rate and regular rhythm.  ?   Heart sounds: Normal heart sounds.  ?Pulmonary:  ?   Effort: Pulmonary effort is normal.  ?   Breath sounds: Normal breath sounds.  ?Abdominal:   ?   General: Bowel sounds are normal.  ?   Palpations: Abdomen is soft.  ?Musculoskeletal:  ?   Cervical back: Normal range of motion.  ?Skin: ?   General: Skin is warm and dry.  ?   Capillary Refill: C

## 2021-07-02 ENCOUNTER — Other Ambulatory Visit (HOSPITAL_COMMUNITY): Payer: Self-pay

## 2021-07-02 ENCOUNTER — Other Ambulatory Visit: Payer: Self-pay | Admitting: Physician Assistant

## 2021-07-02 MED ORDER — UNIFINE PENTIPS 31G X 5 MM MISC
3 refills | Status: DC
  Filled 2021-07-02: qty 100, 90d supply, fill #0
  Filled 2021-10-28: qty 100, 100d supply, fill #1
  Filled 2022-02-10: qty 100, 100d supply, fill #2

## 2021-07-02 NOTE — Telephone Encounter (Signed)
Next Visit: 07/10/2021 ? ?Last Visit: 05/27/2021 ? ?Last Fill: 04/15/2021 ? ?DX: Osteopenia of multiple sites  ? ?Current Dose per office note 05/27/2021: Forteo was a started in February 2022.  ? ?Okay to refill Pen Needles?  ?

## 2021-07-08 ENCOUNTER — Other Ambulatory Visit (HOSPITAL_COMMUNITY): Payer: Self-pay

## 2021-07-08 ENCOUNTER — Telehealth: Payer: Self-pay

## 2021-07-08 DIAGNOSIS — Z79899 Other long term (current) drug therapy: Secondary | ICD-10-CM

## 2021-07-08 DIAGNOSIS — M0579 Rheumatoid arthritis with rheumatoid factor of multiple sites without organ or systems involvement: Secondary | ICD-10-CM

## 2021-07-08 NOTE — Telephone Encounter (Signed)
Pt reached out regarding pt assistance for Olumiant. Previous claim was $11.26, now copay is $100. ? ?PAP application filled out, pt will come to [facility] clinic tomorrow to sign and drop off Income docs. Provider portion sent with [employer] to be signed by Hazel Sams.  ? ?Will provide updates as they come. ?

## 2021-07-09 ENCOUNTER — Other Ambulatory Visit (HOSPITAL_COMMUNITY): Payer: Self-pay

## 2021-07-09 NOTE — Telephone Encounter (Signed)
Provider portion for Olumiant PAP through [name] placed on Sherron Ales, PA-C's desk today to be signed ? ?Chesley Mires, PharmD, MPH, BCPS, CPP ?Clinical Pharmacist (Rheumatology and Pulmonology) ?

## 2021-07-10 ENCOUNTER — Ambulatory Visit
Admission: RE | Admit: 2021-07-10 | Discharge: 2021-07-10 | Disposition: A | Discharge status: Home / Self Care | Payer: Medicare PPO | Source: Ambulatory Visit | Attending: Family Medicine | Admitting: Family Medicine

## 2021-07-10 ENCOUNTER — Ambulatory Visit: Payer: Medicare PPO | Admitting: Physician Assistant

## 2021-07-10 ENCOUNTER — Telehealth: Payer: Self-pay | Admitting: Pharmacist

## 2021-07-10 ENCOUNTER — Encounter: Payer: Self-pay | Admitting: Physician Assistant

## 2021-07-10 VITALS — BP 149/82 | HR 65 | Ht 63.0 in | Wt 118.2 lb

## 2021-07-10 DIAGNOSIS — E785 Hyperlipidemia, unspecified: Secondary | ICD-10-CM

## 2021-07-10 DIAGNOSIS — Z8669 Personal history of other diseases of the nervous system and sense organs: Secondary | ICD-10-CM

## 2021-07-10 DIAGNOSIS — Z8719 Personal history of other diseases of the digestive system: Secondary | ICD-10-CM

## 2021-07-10 DIAGNOSIS — M19041 Primary osteoarthritis, right hand: Secondary | ICD-10-CM | POA: Diagnosis not present

## 2021-07-10 DIAGNOSIS — G35 Multiple sclerosis: Secondary | ICD-10-CM

## 2021-07-10 DIAGNOSIS — Z96612 Presence of left artificial shoulder joint: Secondary | ICD-10-CM

## 2021-07-10 DIAGNOSIS — M810 Age-related osteoporosis without current pathological fracture: Secondary | ICD-10-CM

## 2021-07-10 DIAGNOSIS — M19071 Primary osteoarthritis, right ankle and foot: Secondary | ICD-10-CM | POA: Diagnosis not present

## 2021-07-10 DIAGNOSIS — M19072 Primary osteoarthritis, left ankle and foot: Secondary | ICD-10-CM

## 2021-07-10 DIAGNOSIS — M0579 Rheumatoid arthritis with rheumatoid factor of multiple sites without organ or systems involvement: Secondary | ICD-10-CM

## 2021-07-10 DIAGNOSIS — M4004 Postural kyphosis, thoracic region: Secondary | ICD-10-CM

## 2021-07-10 DIAGNOSIS — M8589 Other specified disorders of bone density and structure, multiple sites: Secondary | ICD-10-CM | POA: Diagnosis not present

## 2021-07-10 DIAGNOSIS — M19042 Primary osteoarthritis, left hand: Secondary | ICD-10-CM

## 2021-07-10 DIAGNOSIS — Z96651 Presence of right artificial knee joint: Secondary | ICD-10-CM | POA: Diagnosis not present

## 2021-07-10 DIAGNOSIS — Z79899 Other long term (current) drug therapy: Secondary | ICD-10-CM | POA: Diagnosis not present

## 2021-07-10 DIAGNOSIS — S129XXS Fracture of neck, unspecified, sequela: Secondary | ICD-10-CM | POA: Diagnosis not present

## 2021-07-10 DIAGNOSIS — M1712 Unilateral primary osteoarthritis, left knee: Secondary | ICD-10-CM

## 2021-07-10 DIAGNOSIS — Z8639 Personal history of other endocrine, nutritional and metabolic disease: Secondary | ICD-10-CM

## 2021-07-10 DIAGNOSIS — Z780 Asymptomatic menopausal state: Secondary | ICD-10-CM | POA: Diagnosis not present

## 2021-07-10 NOTE — Telephone Encounter (Signed)
Submitted Patient Assistance Application to [employer] for [place] along with provider portion, patient portion, med list, insurance card copy, PA and income documents. Will update patient when we receive a response. ? ?Patient will likely not qualify based on income but there is appeal process in place if denied. ?  ?Fax# [phone] ?Phone# [phone] ?  ?Knox Saliva, PharmD, MPH, BCPS, CPP ?Clinical Pharmacist (Rheumatology and Pulmonology) ?

## 2021-07-10 NOTE — Telephone Encounter (Signed)
Submitted Patient Assistance Application to [employer] for [place] along with provider portion, patient portion, med list, insurance card copy, PA and income documents. Will update patient when we receive a response. ? ?Fax# [phone] ?Phone# [phone] ? ?Knox Saliva, PharmD, MPH, BCPS, CPP ?Clinical Pharmacist (Rheumatology and Pulmonology) ?

## 2021-07-11 LAB — COMPLETE METABOLIC PANEL WITH GFR
AG Ratio: 2.4 (calc) (ref 1.0–2.5)
ALT: 15 U/L (ref 6–29)
AST: 22 U/L (ref 10–35)
Albumin: 4.3 g/dL (ref 3.6–5.1)
Alkaline phosphatase (APISO): 73 U/L (ref 37–153)
BUN: 23 mg/dL (ref 7–25)
CO2: 26 mmol/L (ref 20–32)
Calcium: 9.9 mg/dL (ref 8.6–10.4)
Chloride: 108 mmol/L (ref 98–110)
Creat: 0.77 mg/dL (ref 0.60–1.00)
Globulin: 1.8 g/dL (calc) — ABNORMAL LOW (ref 1.9–3.7)
Glucose, Bld: 85 mg/dL (ref 65–99)
Potassium: 4.6 mmol/L (ref 3.5–5.3)
Sodium: 141 mmol/L (ref 135–146)
Total Bilirubin: 0.7 mg/dL (ref 0.2–1.2)
Total Protein: 6.1 g/dL (ref 6.1–8.1)
eGFR: 79 mL/min/{1.73_m2} (ref >=60)

## 2021-07-11 LAB — CBC WITH DIFFERENTIAL/PLATELET
Absolute Monocytes: 732 cells/uL (ref 200–950)
Basophils Absolute: 31 cells/uL (ref 0–200)
Basophils Relative: 0.5 %
Eosinophils Absolute: 143 cells/uL (ref 15–500)
Eosinophils Relative: 2.3 %
HCT: 34.8 % — ABNORMAL LOW (ref 35.0–45.0)
Hemoglobin: 11.1 g/dL — ABNORMAL LOW (ref 11.7–15.5)
Lymphs Abs: 2158 cells/uL (ref 850–3900)
MCH: 29.8 pg (ref 27.0–33.0)
MCHC: 31.9 g/dL — ABNORMAL LOW (ref 32.0–36.0)
MCV: 93.5 fL (ref 80.0–100.0)
MPV: 10 fL (ref 7.5–12.5)
Monocytes Relative: 11.8 %
Neutro Abs: 3137 cells/uL (ref 1500–7800)
Neutrophils Relative %: 50.6 %
Platelets: 211 10*3/uL (ref 140–400)
RBC: 3.72 10*6/uL — ABNORMAL LOW (ref 3.80–5.10)
RDW: 14.7 % (ref 11.0–15.0)
Total Lymphocyte: 34.8 %
WBC: 6.2 10*3/uL (ref 3.8–10.8)

## 2021-07-11 MED ORDER — BARICITINIB 2 MG PO TABS: 2.0000 mg | ORAL_TABLET | Freq: Every day | ORAL | 0 refills | Status: DC

## 2021-07-11 NOTE — Telephone Encounter (Signed)
Received a fax from  [facility] regarding an approval for [place] patient assistance from 07/11/21 to 03/02/22.  ? ?IF:[id number] ? ?Phone number: [phone]  ? ?Patient can call [name] to set up delivery. ? ?Knox Saliva, PharmD, MPH, BCPS, CPP ?Clinical Pharmacist (Rheumatology and Pulmonology) ?

## 2021-07-11 NOTE — Telephone Encounter (Signed)
Spoke with patient's husband to review PAP enrollment for Olumiant. Provided him with phone number. He will plan to call when she has two weeks of medication left. Notified [facility] that patient will no longer fill with them. ? ?Knox Saliva, PharmD, MPH, BCPS, CPP ?Clinical Pharmacist (Rheumatology and Pulmonology) ?

## 2021-07-11 NOTE — Telephone Encounter (Signed)
Received fax from [facility] for [place] patient assistance, patient's application has been DENIED due to exceeding financial eligiblity criteria.  If patient believes she has enough medical bills between her and her spouse to qualify, she may submit appeal letter (financial hardship letter) and medical bills. ?  ?Phone: [phone] ? ?Knox Saliva, PharmD, MPH, BCPS, CPP ?Clinical Pharmacist (Rheumatology and Pulmonology) ?

## 2021-07-11 NOTE — Progress Notes (Signed)
Anemia is stable. Globulin is borderline low. Rest of CMP WNL.  No medication changes recommended at this time.

## 2021-07-11 NOTE — Telephone Encounter (Signed)
Spoke with patient's husband regarding possibility to appeal. They would not like to appeal at this time and will continue to fill at [facility] ? ?Chesley Mires, PharmD, MPH, BCPS, CPP ?Clinical Pharmacist (Rheumatology and Pulmonology) ?

## 2021-07-26 ENCOUNTER — Other Ambulatory Visit (HOSPITAL_COMMUNITY): Payer: Self-pay

## 2021-07-30 ENCOUNTER — Other Ambulatory Visit (HOSPITAL_COMMUNITY): Payer: Self-pay

## 2021-07-31 ENCOUNTER — Telehealth: Payer: Self-pay | Admitting: *Deleted

## 2021-07-31 NOTE — Telephone Encounter (Signed)
Received DEXA results from [facility].  Date of Scan: 07/10/2021  Lowest T-score:-2.2  BMD:0.728  Lowest site measured:Right Total Femur  DX: Osteopenia  Significant changes in BMD and site measured (5% and above):n/a  Current Regimen:Vitamin D and Forteo started: 04/11/2020  Recommendation:Discuss results in detail at follow up visit.   Reviewed by:Sherron Alesaylor Dale, PA-C  Next Appointment:  09/10/2021

## 2021-08-01 ENCOUNTER — Other Ambulatory Visit (HOSPITAL_COMMUNITY): Payer: Self-pay

## 2021-08-12 DIAGNOSIS — K219 Gastro-esophageal reflux disease without esophagitis: Secondary | ICD-10-CM | POA: Diagnosis not present

## 2021-08-12 DIAGNOSIS — E7800 Pure hypercholesterolemia, unspecified: Secondary | ICD-10-CM | POA: Diagnosis not present

## 2021-08-12 DIAGNOSIS — I10 Essential (primary) hypertension: Secondary | ICD-10-CM | POA: Diagnosis not present

## 2021-08-12 DIAGNOSIS — G4700 Insomnia, unspecified: Secondary | ICD-10-CM | POA: Diagnosis not present

## 2021-08-13 ENCOUNTER — Other Ambulatory Visit (HOSPITAL_COMMUNITY): Payer: Self-pay

## 2021-08-23 ENCOUNTER — Other Ambulatory Visit (HOSPITAL_COMMUNITY): Payer: Self-pay

## 2021-08-27 ENCOUNTER — Other Ambulatory Visit (HOSPITAL_COMMUNITY): Payer: Self-pay

## 2021-08-30 ENCOUNTER — Other Ambulatory Visit (HOSPITAL_COMMUNITY): Payer: Self-pay

## 2021-09-02 ENCOUNTER — Other Ambulatory Visit (HOSPITAL_COMMUNITY): Payer: Self-pay

## 2021-09-04 ENCOUNTER — Telehealth: Payer: Self-pay | Admitting: Hematology

## 2021-09-04 DIAGNOSIS — G5603 Carpal tunnel syndrome, bilateral upper limbs: Secondary | ICD-10-CM | POA: Diagnosis not present

## 2021-09-04 DIAGNOSIS — G629 Polyneuropathy, unspecified: Secondary | ICD-10-CM | POA: Diagnosis not present

## 2021-09-04 NOTE — Progress Notes (Signed)
Office Visit Note  Patient: Michele Bryan             Date of Birth: 1941/02/22           MRN: [id number]             PCP: Sigmund Hazel, MD Referring: Sigmund Hazel, MD Visit Date: 09/10/2021 Occupation: @  Subjective:  Joint stiffness, discuss DEXA results.  History of Present Illness: Michele Bryan is a 81 y.o. female with history of rheumatoid arthritis, osteoarthritis and osteoporosis.  She states she has been doing better on Olumiant.  She ran out of Plaquenil few months ago.  She plans to restart on Plaquenil.  She has been taking Forteo on a regular basis for osteoporosis.  She states that she has limited range of motion of the cervical spine.  She has some discomfort in her neck and lower back at night.  Her left shoulder joint replacement is not causing discomfort but she has limited range of motion.  None of the other joints are painful.  She has some stiffness in her hands.  She notices intermittent swelling in her hands.  Activities of Daily Living:  Patient reports morning stiffness for 1 hour.   Patient Reports nocturnal pain.  Difficulty dressing/grooming: Denies Difficulty climbing stairs: Denies Difficulty getting out of chair: Denies Difficulty using hands for taps, buttons, cutlery, and/or writing: Reports  Review of Systems  Constitutional:  Positive for fatigue.  HENT:  Negative for mouth sores, mouth dryness and nose dryness.   Eyes:  Negative for pain, itching and dryness.  Respiratory:  Negative for shortness of breath and difficulty breathing.   Cardiovascular:  Negative for chest pain and palpitations.  Gastrointestinal:  Positive for constipation and diarrhea. Negative for blood in stool.  Endocrine: Negative for increased urination.  Genitourinary:  Negative for difficulty urinating.  Musculoskeletal:  Positive for morning stiffness. Negative for joint pain, joint pain, joint swelling, myalgias, muscle tenderness and myalgias.  Skin:  Negative  for color change, rash, redness and sensitivity to sunlight.  Allergic/Immunologic: Negative for susceptible to infections.  Neurological:  Positive for memory loss. Negative for dizziness, numbness, headaches and weakness.  Hematological:  Positive for bruising/bleeding tendency.  Psychiatric/Behavioral:  Negative for confusion.     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   Gait abnormality 03/08/2020   Postoperative pain    Benign essential HTN    Constipation    Anemia of chronic disease    Essential hypertension    Orthostatic hypotension    Cervical myelopathy (HCC) 02/21/2020   Closed cervical spine fracture (HCC) 12/06/2019   Scapula fracture 03/05/2018   Chronic left shoulder pain 11/25/2017   Left cervical radiculopathy 09/29/2017   S/P shoulder replacement, left 05/15/2017   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  05/06/2016   High risk medication use 05/06/2016   Primary osteoarthritis of both hands 05/06/2016   Primary osteoarthritis of left knee 05/06/2016   Primary osteoarthritis of both feet 05/06/2016   Dyslipidemia 05/06/2016   Peripheral neuropathy 05/06/2016   Diverticulosis of intestine without bleeding 05/06/2016   Osteopenia  05/06/2016   Vitamin D deficiency 05/06/2016   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   History of GI bleed 05/06/2016   Cataract of both eyes 05/06/2016   Urinary urgency 04/30/2015   Chronic constipation 04/30/2015  MS (multiple sclerosis) (HCC) 03/23/2013   Expected blood loss anemia 06/16/2012   S/P right TKA 06/15/2012    Past Medical History:  Diagnosis Date   Anemia    Arthritis    GERD (gastroesophageal reflux disease)    History of blood transfusion    Hyperlipidemia    Hypertension    Lower extremity edema    MS (multiple sclerosis) (HCC)     Neuromuscular disorder (HCC)    multiple scleroosis/peripheral neuropathy   PONV (postoperative nausea and vomiting)    Rheumatoid arthritis (HCC)    Ulcer    Vitamin D deficiency     Family History  Problem Relation Age of Onset   Heart attack Father    Breast cancer Neg Hx    Past Surgical History:  Procedure Laterality Date   APPLICATION OF INTRAOPERATIVE CT SCAN N/A 02/16/2020   Procedure: APPLICATION OF INTRAOPERATIVE CT SCAN;  Surgeon: Jadene Pierini, MD;  Location: [facility] OR;  Service: Neurosurgery;  Laterality: N/A;   AUGMENTATION MAMMAPLASTY     BACK SURGERY     breast augumentation     BREAST SURGERY     biopsy   COLONOSCOPY     DILATION AND CURETTAGE OF UTERUS     EYE SURGERY     both eys,cataracts   goiter     HALO APPLICATION N/A 02/16/2020   Procedure: HALO TRACTION APPLICATION;  Surgeon: Jadene Pierini, MD;  Location: [facility] OR;  Service: Neurosurgery;  Laterality: N/A;   JOINT REPLACEMENT     KNEE ARTHROSCOPY Right    ORIF SHOULDER FRACTURE Left 03/05/2018   Procedure: OPEN REDUCTION INTERNAL FIXATION (ORIF) LEFT SCAPULA;  Surgeon: Beverely Low, MD;  Location: [facility] OR;  Service: Orthopedics;  Laterality: Left;   POSTERIOR CERVICAL FUSION/FORAMINOTOMY N/A 12/06/2019   Procedure: Cervical one-two Posterior instrumented fusion;  Surgeon: Jadene Pierini, MD;  Location: [facility] OR;  Service: Neurosurgery;  Laterality: N/A;   POSTERIOR CERVICAL FUSION/FORAMINOTOMY N/A 02/13/2020   Procedure: Revision of cervical instrumented fusion with Occiput to Cervical Four posterior instrumented fusion;  Surgeon: Jadene Pierini, MD;  Location: [facility] OR;  Service: Neurosurgery;  Laterality: N/A;  posterior   POSTERIOR CERVICAL FUSION/FORAMINOTOMY N/A 02/16/2020   Procedure: Occiput to Thoracic Two Posterior Cervical Fusion with AIRO and Application of Halo;  Surgeon: Jadene Pierini, MD;  Location: [facility] OR;  Service: Neurosurgery;  Laterality: N/A;   REVERSE SHOULDER  ARTHROPLASTY Left 05/15/2017   Procedure: LEFT REVERSE SHOULDER ARTHROPLASTY;  Surgeon: Beverely Low, MD;  Location[facility] OR;  Service: Orthopedics;  Laterality: Left;   REVERSE SHOULDER ARTHROPLASTY Left 10/11/2018   Procedure: left shoulder irrigation and debridement, open poly exchange and removal of painful hardware;  Surgeon: Beverely Low, MD;  Location: [facility] ORS;  Service: Orthopedics;  Laterality: Left;   SHOULDER HEMI-ARTHROPLASTY Left 12/11/2020   Procedure: Left reverse shoulder conversion to SHOULDER HEMI-ARTHROPLASTY;  Surgeon: Beverely Low, MD;  Location: [facility] ORS;  Service: Orthopedics;  Laterality: Left;  with ISB   THYROID LOBECTOMY     TOTAL KNEE ARTHROPLASTY Right 06/15/2012   Procedure: RIGHT TOTAL KNEE ARTHROPLASTY;  Surgeon: [name], MD;  Location: [facility] ORS;  Service: Orthopedics;  Laterality: Right;   WRIST SURGERY Left    Social History   Social History Narrative   Patient lives at home with her husband [name]).   Retired.   Education- college.   Caffeine- Two cups of decaf tea daily.   Right handed.  Immunization History  Administered Date(s) Administered   Influenza, High Dose Seasonal PF 12/03/2013   Influenza,inj,quad, With Preservative 11/23/2014   Influenza-Unspecified 12/19/2017   PFIZER(Purple Top)SARS-COV-2 Vaccination 04/16/2019, 05/29/2019, 01/09/2020   Td 12/15/2017   Zoster Recombinat (Shingrix) 08/10/2017, 01/05/2018     Objective: Vital Signs: BP (!) 156/77 (BP Location: Left Arm, Patient Position: Sitting, Cuff Size: Normal)   Pulse 69   Ht 5\' 3"  (1.6 m)   Wt 116 lb 9.6 oz (52.9 kg)   BMI 20.65 kg/m    Physical Exam Vitals and nursing note reviewed.  Constitutional:      Appearance: She is well-developed.  HENT:     Head: Normocephalic and atraumatic.  Eyes:     Conjunctiva/sclera: Conjunctivae normal.  Cardiovascular:     Rate and Rhythm: Normal rate and regular rhythm.     Heart sounds: Normal heart sounds.  Pulmonary:      Effort: Pulmonary effort is normal.     Breath sounds: Normal breath sounds.  Abdominal:     General: Bowel sounds are normal.     Palpations: Abdomen is soft.  Musculoskeletal:     Cervical back: Normal range of motion.  Lymphadenopathy:     Cervical: No cervical adenopathy.  Skin:    General: Skin is warm and dry.     Capillary Refill: Capillary refill takes less than 2 seconds.  Neurological:     Mental Status: She is alert and oriented to person, place, and time.  Psychiatric:        Behavior: Behavior normal.      Musculoskeletal Exam: She had limited lateral rotation, flexion and extension of the cervical spine.  Surgical scar was noted on the cervical and thoracic spine.  Thoracic kyphosis was noted.  She has very limited range of motion of the lumbar spine.  Left shoulder abduction was limited to 30 degrees.  Right shoulder joint was in full range of motion.  Elbow joints and wrist joints with good range of motion.  She has bilateral MCP PIP and DIP thickening with synovitis and some of the PIP joints as described below.  Hip joints and knee joints with good range of motion.  There is no tenderness over ankles or MTPs.  CDAI Exam: CDAI Score: 10.6  Patient Global: 2 mm; Provider Global: 4 mm Swollen: 5 ; Tender: 5  Joint Exam 09/10/2021      Right  Left  MCP 2  Swollen Tender     PIP 2     Swollen Tender  PIP 3  Swollen Tender  Swollen Tender  PIP 4  Swollen Tender        Investigation: No additional findings.  Imaging: No results found.  Recent Labs: Lab Results  Component Value Date   WBC 6.2 07/10/2021   HGB 11.1 (L) 07/10/2021   PLT 211 07/10/2021   NA 141 07/10/2021   K 4.6 07/10/2021   CL 108 07/10/2021   CO2 26 07/10/2021   GLUCOSE 85 07/10/2021   BUN 23 07/10/2021   CREATININE 0.77 07/10/2021   BILITOT 0.7 07/10/2021   ALKPHOS 100 03/06/2021   AST 22 07/10/2021   ALT 15 07/10/2021   PROT 6.1 07/10/2021   ALBUMIN 4.3 03/06/2021   CALCIUM  9.9 07/10/2021   GFRAA 98 04/03/2020   QFTBGOLDPLUS NEGATIVE 02/18/2021    Speciality Comments: PLQ Eye Exam:12/06/2020 WNL@ [facility] Osteoporosis managed by PCP-ACY 10/12/2018 Rinvoq-diarrhea, Xeljanz-12/19/20   Procedures:  No procedures performed Allergies: Meperidine hcl, Codeine,  and Demerol [meperidine]   Assessment / Plan:     Visit Diagnoses: Rheumatoid arthritis involving multiple sites with positive rheumatoid factor (HCC)-she continues to have some joint stiffness and discomfort.  She states the pain is mostly coming from her neck and lower back.  She has some stiffness in her hands.  She ran out of hydroxychloroquine.  She has been taking Olumiant 2 mg p.o. daily on a regular basis since March 2023.  She has had best response to Olumiant.  She has some discomfort in her shoulders which is manageable.  High risk medication use - Olumiant 2 mg daily and plaquenil 200 mg 1 tablet by mouth daily.  Patient ran out of hydroxychloroquine.  She will resume hydroxychloroquine.  Eye examination on December 06, 2020 was normal.  Labs obtained on Jul 10, 2021 showed CBC normal with low hemoglobin which is a stable.  CMP was normal.  TB gold was negative on February 18, 2021.  She had a lipid panel in December 2022.  Information regarding immunization was placed in the AVS.  She was also advised to stop Olumiant if she develops an infection.  FTA blackbox warning of major adverse cardiac cardiovascular events including blood clots, MI and stroke were discussed.  Patient voiced understanding.  H/O total shoulder replacement, left -she has limited range of motion of her left shoulder joint.  Right shoulder joint was in full range of motion.  Revision x2 performed by Dr. Devonne Doughty.   Primary osteoarthritis of both hands-she has osteoarthritis and rheumatoid arthritis overlap.  She has synovitis in some of the PIPs as described above.  She will resume hydroxychloroquine.  Status post right knee  replacement-she had good range of motion without any discomfort.  Primary osteoarthritis of left knee-she had good range of motion without any warmth swelling or effusion.  Primary osteoarthritis of both feet-she has severe osteoarthritis in her feet.  Proper fitting shoes were discussed.  Closed fracture of cervical vertebra, unspecified cervical vertebral level, sequela - Fall in January 2020-she fractured C1 and C2 with displacement requiring fusion on 12/06/2019.  She has limited mobility in her cervical spine.  Osteopenia of multiple sites - Jul 10, 2021 DEXA scan done at [facility] imaging showed BMD 0.728, T score -2.2 in the right femoral neck February 22, 2018 T score -2.1 and BMD 0.742  Forteo was a started in February 2022. Previously on fosamax.  DEXA findings were discussed with the patient and her husband today.  I would finish the course of Forteo for 2 years.  We may consider Reclast after finishing Forteo.  She has been tolerating Forteo well.  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  Stretching exercises were discussed.  History of vitamin D deficiency-vitamin D was low normal.  She was advised to take vitamin D on a regular basis.  Other medical problems listed as follows:  History of diverticulosis  History of GI bleed  History of peripheral neuropathy  Dyslipidemia  History of cataract  History of multiple sclerosis (HCC)  Orders: No orders of the defined types were placed in this encounter.  No orders of the defined types were placed in this encounter.    Follow-Up Instructions: Return for Rheumatoid arthritis, Osteoarthritis, Osteoporosis.   Pollyann Savoy, MD  Note - This record has been created using Animal nutritionist.  Chart creation errors have been sought, but may not always  have been located. Such creation errors do not reflect on  the standard of medical  care.

## 2021-09-04 NOTE — Telephone Encounter (Signed)
Rescheduled upcoming appointment due to provider's PAL. Patient's husband is aware of changes. °

## 2021-09-07 ENCOUNTER — Other Ambulatory Visit: Payer: Self-pay | Admitting: Physician Assistant

## 2021-09-07 DIAGNOSIS — M0579 Rheumatoid arthritis with rheumatoid factor of multiple sites without organ or systems involvement: Secondary | ICD-10-CM

## 2021-09-09 NOTE — Telephone Encounter (Signed)
Next Visit: 09/10/2021  Last Visit: 07/10/2021  Labs: 07/10/2021 Anemia is stable. Globulin is borderline low. Rest of CMP WNL.  No medication changes recommended at this time.   Eye exam: 12/06/2020 WNL   Current Dose per office note 07/10/2021: plaquenil 200 mg 1 tablet by mouth daily.   DX: Rheumatoid arthritis involving multiple sites with positive rheumatoid factor   Last Fill: 04/15/2021  Okay to refill Plaquenil?

## 2021-09-10 ENCOUNTER — Encounter: Payer: Self-pay | Admitting: Rheumatology

## 2021-09-10 ENCOUNTER — Ambulatory Visit: Payer: Medicare PPO | Admitting: Rheumatology

## 2021-09-10 VITALS — BP 156/77 | HR 69 | Ht 63.0 in | Wt 116.6 lb

## 2021-09-10 DIAGNOSIS — M8589 Other specified disorders of bone density and structure, multiple sites: Secondary | ICD-10-CM | POA: Diagnosis not present

## 2021-09-10 DIAGNOSIS — Z8719 Personal history of other diseases of the digestive system: Secondary | ICD-10-CM

## 2021-09-10 DIAGNOSIS — M0579 Rheumatoid arthritis with rheumatoid factor of multiple sites without organ or systems involvement: Secondary | ICD-10-CM

## 2021-09-10 DIAGNOSIS — M19041 Primary osteoarthritis, right hand: Secondary | ICD-10-CM | POA: Diagnosis not present

## 2021-09-10 DIAGNOSIS — M19071 Primary osteoarthritis, right ankle and foot: Secondary | ICD-10-CM

## 2021-09-10 DIAGNOSIS — S129XXS Fracture of neck, unspecified, sequela: Secondary | ICD-10-CM

## 2021-09-10 DIAGNOSIS — Z79899 Other long term (current) drug therapy: Secondary | ICD-10-CM

## 2021-09-10 DIAGNOSIS — E785 Hyperlipidemia, unspecified: Secondary | ICD-10-CM

## 2021-09-10 DIAGNOSIS — M1712 Unilateral primary osteoarthritis, left knee: Secondary | ICD-10-CM

## 2021-09-10 DIAGNOSIS — Z8669 Personal history of other diseases of the nervous system and sense organs: Secondary | ICD-10-CM

## 2021-09-10 DIAGNOSIS — M4004 Postural kyphosis, thoracic region: Secondary | ICD-10-CM

## 2021-09-10 DIAGNOSIS — Z96651 Presence of right artificial knee joint: Secondary | ICD-10-CM | POA: Diagnosis not present

## 2021-09-10 DIAGNOSIS — M19042 Primary osteoarthritis, left hand: Secondary | ICD-10-CM

## 2021-09-10 DIAGNOSIS — M19072 Primary osteoarthritis, left ankle and foot: Secondary | ICD-10-CM

## 2021-09-10 DIAGNOSIS — Z96612 Presence of left artificial shoulder joint: Secondary | ICD-10-CM | POA: Diagnosis not present

## 2021-09-10 DIAGNOSIS — G35 Multiple sclerosis: Secondary | ICD-10-CM

## 2021-09-10 DIAGNOSIS — Z8639 Personal history of other endocrine, nutritional and metabolic disease: Secondary | ICD-10-CM

## 2021-09-10 NOTE — Patient Instructions (Signed)
Standing Labs We placed an order today for your standing lab work.   Please have your standing labs drawn in August and every 3 months  If possible, please have your labs drawn 2 weeks prior to your appointment so that the provider can discuss your results at your appointment.  Please note that you may see your imaging and lab results in MyChart before we have reviewed them. We may be awaiting multiple results to interpret others before contacting you. Please allow our office up to 72 hours to thoroughly review all of the results before contacting the office for clarification of your results.  We have open lab daily: Monday through Thursday from 1:30-4:30 PM and Friday from 1:30-4:00 PM at the office of Dr. Pollyann Savoy, [facility].   Please be advised, all patients with office appointments requiring lab work will take precedent over walk-in lab work.  If possible, please come for your lab work on Monday and Friday afternoons, as you may experience shorter wait times. The office is located at [address], [place], [place] [address] No appointment is necessary.   Labs are drawn by [facility]. Please bring your co-pay at the time of your lab draw.  You may receive a bill from [facility] for your lab work.  Please note if you are on Hydroxychloroquine and and an order has been placed for a Hydroxychloroquine level, you will need to have it drawn 4 hours or more after your last dose.  If you wish to have your labs drawn at another location, please call the office 24 hours in advance to send orders.  If you have any questions regarding directions or hours of operation,  please call [phone].   As a reminder, please drink plenty of water prior to coming for your lab work. Thanks!   Vaccines You are taking a medication(s) that can suppress your immune system.  The following immunizations are recommended: Flu annually Covid-19  Td/Tdap (tetanus, diphtheria,  pertussis) every 10 years Pneumonia (Prevnar 15 then Pneumovax 23 at least 1 year apart.  Alternatively, can take Prevnar 20 without needing additional dose) Shingrix: 2 doses from 4 weeks to 6 months apart  Please check with your PCP to make sure you are up to date.  If you have signs or symptoms of an infection or start antibiotics: First, call your PCP for workup of your infection. Hold your medication through the infection, until you complete your antibiotics, and until symptoms resolve if you take the following: Injectable medication (Actemra, Benlysta, Cimzia, Cosentyx, Enbrel, Humira, Kevzara, Orencia, Remicade, Simponi, Stelara, Taltz, Tremfya) Methotrexate Leflunomide (Arava) Mycophenolate (Cellcept) Harriette Ohara, Olumiant, or Rinvoq   COVID-19 vaccine recommendations:   COVID-19 vaccine is recommended for everyone (unless you are allergic to a vaccine component), even if you are on a medication that suppresses your immune system.   If you are on Methotrexate, Cellcept (mycophenolate), Rinvoq, Harriette Ohara, and Olumiant- hold the medication for 1 week after each vaccine. Hold Methotrexate for 2 weeks after the single dose COVID-19 vaccine.   .   Do not take Tylenol or any anti-inflammatory medications (NSAIDs) 24 hours prior to the COVID-19 vaccination.   There is no direct evidence about the efficacy of the COVID-19 vaccine in individuals who are on medications that suppress the immune system.   Even if you are fully vaccinated, and you are on any medications that suppress your immune system, please continue to wear a mask, maintain at least six feet social distance and  practice hand hygiene.   If you develop a COVID-19 infection, please contact your PCP or our office to determine if you need monoclonal antibody infusion.  The booster vaccine is now available for immunocompromised patients.   Please see the following web sites for updated information.    [email or web address]

## 2021-09-11 ENCOUNTER — Inpatient Hospital Stay: Payer: Medicare PPO

## 2021-09-11 ENCOUNTER — Inpatient Hospital Stay: Payer: Medicare PPO | Admitting: Hematology

## 2021-09-27 ENCOUNTER — Other Ambulatory Visit: Payer: Self-pay | Admitting: Physician Assistant

## 2021-09-27 DIAGNOSIS — M0579 Rheumatoid arthritis with rheumatoid factor of multiple sites without organ or systems involvement: Secondary | ICD-10-CM

## 2021-09-27 DIAGNOSIS — Z79899 Other long term (current) drug therapy: Secondary | ICD-10-CM

## 2021-09-27 NOTE — Telephone Encounter (Signed)
Next Visit: 12/10/2021  Last Visit: 09/10/2021  Last Fill: 07/11/2021  DX: Rheumatoid arthritis involving multiple sites with positive rheumatoid factor   Current Dose per office note 09/10/2021: Olumiant 2 mg daily  Labs: 07/10/2021 Anemia is stable. Globulin is borderline low. Rest of CMP WNL.  No medication changes recommended at this time.   TB Gold: 02/18/2021 Neg    Okay to refill Olumiant?

## 2021-10-08 ENCOUNTER — Other Ambulatory Visit: Payer: Self-pay | Admitting: Physician Assistant

## 2021-10-08 DIAGNOSIS — M0579 Rheumatoid arthritis with rheumatoid factor of multiple sites without organ or systems involvement: Secondary | ICD-10-CM

## 2021-10-18 ENCOUNTER — Other Ambulatory Visit (HOSPITAL_COMMUNITY): Payer: Self-pay

## 2021-10-22 ENCOUNTER — Other Ambulatory Visit: Payer: Self-pay | Admitting: *Deleted

## 2021-10-22 DIAGNOSIS — D508 Other iron deficiency anemias: Secondary | ICD-10-CM

## 2021-10-23 ENCOUNTER — Other Ambulatory Visit: Payer: Self-pay

## 2021-10-23 ENCOUNTER — Inpatient Hospital Stay: Payer: Medicare PPO | Attending: Hematology

## 2021-10-23 ENCOUNTER — Inpatient Hospital Stay: Payer: Medicare PPO | Admitting: Hematology

## 2021-10-23 VITALS — BP 155/70 | HR 78 | Temp 97.6°F | Resp 15 | Ht 63.0 in | Wt 119.6 lb

## 2021-10-23 DIAGNOSIS — M069 Rheumatoid arthritis, unspecified: Secondary | ICD-10-CM | POA: Diagnosis not present

## 2021-10-23 DIAGNOSIS — D649 Anemia, unspecified: Secondary | ICD-10-CM

## 2021-10-23 DIAGNOSIS — D508 Other iron deficiency anemias: Secondary | ICD-10-CM

## 2021-10-23 DIAGNOSIS — I10 Essential (primary) hypertension: Secondary | ICD-10-CM | POA: Insufficient documentation

## 2021-10-23 DIAGNOSIS — E785 Hyperlipidemia, unspecified: Secondary | ICD-10-CM | POA: Diagnosis not present

## 2021-10-23 DIAGNOSIS — Z79899 Other long term (current) drug therapy: Secondary | ICD-10-CM | POA: Insufficient documentation

## 2021-10-23 DIAGNOSIS — M810 Age-related osteoporosis without current pathological fracture: Secondary | ICD-10-CM | POA: Insufficient documentation

## 2021-10-23 DIAGNOSIS — G35 Multiple sclerosis: Secondary | ICD-10-CM | POA: Insufficient documentation

## 2021-10-23 LAB — CBC WITH DIFFERENTIAL (CANCER CENTER ONLY)
Abs Immature Granulocytes: 0.01 10*3/uL (ref 0.00–0.07)
Basophils Absolute: 0 10*3/uL (ref 0.0–0.1)
Basophils Relative: 1 %
Eosinophils Absolute: 0.1 10*3/uL (ref 0.0–0.5)
Eosinophils Relative: 2 %
HCT: 32.2 % — ABNORMAL LOW (ref 36.0–46.0)
Hemoglobin: 10.7 g/dL — ABNORMAL LOW (ref 12.0–15.0)
Immature Granulocytes: 0 %
Lymphocytes Relative: 40 %
Lymphs Abs: 2.2 10*3/uL (ref 0.7–4.0)
MCH: 32 pg (ref 26.0–34.0)
MCHC: 33.2 g/dL (ref 30.0–36.0)
MCV: 96.4 fL (ref 80.0–100.0)
Monocytes Absolute: 0.5 10*3/uL (ref 0.1–1.0)
Monocytes Relative: 9 %
Neutro Abs: 2.6 10*3/uL (ref 1.7–7.7)
Neutrophils Relative %: 48 %
Platelet Count: 220 10*3/uL (ref 150–400)
RBC: 3.34 MIL/uL — ABNORMAL LOW (ref 3.87–5.11)
RDW: 13.5 % (ref 11.5–15.5)
WBC Count: 5.5 10*3/uL (ref 4.0–10.5)
nRBC: 0 % (ref 0.0–0.2)

## 2021-10-23 LAB — CMP (CANCER CENTER ONLY)
ALT: 18 U/L (ref 0–44)
AST: 25 U/L (ref 15–41)
Albumin: 4.6 g/dL (ref 3.5–5.0)
Alkaline Phosphatase: 62 U/L (ref 38–126)
Anion gap: 4 — ABNORMAL LOW (ref 5–15)
BUN: 25 mg/dL — ABNORMAL HIGH (ref 8–23)
CO2: 30 mmol/L (ref 22–32)
Calcium: 9.7 mg/dL (ref 8.9–10.3)
Chloride: 105 mmol/L (ref 98–111)
Creatinine: 0.74 mg/dL (ref 0.44–1.00)
GFR, Estimated: >60 mL/min (ref >60)
Glucose, Bld: 85 mg/dL (ref 70–99)
Potassium: 4.2 mmol/L (ref 3.5–5.1)
Sodium: 139 mmol/L (ref 135–145)
Total Bilirubin: 1 mg/dL (ref 0.3–1.2)
Total Protein: 6.6 g/dL (ref 6.5–8.1)

## 2021-10-23 LAB — RETICULOCYTES
Immature Retic Fract: 11 % (ref 2.3–15.9)
RBC.: 3.27 MIL/uL — ABNORMAL LOW (ref 3.87–5.11)
Retic Count, Absolute: 34 10*3/uL (ref 19.0–186.0)
Retic Ct Pct: 1 % (ref 0.4–3.1)

## 2021-10-23 LAB — IRON AND IRON BINDING CAPACITY (CC-WL,HP ONLY)
Iron: 102 ug/dL (ref 28–170)
Saturation Ratios: 30 % (ref 10.4–31.8)
TIBC: 337 ug/dL (ref 250–450)
UIBC: 235 ug/dL (ref 148–442)

## 2021-10-23 LAB — FERRITIN: Ferritin: 212 ng/mL (ref 11–307)

## 2021-10-23 LAB — VITAMIN B12: Vitamin B-12: 2180 pg/mL — ABNORMAL HIGH (ref 180–914)

## 2021-10-28 ENCOUNTER — Other Ambulatory Visit (HOSPITAL_COMMUNITY): Payer: Self-pay

## 2021-10-29 ENCOUNTER — Encounter: Payer: Self-pay | Admitting: Hematology

## 2021-10-29 NOTE — Progress Notes (Signed)
Michele Bryan   HEMATOLOGY/ONCOLOGY CLINIC VISIT  NOTE  Date of Service: .10/23/2021   Patient Care Team: Kathyrn Lass, MD as PCP - General (Family Medicine)  CHIEF COMPLAINTS/PURPOSE OF CONSULTATION:  Follow-up for continued evaluation and management of anemia  HISTORY OF PRESENTING ILLNESS:   Michele Bryan is a wonderful 81 y.o. female who has been referred to [place] by Dr .Kathyrn Lass, MD / Dr Bo Merino MD for evaluation and management of Anemia.  Patient has a history of hypertension, dyslipidemia, peripheral neuropathy, rheumatoid arthritis on Plaquenil, osteoporosis history of multiple surgeries including multiple orthopedic surgeries as noted below. Most recently she had a left shoulder surgery with Dr. Veverly Fells on 12/11/2020.  Review of her available labs show her hemoglobin was 10.5 on 12/06/2020 prior to her left shoulder surgery with an MCV of 89 and normal WBC count and platelets. CBC done with her rheumatologist Dr. Estanislado Pandy on 02/18/2021 showed a hemoglobin of 9.6 with an MCV of 86 WBC count of 5.5k platelet count of 231k. I do not see any additional work-up having been done for the anemia.  Patient was referred to [place] for further evaluation and management. Patient notes some fatigue and some generalized weakness but no presyncopal or syncopal symptoms. No acute chest pain or shortness of breath. Multiple joint pains and back pain related to her rheumatoid arthritis and surgeries.  She is accompanied for this clinic visit by her husband. She endorses no acute weight loss or other constitutional symptoms such as fevers chills night sweats.  INTERVAL HISTORY  Michele Bryan is here for continued evaluation and management of her normocytic anemia. She notes no acute new issues since her last clinic visit. No overt issues with abnormal bleeding or bruising. She notes her rheumatoid arthritis is fairly well controlled at this time. Labs done today were discussed in detail with  the patient.  MEDICAL HISTORY:  Past Medical History:  Diagnosis Date   Anemia    Arthritis    GERD (gastroesophageal reflux disease)    History of blood transfusion    Hyperlipidemia    Hypertension    Lower extremity edema    MS (multiple sclerosis) (HCC)    Neuromuscular disorder (HCC)    multiple scleroosis/peripheral neuropathy   PONV (postoperative nausea and vomiting)    Rheumatoid arthritis ([place])    Ulcer    Vitamin D deficiency     SURGICAL HISTORY: Past Surgical History:  Procedure Laterality Date   APPLICATION OF INTRAOPERATIVE CT SCAN N/A 02/16/2020   Procedure: APPLICATION OF INTRAOPERATIVE CT SCAN;  Surgeon: Judith Part, MD;  Location: [place];  Service: Neurosurgery;  Laterality: N/A;   AUGMENTATION MAMMAPLASTY     BACK SURGERY     breast augumentation     BREAST SURGERY     biopsy   COLONOSCOPY     DILATION AND CURETTAGE OF UTERUS     EYE SURGERY     both eys,cataracts   goiter     HALO APPLICATION N/A [id number]   Procedure: HALO TRACTION APPLICATION;  Surgeon: Judith Part, MD;  Location: [place];  Service: Neurosurgery;  Laterality: N/A;   JOINT REPLACEMENT     KNEE ARTHROSCOPY Right    ORIF SHOULDER FRACTURE Left 03/05/2018   Procedure: OPEN REDUCTION INTERNAL FIXATION (ORIF) LEFT SCAPULA;  Surgeon: Netta Cedars, MD;  Location: [place];  Service: Orthopedics;  Laterality: Left;   POSTERIOR CERVICAL FUSION/FORAMINOTOMY N/A 12/06/2019   Procedure: Cervical one-two Posterior instrumented fusion;  Surgeon: Zada Finders,  Joyice Faster, MD;  Location: [place];  Service: Neurosurgery;  Laterality: N/A;   POSTERIOR CERVICAL FUSION/FORAMINOTOMY N/A 02/13/2020   Procedure: Revision of cervical instrumented fusion with Occiput to Cervical Four posterior instrumented fusion;  Surgeon: Judith Part, MD;  Location: [place];  Service: Neurosurgery;  Laterality: N/A;  posterior   POSTERIOR CERVICAL FUSION/FORAMINOTOMY N/A 02/16/2020   Procedure: Occiput to  Thoracic Two Posterior Cervical Fusion with AIRO and Application of Halo;  Surgeon: Judith Part, MD;  Location: [place];  Service: Neurosurgery;  Laterality: N/A;   REVERSE SHOULDER ARTHROPLASTY Left 05/15/2017   Procedure: LEFT REVERSE SHOULDER ARTHROPLASTY;  Surgeon: Netta Cedars, MD;  Location: [place];  Service: Orthopedics;  Laterality: Left;   REVERSE SHOULDER ARTHROPLASTY Left 10/11/2018   Procedure: left shoulder irrigation and debridement, open poly exchange and removal of painful hardware;  Surgeon: Netta Cedars, MD;  Location: [facility];  Service: Orthopedics;  Laterality: Left;   SHOULDER HEMI-ARTHROPLASTY Left 12/11/2020   Procedure: Left reverse shoulder conversion to SHOULDER HEMI-ARTHROPLASTY;  Surgeon: Netta Cedars, MD;  Location: [facility];  Service: Orthopedics;  Laterality: Left;  with ISB   THYROID LOBECTOMY     TOTAL KNEE ARTHROPLASTY Right 06/15/2012   Procedure: RIGHT TOTAL KNEE ARTHROPLASTY;  Surgeon: Mauri Pole, MD;  Location: [facility];  Service: Orthopedics;  Laterality: Right;   WRIST SURGERY Left     SOCIAL HISTORY: Social History   Socioeconomic History   Marital status: Married    Spouse name: Michele Bryan   Number of children: 3   Years of education: college   Highest education level: Not on file  Occupational History    Comment: retired  Tobacco Use   Smoking status: Never    Passive exposure: Never   Smokeless tobacco: Never  Vaping Use   Vaping Use: Never used  Substance and Sexual Activity   Alcohol use: Yes    Comment: wine occ   Drug use: Never   Sexual activity: Not on file  Other Topics Concern   Not on file  Social History Narrative   Patient lives at home with her husband Michele Bryan).   Retired.   Education- college.   Caffeine- Two cups of decaf tea daily.   Right handed.         Social Determinants of Health   Financial Resource Strain: Not on file  Food Insecurity: Not on file  Transportation Needs: Not on file  Physical Activity: Not  on file  Stress: Not on file  Social Connections: Not on file  Intimate Partner Violence: Not on file    FAMILY HISTORY: Family History  Problem Relation Age of Onset   Heart attack Father    Breast cancer Neg Hx     ALLERGIES:  is allergic to meperidine hcl, codeine, and demerol [meperidine].  MEDICATIONS:  Current Outpatient Medications  Medication Sig Dispense Refill   acetaminophen (TYLENOL) 650 MG CR tablet Take 1,300 mg by mouth daily. Take additional  1,300 mg of needed in the evening     amLODipine (NORVASC) 5 MG tablet Take 1 tablet (5 mg total) by mouth daily. 30 tablet 0   Cyanocobalamin (VITAMIN B-12) 5000 MCG TBDP Take 5,000 mcg by mouth daily. 30 tablet 0   furosemide (LASIX) 20 MG tablet Take 1 tablet (20 mg total) by mouth daily as needed (as needed for swelling). 15 tablet 3   gabapentin (NEURONTIN) 100 MG capsule Take 1 capsule (100 mg total) by mouth 3 (three) times daily.  90 capsule 11   hydroxychloroquine (PLAQUENIL) 200 MG tablet TAKE ONE TABLET BY MOUTH ONE TIME DAILY (Patient not taking: Reported on 09/10/2021) 90 tablet 0   hyoscyamine (LEVBID) 0.375 MG 12 hr tablet Take 0.375 mg by mouth every 12 (twelve) hours as needed for cramping.     Insulin Pen Needle (UNIFINE PENTIPS) 31G X 5 MM MISC USE TO INJECT FORTEO ONCE DAILY. DO NOT RE-USE. 100 each 3   losartan (COZAAR) 50 MG tablet Take 50 mg by mouth 2 (two) times daily.     nystatin cream (MYCOSTATIN) Apply 1 application topically daily as needed.     OLUMIANT tablet TAKE 1 TABLET BY MOUTH ONCE DAILY 90 tablet 0   omeprazole (PRILOSEC) 40 MG capsule Take 1 capsule (40 mg total) by mouth daily. 30 capsule 0   ondansetron (ZOFRAN) 4 MG tablet Take 4 mg by mouth every 8 (eight) hours as needed for nausea or vomiting.     polyethylene glycol (MIRALAX / GLYCOLAX) 17 g packet Take 17 g by mouth as needed.     potassium chloride SA (KLOR-CON) 20 MEQ tablet Take 1 tablet (20 mEq total) by mouth as needed (when you  take Furosemide). 15 tablet 3   rosuvastatin (CRESTOR) 5 MG tablet Take 5 mg by mouth daily.     Teriparatide, Recombinant, (FORTEO) 600 MCG/2.4ML SOPN Inject 20 mcg into the skin daily. Discard after 28 days 2.4 mL 5   traMADol (ULTRAM) 50 MG tablet Take 1 tablet (50 mg total) by mouth every 6 (six) hours as needed for moderate pain. 30 tablet 0   zolpidem (AMBIEN) 10 MG tablet Take 10 mg by mouth at bedtime as needed for sleep.     No current facility-administered medications for this visit.    REVIEW OF SYSTEMS:   10 Point review of Systems was done is negative except as noted above.  PHYSICAL EXAMINATION: .BP (!) 155/70 (BP Location: Right Arm, Patient Position: Sitting) Comment: nurse notified  Pulse 78   Temp 97.6 F (36.4 C) (Temporal)   Resp 15   Ht 5\' 3"  (1.6 m)   Wt 119 lb 9.6 oz (54.3 kg)   SpO2 98%   BMI 21.19 kg/m  NAD GENERAL:alert, in no acute distress and comfortable SKIN: no acute rashes, no significant lesions EYES: conjunctiva are pink and non-injected, sclera anicteric OROPHARYNX: MMM, no exudates, no oropharyngeal erythema or ulceration NECK: supple, no JVD LYMPH:  no palpable lymphadenopathy in the cervical, axillary or inguinal regions LUNGS: clear to auscultation b/l with normal respiratory effort HEART: regular rate & rhythm ABDOMEN:  normoactive bowel sounds , non tender, not distended. Extremity: no pedal edema PSYCH: alert & oriented x 3 with fluent speech NEURO: no focal motor/sensory deficits  LABORATORY DATA:  I have reviewed the data as listed  .    Latest Ref Rng & Units 10/23/2021    9:06 AM 07/10/2021    8:21 AM 05/27/2021   11:07 AM  CBC  WBC 4.0 - 10.5 K/uL 5.5  6.2  5.8   Hemoglobin 12.0 - 15.0 g/dL 10.7  11.1  11.1   Hematocrit 36.0 - 46.0 % 32.2  34.8  35.3   Platelets 150 - 400 K/uL 220  211  181     .    Latest Ref Rng & Units 10/23/2021    9:06 AM 07/10/2021    8:21 AM 05/27/2021   11:07 AM  CMP  Glucose 70 - 99 mg/dL 85   85  87   BUN 8 - 23 mg/dL 25  23  17    Creatinine 0.44 - 1.00 mg/dL  9.44  9.67   Sodium 135 - 145 mmol/L 139  141  140   Potassium 3.5 - 5.1 mmol/L 4.2  4.6  4.2   Chloride 98 - 111 mmol/L 105  108  108   CO2 22 - 32 mmol/L 30  26  25    Calcium 8.9 - 10.3 mg/dL 9.7  9.9  9.2   Total Protein 6.5 - 8.1 g/dL 6.6  6.1  6.4   Total Bilirubin 0.3 - 1.2 mg/dL 1.0  0.7  0.8   Alkaline Phos 38 - 126 U/L 62     AST 15 - 41 U/L 25  22  19    ALT 0 - 44 U/L 18  15  11     Component     Latest Ref Rng & Units 03/06/2021 03/06/2021        12:19 PM 12:20 PM  WBC     4.0 - 10.5 K/uL 5.9   RBC     3.87 - 5.11 MIL/uL 3.83 (L)   Hemoglobin     12.0 - 15.0 g/dL (L)   HCT     - 46.6 % 33.4 (L) 31.5 (L)  MCV     80.0 - 100.0 fL 87.2   MCH     26.0 - 34.0 pg 26.9   MCHC     30.0 - 36.0 g/dL 05/04/2021   RDW     05/04/2021 - 63.8 % 15.5   Platelets     150 - 400 K/uL 218   nRBC     0.0 - 0.2 % 0.0   Neutrophils     % 53   NEUT#     1.7 - 7.7 K/uL 3.1   Lymphocytes     % 33   Lymphocyte #     0.7 - 4.0 K/uL 2.0   Monocytes Relative     % 11   Monocyte #     0.1 - 1.0 K/uL 0.6   Eosinophil     % 2   Eosinophils Absolute     0.0 - 0.5 K/uL 0.1   Basophil     % 1   Basophils Absolute     0.0 - 0.1 K/uL 0.1   Immature Granulocytes     % 0   Abs Immature Granulocytes     0.00 - 0.07 K/uL 0.02   Sodium     135 - 145 mmol/L 138   Potassium     3.5 - 5.1 mmol/L 4.5   Chloride     98 - 111 mmol/L 105   CO2     22 - 32 mmol/L 25   Glucose     70 - 99 mg/dL 78   BUN     8 - 23 mg/dL 19   Creatinine     46.6 - 1.00 mg/dL 59.9   Calcium     8.9 - 10.3 mg/dL 9.3   Total Protein     6.5 - 8.1 g/dL 7.1   Albumin     3.5 - 5.0 g/dL 4.3   AST     15 - 41 U/L 16   ALT     0 - 44 U/L 11   Alkaline Phosphatase     38 - 126 U/L 100   Total Bilirubin     0.3 - 1.2 mg/dL 0.9  GFR, Est Non African American     >60 mL/min >60   Anion gap     5 - 15 8   IgG (Immunoglobin G),  Serum     586 - 1,602 mg/dL  993  IgA     64 - 570 mg/dL  82  IgM (Immunoglobulin M), Srm     26 - 217 mg/dL  74  Total Protein ELP     6.0 - 8.5 g/dL  6.3  Albumin SerPl Elph-Mcnc     2.9 - 4.4 g/dL  3.6  Alpha 1     0.0 - 0.4 g/dL  0.2  Alpha2 Glob SerPl Elph-Mcnc     0.4 - 1.0 g/dL  0.6  B-Globulin SerPl Elph-Mcnc     0.7 - 1.3 g/dL  0.9  Gamma Glob SerPl Elph-Mcnc     0.4 - 1.8 g/dL  0.9  M Protein SerPl Elph-Mcnc     Not Observed g/dL  Not Observed  Globulin, Total     2.2 - 3.9 g/dL  2.7  Albumin/Glob SerPl     0.7 - 1.7  1.4  IFE 1       Comment  Please Note (HCV):       Comment  Retic Ct Pct     0.4 - 3.1 %  1.3  RBC.     3.87 - 5.11 MIL/uL  3.81 (L)  Retic Count, Absolute     19.0 - 186.0 K/uL  48.4  Immature Retic Fract     2.3 - 15.9 %  14.0  Iron     28 - 170 ug/dL 42   TIBC     177 - 939 ug/dL 030 (H)   Saturation Ratios     10.4 - 31.8 % 9 (L)   UIBC     148 - 442 ug/dL 092 (H)   Folate, Hemolysate     Not Estab. ng/mL  389.0  Folate, RBC     >498 ng/mL  1,235  Ferritin     11 - 307 ng/mL 19   Vitamin B12     180 - 914 pg/mL 1,475 (H)   Copper     80 - 158 ug/dL 330   Sed Rate     0 - 22 mm/hr 6   TSH     0.308 - 3.960 uIU/mL  1.100   RADIOGRAPHIC STUDIES: I have personally reviewed the radiological images as listed and agreed with the findings in the report. No results found.  ASSESSMENT & PLAN:   81 year old female with history of hypertension, dyslipidemia, rheumatoid arthritis on Plaquenil with  1) Mild normocytic anemia Her baseline hemoglobin appears to be in the 10-11 range over the last year. Hemoglobin was 10.5 on 12/06/2020 prior to her left shoulder surgery and dropped to 9.6 likely due to blood loss with surgery. PLAN Seen today were discussed in detail with the patient. CBC shows stable hemoglobin of 10.7 with normal WBC count and platelets CMP stable Ferritin within normal limits at 212 with an iron saturation of  30% B12 within normal limits Patient has no indication for additional IV iron at this time Continue to optimize rheumatoid arthritis treatment with rheumatologist May continue iron polysaccharide 150 mg p.o. 3 times weekly for maintenance to maintain her ferritin more than 200.  Follow-up Follow-up with PCP Return to clinic with Dr. Candise Che as needed  The total time spent in the appointment was 15 minutes*.  All of the patient's questions were  answered with apparent satisfaction. The patient knows to call the clinic with any problems, questions or concerns.   Sullivan Lone MD MS AAHIVMS [facility] Hematology/Oncology Physician [facility]  .*Total Encounter Time as defined by the Centers for Medicare and Medicaid Services includes, in addition to the face-to-face time of a patient visit (documented in the note above) non-face-to-face time: obtaining and reviewing outside history, ordering and reviewing medications, tests or procedures, care coordination (communications with other health care professionals or caregivers) and documentation in the medical record.

## 2021-11-14 ENCOUNTER — Other Ambulatory Visit (HOSPITAL_COMMUNITY): Payer: Self-pay

## 2021-11-18 ENCOUNTER — Other Ambulatory Visit (HOSPITAL_COMMUNITY): Payer: Self-pay

## 2021-11-18 DIAGNOSIS — L821 Other seborrheic keratosis: Secondary | ICD-10-CM | POA: Diagnosis not present

## 2021-11-18 DIAGNOSIS — D1801 Hemangioma of skin and subcutaneous tissue: Secondary | ICD-10-CM | POA: Diagnosis not present

## 2021-11-18 DIAGNOSIS — D692 Other nonthrombocytopenic purpura: Secondary | ICD-10-CM | POA: Diagnosis not present

## 2021-11-18 DIAGNOSIS — L84 Corns and callosities: Secondary | ICD-10-CM | POA: Diagnosis not present

## 2021-11-18 DIAGNOSIS — L814 Other melanin hyperpigmentation: Secondary | ICD-10-CM | POA: Diagnosis not present

## 2021-11-21 DIAGNOSIS — Z681 Body mass index (BMI) 19 or less, adult: Secondary | ICD-10-CM | POA: Diagnosis not present

## 2021-11-21 DIAGNOSIS — S129XXA Fracture of neck, unspecified, initial encounter: Secondary | ICD-10-CM | POA: Diagnosis not present

## 2021-11-25 ENCOUNTER — Other Ambulatory Visit (HOSPITAL_COMMUNITY): Payer: Self-pay

## 2021-11-25 DIAGNOSIS — R413 Other amnesia: Secondary | ICD-10-CM | POA: Diagnosis not present

## 2021-11-25 DIAGNOSIS — I10 Essential (primary) hypertension: Secondary | ICD-10-CM | POA: Diagnosis not present

## 2021-11-25 DIAGNOSIS — E7800 Pure hypercholesterolemia, unspecified: Secondary | ICD-10-CM | POA: Diagnosis not present

## 2021-11-25 DIAGNOSIS — D510 Vitamin B12 deficiency anemia due to intrinsic factor deficiency: Secondary | ICD-10-CM | POA: Diagnosis not present

## 2021-11-26 ENCOUNTER — Other Ambulatory Visit: Payer: Self-pay | Admitting: Family Medicine

## 2021-11-26 DIAGNOSIS — R413 Other amnesia: Secondary | ICD-10-CM

## 2021-11-26 NOTE — Progress Notes (Unsigned)
Office Visit Note  Patient: Michele Bryan             Date of Birth: Jul 29, 1940           MRN: [id number]             PCP: Sigmund Hazel, MD Referring: Sigmund Hazel, MD Visit Date: 12/10/2021 Occupation: @GUAROCC @  Subjective:  Right great toe   History of Present Illness: Michele Bryan is a 81 y.o. female with history of seropositive rheumatoid arthritis and osteoarthritis.  Patient is currently taking Olumiant 2 mg daily and plaquenil 200 mg 1 tablet by mouth daily.  She has been tolerating combination therapy without any side effects and has not missed any doses recently.  She states she is been experiencing increased pain in the right great toe and states that the bunion has progressed.  She has an upcoming appointment with Dr. Al Corpus for further evaluation and to discuss treatment options.  She has been taking Tylenol as needed for pain relief and if the pain is severe she alternates taking Tylenol and ibuprofen.  She also remains on gabapentin as prescribed.  She continues to have chronic pain and stiffness in both hands due to underlying osteoarthritis.  She denies any increased joint swelling in her hands at this time.  She has ongoing pain and stiffness in her neck as well as both shoulder replacements.  She states overall her right knee replacement is doing well.  She denies any new medical conditions.  She denies any recent infections.  She denies any gaps in therapy.   Activities of Daily Living:  Patient reports morning stiffness for 3 hours.   Patient Reports nocturnal pain.  Difficulty dressing/grooming: Denies Difficulty climbing stairs: Denies Difficulty getting out of chair: Denies Difficulty using hands for taps, buttons, cutlery, and/or writing: Reports  Review of Systems  Constitutional:  Negative for fatigue.  HENT:  Negative for mouth sores and mouth dryness.   Eyes:  Negative for dryness.  Respiratory:  Negative for shortness of breath.   Cardiovascular:   Negative for chest pain and palpitations.  Gastrointestinal:  Positive for constipation and diarrhea. Negative for blood in stool.  Endocrine: Positive for increased urination.  Genitourinary:  Negative for involuntary urination.  Musculoskeletal:  Positive for joint pain, gait problem, joint pain, joint swelling, myalgias, morning stiffness, muscle tenderness and myalgias. Negative for muscle weakness.  Skin:  Negative for color change, rash, hair loss and sensitivity to sunlight.  Allergic/Immunologic: Negative for susceptible to infections.  Neurological:  Negative for dizziness and headaches.  Hematological:  Negative for swollen glands.  Psychiatric/Behavioral:  Positive for sleep disturbance. Negative for depressed mood. The patient is not nervous/anxious.     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   Gait abnormality 03/08/2020   Postoperative pain    Benign essential HTN    Constipation    Anemia of chronic disease    Essential hypertension    Orthostatic hypotension    Cervical myelopathy (HCC) 02/21/2020   Closed cervical spine fracture (HCC) 12/06/2019   Scapula fracture 03/05/2018   Chronic left shoulder pain 11/25/2017   Left cervical radiculopathy 09/29/2017   S/P shoulder replacement, left 05/15/2017   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  05/06/2016   High risk medication use 05/06/2016   Primary osteoarthritis of both hands 05/06/2016  Primary osteoarthritis of left knee 05/06/2016   Primary osteoarthritis of both feet 05/06/2016   Dyslipidemia 05/06/2016   Peripheral neuropathy 05/06/2016   Diverticulosis of intestine without bleeding 05/06/2016   Osteopenia  05/06/2016   Vitamin D deficiency 05/06/2016   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   History of GI bleed 05/06/2016    Cataract of both eyes 05/06/2016   Urinary urgency 04/30/2015   Chronic constipation 04/30/2015   MS (multiple sclerosis) (HCC) 03/23/2013   Expected blood loss anemia 06/16/2012   S/P right TKA 06/15/2012    Past Medical History:  Diagnosis Date   Anemia    Arthritis    GERD (gastroesophageal reflux disease)    History of blood transfusion    Hyperlipidemia    Hypertension    Lower extremity edema    MS (multiple sclerosis) (HCC)    Neuromuscular disorder (HCC)    multiple scleroosis/peripheral neuropathy   PONV (postoperative nausea and vomiting)    Rheumatoid arthritis (HCC)    Ulcer    Vitamin D deficiency     Family History  Problem Relation Age of Onset   Heart attack Father    Breast cancer Neg Hx    Past Surgical History:  Procedure Laterality Date   APPLICATION OF INTRAOPERATIVE CT SCAN N/A 02/16/2020   Procedure: APPLICATION OF INTRAOPERATIVE CT SCAN;  Surgeon: Jadene Pierini, MD;  Location: [facility] OR;  Service: Neurosurgery;  Laterality: N/A;   AUGMENTATION MAMMAPLASTY     BACK SURGERY     breast augumentation     BREAST SURGERY     biopsy   COLONOSCOPY     DILATION AND CURETTAGE OF UTERUS     EYE SURGERY     both eys,cataracts   goiter     HALO APPLICATION N/A 02/16/2020   Procedure: HALO TRACTION APPLICATION;  Surgeon: Jadene Pierini, MD;  Location: [facility] OR;  Service: Neurosurgery;  Laterality: N/A;   JOINT REPLACEMENT     KNEE ARTHROSCOPY Right    ORIF SHOULDER FRACTURE Left 03/05/2018   Procedure: OPEN REDUCTION INTERNAL FIXATION (ORIF) LEFT SCAPULA;  Surgeon: Beverely Low, MD;  Location: [facility] OR;  Service: Orthopedics;  Laterality: Left;   POSTERIOR CERVICAL FUSION/FORAMINOTOMY N/A 12/06/2019   Procedure: Cervical one-two Posterior instrumented fusion;  Surgeon: Jadene Pierini, MD;  Location: [facility] OR;  Service: Neurosurgery;  Laterality: N/A;   POSTERIOR CERVICAL FUSION/FORAMINOTOMY N/A 02/13/2020   Procedure: Revision of cervical instrumented  fusion with Occiput to Cervical Four posterior instrumented fusion;  Surgeon: Jadene Pierini, MD;  Location: [facility] OR;  Service: Neurosurgery;  Laterality: N/A;  posterior   POSTERIOR CERVICAL FUSION/FORAMINOTOMY N/A 02/16/2020   Procedure: Occiput to Thoracic Two Posterior Cervical Fusion with AIRO and Application of Halo;  Surgeon: Jadene Pierini, MD;  Location: [facility] OR;  Service: Neurosurgery;  Laterality: N/A;   REVERSE SHOULDER ARTHROPLASTY Left 05/15/2017   Procedure: LEFT REVERSE SHOULDER ARTHROPLASTY;  Surgeon: Beverely Low, MD;  Location: [facility] OR;  Service: Orthopedics;  Laterality: Left;   REVERSE SHOULDER ARTHROPLASTY Left 10/11/2018   Procedure: left shoulder irrigation and debridement, open poly exchange and removal of painful hardware;  Surgeon: Beverely Low, MD;  Location: [facility] ORS;  Service: Orthopedics;  Laterality: Left;   SHOULDER HEMI-ARTHROPLASTY Left 12/11/2020   Procedure: Left reverse shoulder conversion to SHOULDER HEMI-ARTHROPLASTY;  Surgeon: Beverely Low, MD;  Location: [facility] OR[facility];  Service: Orthopedics;  Laterality: Left;  with ISB   THYROID LOBECTOMY     TOTAL KNEE ARTHROPLASTY  Right 06/15/2012   Procedure: RIGHT TOTAL KNEE ARTHROPLASTY;  Surgeon: [name], MD;  Location: [facility];  Service: Orthopedics;  Laterality: Right;   WRIST SURGERY Left    Social History   Social History Narrative   Patient lives at home with her husband [name]).   Retired.   Education- college.   Caffeine- Two cups of decaf tea daily.   Right handed.         Immunization History  Administered Date(s) Administered   Influenza, High Dose Seasonal PF 12/03/2013   Influenza,inj,quad, With Preservative 11/23/2014   Influenza-Unspecified 12/19/2017   PFIZER(Purple Top)SARS-COV-2 Vaccination 04/16/2019, 05/29/2019, 01/09/2020   Td 12/15/2017   Zoster Recombinat (Shingrix) 08/10/2017, 01/05/2018     Objective: Vital Signs: BP 120/68 (BP Location: Left Arm, Patient Position: Sitting,  Cuff Size: Normal)   Pulse 62   Resp 16   Ht 5\' 3"  (1.6 m)   Wt 116 lb (52.6 kg)   BMI 20.55 kg/m    Physical Exam Vitals and nursing note reviewed.  Constitutional:      Appearance: She is well-developed.  HENT:     Head: Normocephalic and atraumatic.  Eyes:     Conjunctiva/sclera: Conjunctivae normal.  Cardiovascular:     Rate and Rhythm: Normal rate and regular rhythm.     Heart sounds: Normal heart sounds.  Pulmonary:     Effort: Pulmonary effort is normal.     Breath sounds: Normal breath sounds.  Abdominal:     General: Bowel sounds are normal.     Palpations: Abdomen is soft.  Musculoskeletal:     Cervical back: Normal range of motion.  Skin:    General: Skin is warm and dry.     Capillary Refill: Capillary refill takes less than 2 seconds.  Neurological:     Mental Status: She is alert and oriented to person, place, and time.  Psychiatric:        Behavior: Behavior normal.      Musculoskeletal Exam: Severely limited range of motion of the C-spine.  Surgical scar noted on the cervical and thoracic spine.  Thoracic kyphosis noted.  Limited range of motion of the lumbar spine.  Bilateral shoulder replacements have severely limited range of motion with discomfort in the left shoulder.  Elbow joints have good range of motion with no tenderness or synovitis.  No rheumatoid nodules noted.  Wrist joints have good range of motion with no tenderness or synovitis.  Severe CMC joint thickening noted bilaterally.  Thickening over the MCP, PIP, DIP joints.  Some synovitis over several PIP joints as described below.  Hip joints difficult to assess in seated position.  Right knee replacement has good range of motion with no warmth or effusion.  Left knee joint has good range of motion with no warmth or effusion.  Ankle joints have good range of motion with no tenderness or joint swelling.  Overcrowding of toes noted.  Severe bunion of the right great toe noted with overlap with the  second toe.  CDAI Exam: CDAI Score: 9  Patient Global: 5 mm; Provider Global: 5 mm Swollen: 4 ; Tender: 4  Joint Exam 12/10/2021      Right  Left  PIP 2     Swollen Tender  PIP 3  Swollen Tender  Swollen Tender  PIP 4  Swollen Tender        Investigation: No additional findings.  Imaging: No results found.  Recent Labs: Lab Results  Component Value Date  WBC 5.5 10/23/2021   HGB 10.7 (L) 10/23/2021   PLT 220 10/23/2021   NA 139 10/23/2021   K 4.2 10/23/2021   CL 105 10/23/2021   CO2 30 10/23/2021   GLUCOSE 85 10/23/2021   BUN 25 (H) 10/23/2021   CREATININE 0.74 10/23/2021   BILITOT 1.0 10/23/2021   ALKPHOS 62 10/23/2021   AST 25 10/23/2021   ALT 18 10/23/2021   PROT 6.6 10/23/2021   ALBUMIN 4.6 10/23/2021   CALCIUM 9.7 10/23/2021   GFRAA 98 04/03/2020   QFTBGOLDPLUS NEGATIVE 02/18/2021    Speciality Comments: PLQ Eye Exam:12/06/2020 WNL@ [facility] Osteoporosis managed by PCP-ACY 10/12/2018 Rinvoq-diarrhea, Xeljanz-12/19/20  Procedures:  No procedures performed Allergies: Meperidine hcl, Codeine, and Demerol [meperidine]   Assessment / Plan:     Visit Diagnoses: Rheumatoid arthritis involving multiple sites with positive rheumatoid factor (HCC): Patient continues to experience generalized arthralgias and joint stiffness.  Her pain is most severe in both hands due to underlying osteoarthritis.  She has some increased discomfort in the right great toe and has noticed progression in the bunion.  She has an upcoming appointment Dr. Al Corpus for further evaluation.  She is currently taking Olumiant 2 mg by mouth daily and Plaquenil 200 mg 1 tablet by mouth daily.  She has been tolerating combination therapy without any side effects.  Overall her symptoms have been manageable.  She has been taking Tylenol as needed for pain relief and if she develops severe pain she alternates Tylenol and ibuprofen for symptomatic relief.  She will remain on the current combination.   She was advised to notify us if she develops any new or worsening symptoms.  She will follow-up in the office in 3 months or sooner if needed.  Association of heart disease with rheumatoid arthritis was discussed. Need to monitor blood pressure, cholesterol, and to exercise 30-60 minutes on daily basis was discussed.  High risk medication use - Olumiant 2 mg by mouth daily and plaquenil 200 mg 1 tablet by mouth daily.  - Plan: QuantiFERON-TB Gold Plus, CBC with Differential/Platelet, COMPLETE METABOLIC PANEL WITH GFR CBC and CMP were drawn on 10/23/2021.  Her next lab work will be due in November and every 3 months to monitor for drug toxicity.  Standing orders for CBC and CMP were placed today. TB Gold negative on 02/18/2021.  Future order for TB Gold placed today. She has not had any recent or recurrent infections.  Discussed the importance of holding Olumiant if she develops signs or symptoms of an infection and to resume once the infection has completely cleared. Patient reports being up-to-date with the Shingrix vaccine as well as pneumonia vaccine.  She is planning on having a COVID booster.  She has had the annual flu shot. PLQ Eye Exam:12/06/2020 WNL@ [facility].  Patient was given a Plaquenil eye examination form to take with her to her appointment next week. Discussed the importance of yearly skin examinations for skin cancer screening.  She has been seeing her dermatologist on a yearly basis. Future order for lipid panel placed today. Discussed the importance of close blood pressure monitoring.  Blood pressure was 120/68 today in the office. Counseled on the increase risk of venous thrombosis. Counseled about FDA black box warning of MACE (major adverse CV events including cardiovascular death, myocardial infarction, and stroke).  Reviewed with patient that there is the possibility of an increased risk of malignancy specifically lung cancer and lymphomas but it is not well understood if  this increased  risk is due to the medication or the disease state. Instructed patient that medication should be held for infection and prior to surgery.  Advised patient to avoid live vaccines.   Screening for tuberculosis -Future order for TB Gold placed today.  Plan: QuantiFERON-TB Gold Plus  H/O total shoulder replacement, left - Revision x2 performed by Dr. Alma Friendly. Chronic pain and stiffness.   Primary osteoarthritis of both hands: She has severe PIP and DIP thickening consistent with osteoarthritis of both hands. Incomplete fist formation bilaterally.   Status post right knee replacement: Good range of motion with no discomfort.  No warmth or effusion noted.  Primary osteoarthritis of left knee: She has good range of motion of the left knee joint on examination today.  No warmth or effusion noted.  Primary osteoarthritis of both feet - She has severe osteoarthritis in her feet.  Right bunion has progressed and is causing overcrowding and overlap with adjacent toes.  She will be establishing care with Dr. Milinda Pointer.     Closed fracture of cervical vertebra, unspecified cervical vertebral level, sequela - Fall in January 2020-she fractured C1 and C2 with displacement requiring fusion on 12/06/2019.  Extremely limited range of motion of the C-spine.  Osteopenia of multiple sites - 07/10/2021 DEXA [facility] imaging showed BMD 0.728, T score -2.2 in the right femoral neck.  Patient remains on Forteo daily injections.  She initiated Forteo in February 2022.  Previously was taking Fosamax.  May consider transitioning to Reclast after completing Forteo.  Postural kyphosis of thoracic region: No midline spinal tenderness currently.   History of vitamin D deficiency: She is taking vitamin D 2000 units daily.  Other medical conditions are listed as follows:  History of GI bleed  History of diverticulosis  Dyslipidemia -Future order for lipid panel placed today.  Plan: Lipid panel  History of  peripheral neuropathy  History of multiple sclerosis ([place])  History of cataract    Orders: Orders Placed This Encounter  Procedures   QuantiFERON-TB Gold Plus   CBC with Differential/Platelet   COMPLETE METABOLIC PANEL WITH GFR   Lipid panel   No orders of the defined types were placed in this encounter.    Follow-Up Instructions: Return in about 3 months (around 03/12/2022) for Rheumatoid arthritis, Osteoarthritis.   Ofilia Neas, PA-C  Note - This record has been created using Dragon software.  Chart creation errors have been sought, but may not always  have been located. Such creation errors do not reflect on  the standard of medical care.

## 2021-12-09 ENCOUNTER — Ambulatory Visit
Admission: RE | Admit: 2021-12-09 | Discharge: 2021-12-09 | Disposition: A | Discharge status: Home / Self Care | Payer: Medicare PPO | Source: Ambulatory Visit | Attending: Family Medicine | Admitting: Family Medicine

## 2021-12-09 DIAGNOSIS — R413 Other amnesia: Secondary | ICD-10-CM | POA: Diagnosis not present

## 2021-12-09 DIAGNOSIS — R9082 White matter disease, unspecified: Secondary | ICD-10-CM | POA: Diagnosis not present

## 2021-12-09 MED ORDER — GADOBENATE DIMEGLUMINE 529 MG/ML IV SOLN
9.0000 mL | Freq: Once | INTRAVENOUS | Status: AC | PRN
  Administered 2021-12-09: 9 mL via INTRAVENOUS

## 2021-12-10 ENCOUNTER — Ambulatory Visit: Payer: Medicare PPO | Attending: Physician Assistant | Admitting: Physician Assistant

## 2021-12-10 ENCOUNTER — Encounter: Payer: Self-pay | Admitting: Physician Assistant

## 2021-12-10 VITALS — BP 120/68 | HR 62 | Resp 16 | Ht 63.0 in | Wt 116.0 lb

## 2021-12-10 DIAGNOSIS — M0579 Rheumatoid arthritis with rheumatoid factor of multiple sites without organ or systems involvement: Secondary | ICD-10-CM

## 2021-12-10 DIAGNOSIS — M19071 Primary osteoarthritis, right ankle and foot: Secondary | ICD-10-CM

## 2021-12-10 DIAGNOSIS — S129XXS Fracture of neck, unspecified, sequela: Secondary | ICD-10-CM

## 2021-12-10 DIAGNOSIS — Z96612 Presence of left artificial shoulder joint: Secondary | ICD-10-CM

## 2021-12-10 DIAGNOSIS — M8589 Other specified disorders of bone density and structure, multiple sites: Secondary | ICD-10-CM

## 2021-12-10 DIAGNOSIS — M1712 Unilateral primary osteoarthritis, left knee: Secondary | ICD-10-CM

## 2021-12-10 DIAGNOSIS — E785 Hyperlipidemia, unspecified: Secondary | ICD-10-CM

## 2021-12-10 DIAGNOSIS — G35 Multiple sclerosis: Secondary | ICD-10-CM

## 2021-12-10 DIAGNOSIS — Z8719 Personal history of other diseases of the digestive system: Secondary | ICD-10-CM

## 2021-12-10 DIAGNOSIS — M19041 Primary osteoarthritis, right hand: Secondary | ICD-10-CM | POA: Diagnosis not present

## 2021-12-10 DIAGNOSIS — Z96651 Presence of right artificial knee joint: Secondary | ICD-10-CM

## 2021-12-10 DIAGNOSIS — G35D Multiple sclerosis, unspecified: Secondary | ICD-10-CM

## 2021-12-10 DIAGNOSIS — Z8669 Personal history of other diseases of the nervous system and sense organs: Secondary | ICD-10-CM

## 2021-12-10 DIAGNOSIS — Z111 Encounter for screening for respiratory tuberculosis: Secondary | ICD-10-CM

## 2021-12-10 DIAGNOSIS — Z79899 Other long term (current) drug therapy: Secondary | ICD-10-CM | POA: Diagnosis not present

## 2021-12-10 DIAGNOSIS — M19072 Primary osteoarthritis, left ankle and foot: Secondary | ICD-10-CM

## 2021-12-10 DIAGNOSIS — M19042 Primary osteoarthritis, left hand: Secondary | ICD-10-CM

## 2021-12-10 DIAGNOSIS — Z8639 Personal history of other endocrine, nutritional and metabolic disease: Secondary | ICD-10-CM

## 2021-12-10 DIAGNOSIS — M4004 Postural kyphosis, thoracic region: Secondary | ICD-10-CM

## 2021-12-10 NOTE — Patient Instructions (Signed)
Standing Labs We placed an order today for your standing lab work.   Please have your standing labs drawn in November and every 3 months   Please have your labs drawn 2 weeks prior to your appointment so that the provider can discuss your lab results at your appointment.  Please note that you may see your imaging and lab results in MyChart before we have reviewed them. We will contact you once all results are reviewed. Please allow our office up to 72 hours to thoroughly review all of the results before contacting the office for clarification of your results.  Lab hours are: Monday through Thursday from 1:30 pm-4:30 pm and Friday from 1:30 pm- 4:00 pm  You may experience shorter wait times on Monday, Thursday or Friday afternoons,.   Effective December 30, 2021, new lab hours will be: Monday through Thursday from 8:00 am -12:30 pm and 1:00 pm-5:00 pm and Friday from 8:00 am-12:00 pm.  Please be advised, all patients with office appointments requiring lab work will take precedent over walk-in lab work.   Labs are drawn by [facility]. Please bring your co-pay at the time of your lab draw.  You may receive a bill from [facility] for your lab work.  Please note if you are on Hydroxychloroquine and and an order has been placed for a Hydroxychloroquine level, you will need to have it drawn 4 hours or more after your last dose.  If you wish to have your labs drawn at another location, please call the office 24 hours in advance so we can fax the orders.  The office is located at [address], [address], [place], [place] [address] No appointment is necessary.    If you have any questions regarding directions or hours of operation,  please call [phone].   As a reminder, please drink plenty of water prior to coming for your lab work. Thanks!  

## 2021-12-12 ENCOUNTER — Other Ambulatory Visit (HOSPITAL_COMMUNITY): Payer: Self-pay

## 2021-12-12 ENCOUNTER — Other Ambulatory Visit: Payer: Self-pay | Admitting: Podiatry

## 2021-12-12 ENCOUNTER — Encounter: Payer: Self-pay | Admitting: Podiatry

## 2021-12-12 ENCOUNTER — Ambulatory Visit (INDEPENDENT_AMBULATORY_CARE_PROVIDER_SITE_OTHER): Payer: Medicare PPO

## 2021-12-12 ENCOUNTER — Other Ambulatory Visit: Payer: Self-pay | Admitting: Rheumatology

## 2021-12-12 ENCOUNTER — Ambulatory Visit: Payer: Medicare PPO | Admitting: Podiatry

## 2021-12-12 DIAGNOSIS — L97511 Non-pressure chronic ulcer of other part of right foot limited to breakdown of skin: Secondary | ICD-10-CM

## 2021-12-12 DIAGNOSIS — R0989 Other specified symptoms and signs involving the circulatory and respiratory systems: Secondary | ICD-10-CM

## 2021-12-12 DIAGNOSIS — Z79899 Other long term (current) drug therapy: Secondary | ICD-10-CM

## 2021-12-12 DIAGNOSIS — M778 Other enthesopathies, not elsewhere classified: Secondary | ICD-10-CM

## 2021-12-12 DIAGNOSIS — M0579 Rheumatoid arthritis with rheumatoid factor of multiple sites without organ or systems involvement: Secondary | ICD-10-CM

## 2021-12-12 MED ORDER — MUPIROCIN 2 % EX OINT: 1.0000 | TOPICAL_OINTMENT | Freq: Two times a day (BID) | CUTANEOUS | 0 refills | Status: DC

## 2021-12-12 MED ORDER — AMOXICILLIN-POT CLAVULANATE 875-125 MG PO TABS: 1.0000 | ORAL_TABLET | Freq: Two times a day (BID) | ORAL | 0 refills | Status: DC

## 2021-12-12 NOTE — Progress Notes (Signed)
Subjective:  Patient ID: Michele Bryan, female    DOB: [birth year],  MRN: [id number] HPI Chief Complaint  Patient presents with   Toe Pain    2nd toe right - small ulcer dorsally x few weeks, hallux overlaps and rubs and gets a lot of pressure, tried using multiple creams-antifungals, antibiotic ointments, tried spacers, starting to get more red and sore   New Patient (Initial Visit)    81 y.o. female presents with the above complaint.   ROS: Denies fever chills nausea vomit muscle aches pains calf pain back pain chest pain shortness of breath.  History of MS.  Past Medical History:  Diagnosis Date   Anemia    Arthritis    GERD (gastroesophageal reflux disease)    History of blood transfusion    Hyperlipidemia    Hypertension    Lower extremity edema    MS (multiple sclerosis) (HCC)    Neuromuscular disorder (HCC)    multiple scleroosis/peripheral neuropathy   PONV (postoperative nausea and vomiting)    Rheumatoid arthritis (HCC)    Ulcer    Vitamin D deficiency    Past Surgical History:  Procedure Laterality Date   APPLICATION OF INTRAOPERATIVE CT SCAN N/A 02/16/2020   Procedure: APPLICATION OF INTRAOPERATIVE CT SCAN;  Surgeon: Jadene Pierini, MD;  Location: [facility] OR;  Service: Neurosurgery;  Laterality: N/A;   AUGMENTATION MAMMAPLASTY     BACK SURGERY     breast augumentation     BREAST SURGERY     biopsy   COLONOSCOPY     DILATION AND CURETTAGE OF UTERUS     EYE SURGERY     both eys,cataracts   goiter     HALO APPLICATION N/A 02/16/2020   Procedure: HALO TRACTION APPLICATION;  Surgeon: Jadene Pierini, MD;  Location: [facility] OR;  Service: Neurosurgery;  Laterality: N/A;   JOINT REPLACEMENT     KNEE ARTHROSCOPY Right    ORIF SHOULDER FRACTURE Left 03/05/2018   Procedure: OPEN REDUCTION INTERNAL FIXATION (ORIF) LEFT SCAPULA;  Surgeon: Beverely Low, MD;  Location: [facility];  Service: Orthopedics;  Laterality: Left;   POSTERIOR CERVICAL FUSION/FORAMINOTOMY N/A  12/06/2019   Procedure: Cervical one-two Posterior instrumented fusion;  Surgeon: Jadene Pierini, MD;  Location: [facility] OR;  Service: Neurosurgery;  Laterality: N/A;   POSTERIOR CERVICAL FUSION/FORAMINOTOMY N/A 02/13/2020   Procedure: Revision of cervical instrumented fusion with Occiput to Cervical Four posterior instrumented fusion;  Surgeon: Jadene Pierini, MD;  Location: [facility] OR;  Service: Neurosurgery;  Laterality: N/A;  posterior   POSTERIOR CERVICAL FUSION/FORAMINOTOMY N/A 02/16/2020   Procedure: Occiput to Thoracic Two Posterior Cervical Fusion with AIRO and Application of Halo;  Surgeon: Jadene Pierini, MD;  Location: [facility] OR;  Service: Neurosurgery;  Laterality: N/A;   REVERSE SHOULDER ARTHROPLASTY Left 05/15/2017   Procedure: LEFT REVERSE SHOULDER ARTHROPLASTY;  Surgeon: Beverely Low, MD;  Location: [facility] OR;  Service: Orthopedics;  Laterality: Left;   REVERSE SHOULDER ARTHROPLASTY Left 10/11/2018   Procedure: left shoulder irrigation and debridement, open poly exchange and removal of painful hardware;  Surgeon: Beverely Low, MD;  Location: [facility] ORS;  Service: Orthopedics;  Laterality: Left;   SHOULDER HEMI-ARTHROPLASTY Left 12/11/2020   Procedure: Left reverse shoulder conversion to SHOULDER HEMI-ARTHROPLASTY;  Surgeon: Beverely Low, MD;  Location: [facility] ORS;  Service: Orthopedics;  Laterality: Left;  with ISB   THYROID LOBECTOMY     TOTAL KNEE ARTHROPLASTY Right 06/15/2012   Procedure: RIGHT TOTAL KNEE ARTHROPLASTY;  Surgeon: [name], MD;  Location: [facility] ORS;  Service: Orthopedics;  Laterality: Right;   WRIST SURGERY Left     Current Outpatient Medications:    amoxicillin-clavulanate (AUGMENTIN) 875-125 MG tablet, Take 1 tablet by mouth 2 (two) times daily., Disp: 20 tablet, Rfl: 0   mupirocin ointment (BACTROBAN) 2 %, Apply 1 Application topically 2 (two) times daily., Disp: 22 g, Rfl: 0   acetaminophen (TYLENOL) 650 MG CR tablet, Take 1,300 mg by mouth daily. Take additional   1,300 mg of needed in the evening, Disp: , Rfl:    amLODipine (NORVASC) 5 MG tablet, Take 1 tablet (5 mg total) by mouth daily., Disp: 30 tablet, Rfl: 0   aspirin EC 81 MG tablet, Take 81 mg by mouth daily. Swallow whole., Disp: , Rfl:    Cholecalciferol (VITAMIN D3) 50 MCG (2000 UT) TABS, Take by mouth., Disp: , Rfl:    Cyanocobalamin (VITAMIN B-12) 5000 MCG TBDP, Take 5,000 mcg by mouth daily., Disp: 30 tablet, Rfl: 0   furosemide (LASIX) 20 MG tablet, Take 1 tablet (20 mg total) by mouth daily as needed (as needed for swelling)., Disp: 15 tablet, Rfl: 3   gabapentin (NEURONTIN) 100 MG capsule, Take 1 capsule (100 mg total) by mouth 3 (three) times daily., Disp: 90 capsule, Rfl: 11   hydroxychloroquine (PLAQUENIL) 200 MG tablet, TAKE ONE TABLET BY MOUTH ONE TIME DAILY, Disp: 90 tablet, Rfl: 0   hyoscyamine (LEVBID) 0.375 MG 12 hr tablet, Take 0.375 mg by mouth every 12 (twelve) hours as needed for cramping., Disp: , Rfl:    Insulin Pen Needle (UNIFINE PENTIPS) 31G X 5 MM MISC, USE TO INJECT FORTEO ONCE DAILY. DO NOT RE-USE., Disp: 100 each, Rfl: 3   losartan (COZAAR) 50 MG tablet, Take 50 mg by mouth 2 (two) times daily., Disp: , Rfl:    Methenamine-Sodium Salicylate (CYSTEX PO), Take by mouth., Disp: , Rfl:    Multiple Vitamin (MULTIVITAMINS PO), Take by mouth., Disp: , Rfl:    nystatin cream (MYCOSTATIN), Apply 1 application topically daily as needed., Disp: , Rfl:    OLUMIANT tablet, TAKE 1 TABLET BY MOUTH ONCE DAILY, Disp: 90 tablet, Rfl: 0   omeprazole (PRILOSEC) 40 MG capsule, Take 1 capsule (40 mg total) by mouth daily., Disp: 30 capsule, Rfl: 0   ondansetron (ZOFRAN) 4 MG tablet, Take 4 mg by mouth every 8 (eight) hours as needed for nausea or vomiting. (Patient not taking: Reported on 12/10/2021), Disp: , Rfl:    polyethylene glycol (MIRALAX / GLYCOLAX) 17 g packet, Take 17 g by mouth as needed., Disp: , Rfl:    potassium chloride SA (KLOR-CON) 20 MEQ tablet, Take 1 tablet (20 mEq total)  by mouth as needed (when you take Furosemide)., Disp: 15 tablet, Rfl: 3   rosuvastatin (CRESTOR) 5 MG tablet, Take 5 mg by mouth daily., Disp: , Rfl:    Teriparatide, Recombinant, (FORTEO) 600 MCG/2.4ML SOPN, Inject 20 mcg into the skin daily. Discard after 28 days, Disp: 2.4 mL, Rfl: 5   traMADol (ULTRAM) 50 MG tablet, Take 1 tablet (50 mg total) by mouth every 6 (six) hours as needed for moderate pain., Disp: 30 tablet, Rfl: 0   vitamin E 180 MG (400 UNITS) capsule, Take 400 Units by mouth daily., Disp: , Rfl:    zolpidem (AMBIEN) 10 MG tablet, Take 10 mg by mouth at bedtime as needed for sleep., Disp: , Rfl:   Allergies  Allergen Reactions   Meperidine Hcl     Other reaction(s): sick nausea/vomiting  Codeine Nausea And Vomiting    Other reaction(s): sick/ nausea/vomiting   Demerol [Meperidine] Nausea And Vomiting   Review of Systems Objective:  There were no vitals filed for this visit.  General: Well developed, nourished, in no acute distress, alert and oriented x3   Dermatological: Skin is warm, dry and supple bilateral. Nails x 10 are well maintained; remaining integument appears unremarkable at this time. There are no open sores, no preulcerative lesions, no rash or signs of infection present.  Ulcerative lesion with mild surrounding erythema and open hole to the dorsal aspect of the second toe right foot.  There is no ascending cellulitis no purulence no malodor.  Vascular: Dorsalis Pedis artery and Posterior Tibial artery pedal pulses are 0/4 bilateral with delayed capillary fill time. Pedal hair growth present. No varicosities and no lower extremity edema present bilateral.   Neruologic: Grossly intact via light touch bilateral. Vibratory intact via tuning fork bilateral. Protective threshold with Semmes Wienstein monofilament intact to all pedal sites bilateral. Patellar and Achilles deep tendon reflexes 2+ bilateral. No Babinski or clonus noted bilateral.   Musculoskeletal: No  gross boney pedal deformities bilateral. No pain, crepitus, or limitation noted with foot and ankle range of motion bilateral. Muscular strength 5/5 in all groups tested bilateral.  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  This toe had had surgery in the past and is fused  Gait: Assisted in gait by her husband antalgic gait   Radiographs:  Radiographs taken today demonstrate severe osteoarthritic change hallux valgus deformity of the right foot with the hallux overlapping the second toe  Assessment & Plan:   Assessment: Peripheral vascular disease severe osteoarthritis with overlapping hallux on the second toe resulting in a open ulcerative lesion.  Plan: She will dress daily with Bactroban ointment keep dry and clean continue to wear shoes that are deep.  Started her on Augmentin twice daily.  We are requesting ABIs once those come in we will most likely refer her to Dr. Posey Pronto or Dr. Sherryle Lis for reconstruction.     Marcelus Dubberly T. [place], [place]

## 2021-12-12 NOTE — Telephone Encounter (Signed)
Next Visit: 03/17/2022  Last Visit: 12/10/2021  Last Fill: 09/27/2021  DX: Rheumatoid arthritis involving multiple sites with positive rheumatoid factor   Current Dose per office note 12/10/2021: Olumiant 2 mg by mouth daily   Labs: 10/23/2021 BUN 25, RBC 3.34, Hgb 10.7, Hct 32.2  TB Gold: 02/18/2021 Neg    Okay to refill Olumiant?

## 2021-12-13 ENCOUNTER — Telehealth: Payer: Self-pay

## 2021-12-16 ENCOUNTER — Other Ambulatory Visit (HOSPITAL_COMMUNITY): Payer: Self-pay

## 2021-12-16 DIAGNOSIS — M069 Rheumatoid arthritis, unspecified: Secondary | ICD-10-CM | POA: Diagnosis not present

## 2021-12-16 DIAGNOSIS — Z961 Presence of intraocular lens: Secondary | ICD-10-CM | POA: Diagnosis not present

## 2021-12-16 DIAGNOSIS — H524 Presbyopia: Secondary | ICD-10-CM | POA: Diagnosis not present

## 2021-12-16 DIAGNOSIS — Z79899 Other long term (current) drug therapy: Secondary | ICD-10-CM | POA: Diagnosis not present

## 2021-12-16 MED ORDER — MUPIROCIN 2 % EX OINT: 1.0000 | TOPICAL_OINTMENT | Freq: Two times a day (BID) | CUTANEOUS | 0 refills | Status: DC

## 2021-12-16 NOTE — Telephone Encounter (Signed)
Sent to fill 30 days

## 2021-12-19 ENCOUNTER — Ambulatory Visit (HOSPITAL_COMMUNITY)
Admission: RE | Admit: 2021-12-19 | Discharge: 2021-12-19 | Disposition: A | Discharge status: Home / Self Care | Payer: Medicare PPO | Source: Ambulatory Visit | Attending: Podiatry | Admitting: Podiatry

## 2021-12-19 DIAGNOSIS — L97511 Non-pressure chronic ulcer of other part of right foot limited to breakdown of skin: Secondary | ICD-10-CM | POA: Diagnosis not present

## 2021-12-19 DIAGNOSIS — R0989 Other specified symptoms and signs involving the circulatory and respiratory systems: Secondary | ICD-10-CM | POA: Insufficient documentation

## 2021-12-30 ENCOUNTER — Telehealth: Payer: Self-pay | Admitting: Pharmacist

## 2021-12-30 NOTE — Telephone Encounter (Signed)
Received notification from [facility] regarding a prior authorization for [place]. Authorization has been APPROVED from 03/03/22 to 03/03/23.  Approval letter placed with provider portion of application in [place] folder  Knox Saliva, PharmD, MPH, BCPS, CPP Clinical Pharmacist (Rheumatology and Pulmonology)

## 2021-12-30 NOTE — Telephone Encounter (Signed)
Received fax from [facility] that they are taking 2024 patient assistance renewal application. Patient receives [place] through patient assistance program. Spoke with patient and husband - they submitted their portion of application a few days ago. Will    Provider portion placed in Hazel Sams, PA-C's folder to be signed   Submitted a Prior Authorization RENEWAL request to [facility] for [place] via CoverMyMeds. PA approval will need to be attached to application when submitted   Key: [id number]   Knox Saliva, PharmD, MPH, BCPS, CPP Clinical Pharmacist (Rheumatology and Pulmonology)

## 2021-12-31 NOTE — Telephone Encounter (Signed)
Submitted Patient Assistance RENEWAL Application to [employer] for OLUMIANT along with provider portion, PA, med list, insurance card copy, and income documents. Patient portion mailed in her portion directly to [facility]. Will update patient when we receive a response.  Fax# [phone] Phone# [phone]  Knox Saliva, PharmD, MPH, BCPS, CPP Clinical Pharmacist (Rheumatology and Pulmonology)

## 2022-01-06 ENCOUNTER — Other Ambulatory Visit (HOSPITAL_COMMUNITY): Payer: Self-pay

## 2022-01-08 ENCOUNTER — Other Ambulatory Visit (HOSPITAL_COMMUNITY): Payer: Self-pay

## 2022-01-09 ENCOUNTER — Other Ambulatory Visit (HOSPITAL_COMMUNITY): Payer: Self-pay

## 2022-01-13 DIAGNOSIS — L570 Actinic keratosis: Secondary | ICD-10-CM | POA: Diagnosis not present

## 2022-01-20 ENCOUNTER — Ambulatory Visit: Payer: Medicare PPO | Admitting: Podiatry

## 2022-01-20 ENCOUNTER — Encounter: Payer: Self-pay | Admitting: Podiatry

## 2022-01-20 DIAGNOSIS — M2041 Other hammer toe(s) (acquired), right foot: Secondary | ICD-10-CM | POA: Diagnosis not present

## 2022-01-20 DIAGNOSIS — M21611 Bunion of right foot: Secondary | ICD-10-CM | POA: Diagnosis not present

## 2022-01-20 DIAGNOSIS — M2011 Hallux valgus (acquired), right foot: Secondary | ICD-10-CM | POA: Diagnosis not present

## 2022-01-20 DIAGNOSIS — L97511 Non-pressure chronic ulcer of other part of right foot limited to breakdown of skin: Secondary | ICD-10-CM

## 2022-01-20 DIAGNOSIS — E559 Vitamin D deficiency, unspecified: Secondary | ICD-10-CM | POA: Diagnosis not present

## 2022-01-20 NOTE — Patient Instructions (Signed)
Call [facility] to schedule your MRI at the below locations.  Please allow at least 1 business day after your visit to process the referral.  It may take longer depending on approval from insurance.  Please let me know if you have issues or problems scheduling the MRI     [facility] [phone] [address] [place], [place] [address]  

## 2022-01-20 NOTE — Progress Notes (Signed)
  Subjective:  Patient ID: Michele Bryan, female    DOB: 09-20-1940,  MRN: [id number]  Chief Complaint  Patient presents with   Foot Ulcer    Right foot second toe     82 y.o. female presents with the above complaint. History confirmed with patient.  He is referred to me by Dr. Al Corpus.  She has severe bunion deformity and hammertoes on the right foot.  She developed an ulceration because of this on the top of the second toe.  This is where most of her pain and limitation has  Objective:  Physical Exam: warm, good capillary refill, no trophic changes or ulcerative lesions, normal DP and PT pulses, normal sensory exam, and severe hallux valgus deformity with crossover of the hallux on top of the second toe, partial-thickness ulceration with skin breakdown of the dorsal second PIPJ, no drainage cellulitis or signs of infection.  Limited range of motion of great toe   Radiographs: Multiple views x-ray of the right foot: Previous right foot radiographs were reviewed there is no evidence of osteomyelitis or erosions of the second proximal phalanx there is severe hallux valgus deformity with degenerative changes in the MTPJ of the first ray with overlap of the first and second toe Assessment:   1. Hallux valgus with bunions, right   2. Skin ulcer of second toe of right foot, limited to breakdown of skin (HCC)   3. Hammertoe of right foot   4. Vitamin D deficiency      Plan:  Patient was evaluated and treated and all questions answered.   We reviewed my clinical exam findings as well as her last radiographs together.  We discussed the severity of the hallux valgus deformity as well as the arthritis in the joint.  From a deformity standpoint I think the only reasonable correction here would be for a first metatarsophalangeal joint arthrodesis.  Chief concern primarily would be with possibility of infection of the second toe.  If there is infection here then likely second toe amputation is her  only way to proceed initially.  We discussed possibly if that was necessary that still could straighten the toe out further if needed.  We discussed if amputation of the second toe was preferred from a recovery standpoint primarily then this could lead to furthering the worsening deformity of the hallux.  I have ordered an MRI to evaluate this and we will see her back after the MRI.  Also ordered vitamin D and calcium levels to evaluate her bone healing.  She had a DEXA scan completed in May of this year her T score was -2.2 which qualifies as osteopenia.  No follow-ups on file.

## 2022-01-21 LAB — VITAMIN D 25 HYDROXY (VIT D DEFICIENCY, FRACTURES): Vit D, 25-Hydroxy: 31.4 ng/mL (ref 30.0–100.0)

## 2022-01-21 LAB — CALCIUM: Calcium: 10.3 mg/dL (ref 8.7–10.3)

## 2022-01-22 NOTE — Telephone Encounter (Signed)
Called [name] to inquire about the PAP renewal status because we received approval documents for previous patients with <1 day turn around and this has been in process for 3 weeks. Rep indicated that the provider portion was received on 10/31 but the completed patient portion was not received.   Called patient and patient's husband and they indicated that they completed an online application in late October. I advised them to complete it again and that I would send them the link to complete the application once more.   Given this new information (that they completed it online as opposed to mailing the application in), I called [name] once more to inquire specifically about the online patient application and the rep stated that they can see the application and the only thing missing from the online application is an e-signature. Rep stated that the patient would've received an email with a link to complete the e-signature (similar to DocuSign).  Called patient and patient's husband again with this new information and patient's husband was able to find multiple emails from [employer] with reminders to complete the e-signature. Patient's husband indicated that he and the patient will complete the e-signature. Informed patient's husband that we are available to answer questions should they arise.   Patient's husband called [place] to inform that they completed the e-signature. Will await decision and update patient/patient's husband when received.  [name] [employer] PharmD Candidate [phone]

## 2022-01-29 NOTE — Telephone Encounter (Signed)
Confirmed with [name] that they received the complete application. Rep indicated that she will move the renewal application into processing, which will take 24 to 48 hours. Both patient and provider will be notified of result.   Michele Bryan [employer] PharmD Candidate [phone]

## 2022-02-03 NOTE — Telephone Encounter (Signed)
Received a fax from [facility] regarding an approval for OLUMIANT patient assistance from 03/03/2022 to 03/03/2023.   Phone number: [phone]  Chesley Mires, PharmD, MPH, BCPS, CPP Clinical Pharmacist (Rheumatology and Pulmonology)

## 2022-02-04 ENCOUNTER — Ambulatory Visit
Admission: RE | Admit: 2022-02-04 | Discharge: 2022-02-04 | Disposition: A | Discharge status: Home / Self Care | Payer: Medicare PPO | Source: Ambulatory Visit | Attending: Podiatry | Admitting: Podiatry

## 2022-02-04 DIAGNOSIS — M86171 Other acute osteomyelitis, right ankle and foot: Secondary | ICD-10-CM | POA: Diagnosis not present

## 2022-02-04 DIAGNOSIS — Q6689 Other  specified congenital deformities of feet: Secondary | ICD-10-CM | POA: Diagnosis not present

## 2022-02-04 DIAGNOSIS — L97511 Non-pressure chronic ulcer of other part of right foot limited to breakdown of skin: Secondary | ICD-10-CM

## 2022-02-06 ENCOUNTER — Other Ambulatory Visit (HOSPITAL_COMMUNITY): Payer: Self-pay

## 2022-02-06 ENCOUNTER — Other Ambulatory Visit: Payer: Self-pay | Admitting: Rheumatology

## 2022-02-06 DIAGNOSIS — S129XXS Fracture of neck, unspecified, sequela: Secondary | ICD-10-CM

## 2022-02-06 DIAGNOSIS — M8589 Other specified disorders of bone density and structure, multiple sites: Secondary | ICD-10-CM

## 2022-02-06 MED ORDER — FORTEO 600 MCG/2.4ML ~~LOC~~ SOPN
PEN_INJECTOR | SUBCUTANEOUS | 2 refills | Status: DC
  Filled 2022-02-06 – 2022-02-10 (×2): qty 2.4, 28d supply, fill #0
  Filled 2022-03-24: qty 2.4, 28d supply, fill #1
  Filled 2022-05-15: qty 2.4, 28d supply, fill #2

## 2022-02-06 NOTE — Telephone Encounter (Signed)
Next Visit: 03/17/2022  Last Visit: 12/10/2021  Last Fill: 04/15/2021  DX: Osteopenia of multiple sites   Current Dose per office note on 12/10/2021: Patient remains on Forteo daily injections.   Labs: 10/23/2021 RBC 3.34, hemoglobin 10.7, HCT 32.2, BUN 25, anion gap 4  Okay to refill forteo?

## 2022-02-10 ENCOUNTER — Other Ambulatory Visit (HOSPITAL_COMMUNITY): Payer: Self-pay

## 2022-02-14 ENCOUNTER — Telehealth: Payer: Self-pay | Admitting: Pharmacist

## 2022-02-14 NOTE — Telephone Encounter (Signed)
Per Rema Jasmine, patient's PA for Forteo set to expire. Submitted a Prior Authorization RENEWAL request to [facility] for FORTEO via CoverMyMeds. Will update once we receive a response.  Key: [id number]  Chesley Mires, PharmD, MPH, BCPS, CPP Clinical Pharmacist (Rheumatology and Pulmonology)

## 2022-02-16 NOTE — Telephone Encounter (Signed)
Received notification from [facility] regarding a prior authorization for FORTEO. Authorization has been APPROVED from 02/16/22. Per letter, authorization is not needed. Letter sent to media tab of chart.  Patient can continue to fill through [facility]: [phone]   Authorization # [id number] Phone # [phone]  [employer] updated  Chesley Mires, PharmD, MPH, BCPS, CPP Clinical Pharmacist (Rheumatology and Pulmonology)

## 2022-02-18 ENCOUNTER — Other Ambulatory Visit: Payer: Self-pay | Admitting: *Deleted

## 2022-02-18 ENCOUNTER — Telehealth: Payer: Self-pay | Admitting: *Deleted

## 2022-02-18 DIAGNOSIS — Z111 Encounter for screening for respiratory tuberculosis: Secondary | ICD-10-CM | POA: Diagnosis not present

## 2022-02-18 DIAGNOSIS — Z79899 Other long term (current) drug therapy: Secondary | ICD-10-CM

## 2022-02-18 DIAGNOSIS — E785 Hyperlipidemia, unspecified: Secondary | ICD-10-CM

## 2022-02-18 NOTE — Telephone Encounter (Signed)
Michele Bryan left a message on behalf of Michele Bryan. He states patient needs refill on Olumiant. Patient only has a few left.   Returned call to Michele Bryan and advised patient is due to update labs. He states he spoke with the patient assistance program and they are ready to ship the medication asap. He states he will bring Michele Bryan to have labs today.

## 2022-02-19 NOTE — Progress Notes (Signed)
Lipid panel WNL.  Creatinine is borderline elevated.  GFR is borderline low-57.  Please advise the patient to avoid NSAID use. Rest of CMP WNL. Anemia is stable. We will continue to monitor lab work every 3 months.

## 2022-02-20 LAB — COMPLETE METABOLIC PANEL WITH GFR
AG Ratio: 2.1 (calc) (ref 1.0–2.5)
ALT: 19 U/L (ref 6–29)
AST: 25 U/L (ref 10–35)
Albumin: 4.7 g/dL (ref 3.6–5.1)
Alkaline phosphatase (APISO): 70 U/L (ref 37–153)
BUN/Creatinine Ratio: 24 (calc) — ABNORMAL HIGH (ref 6–22)
BUN: 24 mg/dL (ref 7–25)
CO2: 26 mmol/L (ref 20–32)
Calcium: 10.1 mg/dL (ref 8.6–10.4)
Chloride: 107 mmol/L (ref 98–110)
Creat: 1.01 mg/dL — ABNORMAL HIGH (ref 0.60–1.00)
Globulin: 2.2 g/dL (calc) (ref 1.9–3.7)
Glucose, Bld: 95 mg/dL (ref 65–99)
Potassium: 4.4 mmol/L (ref 3.5–5.3)
Sodium: 141 mmol/L (ref 135–146)
Total Bilirubin: 0.8 mg/dL (ref 0.2–1.2)
Total Protein: 6.9 g/dL (ref 6.1–8.1)
eGFR: 57 mL/min/{1.73_m2} — ABNORMAL LOW (ref >=60)

## 2022-02-20 LAB — CBC WITH DIFFERENTIAL/PLATELET
Absolute Monocytes: 600 cells/uL (ref 200–950)
Basophils Absolute: 42 cells/uL (ref 0–200)
Basophils Relative: 0.7 %
Eosinophils Absolute: 498 cells/uL (ref 15–500)
Eosinophils Relative: 8.3 %
HCT: 32.9 % — ABNORMAL LOW (ref 35.0–45.0)
Hemoglobin: 11.2 g/dL — ABNORMAL LOW (ref 11.7–15.5)
Lymphs Abs: 2652 cells/uL (ref 850–3900)
MCH: 32.4 pg (ref 27.0–33.0)
MCHC: 34 g/dL (ref 32.0–36.0)
MCV: 95.1 fL (ref 80.0–100.0)
MPV: 9.9 fL (ref 7.5–12.5)
Monocytes Relative: 10 %
Neutro Abs: 2208 cells/uL (ref 1500–7800)
Neutrophils Relative %: 36.8 %
Platelets: 303 10*3/uL (ref 140–400)
RBC: 3.46 10*6/uL — ABNORMAL LOW (ref 3.80–5.10)
RDW: 12 % (ref 11.0–15.0)
Total Lymphocyte: 44.2 %
WBC: 6 10*3/uL (ref 3.8–10.8)

## 2022-02-20 LAB — LIPID PANEL
Cholesterol: 182 mg/dL (ref <200)
HDL: 82 mg/dL (ref >=50)
LDL Cholesterol (Calc): 81 mg/dL (calc)
Non-HDL Cholesterol (Calc): 100 mg/dL (calc) (ref <130)
Total CHOL/HDL Ratio: 2.2 (calc) (ref <5.0)
Triglycerides: 95 mg/dL (ref <150)

## 2022-02-20 LAB — QUANTIFERON-TB GOLD PLUS
Mitogen-NIL: >10 IU/mL
NIL: 0.05 IU/mL
QuantiFERON-TB Gold Plus: NEGATIVE
TB1-NIL: 0.01 IU/mL
TB2-NIL: 0 IU/mL

## 2022-02-20 NOTE — Progress Notes (Signed)
TB gold negative

## 2022-02-25 ENCOUNTER — Ambulatory Visit: Payer: Medicare PPO | Admitting: Podiatry

## 2022-02-25 DIAGNOSIS — M2041 Other hammer toe(s) (acquired), right foot: Secondary | ICD-10-CM | POA: Diagnosis not present

## 2022-02-25 DIAGNOSIS — M21611 Bunion of right foot: Secondary | ICD-10-CM | POA: Diagnosis not present

## 2022-02-25 DIAGNOSIS — M2011 Hallux valgus (acquired), right foot: Secondary | ICD-10-CM | POA: Diagnosis not present

## 2022-02-25 NOTE — Patient Instructions (Signed)
More silicone pads can be purchased from:  [email or web address]   The pads we used are called silicone toe caps (for the second toe) and silicone corn/callus pad (for the big toe)

## 2022-03-01 NOTE — Progress Notes (Signed)
  Subjective:  Patient ID: Michele Bryan, female    DOB: 1940-10-10,  MRN: [id number]  Chief Complaint  Patient presents with   Bunions    Follow-up bunion and hammertoes right foot, top of 2nd toe ulcer, patient is doing better, MRI results     81 y.o. female presents with the above complaint. History confirmed with patient.  She says she is doing better.  She completed the MRI.  Feels the wound is healed.  Objective:  Physical Exam: warm, good capillary refill, no trophic changes or ulcerative lesions, normal DP and PT pulses, normal sensory exam, and severe hallux valgus deformity with crossover of the hallux on top of the second toe, dorsal skin breakdown has healed.  Limited range of motion of great toe   Radiographs: Multiple views x-ray of the right foot: Previous right foot radiographs were reviewed there is no evidence of osteomyelitis or erosions of the second proximal phalanx there is severe hallux valgus deformity with degenerative changes in the MTPJ of the first ray with overlap of the first and second toe   Vitamin D level 31.4  Study Result  Narrative & Impression  CLINICAL DATA:  Osteomyelitis, foot   EXAM: MRI OF THE RIGHT TOES WITHOUT CONTRAST   TECHNIQUE: Multiplanar, multisequence MR imaging of the right toes was performed. No intravenous contrast was administered.   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 There is also lateral angulation at the great toe IP joint.   Lateral angulation of the second toe with solid bony fusion of the PIP joint. Third through fifth digit claw toes.   There is no definite marrow signal alteration to suggest osteomyelitis.   Ligaments   Intact Lisfranc ligament.   Muscles and Tendons   There is significant muscle atrophy in the forefoot, compatible with denervation change.   Soft tissues   Mild  forefoot soft tissue swelling, prominent in the second toe. No focal fluid collection.   IMPRESSION: Mild forefoot soft tissue swelling, prominent in the second toe. No evidence of osteomyelitis or soft tissue abscess.   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   Multiple toe deformities as described above.     Electronically Signed   By: Caprice Renshaw M.D.   On: 02/05/2022 09:14   Assessment:   1. Hallux valgus with bunions, right   2. Hammertoe of right foot      Plan:  Patient was evaluated and treated and all questions answered.   Seems to be improving the wound has healed.  We reviewed the results of her MRI there is no evidence of osteomyelitis.  We discussed treatment again of her deformities including surgical and nonsurgical treatment.  I would like to trial nonsurgical treatment with offloading silicone pads.  These were dispensed.  I will see her back in 6 weeks for follow-up, if continue to be painful or bothersome despite the padding we will plan for surgical correction.  Return in about 6 weeks (around 04/08/2022) for follow up on bunion and toes causing sores.

## 2022-03-04 ENCOUNTER — Other Ambulatory Visit (HOSPITAL_COMMUNITY): Payer: Self-pay

## 2022-03-06 NOTE — Progress Notes (Deleted)
Office Visit Note  Patient: Michele Bryan             Date of Birth: 1940-08-12           MRN: [id number]             PCP: Kathyrn Lass, MD Referring: Kathyrn Lass, MD Visit Date: 03/17/2022 Occupation: @GUAROCC$ @  Subjective:  No chief complaint on file.   History of Present Illness: Michele Bryan is a 82 y.o. female ***     Activities of Daily Living:  Patient reports morning stiffness for *** {minute/hour:19697}.   Patient {ACTIONS;DENIES/REPORTS:21021675::"Denies"} nocturnal pain.  Difficulty dressing/grooming: {ACTIONS;DENIES/REPORTS:21021675::"Denies"} Difficulty climbing stairs: {ACTIONS;DENIES/REPORTS:21021675::"Denies"} Difficulty getting out of chair: {ACTIONS;DENIES/REPORTS:21021675::"Denies"} Difficulty using hands for taps, buttons, cutlery, and/or writing: {ACTIONS;DENIES/REPORTS:21021675::"Denies"}  No Rheumatology ROS completed.   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   Gait abnormality 03/08/2020   Postoperative pain    Benign essential HTN    Constipation    Anemia of chronic disease    Essential hypertension    Orthostatic hypotension    Cervical myelopathy (HCC) 02/21/2020   Excoriation of ear canal, left, initial encounter 01/17/2020   Impacted cerumen of left ear 01/17/2020   Closed cervical spine fracture ([place]) 12/06/2019   Scapula fracture 03/05/2018   Fracture of acromial process of scapula, closed 02/15/2018   Chronic left shoulder pain 11/25/2017   Left cervical radiculopathy 09/29/2017   S/P shoulder replacement, left 05/15/2017   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  05/06/2016   High risk medication use 05/06/2016   Primary osteoarthritis of both hands 05/06/2016   Primary osteoarthritis of left knee 05/06/2016   Primary osteoarthritis of both feet 05/06/2016    Dyslipidemia 05/06/2016   Peripheral neuropathy 05/06/2016   Diverticulosis of intestine without bleeding 05/06/2016   Osteopenia  05/06/2016   Vitamin D deficiency 05/06/2016   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   History of GI bleed 05/06/2016   Cataract of both eyes 05/06/2016   Degenerative joint disease of hand 05/06/2016   Osteoarthritis of foot joint 05/06/2016   Urinary urgency 04/30/2015   Chronic constipation 04/30/2015   MS (multiple sclerosis) ([place]) 03/23/2013   Expected blood loss anemia 06/16/2012   S/P right TKA 06/15/2012    Past Medical History:  Diagnosis Date   Anemia    Arthritis    GERD (gastroesophageal reflux disease)    History of blood transfusion    Hyperlipidemia    Hypertension    Lower extremity edema    MS (multiple sclerosis) (HCC)    Neuromuscular disorder (HCC)    multiple scleroosis/peripheral neuropathy   PONV (postoperative nausea and vomiting)    Rheumatoid arthritis ([place])    Ulcer    Vitamin D deficiency     Family History  Problem Relation Age of Onset   Heart attack Father    Breast cancer Neg Hx    Past Surgical History:  Procedure Laterality Date   APPLICATION OF INTRAOPERATIVE CT SCAN N/A 02/16/2020   Procedure: APPLICATION OF INTRAOPERATIVE CT SCAN;  Surgeon: Judith Part, MD;  Location: [place];  Service: Neurosurgery;  Laterality: N/A;   AUGMENTATION MAMMAPLASTY     BACK SURGERY     breast augumentation     BREAST SURGERY     biopsy   COLONOSCOPY     DILATION AND CURETTAGE  OF UTERUS     EYE SURGERY     both eys,cataracts   goiter     HALO APPLICATION N/A [id number]   Procedure: HALO TRACTION APPLICATION;  Surgeon: Judith Part, MD;  Location: [place];  Service: Neurosurgery;  Laterality: N/A;   JOINT REPLACEMENT     KNEE ARTHROSCOPY Right    ORIF SHOULDER FRACTURE Left 03/05/2018   Procedure: OPEN REDUCTION INTERNAL FIXATION (ORIF) LEFT SCAPULA;  Surgeon: Netta Cedars, MD;  Location: [place];   Service: Orthopedics;  Laterality: Left;   POSTERIOR CERVICAL FUSION/FORAMINOTOMY N/A 12/06/2019   Procedure: Cervical one-two Posterior instrumented fusion;  Surgeon: Judith Part, MD;  Location: [place];  Service: Neurosurgery;  Laterality: N/A;   POSTERIOR CERVICAL FUSION/FORAMINOTOMY N/A 02/13/2020   Procedure: Revision of cervical instrumented fusion with Occiput to Cervical Four posterior instrumented fusion;  Surgeon: Judith Part, MD;  Location: [place];  Service: Neurosurgery;  Laterality: N/A;  posterior   POSTERIOR CERVICAL FUSION/FORAMINOTOMY N/A 02/16/2020   Procedure: Occiput to Thoracic Two Posterior Cervical Fusion with AIRO and Application of Halo;  Surgeon: Judith Part, MD;  Location: [place];  Service: Neurosurgery;  Laterality: N/A;   REVERSE SHOULDER ARTHROPLASTY Left 05/15/2017   Procedure: LEFT REVERSE SHOULDER ARTHROPLASTY;  Surgeon: Netta Cedars, MD;  Location: [place];  Service: Orthopedics;  Laterality: Left;   REVERSE SHOULDER ARTHROPLASTY Left 10/11/2018   Procedure: left shoulder irrigation and debridement, open poly exchange and removal of painful hardware;  Surgeon: Netta Cedars, MD;  [facility];  Service: Orthopedics;  Laterality: Left;   SHOULDER HEMI-ARTHROPLASTY Left 12/11/2020   Procedure: Left reverse shoulder conversion to SHOULDER HEMI-ARTHROPLASTY;  Surgeon: Netta Cedars, MD;  Location[facility];  Service: Orthopedics;  Laterality: Left;  with ISB   THYROID LOBECTOMY     TOTAL KNEE ARTHROPLASTY Right 06/15/2012   Procedure: RIGHT TOTAL KNEE ARTHROPLASTY;  Surgeon: Mauri Pole, MD;  Location[facility];  Service: Orthopedics;  Laterality: Right;   WRIST SURGERY Left    Social History   Social History Narrative   Patient lives at home with her husband [name]).   Retired.   Education- college.   Caffeine- Two cups of decaf tea daily.   Right handed.         Immunization History  Administered Date(s) Administered   Influenza, High  Dose Seasonal PF 12/03/2013   Influenza,inj,quad, With Preservative 11/23/2014   Influenza-Unspecified 12/19/2017   PFIZER(Purple Top)SARS-COV-2 Vaccination 04/16/2019, 05/29/2019, 01/09/2020   Td 12/15/2017   Td (Adult),5 Lf Tetanus Toxid, Preservative Free 12/15/2017   Zoster Recombinat (Shingrix) 08/10/2017, 01/05/2018   Zoster, Unspecified 08/10/2017, 01/05/2018     Objective: Vital Signs: There were no vitals taken for this visit.   Physical Exam   Musculoskeletal Exam: ***  CDAI Exam: CDAI Score: -- Patient Global: --; Provider Global: -- Swollen: --; Tender: -- Joint Exam 03/17/2022   No joint exam has been documented for this visit   There is currently no information documented on the homunculus. Go to the Rheumatology activity and complete the homunculus joint exam.  Investigation: No additional findings.  Imaging: No results found.  Recent Labs: Lab Results  Component Value Date   WBC 6.0 02/18/2022   HGB 11.2 (L) 02/18/2022   PLT 303 02/18/2022   NA 141 02/18/2022   K 4.4 02/18/2022   CL 107 02/18/2022   CO2 26 02/18/2022   GLUCOSE 95 02/18/2022   BUN 24 02/18/2022   CREATININE 1.01 (H) 02/18/2022  BILITOT 0.8 02/18/2022   ALKPHOS 62 10/23/2021   AST 25 02/18/2022   ALT 19 02/18/2022   PROT 6.9 02/18/2022   ALBUMIN 4.6 10/23/2021   CALCIUM 10.1 02/18/2022   GFRAA 98 04/03/2020   QFTBGOLDPLUS NEGATIVE 02/18/2022    Speciality Comments: PLQ Eye Exam:12/16/2021 @ [facility] Follow up 1 year Osteoporosis managed by PCP-ACY 10/12/2018 Rinvoq-diarrhea, Xeljanz-12/19/20  Procedures:  No procedures performed Allergies: Meperidine hcl, Codeine, and Demerol [meperidine]   Assessment / Plan:     Visit Diagnoses: No diagnosis found.  Orders: No orders of the defined types were placed in this encounter.  No orders of the defined types were placed in this encounter.   Face-to-face time spent with patient was *** minutes. Greater than 50% of  time was spent in counseling and coordination of care.  Follow-Up Instructions: No follow-ups on file.   Earnestine Mealing, CMA  Note - This record has been created using Editor, commissioning.  Chart creation errors have been sought, but may not always  have been located. Such creation errors do not reflect on  the standard of medical care.

## 2022-03-10 ENCOUNTER — Other Ambulatory Visit: Payer: Self-pay | Admitting: Physician Assistant

## 2022-03-10 DIAGNOSIS — M0579 Rheumatoid arthritis with rheumatoid factor of multiple sites without organ or systems involvement: Secondary | ICD-10-CM

## 2022-03-10 NOTE — Telephone Encounter (Signed)
Next Visit: 03/17/2022   Last Visit: 12/10/2021   Last Fill: 09/09/2021   DX: Rheumatoid arthritis involving multiple sites with positive rheumatoid factor    Current Dose per office note 12/10/2021: plaquenil 200 mg 1 tablet by mouth daily   PLQ Eye Exam:12/16/2021   Labs: 02/18/2022 Creatinine is borderline elevated.  GFR is borderline low-57. Rest of CMP WNL. Anemia is stable.   Okay to refill PLQ?

## 2022-03-17 ENCOUNTER — Ambulatory Visit: Payer: Medicare PPO | Admitting: Physician Assistant

## 2022-03-17 DIAGNOSIS — M4004 Postural kyphosis, thoracic region: Secondary | ICD-10-CM

## 2022-03-17 DIAGNOSIS — M8589 Other specified disorders of bone density and structure, multiple sites: Secondary | ICD-10-CM

## 2022-03-17 DIAGNOSIS — S129XXS Fracture of neck, unspecified, sequela: Secondary | ICD-10-CM

## 2022-03-17 DIAGNOSIS — Z79899 Other long term (current) drug therapy: Secondary | ICD-10-CM

## 2022-03-17 DIAGNOSIS — E785 Hyperlipidemia, unspecified: Secondary | ICD-10-CM

## 2022-03-17 DIAGNOSIS — M19072 Primary osteoarthritis, left ankle and foot: Secondary | ICD-10-CM

## 2022-03-17 DIAGNOSIS — Z8639 Personal history of other endocrine, nutritional and metabolic disease: Secondary | ICD-10-CM

## 2022-03-17 DIAGNOSIS — M0579 Rheumatoid arthritis with rheumatoid factor of multiple sites without organ or systems involvement: Secondary | ICD-10-CM

## 2022-03-17 DIAGNOSIS — M19042 Primary osteoarthritis, left hand: Secondary | ICD-10-CM

## 2022-03-17 DIAGNOSIS — M1712 Unilateral primary osteoarthritis, left knee: Secondary | ICD-10-CM

## 2022-03-17 DIAGNOSIS — Z8669 Personal history of other diseases of the nervous system and sense organs: Secondary | ICD-10-CM

## 2022-03-17 DIAGNOSIS — Z96651 Presence of right artificial knee joint: Secondary | ICD-10-CM

## 2022-03-17 DIAGNOSIS — Z96612 Presence of left artificial shoulder joint: Secondary | ICD-10-CM

## 2022-03-17 DIAGNOSIS — G35 Multiple sclerosis: Secondary | ICD-10-CM

## 2022-03-17 DIAGNOSIS — Z8719 Personal history of other diseases of the digestive system: Secondary | ICD-10-CM

## 2022-03-19 NOTE — Progress Notes (Unsigned)
Office Visit Note  Patient: Michele Bryan             Date of Birth: Oct 31, 1940           MRN: [id number]             PCP: Sigmund Hazel, MD Referring: Sigmund Hazel, MD Visit Date: 04/01/2022 Occupation: @GUAROCC @  Subjective:  Chronic arthralgias   History of Present Illness: Michele Bryan is a 82 y.o. female with history of seropositive rheumatoid arthritis and osteoarthritis.  She remains on Olumiant 2 mg by mouth daily and plaquenil 200 mg 1 tablet by mouth daily.  She is tolerating combination therapy without any side effects.  Patient continues to have soreness and inflammation in the right third and fourth PIP joints.  She has also had some increased discomfort in her right shoulder joint.  She states that her left shoulder and C-spine have been stable overall.  She has been following up closely with Dr. 76 for an ulcer on the right second toe due to overcrowding and a bunion of the right great toe.  She has been applying mupirocin ointment daily and using a piece of gauze between her right 1st and 2nd toes.  She has not been holding olumiant.   She denies any other recent or recurrent infections.    Activities of Daily Living:  Patient reports morning stiffness for 6 hours.   Patient Reports nocturnal pain.  Difficulty dressing/grooming: Denies Difficulty climbing stairs: Reports Difficulty getting out of chair: Reports Difficulty using hands for taps, buttons, cutlery, and/or writing: Reports  Review of Systems  Constitutional:  Negative for fatigue.  HENT:  Negative for mouth sores and mouth dryness.   Eyes:  Negative for dryness.  Respiratory:  Negative for shortness of breath.   Cardiovascular:  Negative for chest pain and palpitations.  Gastrointestinal:  Positive for constipation. Negative for blood in stool and diarrhea.  Endocrine: Negative for increased urination.  Genitourinary:  Negative for involuntary urination.  Musculoskeletal:  Positive for joint  pain, gait problem, joint pain, joint swelling, myalgias, muscle weakness, morning stiffness, muscle tenderness and myalgias.  Skin:  Negative for color change, rash, hair loss and sensitivity to sunlight.  Allergic/Immunologic: Negative for susceptible to infections.  Neurological:  Negative for dizziness and headaches.  Hematological:  Negative for swollen glands.  Psychiatric/Behavioral:  Positive for sleep disturbance. Negative for depressed mood. The patient is not nervous/anxious.     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   Gait abnormality 03/08/2020   Postoperative pain    Benign essential HTN    Constipation    Anemia of chronic disease    Essential hypertension    Orthostatic hypotension    Cervical myelopathy (HCC) 02/21/2020   Excoriation of ear canal, left, initial encounter 01/17/2020   Impacted cerumen of left ear 01/17/2020   Closed cervical spine fracture (HCC) 12/06/2019   Scapula fracture 03/05/2018   Fracture of acromial process of scapula, closed 02/15/2018   Chronic left shoulder pain 11/25/2017   Left cervical radiculopathy 09/29/2017   S/P shoulder replacement, left 05/15/2017   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  05/06/2016   High risk medication use 05/06/2016   Primary osteoarthritis of both hands 05/06/2016   Primary osteoarthritis of left knee 05/06/2016   Primary osteoarthritis of both feet 05/06/2016  Dyslipidemia 05/06/2016   Peripheral neuropathy 05/06/2016   Diverticulosis of intestine without bleeding 05/06/2016   Osteopenia  05/06/2016   Vitamin D deficiency 05/06/2016   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   History of GI bleed 05/06/2016   Cataract of both eyes 05/06/2016   Degenerative joint disease of hand 05/06/2016   Osteoarthritis of foot joint  05/06/2016   Urinary urgency 04/30/2015   Chronic constipation 04/30/2015   MS (multiple sclerosis) (HCC) 03/23/2013   Expected blood loss anemia 06/16/2012   S/P right TKA 06/15/2012    Past Medical History:  Diagnosis Date   Anemia    Arthritis    GERD (gastroesophageal reflux disease)    History of blood transfusion    Hyperlipidemia    Hypertension    Lower extremity edema    MS (multiple sclerosis) (HCC)    Neuromuscular disorder (HCC)    multiple scleroosis/peripheral neuropathy   PONV (postoperative nausea and vomiting)    Rheumatoid arthritis (HCC)    Ulcer    Vitamin D deficiency     Family History  Problem Relation Age of Onset   Heart attack Father    Breast cancer Neg Hx    Past Surgical History:  Procedure Laterality Date   APPLICATION OF INTRAOPERATIVE CT SCAN N/A 02/16/2020   Procedure: APPLICATION OF INTRAOPERATIVE CT SCAN;  Surgeon: Jadene Pierini, MD;  Location[facility];  Service: Neurosurgery;  Laterality: N/A;   AUGMENTATION MAMMAPLASTY     BACK SURGERY     breast augumentation     BREAST SURGERY     biopsy   COLONOSCOPY     DILATION AND CURETTAGE OF UTERUS     EYE SURGERY     both eys,cataracts   goiter     HALO APPLICATION N/A 02/16/2020   Procedure: HALO TRACTION APPLICATION;  Surgeon: Jadene Pierini, MD;  Location: [facility];  Service: Neurosurgery;  Laterality: N/A;   JOINT REPLACEMENT     KNEE ARTHROSCOPY Right    ORIF SHOULDER FRACTURE Left 03/05/2018   Procedure: OPEN REDUCTION INTERNAL FIXATION (ORIF) LEFT SCAPULA;  Surgeon: Beverely Low, MD;  [facility];  Service: Orthopedics;  Laterality: Left;   POSTERIOR CERVICAL FUSION/FORAMINOTOMY N/A 12/06/2019   Procedure: Cervical one-two Posterior instrumented fusion;  Surgeon: Jadene Pierini, MD;  Location[facility];  Service: Neurosurgery;  Laterality: N/A;   POSTERIOR CERVICAL FUSION/FORAMINOTOMY N/A 02/13/2020   Procedure: Revision of cervical instrumented fusion with Occiput to  Cervical Four posterior instrumented fusion;  Surgeon: Jadene Pierini, MD;  Location[facility];  Service: Neurosurgery;  Laterality: N/A;  posterior   POSTERIOR CERVICAL FUSION/FORAMINOTOMY N/A 02/16/2020   Procedure: Occiput to Thoracic Two Posterior Cervical Fusion with AIRO and Application of Halo;  Surgeon: Jadene Pierini, MD;  Location: [facility];  Service: Neurosurgery;  Laterality: N/A;   REVERSE SHOULDER ARTHROPLASTY Left 05/15/2017   Procedure: LEFT REVERSE SHOULDER ARTHROPLASTY;  Surgeon: Beverely Low, MD;  Location: [facility];  Service: Orthopedics;  Laterality: Left;   REVERSE SHOULDER ARTHROPLASTY Left 10/11/2018   Procedure: left shoulder irrigation and debridement, open poly exchange and removal of painful hardware;  Surgeon: Beverely Low, MD;  [facility];  Service: Orthopedics;  Laterality: Left;   SHOULDER HEMI-ARTHROPLASTY Left 12/11/2020   Procedure: Left reverse shoulder conversion to SHOULDER HEMI-ARTHROPLASTY;  Surgeon: Beverely Low, MD;  [facility];  Service: Orthopedics;  Laterality: Left;  with ISB   THYROID LOBECTOMY     TOTAL KNEE ARTHROPLASTY Right  06/15/2012   Procedure: RIGHT TOTAL KNEE ARTHROPLASTY;  Surgeon: [name], MD;  [facility];  Service: Orthopedics;  Laterality: Right;   WRIST SURGERY Left    Social History   Social History Narrative   Patient lives at home with her husband [name]).   Retired.   Education- college.   Caffeine- Two cups of decaf tea daily.   Right handed.         Immunization History  Administered Date(s) Administered   Influenza, High Dose Seasonal PF 12/03/2013   Influenza,inj,quad, With Preservative 11/23/2014   Influenza-Unspecified 12/19/2017   PFIZER(Purple Top)SARS-COV-2 Vaccination 04/16/2019, 05/29/2019, 01/09/2020   Td 12/15/2017   Td (Adult),5 Lf Tetanus Toxid, Preservative Free 12/15/2017   Zoster Recombinat (Shingrix) 08/10/2017, 01/05/2018   Zoster, Unspecified 08/10/2017, 01/05/2018      Objective: Vital Signs: BP (!) 148/67 (BP Location: Left Arm, Patient Position: Sitting, Cuff Size: Normal)   Pulse 61   Resp 16   Ht 5\' 3"  (1.6 m)   Wt 116 lb (52.6 kg)   BMI 20.55 kg/m    Physical Exam Vitals and nursing note reviewed.  Constitutional:      Appearance: She is well-developed.  HENT:     Head: Normocephalic and atraumatic.  Eyes:     Conjunctiva/sclera: Conjunctivae normal.  Cardiovascular:     Rate and Rhythm: Normal rate and regular rhythm.     Heart sounds: Normal heart sounds.  Pulmonary:     Effort: Pulmonary effort is normal.     Breath sounds: Normal breath sounds.  Abdominal:     General: Bowel sounds are normal.     Palpations: Abdomen is soft.  Musculoskeletal:     Cervical back: Normal range of motion.  Skin:    General: Skin is warm and dry.     Capillary Refill: Capillary refill takes less than 2 seconds.  Neurological:     Mental Status: She is alert and oriented to person, place, and time.  Psychiatric:        Behavior: Behavior normal.      Musculoskeletal Exam: C-spine has severely limited ROM.  Thoracic kyphosis.   Painful limited range of motion about shoulder joints. Left shoulder is replaced.  Elbow joints have good range of motion.  Severe CMC joint thickening and tenderness bilaterally.  Tenderness and mild inflammation in the right third and fourth PIP joints.  Hip joints have good range of motion with no groin pain.  Right knee replacement has good ROM.  Left knee joint has good ROM with no warmth or effusion.  Ankle joints have good ROM with no tenderness or joint swelling.     CDAI Exam: CDAI Score: -- Patient Global: 5 mm; Provider Global: 5 mm Swollen: --; Tender: -- Joint Exam 04/01/2022   No joint exam has been documented for this visit   There is currently no information documented on the homunculus. Go to the Rheumatology activity and complete the homunculus joint exam.  Investigation: No additional  findings.  Imaging: No results found.  Recent Labs: Lab Results  Component Value Date   WBC 6.0 02/18/2022   HGB 11.2 (L) 02/18/2022   PLT 303 02/18/2022   NA 141 02/18/2022   K 4.4 02/18/2022   CL 107 02/18/2022   CO2 26 02/18/2022   GLUCOSE 95 02/18/2022   BUN 24 02/18/2022   CREATININE 1.01 (H) 02/18/2022   BILITOT 0.8 02/18/2022   ALKPHOS 62 10/23/2021   AST 25 02/18/2022   ALT  19 02/18/2022   PROT 6.9 02/18/2022   ALBUMIN 4.6 10/23/2021   CALCIUM 10.1 02/18/2022   GFRAA 98 04/03/2020   QFTBGOLDPLUS NEGATIVE 02/18/2022    Speciality Comments: PLQ Eye Exam:12/16/2021 @ [facility] Follow up 1 year Osteoporosis managed by PCP-ACY 10/12/2018 Rinvoq-diarrhea, Xeljanz-12/19/20  Procedures:  No procedures performed Allergies: Meperidine hcl, Codeine, and Demerol [meperidine]   Assessment / Plan:     Visit Diagnoses: Rheumatoid arthritis involving multiple sites with positive rheumatoid factor ([place]): Patient continues to experience chronic arthralgias and joint stiffness.  She has ongoing tenderness and inflammation in the right third and fourth PIP joints but no synovitis over the wrist joints or MCP joints noted.  She remains on Olumiant 2 mg by mouth daily and Plaquenil 200 mg 1 tablet by mouth daily.  She is tolerating combination therapy without any side effects.  Overall her rheumatoid arthritis has been stable on the current treatment regimen.  No medication changes will be made at this time.  She was advised to notify us if she develops signs or symptoms of a flare.  She will follow-up in the office in 3 months or sooner if needed.  High risk medication use - Olumiant 2 mg by mouth daily and plaquenil 200 mg 1 tablet by mouth daily. PLQ Eye Exam:12/16/2021 CBC and CMP updated on 02/18/22. Her next lab work will be due in March and every 3 months.  Standing orders for CBC and CMP remain in place.  TB gold negative on 02/18/22.  Lipid panel WNL on 02/18/22.   Encouraged the patient to hold Olumiant until the wound on the right 2nd hammertoe has healed. Discussed the importance of holding olumiant if she develops signs or symptoms of an infection and to resume once the infection has completely cleared.   Counseled on the increase risk of venous thrombosis. Counseled about FDA black box warning of MACE (major adverse CV events including cardiovascular death, myocardial infarction, and stroke).    H/O total shoulder replacement, left - Revision x2 performed by Dr. Alma Friendly.  Chronic pain.  Primary osteoarthritis of both hands: PIP and DIP thickening consistent with osteoarthritis of both hands.  Severe CMC joint thickening and tenderness.  Discussed the importance of joint protection and muscle strengthening.  Status post right knee replacement: Good ROM with no warmth or effusion.    Primary osteoarthritis of left knee: No warmth or effusion noted.   Primary osteoarthritis of both feet - Severe osteoarthritis in her feet.  Right bunion has progressed and is causing overcrowding and overlap with adjacent toes.  Under the care of Dr. Sherryle Lis.  She has a wound on the right second hammertoe due to hallux valgus deformity secondary to a bunion of the right great toe.  She has been applying Bactroban ointment topically and using a piece of gauze to avoid friction.  Encouraged patient to hold Olumiant to allow the wound to completely heal.   Closed fracture of cervical vertebra, unspecified cervical vertebral level, sequela - Fall in January 2020-she fractured C1 and C2 with displacement requiring fusion on 12/06/2019.  Osteopenia of multiple sites - 07/10/2021 DEXA [place] imaging showed BMD 0.728, T score -2.2 in the right femoral neck. Forteo daily injections. She initiated Forteo in February 2022. Different treatment options will be discussed at her follow up visit.   Postural kyphosis of thoracic region: No midline spinal tenderness.    History of vitamin  D deficiency: She is taking vitamin D 2000 units daily.  Other medical conditions are listed as follows:   History of diverticulosis  History of GI bleed  History of multiple sclerosis ([place])  History of peripheral neuropathy  History of cataract  Dyslipidemia  Orders: No orders of the defined types were placed in this encounter.  No orders of the defined types were placed in this encounter.     Follow-Up Instructions: Return in about 3 months (around 07/01/2022).   Ofilia Neas, PA-C  Note - This record has been created using Dragon software.  Chart creation errors have been sought, but may not always  have been located. Such creation errors do not reflect on  the standard of medical care.

## 2022-03-24 ENCOUNTER — Other Ambulatory Visit (HOSPITAL_COMMUNITY): Payer: Self-pay

## 2022-03-25 ENCOUNTER — Other Ambulatory Visit: Payer: Self-pay

## 2022-03-27 ENCOUNTER — Other Ambulatory Visit (HOSPITAL_COMMUNITY): Payer: Self-pay

## 2022-04-01 ENCOUNTER — Ambulatory Visit: Payer: Medicare PPO | Attending: Physician Assistant | Admitting: Physician Assistant

## 2022-04-01 ENCOUNTER — Encounter: Payer: Self-pay | Admitting: Physician Assistant

## 2022-04-01 VITALS — BP 148/67 | HR 61 | Resp 16 | Ht 63.0 in | Wt 116.0 lb

## 2022-04-01 DIAGNOSIS — M8589 Other specified disorders of bone density and structure, multiple sites: Secondary | ICD-10-CM

## 2022-04-01 DIAGNOSIS — M19041 Primary osteoarthritis, right hand: Secondary | ICD-10-CM

## 2022-04-01 DIAGNOSIS — S129XXS Fracture of neck, unspecified, sequela: Secondary | ICD-10-CM | POA: Diagnosis not present

## 2022-04-01 DIAGNOSIS — Z8719 Personal history of other diseases of the digestive system: Secondary | ICD-10-CM

## 2022-04-01 DIAGNOSIS — G35 Multiple sclerosis: Secondary | ICD-10-CM

## 2022-04-01 DIAGNOSIS — M4004 Postural kyphosis, thoracic region: Secondary | ICD-10-CM

## 2022-04-01 DIAGNOSIS — M19042 Primary osteoarthritis, left hand: Secondary | ICD-10-CM

## 2022-04-01 DIAGNOSIS — M0579 Rheumatoid arthritis with rheumatoid factor of multiple sites without organ or systems involvement: Secondary | ICD-10-CM

## 2022-04-01 DIAGNOSIS — M1712 Unilateral primary osteoarthritis, left knee: Secondary | ICD-10-CM | POA: Diagnosis not present

## 2022-04-01 DIAGNOSIS — Z96612 Presence of left artificial shoulder joint: Secondary | ICD-10-CM

## 2022-04-01 DIAGNOSIS — E785 Hyperlipidemia, unspecified: Secondary | ICD-10-CM

## 2022-04-01 DIAGNOSIS — M19071 Primary osteoarthritis, right ankle and foot: Secondary | ICD-10-CM | POA: Diagnosis not present

## 2022-04-01 DIAGNOSIS — Z79899 Other long term (current) drug therapy: Secondary | ICD-10-CM

## 2022-04-01 DIAGNOSIS — Z8669 Personal history of other diseases of the nervous system and sense organs: Secondary | ICD-10-CM

## 2022-04-01 DIAGNOSIS — Z96651 Presence of right artificial knee joint: Secondary | ICD-10-CM | POA: Diagnosis not present

## 2022-04-01 DIAGNOSIS — M19072 Primary osteoarthritis, left ankle and foot: Secondary | ICD-10-CM

## 2022-04-01 DIAGNOSIS — Z8639 Personal history of other endocrine, nutritional and metabolic disease: Secondary | ICD-10-CM

## 2022-04-08 ENCOUNTER — Ambulatory Visit: Payer: Medicare PPO | Admitting: Podiatry

## 2022-04-08 DIAGNOSIS — L97511 Non-pressure chronic ulcer of other part of right foot limited to breakdown of skin: Secondary | ICD-10-CM

## 2022-04-08 DIAGNOSIS — M2041 Other hammer toe(s) (acquired), right foot: Secondary | ICD-10-CM

## 2022-04-10 NOTE — Progress Notes (Signed)
Subjective:  Patient ID: Michele Bryan, female    DOB: 1940-11-27,  MRN: [id number]  Chief Complaint  Patient presents with   Bunions    6 weeks (around 04/08/2022) for follow up on bunion and toes causing sores. Patient states she is about the same, definitely not worse    82 y.o. female presents with the above complaint. History confirmed with patient.  Wound has returned again.  Still feels about the same.  Bothersome and difficult to wear shoes  Objective:  Physical Exam: warm, good capillary refill, no trophic changes or ulcerative lesions, normal DP and PT pulses, normal sensory exam, and severe hallux valgus deformity with crossover of the hallux on top of the second toe, dorsal skin breakdown has recurred with partial-thickness ulceration.  Limited range of motion of great toe   Radiographs: Multiple views x-ray of the right foot: Previous right foot radiographs were reviewed there is no evidence of osteomyelitis or erosions of the second proximal phalanx there is severe hallux valgus deformity with degenerative changes in the MTPJ of the first ray with overlap of the first and second toe   Vitamin D level 31.4  Study Result  Narrative & Impression  CLINICAL DATA:  Osteomyelitis, foot   EXAM: MRI OF THE RIGHT TOES WITHOUT CONTRAST   TECHNIQUE: Multiplanar, multisequence MR imaging of the right toes was performed. No intravenous contrast was administered.   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 There is also lateral angulation at the great toe IP joint.   Lateral angulation of the second toe with solid bony fusion of the PIP joint. Third through fifth digit claw toes.   There is no definite marrow signal alteration to suggest osteomyelitis.   Ligaments   Intact Lisfranc ligament.   Muscles and Tendons   There is significant muscle atrophy in  the forefoot, compatible with denervation change.   Soft tissues   Mild forefoot soft tissue swelling, prominent in the second toe. No focal fluid collection.   IMPRESSION: Mild forefoot soft tissue swelling, prominent in the second toe. No evidence of osteomyelitis or soft tissue abscess.   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   Multiple toe deformities as described above.     Electronically Signed   By: Maurine Simmering M.D.   On: 02/05/2022 09:14   Assessment:   1. Hammertoe of right foot   2. Skin ulcer of second toe of right foot, limited to breakdown of skin ([place])      Plan:  Patient was evaluated and treated and all questions answered.  Unfortunately has worsened again and she was reulceration of the dorsal second toe with crossover deformity of the hallux on the hammertoe.  At this point I think would be best with her comorbidities and osteopenia to proceed with amputation of the second toe, will be best for her from the standpoint of recovery with minimal impact on function.  Discussed risk benefits and potential complications of the procedure including but not limited to pain, swelling, infection, scar, numbness which may be temporary or permanent, chronic pain, stiffness, nerve pain or damage, wound healing problems, bone healing problems including delayed or non-union.  Postoperative recovery discussed.  Informed consent signed and reviewed.  All questions addressed.  No guarantees were given.   Surgical plan:  Procedure: -Amputation right second toe  Location: -[facility]  Anesthesia plan: -IV sedation with local field  block  Postoperative pain plan: - Tylenol 1000 mg every 6 hours, tramadol 50 mg every 6 hours as needed  DVT prophylaxis: -None required  WB Restrictions / DME needs: -WBAT in surgical shoe which she has at home and will bring to surgery    No follow-ups on file.

## 2022-04-17 ENCOUNTER — Telehealth: Payer: Self-pay | Admitting: Urology

## 2022-04-17 NOTE — Telephone Encounter (Signed)
DOS 05/16/22  AMPUTATION 2ND RIGHT --- [id number]  [employer]  PER [place] [employer] FOR CPT CODE 09811 NO PRIOR AUTH IS REQUIRED.

## 2022-04-22 ENCOUNTER — Other Ambulatory Visit (HOSPITAL_COMMUNITY): Payer: Self-pay

## 2022-04-24 ENCOUNTER — Other Ambulatory Visit (HOSPITAL_COMMUNITY): Payer: Self-pay

## 2022-04-29 ENCOUNTER — Telehealth: Payer: Self-pay

## 2022-04-29 NOTE — Telephone Encounter (Signed)
Patient called and asked if she should continue her Olumiant. Patient states she is about to have amputation surgery on her right 2nd toe on 05/16/2022. Patient's call back number is [phone]. Please Advise. Thanks.

## 2022-04-29 NOTE — Telephone Encounter (Signed)
It is recommended to stop Olumiant 1 week prior to the surgery and resume 2 weeks after the surgery if there is no infection and she has clearance from the surgeon

## 2022-04-29 NOTE — Telephone Encounter (Signed)
Patient advised it is recommended to stop Olumiant 1 week prior to the surgery and resume 2 weeks after the surgery if there is no infection and she has clearance from the surgeon.

## 2022-05-15 ENCOUNTER — Other Ambulatory Visit (HOSPITAL_COMMUNITY): Payer: Self-pay

## 2022-05-16 ENCOUNTER — Other Ambulatory Visit: Payer: Self-pay

## 2022-05-16 ENCOUNTER — Other Ambulatory Visit: Payer: Self-pay | Admitting: Podiatry

## 2022-05-16 ENCOUNTER — Telehealth: Payer: Self-pay | Admitting: Podiatry

## 2022-05-16 DIAGNOSIS — M2041 Other hammer toe(s) (acquired), right foot: Secondary | ICD-10-CM | POA: Diagnosis not present

## 2022-05-16 HISTORY — PX: OTHER SURGICAL HISTORY: SHX169

## 2022-05-16 MED ORDER — TRAMADOL HCL 50 MG PO TABS: 50.0000 mg | ORAL_TABLET | Freq: Four times a day (QID) | ORAL | 0 refills | Status: DC | PRN

## 2022-05-16 MED ORDER — ACETAMINOPHEN 500 MG PO TABS: 1000.0000 mg | ORAL_TABLET | Freq: Four times a day (QID) | ORAL | 0 refills | Status: AC | PRN

## 2022-05-16 NOTE — Telephone Encounter (Signed)
Patient son called and wanted to know to know can his mom go ahead and take tylenol.  I called patient back to inform her that Tylenol was sent to her [place] and she can start medication.

## 2022-05-16 NOTE — Progress Notes (Signed)
05/16/22 R 2nd toe amputation

## 2022-05-22 ENCOUNTER — Ambulatory Visit (INDEPENDENT_AMBULATORY_CARE_PROVIDER_SITE_OTHER): Payer: Medicare PPO | Admitting: Podiatry

## 2022-05-22 ENCOUNTER — Other Ambulatory Visit (HOSPITAL_COMMUNITY): Payer: Self-pay

## 2022-05-22 DIAGNOSIS — M2041 Other hammer toe(s) (acquired), right foot: Secondary | ICD-10-CM | POA: Diagnosis not present

## 2022-05-26 NOTE — Progress Notes (Signed)
  Subjective:  Patient ID: Michele Bryan, female    DOB: 1940-06-12,  MRN: [id number]  Chief Complaint  Patient presents with   Routine Post Op    POV #1 DOS 05/16/2022 2ND RT TOE AMPUTATION      82 y.o. female returns for post-op check.  Doing well she is not having much pain  Review of Systems: Negative except as noted in the HPI. Denies N/V/F/Ch.   Objective:  There were no vitals filed for this visit. There is no height or weight on file to calculate BMI. Constitutional Well developed. Well nourished.  Vascular Foot warm and well perfused. Capillary refill normal to all digits.  Calf is soft and supple, no posterior calf or knee pain, negative Homans' sign  Neurologic Normal speech. Oriented to person, place, and time. Epicritic sensation to light touch grossly present bilaterally.  Dermatologic Skin healing well without signs of infection. Skin edges well coapted without signs of infection.  Orthopedic: Tenderness to palpation noted about the surgical site.    Assessment:   1. Hammertoe of right foot    Plan:  Patient was evaluated and treated and all questions answered.  S/p foot surgery right -Progressing as expected post-operatively. -WB Status: WBAT in postop shoe -Sutures: Plan to remove in 2 weeks and then transition back to regular shoe gear, she may begin bathing next week. -Medications: No refills required -Foot redressed.  Return in about 2 weeks (around 06/05/2022) for post op (no x-rays), suture removal.

## 2022-05-27 ENCOUNTER — Telehealth: Payer: Self-pay

## 2022-05-27 MED ORDER — TRAMADOL HCL 50 MG PO TABS: 50.0000 mg | ORAL_TABLET | Freq: Four times a day (QID) | ORAL | 0 refills | Status: AC | PRN

## 2022-05-28 NOTE — Telephone Encounter (Signed)
Left a detailed voicemail letting patient know pain medication was sent over to her pharmacy.

## 2022-06-05 ENCOUNTER — Ambulatory Visit (INDEPENDENT_AMBULATORY_CARE_PROVIDER_SITE_OTHER): Payer: Medicare PPO | Admitting: Podiatry

## 2022-06-05 VITALS — BP 140/89 | HR 85 | Temp 97.5°F | Resp 18

## 2022-06-05 DIAGNOSIS — L97511 Non-pressure chronic ulcer of other part of right foot limited to breakdown of skin: Secondary | ICD-10-CM

## 2022-06-05 NOTE — Progress Notes (Signed)
Patient presents today for post op visit # 2, patient of Dr. Sherryle Lis.   POV # 2 DOS 06/05/2022    Sutures removed today.  Incisions look good and no signs of infections. Patient states patient denies any pain.   Reviewed icing and elevation. Patient will follow up with Dr. Sherryle Lis  for POV# 3 in 4 weeks.

## 2022-06-10 ENCOUNTER — Other Ambulatory Visit (HOSPITAL_COMMUNITY): Payer: Self-pay

## 2022-06-10 ENCOUNTER — Other Ambulatory Visit: Payer: Self-pay | Admitting: *Deleted

## 2022-06-10 DIAGNOSIS — G609 Hereditary and idiopathic neuropathy, unspecified: Secondary | ICD-10-CM | POA: Diagnosis not present

## 2022-06-10 DIAGNOSIS — Z89429 Acquired absence of other toe(s), unspecified side: Secondary | ICD-10-CM | POA: Diagnosis not present

## 2022-06-10 DIAGNOSIS — G4700 Insomnia, unspecified: Secondary | ICD-10-CM | POA: Diagnosis not present

## 2022-06-10 DIAGNOSIS — G319 Degenerative disease of nervous system, unspecified: Secondary | ICD-10-CM | POA: Diagnosis not present

## 2022-06-10 DIAGNOSIS — Z79899 Other long term (current) drug therapy: Secondary | ICD-10-CM

## 2022-06-10 DIAGNOSIS — D510 Vitamin B12 deficiency anemia due to intrinsic factor deficiency: Secondary | ICD-10-CM | POA: Diagnosis not present

## 2022-06-10 DIAGNOSIS — M0579 Rheumatoid arthritis with rheumatoid factor of multiple sites without organ or systems involvement: Secondary | ICD-10-CM | POA: Diagnosis not present

## 2022-06-10 DIAGNOSIS — D84821 Immunodeficiency due to drugs: Secondary | ICD-10-CM | POA: Diagnosis not present

## 2022-06-10 DIAGNOSIS — E7800 Pure hypercholesterolemia, unspecified: Secondary | ICD-10-CM | POA: Diagnosis not present

## 2022-06-10 DIAGNOSIS — F03A0 Unspecified dementia, mild, without behavioral disturbance, psychotic disturbance, mood disturbance, and anxiety: Secondary | ICD-10-CM | POA: Diagnosis not present

## 2022-06-10 MED ORDER — BARICITINIB 2 MG PO TABS: 2.0000 mg | ORAL_TABLET | Freq: Every day | ORAL | 0 refills | Status: DC

## 2022-06-10 NOTE — Progress Notes (Signed)
CBC stable

## 2022-06-10 NOTE — Telephone Encounter (Signed)
Patient's husband called to request refill of Olumiant on behalf of Ela[facility].  Last Fill: 12/12/2021  Labs: 02/18/2022 Lipid panel WNL. Creatinine is borderline elevated.  GFR is borderline low-57.  Rest of CMP WNL. Anemia is stable  TB Gold: 02/18/2022 Neg    Next Visit: 07/01/2022  Last Visit: 04/01/2022  CV:[id number] arthritis involving multiple sites with positive rheumatoid factor   Current Dose per office note 04/01/2022: Olumiant 2 mg by mouth daily   Patient updated labs in office today.   Okay to refill Olumiant?

## 2022-06-11 ENCOUNTER — Other Ambulatory Visit: Payer: Self-pay | Admitting: Family Medicine

## 2022-06-11 DIAGNOSIS — N949 Unspecified condition associated with female genital organs and menstrual cycle: Secondary | ICD-10-CM

## 2022-06-11 LAB — CBC WITH DIFFERENTIAL/PLATELET
Absolute Monocytes: 588 cells/uL (ref 200–950)
Basophils Absolute: 48 cells/uL (ref 0–200)
Basophils Relative: 0.8 %
Eosinophils Absolute: 150 cells/uL (ref 15–500)
Eosinophils Relative: 2.5 %
HCT: 33.7 % — ABNORMAL LOW (ref 35.0–45.0)
Hemoglobin: 11 g/dL — ABNORMAL LOW (ref 11.7–15.5)
Lymphs Abs: 2022 cells/uL (ref 850–3900)
MCH: 30.8 pg (ref 27.0–33.0)
MCHC: 32.6 g/dL (ref 32.0–36.0)
MCV: 94.4 fL (ref 80.0–100.0)
MPV: 10.1 fL (ref 7.5–12.5)
Monocytes Relative: 9.8 %
Neutro Abs: 3192 cells/uL (ref 1500–7800)
Neutrophils Relative %: 53.2 %
Platelets: 315 10*3/uL (ref 140–400)
RBC: 3.57 10*6/uL — ABNORMAL LOW (ref 3.80–5.10)
RDW: 12.3 % (ref 11.0–15.0)
Total Lymphocyte: 33.7 %
WBC: 6 10*3/uL (ref 3.8–10.8)

## 2022-06-11 LAB — COMPLETE METABOLIC PANEL WITH GFR
AG Ratio: 2 (calc) (ref 1.0–2.5)
ALT: 13 U/L (ref 6–29)
AST: 19 U/L (ref 10–35)
Albumin: 4.6 g/dL (ref 3.6–5.1)
Alkaline phosphatase (APISO): 69 U/L (ref 37–153)
BUN/Creatinine Ratio: 30 (calc) — ABNORMAL HIGH (ref 6–22)
BUN: 27 mg/dL — ABNORMAL HIGH (ref 7–25)
CO2: 25 mmol/L (ref 20–32)
Calcium: 9.9 mg/dL (ref 8.6–10.4)
Chloride: 107 mmol/L (ref 98–110)
Creat: 0.9 mg/dL (ref 0.60–1.00)
Globulin: 2.3 g/dL (calc) (ref 1.9–3.7)
Glucose, Bld: 87 mg/dL (ref 65–99)
Potassium: 4.7 mmol/L (ref 3.5–5.3)
Sodium: 141 mmol/L (ref 135–146)
Total Bilirubin: 0.9 mg/dL (ref 0.2–1.2)
Total Protein: 6.9 g/dL (ref 6.1–8.1)
eGFR: 65 mL/min/{1.73_m2} (ref >=60)

## 2022-06-11 NOTE — Progress Notes (Signed)
CMP stable.  No change in therapy recommended.

## 2022-06-16 ENCOUNTER — Other Ambulatory Visit: Payer: Self-pay | Admitting: Physician Assistant

## 2022-06-16 DIAGNOSIS — M0579 Rheumatoid arthritis with rheumatoid factor of multiple sites without organ or systems involvement: Secondary | ICD-10-CM

## 2022-06-16 NOTE — Telephone Encounter (Signed)
Last Fill: 03/10/2022  Eye exam: 12/16/2021   Labs: 06/10/2022 CBC stable. CMP stable.   Next Visit: 07/01/2022  Last Visit: 04/01/2022  DX: Rheumatoid arthritis involving multiple sites with positive rheumatoid factor   Current Dose per office note 04/01/2022: plaquenil 200 mg 1 tablet by mouth daily   Okay to refill Plaquenil?

## 2022-06-17 NOTE — Progress Notes (Unsigned)
Office Visit Note  Patient: Michele Bryan             Date of Birth: 07/10/1940           MRN: [id number]             PCP: Sigmund Hazel, MD Referring: Sigmund Hazel, MD Visit Date: 07/01/2022 Occupation: @GUAROCC @  Subjective:  Discuss medication options for osteoporosis management  History of Present Illness: Michele Bryan is a 82 y.o. female with history of seropositive rheumatoid arthritis and osteoarthritis.  She is taking Olumiant 2 mg by mouth daily and plaquenil 200 mg 1 tablet by mouth daily.  She is tolerating combination therapy without any side effects.  Overall her rheumatoid arthritis remains stable on the current treatment regimen.  She continues to have chronic pain and stiffness in her neck and left shoulder replacement.  She has ongoing pain and stiffness in her hands and feet due to underlying rheumatoid arthritis and osteoarthritis overlap. She denies any recent infections. Patient states that she has completed the course of Forteo.  She initially started on Forteo in February 2022.  She is not due for an updated bone density until May 2025.  Patient is curious if she will need to start on a different medication since she has completed Forteo.      Activities of Daily Living:  Patient reports morning stiffness for all day. Patient Reports nocturnal pain.  Difficulty dressing/grooming: Denies Difficulty climbing stairs: Reports Difficulty getting out of chair: Reports Difficulty using hands for taps, buttons, cutlery, and/or writing: Reports  Review of Systems  Constitutional:  Positive for fatigue.  HENT:  Negative for mouth sores and mouth dryness.   Eyes:  Positive for photophobia. Negative for dryness.  Respiratory:  Negative for shortness of breath.   Cardiovascular:  Negative for chest pain and palpitations.  Gastrointestinal:  Positive for constipation and diarrhea. Negative for blood in stool.  Endocrine: Positive for increased urination.   Genitourinary:  Positive for involuntary urination.  Musculoskeletal:  Positive for joint pain, joint pain, joint swelling, myalgias, muscle weakness, morning stiffness, muscle tenderness and myalgias. Negative for gait problem.  Skin:  Negative for color change, rash, hair loss and sensitivity to sunlight.  Allergic/Immunologic: Negative for susceptible to infections.  Neurological:  Negative for dizziness and headaches.  Hematological:  Negative for swollen glands.  Psychiatric/Behavioral:  Positive for sleep disturbance. Negative for depressed mood. The patient is not nervous/anxious.     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   Gait abnormality 03/08/2020   Postoperative pain    Benign essential HTN    Constipation    Anemia of chronic disease    Essential hypertension    Orthostatic hypotension    Cervical myelopathy (HCC) 02/21/2020   Excoriation of ear canal, left, initial encounter 01/17/2020   Impacted cerumen of left ear 01/17/2020   Closed cervical spine fracture (HCC) 12/06/2019   Scapula fracture 03/05/2018   Fracture of acromial process of scapula, closed 02/15/2018   Chronic left shoulder pain 11/25/2017   Left cervical radiculopathy 09/29/2017   S/P shoulder replacement, left 05/15/2017   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  05/06/2016   High risk medication use 05/06/2016   Primary osteoarthritis of both hands 05/06/2016   Primary osteoarthritis of left knee 05/06/2016   Primary osteoarthritis of both feet  05/06/2016   Dyslipidemia 05/06/2016   Peripheral neuropathy 05/06/2016   Diverticulosis of intestine without bleeding 05/06/2016   Osteopenia  05/06/2016   Vitamin D deficiency 05/06/2016   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   History of GI bleed 05/06/2016   Cataract of  both eyes 05/06/2016   Degenerative joint disease of hand 05/06/2016   Osteoarthritis of foot joint 05/06/2016   Urinary urgency 04/30/2015   Chronic constipation 04/30/2015   MS (multiple sclerosis) (HCC) 03/23/2013   Expected blood loss anemia 06/16/2012   S/P right TKA 06/15/2012    Past Medical History:  Diagnosis Date   Anemia    Arthritis    GERD (gastroesophageal reflux disease)    History of blood transfusion    Hyperlipidemia    Hypertension    Lower extremity edema    MS (multiple sclerosis) (HCC)    Neuromuscular disorder (HCC)    multiple scleroosis/peripheral neuropathy   PONV (postoperative nausea and vomiting)    Rheumatoid arthritis (HCC)    Ulcer    Vitamin D deficiency     Family History  Problem Relation Age of Onset   Heart attack Father    Breast cancer Neg Hx    Past Surgical History:  Procedure Laterality Date   APPLICATION OF INTRAOPERATIVE CT SCAN N/A 02/16/2020   Procedure: APPLICATION OF INTRAOPERATIVE CT SCAN;  Surgeon: Jadene Pierini, MD;  Location: [facility] OR;  Service: Neurosurgery;  Laterality: N/A;   AUGMENTATION MAMMAPLASTY     BACK SURGERY     breast augumentation     BREAST SURGERY     biopsy   COLONOSCOPY     DILATION AND CURETTAGE OF UTERUS     EYE SURGERY     both eys,cataracts   goiter     HALO APPLICATION N/A 02/16/2020   Procedure: HALO TRACTION APPLICATION;  Surgeon: Jadene Pierini, MD;  Location: [facility] OR;  Service: Neurosurgery;  Laterality: N/A;   JOINT REPLACEMENT     KNEE ARTHROSCOPY Right    ORIF SHOULDER FRACTURE Left 03/05/2018   Procedure: OPEN REDUCTION INTERNAL FIXATION (ORIF) LEFT SCAPULA;  Surgeon: Beverely Low, MD;  Location: [facility] OR;  Service: Orthopedics;  Laterality: Left;   partial toe amputation Right 05/16/2022   2nd toe   POSTERIOR CERVICAL FUSION/FORAMINOTOMY N/A 12/06/2019   Procedure: Cervical one-two Posterior instrumented fusion;  Surgeon: Jadene Pierini, MD;  Location: [facility] OR;  Service:  Neurosurgery;  Laterality: N/A;   POSTERIOR CERVICAL FUSION/FORAMINOTOMY N/A 02/13/2020   Procedure: Revision of cervical instrumented fusion with Occiput to Cervical Four posterior instrumented fusion;  Surgeon: Jadene Pierini, MD;  Location: [facility] OR;  Service: Neurosurgery;  Laterality: N/A;  posterior   POSTERIOR CERVICAL FUSION/FORAMINOTOMY N/A 02/16/2020   Procedure: Occiput to Thoracic Two Posterior Cervical Fusion with AIRO and Application of Halo;  Surgeon: Jadene Pierini, MD;  Location: [facility] OR;  Service: Neurosurgery;  Laterality: N/A;   REVERSE SHOULDER ARTHROPLASTY Left 05/15/2017   Procedure: LEFT REVERSE SHOULDER ARTHROPLASTY;  Surgeon: Beverely Low, MD;  Location: [facility] OR;  Service: Orthopedics;  Laterality: Left;   REVERSE SHOULDER ARTHROPLASTY Left 10/11/2018   Procedure: left shoulder irrigation and debridement, open poly exchange and removal of painful hardware;  Surgeon: Beverely Low, MD;  Location: [facility] OR[facility];  Service: Orthopedics;  Laterality: Left;   SHOULDER HEMI-ARTHROPLASTY Left 12/11/2020   Procedure: Left reverse shoulder conversion to SHOULDER HEMI-ARTHROPLASTY;  Surgeon: Beverely Low, MD;  Location: [facility];  Service: Orthopedics;  Laterality: Left;  with ISB   THYROID LOBECTOMY     TOTAL KNEE ARTHROPLASTY Right 06/15/2012   Procedure: RIGHT TOTAL KNEE ARTHROPLASTY;  Surgeon: [name], MD;  Location: [facility];  Service: Orthopedics;  Laterality: Right;   WRIST SURGERY Left    Social History   Social History Narrative   Patient lives at home with her husband [name]).   Retired.   Education- college.   Caffeine- Two cups of decaf tea daily.   Right handed.         Immunization History  Administered Date(s) Administered   Influenza, High Dose Seasonal PF 12/03/2013   Influenza,inj,quad, With Preservative 11/23/2014   Influenza-Unspecified 12/19/2017   PFIZER(Purple Top)SARS-COV-2 Vaccination 04/16/2019, 05/29/2019, 01/09/2020   Td 12/15/2017   Td  (Adult),5 Lf Tetanus Toxid, Preservative Free 12/15/2017   Zoster Recombinat (Shingrix) 08/10/2017, 01/05/2018   Zoster, Unspecified 08/10/2017, 01/05/2018     Objective: Vital Signs: BP 122/70 (BP Location: Left Arm, Patient Position: Sitting, Cuff Size: Normal)   Pulse 70   Resp 12   Ht 5\' 3"  (1.6 m)   Wt 114 lb 3.2 oz (51.8 kg)   BMI 20.23 kg/m    Physical Exam Vitals and nursing note reviewed.  Constitutional:      Appearance: She is well-developed.  HENT:     Head: Normocephalic and atraumatic.  Eyes:     Conjunctiva/sclera: Conjunctivae normal.  Cardiovascular:     Rate and Rhythm: Normal rate and regular rhythm.     Heart sounds: Normal heart sounds.  Pulmonary:     Effort: Pulmonary effort is normal.     Breath sounds: Normal breath sounds.  Abdominal:     General: Bowel sounds are normal.     Palpations: Abdomen is soft.  Musculoskeletal:     Cervical back: Normal range of motion.  Lymphadenopathy:     Cervical: No cervical adenopathy.  Skin:    General: Skin is warm and dry.     Capillary Refill: Capillary refill takes less than 2 seconds.  Neurological:     Mental Status: She is alert and oriented to person, place, and time.  Psychiatric:        Behavior: Behavior normal.      Musculoskeletal Exam: C-spine has severely limited range of motion.  Thoracic kyphosis.  Right shoulder has good range of motion with no discomfort at this time.  Left shoulder replacement has limited and painful range of motion.  Elbow joints have good range of motion with no joint tenderness.  Severe CMC thickening bilaterally.  Mild inflammation noted in the right second and third PIP joints.  No tenderness or synovitis over MCP joints.  Hip joints difficult to assess in seated position.  Right knee replacement has good range of motion.  Left knee joint has good range of motion with no warmth or effusion.  Ankle joints have good range of motion with no joint tenderness today.  CDAI  Exam: CDAI Score: -- Patient Global: --; Provider Global: -- Swollen: --; Tender: -- Joint Exam 07/01/2022   No joint exam has been documented for this visit   There is currently no information documented on the homunculus. Go to the Rheumatology activity and complete the homunculus joint exam.  Investigation: No additional findings.  Imaging: No results found.  Recent Labs: Lab Results  Component Value Date   WBC 6.0 06/10/2022   HGB 11.0 (L) 06/10/2022   PLT 315 06/10/2022   NA 141 06/10/2022   K 4.7 06/10/2022  CL 107 06/10/2022   CO2 25 06/10/2022   GLUCOSE 87 06/10/2022   BUN 27 (H) 06/10/2022   CREATININE 0.90 06/10/2022   BILITOT 0.9 06/10/2022   ALKPHOS 62 10/23/2021   AST 19 06/10/2022   ALT 13 06/10/2022   PROT 6.9 06/10/2022   ALBUMIN 4.6 10/23/2021   CALCIUM 9.9 06/10/2022   GFRAA 98 04/03/2020   QFTBGOLDPLUS NEGATIVE 02/18/2022    Speciality Comments: PLQ Eye Exam:12/16/2021 @ [facility] Follow up 1 year Osteoporosis managed by PCP-ACY 10/12/2018 Rinvoq-diarrhea, Xeljanz-12/19/20  Procedures:  No procedures performed Allergies: Meperidine hcl, Codeine, and Demerol [meperidine]      Assessment / Plan:     Visit Diagnoses: Rheumatoid arthritis involving multiple sites with positive rheumatoid factor (HCC): Overall her rheumatoid arthritis remains stable on the current treatment regimen: Olumiant 2 mg by mouth daily and Plaquenil 200 mg 1 tablet by mouth daily.  She is tolerating combination therapy without any side effects and has not missed any doses recently.  She continues to have chronic pain involving multiple joints especially in her hands and feet but overall her symptoms have been stable.  No medication changes will be made at this time.  She was advised to notify us if she starts to have more frequent flares.  She will follow-up in the office in 3 months or sooner if needed.  High risk medication use - Olumiant 2 mg by mouth daily and  plaquenil 200 mg 1 tablet by mouth daily.  CBC and CMP updated 06/10/22.  Her next lab work will be due in July and every 3 months.  TB gold negative on 02/18/22.   No recent or recurrent infections. Discussed the importance of holding olumiant if she develops signs or symptoms of an infection and to resume once the infection has completely cleared.   PLQ Eye Exam:12/16/2021 @ [facility] Follow up 1 year   - Plan: BASIC METABOLIC PANEL WITH GFR  H/O total shoulder replacement, left - Revision x2 performed by Dr. Devonne Doughty.  Chronic pain.  Status post right knee replacement: Doing well.  Good range of motion with no warmth or effusion.  Primary osteoarthritis of both hands: Severe CMC, PIP, DIP thickening consistent with osteoarthritis noted.  Tenderness and inflammation noted in the right second and third PIP joints.  Primary osteoarthritis of left knee: Good range of motion of the left knee joint on examination today.  No warmth or effusion noted.  Primary osteoarthritis of both feet: Chronic pain.  Closed fracture of cervical vertebra, unspecified cervical vertebral level, sequela - Fall in January 2020-she fractured C1 and C2 with displacement requiring fusion on 12/06/2019.  Osteopenia of multiple sites - 07/10/2021 DEXA [place] imaging showed BMD 0.728, T score -2.2 in the right femoral neck. Previously on fosamax by PCP--Developed vertebral fracture.  Switched to [facility] on 04/03/20.   She is taking vitamin D 2000 units daily.  Vitamin D level will be rechecked today.   Different treatment options were discussed today to transition the patient to to preserve the benefits of Forteo.  Discussed the option of Prolia versus Reclast infusions.  Indications, contraindications, and potential side effects of Reclast were discussed today in detail.  Plan to apply for Reclast through her insurance and once approved she will be set up for the first infusion.  BMP and vitamin D were updated  today prior to scheduling the Reclast infusion.  Patient will be due to updated bone density in May 2025.  Vitamin D deficiency-Vitamin D  level be checked today.  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   Other medical conditions are listed as follows:   History of diverticulosis  History of GI bleed  History of multiple sclerosis (HCC)  History of peripheral neuropathy  History of cataract  Dyslipidemia    Orders: Orders Placed This Encounter  Procedures   VITAMIN D 25 Hydroxy (Vit-D Deficiency, Fractures)   BASIC METABOLIC PANEL WITH GFR   No orders of the defined types were placed in this encounter.     Follow-Up Instructions: Return in about 3 months (around 09/30/2022) for Rheumatoid arthritis, Osteoarthritis.   Gearldine Bienenstock, PA-C  Note - This record has been created using Dragon software.  Chart creation errors have been sought, but may not always  have been located. Such creation errors do not reflect on  the standard of medical care.

## 2022-06-19 ENCOUNTER — Encounter: Payer: Medicare PPO | Admitting: Podiatry

## 2022-06-24 ENCOUNTER — Other Ambulatory Visit: Payer: Self-pay | Admitting: Podiatry

## 2022-06-24 DIAGNOSIS — N390 Urinary tract infection, site not specified: Secondary | ICD-10-CM | POA: Diagnosis not present

## 2022-06-24 DIAGNOSIS — L03012 Cellulitis of left finger: Secondary | ICD-10-CM | POA: Diagnosis not present

## 2022-06-27 DIAGNOSIS — S129XXA Fracture of neck, unspecified, initial encounter: Secondary | ICD-10-CM | POA: Diagnosis not present

## 2022-07-01 ENCOUNTER — Other Ambulatory Visit: Payer: Self-pay

## 2022-07-01 ENCOUNTER — Other Ambulatory Visit: Payer: Self-pay | Admitting: Rheumatology

## 2022-07-01 ENCOUNTER — Ambulatory Visit: Payer: Medicare PPO | Attending: Physician Assistant | Admitting: Physician Assistant

## 2022-07-01 ENCOUNTER — Encounter: Payer: Self-pay | Admitting: Physician Assistant

## 2022-07-01 ENCOUNTER — Other Ambulatory Visit (HOSPITAL_COMMUNITY): Payer: Self-pay

## 2022-07-01 ENCOUNTER — Ambulatory Visit: Payer: Medicare PPO | Admitting: Physician Assistant

## 2022-07-01 VITALS — BP 122/70 | HR 70 | Resp 12 | Ht 63.0 in | Wt 114.2 lb

## 2022-07-01 DIAGNOSIS — M19071 Primary osteoarthritis, right ankle and foot: Secondary | ICD-10-CM

## 2022-07-01 DIAGNOSIS — Z8719 Personal history of other diseases of the digestive system: Secondary | ICD-10-CM

## 2022-07-01 DIAGNOSIS — M1712 Unilateral primary osteoarthritis, left knee: Secondary | ICD-10-CM

## 2022-07-01 DIAGNOSIS — M0579 Rheumatoid arthritis with rheumatoid factor of multiple sites without organ or systems involvement: Secondary | ICD-10-CM | POA: Diagnosis not present

## 2022-07-01 DIAGNOSIS — E785 Hyperlipidemia, unspecified: Secondary | ICD-10-CM

## 2022-07-01 DIAGNOSIS — M4004 Postural kyphosis, thoracic region: Secondary | ICD-10-CM

## 2022-07-01 DIAGNOSIS — Z79899 Other long term (current) drug therapy: Secondary | ICD-10-CM | POA: Diagnosis not present

## 2022-07-01 DIAGNOSIS — S129XXS Fracture of neck, unspecified, sequela: Secondary | ICD-10-CM

## 2022-07-01 DIAGNOSIS — M8589 Other specified disorders of bone density and structure, multiple sites: Secondary | ICD-10-CM | POA: Diagnosis not present

## 2022-07-01 DIAGNOSIS — E559 Vitamin D deficiency, unspecified: Secondary | ICD-10-CM | POA: Diagnosis not present

## 2022-07-01 DIAGNOSIS — M19072 Primary osteoarthritis, left ankle and foot: Secondary | ICD-10-CM

## 2022-07-01 DIAGNOSIS — M19041 Primary osteoarthritis, right hand: Secondary | ICD-10-CM

## 2022-07-01 DIAGNOSIS — Z96612 Presence of left artificial shoulder joint: Secondary | ICD-10-CM

## 2022-07-01 DIAGNOSIS — Z96651 Presence of right artificial knee joint: Secondary | ICD-10-CM | POA: Diagnosis not present

## 2022-07-01 DIAGNOSIS — Z8669 Personal history of other diseases of the nervous system and sense organs: Secondary | ICD-10-CM

## 2022-07-01 DIAGNOSIS — Z8639 Personal history of other endocrine, nutritional and metabolic disease: Secondary | ICD-10-CM

## 2022-07-01 DIAGNOSIS — M19042 Primary osteoarthritis, left hand: Secondary | ICD-10-CM

## 2022-07-01 DIAGNOSIS — G35 Multiple sclerosis: Secondary | ICD-10-CM

## 2022-07-01 MED ORDER — TERIPARATIDE 600 MCG/2.4ML ~~LOC~~ SOPN
PEN_INJECTOR | SUBCUTANEOUS | 2 refills | Status: DC
  Filled 2022-07-01: qty 2.4, 28d supply, fill #0

## 2022-07-01 NOTE — Patient Instructions (Addendum)
Standing Labs We placed an order today for your standing lab work.   Please have your standing labs drawn in July and every 3 months   Please have your labs drawn 2 weeks prior to your appointment so that the provider can discuss your lab results at your appointment, if possible.  Please note that you may see your imaging and lab results in MyChart before we have reviewed them. We will contact you once all results are reviewed. Please allow our office up to 72 hours to thoroughly review all of the results before contacting the office for clarification of your results.  WALK-IN LAB HOURS  Monday through Thursday from 8:00 am -12:30 pm and 1:00 pm-5:00 pm and Friday from 8:00 am-12:00 pm.  Patients with office visits requiring labs will be seen before walk-in labs.  You may encounter longer than normal wait times. Please allow additional time. Wait times may be shorter on  Monday and Thursday afternoons.  We do not book appointments for walk-in labs. We appreciate your patience and understanding with our staff.   Labs are drawn by [facility]. Please bring your co-pay at the time of your lab draw.  You may receive a bill from [facility] for your lab work.  Please note if you are on Hydroxychloroquine and and an order has been placed for a Hydroxychloroquine level,  you will need to have it drawn 4 hours or more after your last dose.  If you wish to have your labs drawn at another location, please call the office 24 hours in advance so we can fax the orders.  The office is located at [address], [address], [place], [place] [address]   If you have any questions regarding directions or hours of operation,  please call [phone].   As a reminder, please drink plenty of water prior to coming for your lab work. Thanks!   Zoledronic Acid Injection (Bone Disorders) What is this medication? ZOLEDRONIC ACID (ZOE le dron ik AS id) prevents and treats osteoporosis. It may also be used to treat  Paget's disease of the bone. It works by Interior and spatial designer stronger and less likely to break (fracture). It belongs to a group of medications called bisphosphonates. This medicine may be used for other purposes; ask your health care provider or pharmacist if you have questions. COMMON BRAND NAME(S): Reclast What should I tell my care team before I take this medication? They need to know if you have any of these conditions: Bleeding disorder Cancer Dental disease Kidney disease Low levels of calcium in the blood Low red blood cell counts Lung or breathing disease, such as asthma Receiving steroids, such as dexamethasone or prednisone An unusual or allergic reaction to zoledronic acid, other medications, foods, dyes, or preservatives Pregnant or trying to get pregnant Breast-feeding How should I use this medication? This medication is injected into a vein. It is given by your care team in a hospital or clinic setting. A special MedGuide will be given to you before each treatment. Be sure to read this information carefully each time. Talk to your care team about the use of this medication in children. Special care may be needed. Overdosage: If you think you have taken too much of this medicine contact a poison control center or emergency room at once. NOTE: This medicine is only for you. Do not share this medicine with others. What if I miss a dose? Keep appointments for follow-up doses. It is important not to miss your dose.  Call your care team if you are unable to keep an appointment. What may interact with this medication? Certain antibiotics given by injection Medications for pain and inflammation, such as ibuprofen, naproxen, NSAIDs Some diuretics, such as bumetanide, furosemide Teriparatide This list may not describe all possible interactions. Give your health care provider a list of all the medicines, herbs, non-prescription drugs, or dietary supplements you use. Also tell them if  you smoke, drink alcohol, or use illegal drugs. Some items may interact with your medicine. What should I watch for while using this medication? Visit your care team for regular checks on your progress. It may be some time before you see the benefit from this medication. Some people who take this medication have severe bone, joint, or muscle pain. This medication may also increase your risk for jaw problems or a broken thigh bone. Tell your care team right away if you have severe pain in your jaw, bones, joints, or muscles. Tell your care team if you have any pain that does not go away or that gets worse. You should make sure you get enough calcium and vitamin D while you are taking this medication. Discuss the foods you eat and the vitamins you take with your care team. You may need bloodwork while taking this medication. Tell your dentist and dental surgeon that you are taking this medication. You should not have major dental surgery while on this medication. See your dentist to have a dental exam and fix any dental problems before starting this medication. Take good care of your teeth while on this medication. Make sure you see your dentist for regular follow-up appointments. What side effects may I notice from receiving this medication? Side effects that you should report to your care team as soon as possible: Allergic reactions--skin rash, itching, hives, swelling of the face, lips, tongue, or throat Kidney injury--decrease in the amount of urine, swelling of the ankles, hands, or feet Low calcium level--muscle pain or cramps, confusion, tingling, or numbness in the hands or feet Osteonecrosis of the jaw--pain, swelling, or redness in the mouth, numbness of the jaw, poor healing after dental work, unusual discharge from the mouth, visible bones in the mouth Severe bone, joint, or muscle pain Side effects that usually do not require medical attention (report to your care team if they continue or  are bothersome): Diarrhea Dizziness Headache Nausea Stomach pain Vomiting This list may not describe all possible side effects. Call your doctor for medical advice about side effects. You may report side effects to FDA at 1-800-FDA-1088. Where should I keep my medication? This medication is given in a hospital or clinic. It will not be stored at home. NOTE: This sheet is a summary. It may not cover all possible information. If you have questions about this medicine, talk to your doctor, pharmacist, or health care provider.  2023 Elsevier/Gold Standard (2021-04-05 00:00:00)

## 2022-07-01 NOTE — Telephone Encounter (Signed)
Last Fill: 02/06/2022  Labs: 06/10/2022  CBC stable. CMP stable. No change in therapy recommended.  Next Visit: 07/01/2022  Last Visit: 04/01/2022  DX: Osteopenia of multiple sites   Current Dose per office note 04/01/2022: Forteo daily injections   Okay to refill Forteo?

## 2022-07-02 ENCOUNTER — Ambulatory Visit
Admission: RE | Admit: 2022-07-02 | Discharge: 2022-07-02 | Disposition: A | Discharge status: Home / Self Care | Payer: Medicare PPO | Source: Ambulatory Visit | Attending: Family Medicine | Admitting: Family Medicine

## 2022-07-02 DIAGNOSIS — D259 Leiomyoma of uterus, unspecified: Secondary | ICD-10-CM | POA: Diagnosis not present

## 2022-07-02 DIAGNOSIS — N949 Unspecified condition associated with female genital organs and menstrual cycle: Secondary | ICD-10-CM

## 2022-07-02 LAB — BASIC METABOLIC PANEL WITH GFR
BUN/Creatinine Ratio: 30 (calc) — ABNORMAL HIGH (ref 6–22)
BUN: 46 mg/dL — ABNORMAL HIGH (ref 7–25)
CO2: 24 mmol/L (ref 20–32)
Calcium: 10.4 mg/dL (ref 8.6–10.4)
Chloride: 106 mmol/L (ref 98–110)
Creat: 1.53 mg/dL — ABNORMAL HIGH (ref 0.60–1.00)
Glucose, Bld: 103 mg/dL — ABNORMAL HIGH (ref 65–99)
Potassium: 5.2 mmol/L (ref 3.5–5.3)
Sodium: 136 mmol/L (ref 135–146)
eGFR: 34 mL/min/{1.73_m2} — ABNORMAL LOW (ref >=60)

## 2022-07-02 LAB — VITAMIN D 25 HYDROXY (VIT D DEFICIENCY, FRACTURES): Vit D, 25-Hydroxy: 24 ng/mL — ABNORMAL LOW (ref 30–100)

## 2022-07-02 NOTE — Progress Notes (Signed)
Vitamin D is low-24.  Please send in vitamin D 50,00 units once weekly x79months. Recheck vitamin D in 3 months.   Creatinine is elevated-1.53 and GFR is low-34.   Renal function was WNL 3 weeks ago.  Please clarify what has changed? Any new medications? NSAID use? Recent hospitalization?

## 2022-07-03 ENCOUNTER — Telehealth: Payer: Self-pay | Admitting: Pharmacist

## 2022-07-03 ENCOUNTER — Other Ambulatory Visit: Payer: Self-pay | Admitting: *Deleted

## 2022-07-03 ENCOUNTER — Ambulatory Visit (INDEPENDENT_AMBULATORY_CARE_PROVIDER_SITE_OTHER): Payer: Medicare PPO | Admitting: Podiatry

## 2022-07-03 DIAGNOSIS — M2041 Other hammer toe(s) (acquired), right foot: Secondary | ICD-10-CM

## 2022-07-03 DIAGNOSIS — E559 Vitamin D deficiency, unspecified: Secondary | ICD-10-CM

## 2022-07-03 MED ORDER — VITAMIN D (ERGOCALCIFEROL) 1.25 MG (50000 UNIT) PO CAPS: 50000.0000 [IU] | ORAL_CAPSULE | ORAL | 0 refills | Status: DC

## 2022-07-03 NOTE — Telephone Encounter (Addendum)
Please start Prolia SQ BIV through pharmacy and medical benefit.  Chesley Mires, PharmD, MPH, BCPS, CPP Clinical Pharmacist (Rheumatology and Pulmonology)  ----- Message from Ellen Henri, CMA sent at 07/01/2022  3:08 PM EDT ----- Please apply for Reclast IV per Sherron Ales, PA-C. Thanks!

## 2022-07-03 NOTE — Progress Notes (Signed)
  Subjective:  Patient ID: Michele Bryan, female    DOB: September 28, 1940,  MRN: [id number]  Chief Complaint  Patient presents with   Routine Post Op    POV #3 DOS 05/16/2022 2ND RT TOE AMPUTATION      82 y.o. female returns for post-op check.  Doing well she is not having any pain at all pleased with progress  Review of Systems: Negative except as noted in the HPI. Denies N/V/F/Ch.   Objective:  There were no vitals filed for this visit. There is no height or weight on file to calculate BMI. Constitutional Well developed. Well nourished.  Vascular Foot warm and well perfused. Capillary refill normal to all digits.  Calf is soft and supple, no posterior calf or knee pain, negative Homans' sign  Neurologic Normal speech. Oriented to person, place, and time. Epicritic sensation to light touch grossly present bilaterally.  Dermatologic Incision well-healed not hypertrophic  Orthopedic: She has minimal edema and no tenderness to palpation noted about the surgical site.    Assessment:   1. Hammertoe of right foot    Plan:  Patient was evaluated and treated and all questions answered.  S/p foot surgery right -Overall doing very well may continue regular bathing shoe and activity.  Return to me as needed if further issues.  Small amount of suture was present today and this was removed uneventfully no further suture material remaining.  Return if symptoms worsen or fail to improve.

## 2022-07-04 ENCOUNTER — Other Ambulatory Visit (HOSPITAL_COMMUNITY): Payer: Self-pay

## 2022-07-08 ENCOUNTER — Other Ambulatory Visit (HOSPITAL_COMMUNITY): Payer: Self-pay

## 2022-07-08 NOTE — Telephone Encounter (Signed)
Test claim revealed that insurance covers [phone], leaving patient with a copay of $50.00. There does not appear to be any prior authorization required at this time.  Pt does not have an additional supplemental Medicare plan, so it is likely that she will be responsible for paying the standard 20% coinsurance if billed through medical benefits. Pt was previously comfortable paying $100 copay for monthly Forteo injections through pharmacy benefits.

## 2022-07-14 ENCOUNTER — Encounter: Payer: Self-pay | Admitting: Pharmacist

## 2022-07-14 ENCOUNTER — Telehealth: Payer: Self-pay | Admitting: Pharmacist

## 2022-07-14 DIAGNOSIS — M810 Age-related osteoporosis without current pathological fracture: Secondary | ICD-10-CM | POA: Insufficient documentation

## 2022-07-14 DIAGNOSIS — Z79899 Other long term (current) drug therapy: Secondary | ICD-10-CM

## 2022-07-14 NOTE — Telephone Encounter (Deleted)
Patient will be new start to Reclast however her most recent creatinine clearance would not allow her to receive Reclast.   Spoke with patient about repeating CMET. Patient will coordinate labwork with her husband when he is back in town.   Venida Tsukamoto, PharmD, MPH, BCPS, CPP Clinical Pharmacist (Rheumatology and Pulmonology) 

## 2022-07-14 NOTE — Telephone Encounter (Signed)
Patient will be new start to Reclast however her most recent creatinine clearance would not allow her to receive Reclast.   Spoke with patient about repeating CMET. Patient will coordinate labwork with her husband when he is back in town.   Chesley Mires, PharmD, MPH, BCPS, CPP Clinical Pharmacist (Rheumatology and Pulmonology)

## 2022-07-14 NOTE — Telephone Encounter (Signed)
Error

## 2022-07-17 NOTE — Telephone Encounter (Signed)
Order for BMP placed today.  Chesley Mires, PharmD, MPH, BCPS, CPP Clinical Pharmacist (Rheumatology and Pulmonology)

## 2022-07-24 ENCOUNTER — Other Ambulatory Visit: Payer: Self-pay | Admitting: Podiatry

## 2022-07-24 NOTE — Telephone Encounter (Signed)
Left VM with patient to remind of CMET repeat that is needed before moving forward with Reclast  Chesley Mires, PharmD, MPH, BCPS, CPP Clinical Pharmacist (Rheumatology and Pulmonology)

## 2022-07-29 ENCOUNTER — Telehealth: Payer: Self-pay

## 2022-07-29 NOTE — Telephone Encounter (Addendum)
Patient contacted the office to inquire where to get her labs done. Advised the patient she can get them done here in the office or at another office if she would like. Patient states she will come to the office to get labs done.

## 2022-08-04 ENCOUNTER — Other Ambulatory Visit: Payer: Self-pay

## 2022-08-04 DIAGNOSIS — Z79899 Other long term (current) drug therapy: Secondary | ICD-10-CM

## 2022-08-05 ENCOUNTER — Other Ambulatory Visit: Payer: Self-pay | Admitting: Pharmacist

## 2022-08-05 ENCOUNTER — Telehealth: Payer: Self-pay | Admitting: Pharmacy Technician

## 2022-08-05 LAB — BASIC METABOLIC PANEL
BUN: 24 mg/dL (ref 7–25)
CO2: 22 mmol/L (ref 20–32)
Calcium: 10 mg/dL (ref 8.6–10.4)
Chloride: 109 mmol/L (ref 98–110)
Creat: 0.78 mg/dL (ref 0.60–1.00)
Glucose, Bld: 78 mg/dL (ref 65–99)
Potassium: 4.5 mmol/L (ref 3.5–5.3)
Sodium: 141 mmol/L (ref 135–146)

## 2022-08-05 NOTE — Telephone Encounter (Signed)
BMP wnl. Creatinine has normalized. Can proceed with Reclast infusion. Order placed w [facility]  Chesley Mires, PharmD, MPH, BCPS, CPP Clinical Pharmacist (Rheumatology and Pulmonology)

## 2022-08-05 NOTE — Progress Notes (Signed)
Patient is newly starting Reclast IV  and due for updated orders. Referral placed to [facility] Diagnosis: age-related osteoporosis  Dose: 5 mg IV every 12 months  Last Clinic Visit: 07/01/22 Next Clinic Visit: 09/30/22  Labs: BMET on 08/04/22 wnl  Orders placed for Reclast IV x 1 dose along with premedication of acetaminophen and diphenhydramine to be administered 30 minutes before medication infusion.  Will follow-up to ensured scheduled and completed  Chesley Mires, PharmD, MPH, BCPS, CPP Clinical Pharmacist (Rheumatology and Pulmonology)

## 2022-08-05 NOTE — Telephone Encounter (Signed)
Auth Submission: NO AUTH NEEDED Site of care: Site of care: [facility] Payer: [employer] MEDICARE Medication & CPT/J Code(s) submitted: Reclast (Zolendronic acid) [id number] Route of submission (phone, fax, portal):  Phone # Fax # Auth type: Buy/Bill Units/visits requested: X1 DOSE Reference number:  Approval from: 08/05/22 to 03/03/23

## 2022-08-05 NOTE — Progress Notes (Signed)
BMP WNL

## 2022-08-08 NOTE — Progress Notes (Signed)
Reclast scheduled for 08/15/22. Will f/u to ensure completed  Chesley Mires, PharmD, MPH, BCPS, CPP Clinical Pharmacist (Rheumatology and Pulmonology)

## 2022-08-15 ENCOUNTER — Ambulatory Visit (INDEPENDENT_AMBULATORY_CARE_PROVIDER_SITE_OTHER): Payer: Medicare PPO

## 2022-08-15 VITALS — BP 120/67 | HR 60 | Temp 97.3°F | Resp 18 | Ht 64.0 in | Wt 112.6 lb

## 2022-08-15 DIAGNOSIS — M810 Age-related osteoporosis without current pathological fracture: Secondary | ICD-10-CM

## 2022-08-15 DIAGNOSIS — S129XXS Fracture of neck, unspecified, sequela: Secondary | ICD-10-CM

## 2022-08-15 DIAGNOSIS — M8008XA Age-related osteoporosis with current pathological fracture, vertebra(e), initial encounter for fracture: Secondary | ICD-10-CM | POA: Diagnosis not present

## 2022-08-15 MED ORDER — ACETAMINOPHEN 325 MG PO TABS: 650.0000 mg | ORAL_TABLET | Freq: Once | ORAL | Status: DC

## 2022-08-15 MED ORDER — DIPHENHYDRAMINE HCL 25 MG PO CAPS
25.0000 mg | ORAL_CAPSULE | Freq: Once | ORAL | Status: AC
  Administered 2022-08-15: 25 mg via ORAL
  Filled 2022-08-15: qty 1

## 2022-08-15 MED ORDER — ZOLEDRONIC ACID 5 MG/100ML IV SOLN
5.0000 mg | Freq: Once | INTRAVENOUS | Status: AC
  Administered 2022-08-15: 5 mg via INTRAVENOUS
  Filled 2022-08-15: qty 100

## 2022-08-15 MED ORDER — SODIUM CHLORIDE 0.9 % IV SOLN: INTRAVENOUS | Status: DC

## 2022-08-15 NOTE — Patient Instructions (Signed)

## 2022-08-15 NOTE — Progress Notes (Signed)
Diagnosis: Osteoporosis  Provider:  Chilton Greathouse MD  Procedure: IV Infusion  IV Type: Peripheral, IV Location: R Antecubital  Reclast (Zolendronic Acid), Dose: 5 mg  Infusion Start Time: 1133  Infusion Stop Time: 1202  Post Infusion IV Care: Observation period completed and Peripheral IV Discontinued  Discharge: Condition: Good, Destination: Home . AVS Provided  Performed by:  Adriana Mccallum, RN

## 2022-08-18 DIAGNOSIS — G35 Multiple sclerosis: Secondary | ICD-10-CM | POA: Diagnosis not present

## 2022-08-18 DIAGNOSIS — I10 Essential (primary) hypertension: Secondary | ICD-10-CM | POA: Diagnosis not present

## 2022-08-18 DIAGNOSIS — M810 Age-related osteoporosis without current pathological fracture: Secondary | ICD-10-CM | POA: Diagnosis not present

## 2022-09-14 ENCOUNTER — Other Ambulatory Visit: Payer: Self-pay | Admitting: Physician Assistant

## 2022-09-14 DIAGNOSIS — M0579 Rheumatoid arthritis with rheumatoid factor of multiple sites without organ or systems involvement: Secondary | ICD-10-CM

## 2022-09-15 NOTE — Telephone Encounter (Signed)
Last Fill: 06/16/2022  Eye exam: 12/16/2021   Labs: 06/10/2022 CBC stable. CMP stable.   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   Okay to refill Plaquenil?

## 2022-09-16 DIAGNOSIS — Z6820 Body mass index (BMI) 20.0-20.9, adult: Secondary | ICD-10-CM | POA: Diagnosis not present

## 2022-09-16 DIAGNOSIS — Z6822 Body mass index (BMI) 22.0-22.9, adult: Secondary | ICD-10-CM | POA: Diagnosis not present

## 2022-09-16 DIAGNOSIS — F03A0 Unspecified dementia, mild, without behavioral disturbance, psychotic disturbance, mood disturbance, and anxiety: Secondary | ICD-10-CM | POA: Diagnosis not present

## 2022-09-16 DIAGNOSIS — G4700 Insomnia, unspecified: Secondary | ICD-10-CM | POA: Diagnosis not present

## 2022-09-16 DIAGNOSIS — Z1389 Encounter for screening for other disorder: Secondary | ICD-10-CM | POA: Diagnosis not present

## 2022-09-16 DIAGNOSIS — Z0000 Encounter for general adult medical examination without abnormal findings: Secondary | ICD-10-CM | POA: Diagnosis not present

## 2022-09-16 DIAGNOSIS — I10 Essential (primary) hypertension: Secondary | ICD-10-CM | POA: Diagnosis not present

## 2022-09-16 NOTE — Progress Notes (Unsigned)
Office Visit Note  Patient: Michele Bryan             Date of Birth: 08/08/1940           MRN: [id number]             PCP: Sigmund Hazel, MD Referring: Sigmund Hazel, MD Visit Date: 09/30/2022 Occupation: @GUAROCC @  Subjective:  Medication monitoring  History of Present Illness: Michele Bryan is a 82 y.o. female with history of seropositive rheumatoid arthritis.  Patient remains on Olumiant 2 mg by mouth daily and plaquenil 200 mg 1 tablet by mouth daily.  She is tolerating combination therapy without any side effects and has not missed any doses recently.  Patient reports that her rheumatoid arthritis remains stable on the current treatment regimen.  She takes Tylenol as needed for pain relief and tramadol sparingly for moderate to severe pain.  She is currently in the process of tapering off of gabapentin as recommended by her PCP. She denies any recent or recurrent infections. Patient has discontinued Forteo and has been transition to IV Reclast.  She did not have any side effects with the recent IV Reclast infusion that was administered on 08/15/2022.  She has been taking vitamin D 50,000 units once weekly.  No recent falls. She denies any new medical conditions.   Activities of Daily Living:  Patient reports morning stiffness for 1 hour.   Patient Reports nocturnal pain.  Difficulty dressing/grooming: Denies Difficulty climbing stairs: Denies Difficulty getting out of chair: Denies Difficulty using hands for taps, buttons, cutlery, and/or writing: Denies  Review of Systems  Constitutional:  Negative for fatigue.  HENT:  Negative for mouth sores and mouth dryness.   Eyes:  Positive for photophobia. Negative for dryness.  Respiratory:  Negative for shortness of breath.   Cardiovascular:  Negative for chest pain and palpitations.  Gastrointestinal:  Positive for constipation. Negative for blood in stool and diarrhea.  Endocrine: Positive for increased urination.  Genitourinary:   Negative for involuntary urination.  Musculoskeletal:  Positive for joint pain, joint pain, joint swelling and morning stiffness. Negative for myalgias, muscle weakness, muscle tenderness and myalgias.  Skin:  Negative for color change, rash, hair loss and sensitivity to sunlight.  Allergic/Immunologic: Negative for susceptible to infections.  Neurological:  Negative for dizziness and headaches.  Hematological:  Negative for swollen glands.  Psychiatric/Behavioral:  Negative for depressed mood and sleep disturbance. The patient is not nervous/anxious.     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/2020   Gait abnormality 03/08/2020   Postoperative pain    Benign essential HTN    Constipation    Anemia of chronic disease    Essential hypertension    Orthostatic hypotension    Cervical myelopathy (HCC) 02/21/2020   Excoriation of ear canal, left, initial encounter 01/17/2020   Impacted cerumen of left ear 01/17/2020   Closed cervical spine fracture (HCC) 12/06/2019   Scapula fracture 03/05/2018   Fracture of acromial process of scapula, closed 02/15/2018   Chronic left shoulder pain 11/25/2017   Left cervical radiculopathy 09/29/2017   S/P shoulder replacement, left 05/15/2017   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  05/06/2016   High risk medication use 05/06/2016   Primary osteoarthritis of both hands 05/06/2016   Primary osteoarthritis of left knee 05/06/2016  Primary osteoarthritis of both feet 05/06/2016   Dyslipidemia 05/06/2016   Peripheral neuropathy 05/06/2016   Diverticulosis of intestine without bleeding 05/06/2016   Osteopenia  05/06/2016   Vitamin D deficiency 05/06/2016   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   History of GI bleed 05/06/2016   Cataract of  both eyes 05/06/2016   Degenerative joint disease of hand 05/06/2016   Osteoarthritis of foot joint 05/06/2016   Urinary urgency 04/30/2015   Chronic constipation 04/30/2015   MS (multiple sclerosis) (HCC) 03/23/2013   Expected blood loss anemia 06/16/2012   S/P right TKA 06/15/2012    Past Medical History:  Diagnosis Date   Anemia    Arthritis    GERD (gastroesophageal reflux disease)    History of blood transfusion    Hyperlipidemia    Hypertension    Lower extremity edema    MS (multiple sclerosis) (HCC)    Neuromuscular disorder (HCC)    multiple scleroosis/peripheral neuropathy   PONV (postoperative nausea and vomiting)    Rheumatoid arthritis (HCC)    Ulcer    Vitamin D deficiency     Family History  Problem Relation Age of Onset   Heart attack Father    Breast cancer Neg Hx    Past Surgical History:  Procedure Laterality Date   APPLICATION OF INTRAOPERATIVE CT SCAN N/A 02/16/2020   Procedure: APPLICATION OF INTRAOPERATIVE CT SCAN;  Surgeon: Jadene Pierini, MD;  Location: [facility] OR;  Service: Neurosurgery;  Laterality: N/A;   AUGMENTATION MAMMAPLASTY     BACK SURGERY     breast augumentation     BREAST SURGERY     biopsy   COLONOSCOPY     DILATION AND CURETTAGE OF UTERUS     EYE SURGERY     both eys,cataracts   goiter     HALO APPLICATION N/A 02/16/2020   Procedure: HALO TRACTION APPLICATION;  Surgeon: Jadene Pierini, MD;  Location: [facility] OR;  Service: Neurosurgery;  Laterality: N/A;   JOINT REPLACEMENT     KNEE ARTHROSCOPY Right    ORIF SHOULDER FRACTURE Left 03/05/2018   Procedure: OPEN REDUCTION INTERNAL FIXATION (ORIF) LEFT SCAPULA;  Surgeon: Beverely Low, MD;  Location: [facility] OR;  Service: Orthopedics;  Laterality: Left;   partial toe amputation Right 05/16/2022   2nd toe   POSTERIOR CERVICAL FUSION/FORAMINOTOMY N/A 12/06/2019   Procedure: Cervical one-two Posterior instrumented fusion;  Surgeon: Jadene Pierini, MD;  Location: [facility] OR;  Service:  Neurosurgery;  Laterality: N/A;   POSTERIOR CERVICAL FUSION/FORAMINOTOMY N/A 02/13/2020   Procedure: Revision of cervical instrumented fusion with Occiput to Cervical Four posterior instrumented fusion;  Surgeon: Jadene Pierini, MD;  Location: [facility] OR;  Service: Neurosurgery;  Laterality: N/A;  posterior   POSTERIOR CERVICAL FUSION/FORAMINOTOMY N/A 02/16/2020   Procedure: Occiput to Thoracic Two Posterior Cervical Fusion with AIRO and Application of Halo;  Surgeon: Jadene Pierini, MD;  Location: [facility] OR;  Service: Neurosurgery;  Laterality: N/A;   REVERSE SHOULDER ARTHROPLASTY Left 05/15/2017   Procedure: LEFT REVERSE SHOULDER ARTHROPLASTY;  Surgeon: Beverely Low, MD;  Location: [facility] OR;  Service: Orthopedics;  Laterality: Left;   REVERSE SHOULDER ARTHROPLASTY Left 10/11/2018   Procedure: left shoulder irrigation and debridement, open poly exchange and removal of painful hardware;  Surgeon: Beverely Low, MD;  Location: [facility] ORS;  Service: Orthopedics;  Laterality: Left;   SHOULDER HEMI-ARTHROPLASTY Left 12/11/2020   Procedure: Left reverse shoulder conversion to SHOULDER HEMI-ARTHROPLASTY;  Surgeon: Beverely Low, MD;  Location: [facility] OR[facility];  Service:  Orthopedics;  Laterality: Left;  with ISB   THYROID LOBECTOMY     TOTAL KNEE ARTHROPLASTY Right 06/15/2012   Procedure: RIGHT TOTAL KNEE ARTHROPLASTY;  Surgeon: [name], MD;  Location: [facility] OR[facility];  Service: Orthopedics;  Laterality: Right;   WRIST SURGERY Left    Social History   Social History Narrative   Patient lives at home with her husband [name]).   Retired.   Education- college.   Caffeine- Two cups of decaf tea daily.   Right handed.         Immunization History  Administered Date(s) Administered   Influenza, High Dose Seasonal PF 12/03/2013   Influenza,inj,quad, With Preservative 11/23/2014   Influenza-Unspecified 12/19/2017   PFIZER(Purple Top)SARS-COV-2 Vaccination 04/16/2019, 05/29/2019, 01/09/2020   Td 12/15/2017   Td  (Adult),5 Lf Tetanus Toxid, Preservative Free 12/15/2017   Zoster Recombinant(Shingrix) 08/10/2017, 01/05/2018   Zoster, Unspecified 08/10/2017, 01/05/2018     Objective: Vital Signs: BP 136/61 (BP Location: Left Arm, Patient Position: Sitting, Cuff Size: Normal)   Pulse 62   Resp 13   Ht 5\' 3"  (1.6 m)   Wt 114 lb (51.7 kg)   BMI 20.19 kg/m    Physical Exam Vitals and nursing note reviewed.  Constitutional:      Appearance: She is well-developed.  HENT:     Head: Normocephalic and atraumatic.  Eyes:     Conjunctiva/sclera: Conjunctivae normal.  Cardiovascular:     Rate and Rhythm: Normal rate and regular rhythm.     Heart sounds: Normal heart sounds.  Pulmonary:     Effort: Pulmonary effort is normal.     Breath sounds: Normal breath sounds.  Abdominal:     General: Bowel sounds are normal.     Palpations: Abdomen is soft.  Musculoskeletal:     Cervical back: Normal range of motion.  Lymphadenopathy:     Cervical: No cervical adenopathy.  Skin:    General: Skin is warm and dry.     Capillary Refill: Capillary refill takes less than 2 seconds.  Neurological:     Mental Status: She is alert and oriented to person, place, and time.  Psychiatric:        Behavior: Behavior normal.      Musculoskeletal Exam: Patient remained seated on the examination table during examination today.  Severely limited range of motion of the C-spine and both shoulders.  Elbow joints have good range of motion with no tenderness or synovitis.  Severe CMC joint prominence and thickening bilaterally.  Severe PIP and DIP thickening with subluxation involving multiple joints.  Inflammation noted in the right third and fourth PIP joints.  No tenderness or synovitis over MCP joints.  Right knee replacement has good range of motion.  Left knee joint has good range of motion no warmth or effusion.  Ankle joints have good range of motion with no tenderness or joint swelling.  CDAI Exam: CDAI Score:  -- Patient Global: 20 / 100; Provider Global: 20 / 100 Swollen: --; Tender: -- Joint Exam 09/30/2022   No joint exam has been documented for this visit   There is currently no information documented on the homunculus. Go to the Rheumatology activity and complete the homunculus joint exam.  Investigation: No additional findings.  Imaging: No results found.  Recent Labs: Lab Results  Component Value Date   WBC 6.0 06/10/2022   HGB 11.0 (L) 06/10/2022   PLT 315 06/10/2022   NA 141 08/04/2022   K 4.5 08/04/2022   CL  109 08/04/2022   CO2 22 08/04/2022   GLUCOSE 78 08/04/2022   BUN 24 08/04/2022   CREATININE 0.78 08/04/2022   BILITOT 0.9 06/10/2022   ALKPHOS 62 10/23/2021   AST 19 06/10/2022   ALT 13 06/10/2022   PROT 6.9 06/10/2022   ALBUMIN 4.6 10/23/2021   CALCIUM 10.0 08/04/2022   GFRAA 98 04/03/2020   QFTBGOLDPLUS NEGATIVE 02/18/2022    Speciality Comments: PLQ Eye Exam:12/16/2021 @ [facility] Follow up 1 year Osteoporosis managed by PCP-ACY 10/12/2018 Rinvoq-diarrhea, Xeljanz-12/19/20  Procedures:  No procedures performed Allergies: Meperidine hcl, Codeine, and Demerol [meperidine]    Assessment / Plan:     Visit Diagnoses: Rheumatoid arthritis involving multiple sites with positive rheumatoid factor (HCC): Stable.  She has not had any new or worsening symptoms since her last office visit.  She remains on Olumiant 2 mg daily and Plaquenil 200 mg 1 tablet by mouth daily.  She is tolerating combination therapy without any side effects.  She has not missed any doses recently.  No recent or recurrent infections.  She continues to have joint stiffness and discomfort but overall her symptoms have been manageable.  She is been taking Tylenol as needed for pain relief and tramadol sparingly for moderate to severe pain relief.  She will remain on Olumiant and Plaquenil as prescribed.  She was vies notify us if she develops signs or symptoms of a flare.  She will follow-up  in the office in 3 months or sooner if needed.  High risk medication use - Olumiant 2 mg by mouth daily and plaquenil 200 mg 1 tablet by mouth daily.  CBC and CMP updated on 06/10/22. Orders for CBC and CMP released today.  Her next lab work will be due in October and every 3 months.  Discussed the importance of holding olumiant if she develops signs or symptoms of an infection and to resume once the infection has completely cleared.  PLQ Eye Exam:12/16/2021 @ [facility] Follow up 1 year.  She will be establishing care at [facility] eye [facility].  TB gold negative on 02/18/22.    - Plan: CBC with Differential/Platelet, COMPLETE METABOLIC PANEL WITH GFR  H/O total shoulder replacement, left - Revision x2 performed by Dr. Devonne Doughty.  Chronic pain.  Status post right knee replacement: Doing well.  Good range of motion with no discomfort.  No warmth or effusion noted.  Primary osteoarthritis of both hands: Severe CMC, PIP, DIP thickening.  Subluxation of several PIP and DIP joints.  Inflammation noted in the right third and fourth PIP joints.  Primary osteoarthritis of left knee: Good range of motion of the left knee joint on examination today.  No warmth or effusion noted.  Primary osteoarthritis of both feet: She has been experiencing minimal discomfort in her feet.  Good range of motion of both ankle joints with no tenderness or synovitis today.  Closed fracture of cervical vertebra, unspecified cervical vertebral level, sequela - Fall in January 2020-she fractured C1 and C2 with displacement requiring fusion on 12/06/2019.  Osteopenia of multiple sites - 07/10/2021 DEXA BMD 0.728, T score -2.2, right femoral neck. Previously on fosamax by PCP--Developed vertebral fracture.  Complete Forteo-initiated on 04/03/20.   Transitioned to IV reclast--08/15/22.  She tolerated IV Reclast without any side effects. She is taking vitamin D 50,000 units once weekly.  Vitamin D will be rechecked today.  Postural kyphosis  of thoracic region - Severe kyphosis noted.  Vitamin D deficiency -Vitamin D 24 on 07/01/22--she has been  taking vitamin D 50,000 units once weekly.   Vitamin D level will be rechecked today.  Plan: VITAMIN D 25 Hydroxy (Vit-D Deficiency, Fractures)  Other medical conditions are listed as follows:  History of diverticulosis  History of GI bleed  History of peripheral neuropathy  History of multiple sclerosis (HCC)  History of cataract  Dyslipidemia  Orders: Orders Placed This Encounter  Procedures   CBC with Differential/Platelet   COMPLETE METABOLIC PANEL WITH GFR   VITAMIN D 25 Hydroxy (Vit-D Deficiency, Fractures)   No orders of the defined types were placed in this encounter.   Follow-Up Instructions: Return in about 3 months (around 12/31/2022) for Rheumatoid arthritis.   Gearldine Bienenstock, PA-C  Note - This record has been created using Dragon software.  Chart creation errors have been sought, but may not always  have been located. Such creation errors do not reflect on  the standard of medical care.

## 2022-09-28 ENCOUNTER — Other Ambulatory Visit: Payer: Self-pay | Admitting: Physician Assistant

## 2022-09-30 ENCOUNTER — Encounter: Payer: Self-pay | Admitting: Physician Assistant

## 2022-09-30 ENCOUNTER — Ambulatory Visit: Payer: Medicare PPO | Attending: Physician Assistant | Admitting: Physician Assistant

## 2022-09-30 ENCOUNTER — Other Ambulatory Visit: Payer: Self-pay

## 2022-09-30 VITALS — BP 136/61 | HR 62 | Resp 13 | Ht 63.0 in | Wt 114.0 lb

## 2022-09-30 DIAGNOSIS — G35 Multiple sclerosis: Secondary | ICD-10-CM

## 2022-09-30 DIAGNOSIS — S129XXS Fracture of neck, unspecified, sequela: Secondary | ICD-10-CM

## 2022-09-30 DIAGNOSIS — Z79899 Other long term (current) drug therapy: Secondary | ICD-10-CM

## 2022-09-30 DIAGNOSIS — E559 Vitamin D deficiency, unspecified: Secondary | ICD-10-CM

## 2022-09-30 DIAGNOSIS — Z8719 Personal history of other diseases of the digestive system: Secondary | ICD-10-CM

## 2022-09-30 DIAGNOSIS — M8589 Other specified disorders of bone density and structure, multiple sites: Secondary | ICD-10-CM | POA: Diagnosis not present

## 2022-09-30 DIAGNOSIS — M19071 Primary osteoarthritis, right ankle and foot: Secondary | ICD-10-CM | POA: Diagnosis not present

## 2022-09-30 DIAGNOSIS — M19072 Primary osteoarthritis, left ankle and foot: Secondary | ICD-10-CM

## 2022-09-30 DIAGNOSIS — M19041 Primary osteoarthritis, right hand: Secondary | ICD-10-CM

## 2022-09-30 DIAGNOSIS — Z96612 Presence of left artificial shoulder joint: Secondary | ICD-10-CM

## 2022-09-30 DIAGNOSIS — M1712 Unilateral primary osteoarthritis, left knee: Secondary | ICD-10-CM | POA: Diagnosis not present

## 2022-09-30 DIAGNOSIS — M0579 Rheumatoid arthritis with rheumatoid factor of multiple sites without organ or systems involvement: Secondary | ICD-10-CM

## 2022-09-30 DIAGNOSIS — M19042 Primary osteoarthritis, left hand: Secondary | ICD-10-CM

## 2022-09-30 DIAGNOSIS — M4004 Postural kyphosis, thoracic region: Secondary | ICD-10-CM

## 2022-09-30 DIAGNOSIS — Z96651 Presence of right artificial knee joint: Secondary | ICD-10-CM

## 2022-09-30 DIAGNOSIS — Z8669 Personal history of other diseases of the nervous system and sense organs: Secondary | ICD-10-CM

## 2022-09-30 DIAGNOSIS — E785 Hyperlipidemia, unspecified: Secondary | ICD-10-CM

## 2022-09-30 LAB — CBC WITH DIFFERENTIAL/PLATELET
Absolute Monocytes: 564 cells/uL (ref 200–950)
Basophils Absolute: 31 cells/uL (ref 0–200)
Basophils Relative: 0.5 %
Eosinophils Absolute: 143 cells/uL (ref 15–500)
Eosinophils Relative: 2.3 %
HCT: 35.6 % (ref 35.0–45.0)
Hemoglobin: 11.6 g/dL — ABNORMAL LOW (ref 11.7–15.5)
Lymphs Abs: 2275 cells/uL (ref 850–3900)
MCH: 30.7 pg (ref 27.0–33.0)
MCHC: 32.6 g/dL (ref 32.0–36.0)
MCV: 94.2 fL (ref 80.0–100.0)
MPV: 9.5 fL (ref 7.5–12.5)
Monocytes Relative: 9.1 %
Neutro Abs: 3187 cells/uL (ref 1500–7800)
Neutrophils Relative %: 51.4 %
Platelets: 259 10*3/uL (ref 140–400)
RBC: 3.78 10*6/uL — ABNORMAL LOW (ref 3.80–5.10)
RDW: 12.9 % (ref 11.0–15.0)
Total Lymphocyte: 36.7 %
WBC: 6.2 10*3/uL (ref 3.8–10.8)

## 2022-09-30 MED ORDER — BARICITINIB 2 MG PO TABS
2.0000 mg | ORAL_TABLET | Freq: Every day | ORAL | 0 refills | Status: DC
Start: 2022-09-30 — End: 2023-05-05

## 2022-09-30 NOTE — Patient Instructions (Signed)
Standing Labs We placed an order today for your standing lab work.   Please have your standing labs drawn in October and every 3 months   Please have your labs drawn 2 weeks prior to your appointment so that the provider can discuss your lab results at your appointment, if possible.  Please note that you may see your imaging and lab results in MyChart before we have reviewed them. We will contact you once all results are reviewed. Please allow our office up to 72 hours to thoroughly review all of the results before contacting the office for clarification of your results.  WALK-IN LAB HOURS  Monday through Thursday from 8:00 am -12:30 pm and 1:00 pm-5:00 pm and Friday from 8:00 am-12:00 pm.  Patients with office visits requiring labs will be seen before walk-in labs.  You may encounter longer than normal wait times. Please allow additional time. Wait times may be shorter on  Monday and Thursday afternoons.  We do not book appointments for walk-in labs. We appreciate your patience and understanding with our staff.   Labs are drawn by [facility]. Please bring your co-pay at the time of your lab draw.  You may receive a bill from [facility] for your lab work.  Please note if you are on Hydroxychloroquine and and an order has been placed for a Hydroxychloroquine level,  you will need to have it drawn 4 hours or more after your last dose.  If you wish to have your labs drawn at another location, please call the office 24 hours in advance so we can fax the orders.  The office is located at [address], [address], [place], [place] [address]   If you have any questions regarding directions or hours of operation,  please call [phone].   As a reminder, please drink plenty of water prior to coming for your lab work. Thanks!  If you have signs or symptoms of an infection or start antibiotics: First, call your PCP for workup of your infection. Hold your medication through the infection, until you  complete your antibiotics, and until symptoms resolve if you take the following: Injectable medication (Actemra, Benlysta, Cimzia, Cosentyx, Enbrel, Humira, Kevzara, Orencia, Remicade, Simponi, Stelara, Taltz, Tremfya) Methotrexate Leflunomide (Arava) Mycophenolate (Cellcept) Harriette Ohara, Olumiant, or Rinvoq

## 2022-09-30 NOTE — Telephone Encounter (Signed)
Please review and send pended Olumiant refill. Thanks!

## 2022-10-01 NOTE — Progress Notes (Signed)
CMP WNL Hemoglobin and RBC count are low but have improved. Rest of CBC WNL. Vitamin D WNL-81. Continue maintenance dose of vitamin D OTC

## 2022-10-28 ENCOUNTER — Other Ambulatory Visit: Payer: Self-pay | Admitting: Physician Assistant

## 2022-10-28 DIAGNOSIS — M0579 Rheumatoid arthritis with rheumatoid factor of multiple sites without organ or systems involvement: Secondary | ICD-10-CM

## 2022-11-25 DIAGNOSIS — Z79899 Other long term (current) drug therapy: Secondary | ICD-10-CM | POA: Diagnosis not present

## 2022-11-25 DIAGNOSIS — G35 Multiple sclerosis: Secondary | ICD-10-CM | POA: Diagnosis not present

## 2022-11-25 DIAGNOSIS — H468 Other optic neuritis: Secondary | ICD-10-CM | POA: Diagnosis not present

## 2022-11-25 DIAGNOSIS — Z961 Presence of intraocular lens: Secondary | ICD-10-CM | POA: Diagnosis not present

## 2022-11-25 DIAGNOSIS — M069 Rheumatoid arthritis, unspecified: Secondary | ICD-10-CM | POA: Diagnosis not present

## 2022-12-17 ENCOUNTER — Other Ambulatory Visit: Payer: Self-pay | Admitting: Physician Assistant

## 2022-12-17 DIAGNOSIS — M0579 Rheumatoid arthritis with rheumatoid factor of multiple sites without organ or systems involvement: Secondary | ICD-10-CM

## 2022-12-17 NOTE — Progress Notes (Signed)
Office Visit Note  Patient: Michele Bryan             Date of Birth: 1941-01-22           MRN: [id number]             PCP: Sigmund Hazel, MD Referring: Sigmund Hazel, MD Visit Date: 12/31/2022 Occupation: @GUAROCC @  Subjective:  Medication monitoring   History of Present Illness: Michele Bryan is a 82 y.o. female with history of seropositive rheumatoid arthritis and osteoarthritis.  Patient remains on Olumiant 2 mg by mouth daily and plaquenil 200 mg 1 tablet by mouth daily.  She is tolerating combination therapy without any side effects and has not had any interruptions in therapy.  She denies any signs or symptoms of an infection recently.  Patient continues to experience intermittent pain in both hands but states that she has been able to use her hands to perform her ADLs as well as yard work recently.  Patient states that she has had increased discomfort in her right shoulder joint.  She has reached out to Dr. Ranell Patrick at [facility] for further evaluation.  She has been taking tylenol for pain relief.  Patient received IV reclast on 08/15/22.  She denies any recent falls or fractures.    Activities of Daily Living:  Patient reports morning stiffness for several hours.   Patient Denies nocturnal pain.  Difficulty dressing/grooming: Denies Difficulty climbing stairs: Reports Difficulty getting out of chair: Reports Difficulty using hands for taps, buttons, cutlery, and/or writing: Reports  Review of Systems  Constitutional:  Positive for fatigue.  HENT:  Negative for mouth sores and mouth dryness.   Eyes:  Negative for dryness.  Respiratory:  Negative for shortness of breath.   Cardiovascular:  Negative for chest pain and palpitations.  Gastrointestinal:  Positive for constipation and diarrhea. Negative for blood in stool.  Endocrine: Positive for increased urination.  Genitourinary:  Positive for involuntary urination.  Musculoskeletal:  Positive for myalgias, morning  stiffness, muscle tenderness and myalgias. Negative for joint pain, gait problem, joint pain, joint swelling and muscle weakness.  Skin:  Negative for color change, rash, hair loss and sensitivity to sunlight.  Allergic/Immunologic: Negative for susceptible to infections.  Neurological:  Negative for dizziness and headaches.  Hematological:  Negative for swollen glands.  Psychiatric/Behavioral:  Negative for depressed mood and sleep disturbance. The patient is not nervous/anxious.     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/2020   Gait abnormality 03/08/2020   Postoperative pain    Benign essential HTN    Constipation    Anemia of chronic disease    Essential hypertension    Orthostatic hypotension    Cervical myelopathy (HCC) 02/21/2020   Excoriation of ear canal, left, initial encounter 01/17/2020   Impacted cerumen of left ear 01/17/2020   Closed cervical spine fracture (HCC) 12/06/2019   Scapula fracture 03/05/2018   Fracture of acromial process of scapula, closed 02/15/2018   Chronic left shoulder pain 11/25/2017   Left cervical radiculopathy 09/29/2017   S/P shoulder replacement, left 05/15/2017   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  05/06/2016   High risk medication use 05/06/2016   Primary osteoarthritis of both hands 05/06/2016   Primary osteoarthritis of left knee 05/06/2016   Primary osteoarthritis of both feet 05/06/2016  Dyslipidemia 05/06/2016   Peripheral neuropathy 05/06/2016   Diverticulosis of intestine without bleeding 05/06/2016   Osteopenia  05/06/2016   Vitamin D deficiency 05/06/2016   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   History of GI bleed 05/06/2016   Cataract of both eyes 05/06/2016   Degenerative joint disease of hand 05/06/2016    Osteoarthritis of foot joint 05/06/2016   Urinary urgency 04/30/2015   Chronic constipation 04/30/2015   MS (multiple sclerosis) (HCC) 03/23/2013   Expected blood loss anemia 06/16/2012   S/P right TKA 06/15/2012    Past Medical History:  Diagnosis Date   Anemia    Arthritis    GERD (gastroesophageal reflux disease)    History of blood transfusion    Hyperlipidemia    Hypertension    Lower extremity edema    MS (multiple sclerosis) (HCC)    Neuromuscular disorder (HCC)    multiple scleroosis/peripheral neuropathy   PONV (postoperative nausea and vomiting)    Rheumatoid arthritis (HCC)    Ulcer    Vitamin D deficiency     Family History  Problem Relation Age of Onset   Heart attack Father    Breast cancer Neg Hx    Past Surgical History:  Procedure Laterality Date   APPLICATION OF INTRAOPERATIVE CT SCAN N/A 02/16/2020   Procedure: APPLICATION OF INTRAOPERATIVE CT SCAN;  Surgeon: Jadene Pierini, MD;  Location: [facility] OR;  Service: Neurosurgery;  Laterality: N/A;   AUGMENTATION MAMMAPLASTY     BACK SURGERY     breast augumentation     BREAST SURGERY     biopsy   COLONOSCOPY     DILATION AND CURETTAGE OF UTERUS     EYE SURGERY     both eys,cataracts   goiter     HALO APPLICATION N/A 02/16/2020   Procedure: HALO TRACTION APPLICATION;  Surgeon: Jadene Pierini, MD;  Location: [facility] OR;  Service: Neurosurgery;  Laterality: N/A;   JOINT REPLACEMENT     KNEE ARTHROSCOPY Right    ORIF SHOULDER FRACTURE Left 03/05/2018   Procedure: OPEN REDUCTION INTERNAL FIXATION (ORIF) LEFT SCAPULA;  Surgeon: Beverely Low, MD;  Location: [facility] OR;  Service: Orthopedics;  Laterality: Left;   partial toe amputation Right 05/16/2022   2nd toe   POSTERIOR CERVICAL FUSION/FORAMINOTOMY N/A 12/06/2019   Procedure: Cervical one-two Posterior instrumented fusion;  Surgeon: Jadene Pierini, MD;  Location: [facility] OR;  Service: Neurosurgery;  Laterality: N/A;   POSTERIOR CERVICAL FUSION/FORAMINOTOMY  N/A 02/13/2020   Procedure: Revision of cervical instrumented fusion with Occiput to Cervical Four posterior instrumented fusion;  Surgeon: Jadene Pierini, MD;  Location: [facility] OR;  Service: Neurosurgery;  Laterality: N/A;  posterior   POSTERIOR CERVICAL FUSION/FORAMINOTOMY N/A 02/16/2020   Procedure: Occiput to Thoracic Two Posterior Cervical Fusion with AIRO and Application of Halo;  Surgeon: Jadene Pierini, MD;  Location: [facility] OR;  Service: Neurosurgery;  Laterality: N/A;   REVERSE SHOULDER ARTHROPLASTY Left 05/15/2017   Procedure: LEFT REVERSE SHOULDER ARTHROPLASTY;  Surgeon: Beverely Low, MD;  Location: [facility] OR;  Service: Orthopedics;  Laterality: Left;   REVERSE SHOULDER ARTHROPLASTY Left 10/11/2018   Procedure: left shoulder irrigation and debridement, open poly exchange and removal of painful hardware;  Surgeon: Beverely Low, MD;  Location: [facility];  Service: Orthopedics;  Laterality: Left;   SHOULDER HEMI-ARTHROPLASTY Left 12/11/2020   Procedure: Left reverse shoulder conversion to SHOULDER HEMI-ARTHROPLASTY;  Surgeon: Beverely Low, MD;  Location: [facility];  Service: Orthopedics;  Laterality: Left;  with ISB  THYROID LOBECTOMY     TOTAL KNEE ARTHROPLASTY Right 06/15/2012   Procedure: RIGHT TOTAL KNEE ARTHROPLASTY;  Surgeon: [name], MD;  Location: [facility];  Service: Orthopedics;  Laterality: Right;   WRIST SURGERY Left    Social History   Social History Narrative   Patient lives at home with her husband [name]).   Retired.   Education- college.   Caffeine- Two cups of decaf tea daily.   Right handed.         Immunization History  Administered Date(s) Administered   Influenza, High Dose Seasonal PF 12/03/2013   Influenza,inj,quad, With Preservative 11/23/2014   Influenza-Unspecified 12/19/2017   PFIZER(Purple Top)SARS-COV-2 Vaccination 04/16/2019, 05/29/2019, 01/09/2020   Td 12/15/2017   Td (Adult),5 Lf Tetanus Toxid, Preservative Free 12/15/2017   Zoster  Recombinant(Shingrix) 08/10/2017, 01/05/2018   Zoster, Unspecified 08/10/2017, 01/05/2018     Objective: Vital Signs: BP 125/72 (BP Location: Left Arm, Patient Position: Sitting, Cuff Size: Normal)   Pulse (!) 59   Resp 13   Ht 5\' 3"  (1.6 m)   Wt 113 lb 12.8 oz (51.6 kg)   BMI 20.16 kg/m    Physical Exam Vitals and nursing note reviewed.  Constitutional:      Appearance: She is well-developed.  HENT:     Head: Normocephalic and atraumatic.  Eyes:     Conjunctiva/sclera: Conjunctivae normal.  Cardiovascular:     Rate and Rhythm: Normal rate and regular rhythm.     Heart sounds: Normal heart sounds.  Pulmonary:     Effort: Pulmonary effort is normal.     Breath sounds: Normal breath sounds.  Abdominal:     General: Bowel sounds are normal.     Palpations: Abdomen is soft.  Musculoskeletal:     Cervical back: Normal range of motion.  Lymphadenopathy:     Cervical: No cervical adenopathy.  Skin:    General: Skin is warm and dry.     Capillary Refill: Capillary refill takes less than 2 seconds.  Neurological:     Mental Status: She is alert and oriented to person, place, and time.  Psychiatric:        Behavior: Behavior normal.      Musculoskeletal Exam: Patient remained seated on the exam table during examination today.  C-spine has severely limited range of motion.  Thoracic kyphosis noted.  Severely limited range of motion of both shoulders.  Tenderness outpatient over the right shoulder.  Elbow joints have good range of motion with no tenderness or inflammation.  Severe CMC joint thickening noted bilaterally.  Severe PIP and DIP thickening with subluxation involving multiple joints.  Tenderness and inflammation noted over the right third and fourth PIP joints.  Inflammation noted in the left second MCP joint.  Right knee replacement has good range of motion.  Left knee joint has good range of motion with no warmth or effusion.  Ankle joints have good range of motion no  tenderness or joint swelling.  No tenderness over MTP joints.  CDAI Exam: CDAI Score: -- Patient Global: --; Provider Global: -- Swollen: 3 ; Tender: 3  Joint Exam 12/31/2022      Right  Left  MCP 2     Swollen Tender  PIP 3 (finger)  Swollen Tender     PIP 4 (finger)  Swollen Tender        Investigation: No additional findings.  Imaging: No results found.  Recent Labs: Lab Results  Component Value Date   WBC 6.2 09/30/2022  HGB 11.6 (L) 09/30/2022   PLT 259 09/30/2022   NA 141 09/30/2022   K 4.6 09/30/2022   CL 108 09/30/2022   CO2 28 09/30/2022   GLUCOSE 76 09/30/2022   BUN 20 09/30/2022   CREATININE 0.88 09/30/2022   BILITOT 1.1 09/30/2022   ALKPHOS 62 10/23/2021   AST 24 09/30/2022   ALT 17 09/30/2022   PROT 6.8 09/30/2022   ALBUMIN 4.6 10/23/2021   CALCIUM 9.8 09/30/2022   GFRAA 98 04/03/2020   QFTBGOLDPLUS NEGATIVE 02/18/2022    Speciality Comments: PLQ Eye Exam: 11/25/2022 WNL @ [facility]. Follow up in 1 year  Osteoporosis managed by [facility] 10/12/2018 Rinvoq-diarrhea, Xeljanz-12/19/20  Procedures:  No procedures performed Allergies: Meperidine hcl, Codeine, and Demerol [meperidine]   Assessment / Plan:     Visit Diagnoses: Rheumatoid arthritis involving multiple sites with positive rheumatoid factor [facility]): Patient has severe rheumatoid arthritis and osteoarthritis overlap.  She has been experiencing increased discomfort in her right shoulder and has ongoing pain involving both hands.  She remains on Olumiant 2 mg by mouth daily and Plaquenil 200 mg 1 tablet by mouth daily.  She is tolerating combination therapy without any side effects.  She has not had any recent or recurrent infections.  She has been taking Tylenol as needed for pain relief.  Patient's treatment options remain limited--previous therapy includes Xeljanz, Rinvoq, methotrexate and Orencia.  She is not a good candidate for TNF inhibitors given history of MS. Overall her  symptoms have remained stable on the current treatment regimen.  She continues to find Olumiant and Plaquenil to be the most effective combination she has taken.  No medication changes will be made at this time.  She was advised to notify us if she develops any new or worsening symptoms.  She will follow-up in the office in 3 months or sooner if needed.  High risk medication use - Olumiant 2 mg by mouth daily and plaquenil 200 mg 1 tablet by mouth daily.  Previous therapy includes Xeljanz, Rinvoq, Orencia, and methotrexate (D/c due to SOB).  Not a good candidate for TNF inhibitors given history of MS. PLQ Eye Exam: 11/25/2022 WNL @ [facility]. Follow up in 1 year  CBC and CMP updated on 09/30/22. Orders for CBC and CMP released today.  Her next lab work will be due in January and every 3 months to monitor for drug toxicity. TB gold negative on 02/18/22. Future order placed today.  No recent or recurrent infections. Discussed the importance of holding olumiant if she develops signs or symptoms of an infection and to resume once the infection has completely cleared.  She received annual flu shot as well as the updated COVID-vaccine. Up to date with pneumonia and shingrix vaccines.  Counseled on the increase risk of venous thrombosis. Counseled about FDA black box warning of MACE (major adverse CV events including cardiovascular death, myocardial infarction, and stroke).   - Plan: CBC with Differential/Platelet, COMPLETE METABOLIC PANEL WITH GFR, QuantiFERON-TB Gold Plus  Screening for tuberculosis -Future order for TB gold placed today. Plan: QuantiFERON-TB Gold Plus  Chronic right shoulder pain: Severely limited range of motion with discomfort.  Patient has been experiencing increased pain involving her right shoulder.  She has reached out to Dr. Ranell Patrick for further evaluation.   H/O total shoulder replacement, left: Revision x2 performed by Dr. Devonne Doughty.   Status post right knee  replacement: Doing well.  Good ROM with no discomfort.   Primary osteoarthritis  of both hands: Severely thickened CMC, PIP, and DIP joints.  Subluxation of several PIP and DIP joints involving both hands.  Inflammation noted in right 3rd and 4th PIP joints.   Primary osteoarthritis of left knee: She has good ROM of the left knee joint with no warmth or effusion.   Primary osteoarthritis of both feet: 05/16/22-second right toe amputation performed by Dr. Lilian Kapur.   Closed fracture of cervical vertebra, unspecified cervical vertebral level, sequela - Fall in January 2020-she fractured C1 and C2 with displacement requiring fusion on 12/06/2019.Severely limited ROM.  Osteopenia of multiple sites - 07/10/2021 DEXA BMD 0.728, T score -2.2, right femoral neck. Previously on fosamax by PCP-Developed vertebral fracture. Completed 2 years of Forteo. Transitioned to IV Reclast on 08/15/2022. She is taking vitamin D 2000 units daily.  Vitamin D was within normal limits: 81 on 09/30/2022. Due to update DEXA in May 2025.  Medication monitoring encounter - Complete Forteo-initiated on 04/03/20. Transitioned to IV reclast--08/15/22.  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  Vitamin D deficiency: Vitamin D was 81 on 09/30/2022.  She is taking vitamin D 2000 units daily.  Other medical conditions are listed as follows:   History of GI bleed  History of diverticulosis  History of cataract  Dyslipidemia  History of peripheral neuropathy  History of multiple sclerosis (HCC)    Orders: Orders Placed This Encounter  Procedures   CBC with Differential/Platelet   COMPLETE METABOLIC PANEL WITH GFR   QuantiFERON-TB Gold Plus   No orders of the defined types were placed in this encounter.    Follow-Up Instructions: Return in about 3 months (around 04/02/2023) for Rheumatoid arthritis.   Gearldine Bienenstock, PA-C  Note - This record has been created using Dragon software.  Chart  creation errors have been sought, but may not always  have been located. Such creation errors do not reflect on  the standard of medical care.

## 2022-12-17 NOTE — Telephone Encounter (Signed)
Last Fill: 09/15/2022  Eye exam: 11/25/2022 WNL   Labs: 09/30/2022 CMP WNL Hemoglobin and RBC count are low but have improved. Rest of CBC WNL.  Next Visit: 12/31/2022  Last Visit: 09/30/2022  [id number] arthritis involving multiple sites with positive rheumatoid factor   Current Dose per office note 09/30/2022: plaquenil 200 mg 1 tablet by mouth daily.   Okay to refill Plaquenil?

## 2022-12-24 DIAGNOSIS — G609 Hereditary and idiopathic neuropathy, unspecified: Secondary | ICD-10-CM | POA: Diagnosis not present

## 2022-12-24 DIAGNOSIS — E7800 Pure hypercholesterolemia, unspecified: Secondary | ICD-10-CM | POA: Diagnosis not present

## 2022-12-24 DIAGNOSIS — R222 Localized swelling, mass and lump, trunk: Secondary | ICD-10-CM | POA: Diagnosis not present

## 2022-12-24 DIAGNOSIS — Z6822 Body mass index (BMI) 22.0-22.9, adult: Secondary | ICD-10-CM | POA: Diagnosis not present

## 2022-12-24 DIAGNOSIS — G4700 Insomnia, unspecified: Secondary | ICD-10-CM | POA: Diagnosis not present

## 2022-12-24 DIAGNOSIS — I10 Essential (primary) hypertension: Secondary | ICD-10-CM | POA: Diagnosis not present

## 2022-12-31 ENCOUNTER — Ambulatory Visit: Payer: Medicare PPO | Attending: Physician Assistant | Admitting: Physician Assistant

## 2022-12-31 ENCOUNTER — Encounter: Payer: Self-pay | Admitting: Physician Assistant

## 2022-12-31 VITALS — BP 125/72 | HR 59 | Resp 13 | Ht 63.0 in | Wt 113.8 lb

## 2022-12-31 DIAGNOSIS — Z96651 Presence of right artificial knee joint: Secondary | ICD-10-CM

## 2022-12-31 DIAGNOSIS — M8589 Other specified disorders of bone density and structure, multiple sites: Secondary | ICD-10-CM | POA: Diagnosis not present

## 2022-12-31 DIAGNOSIS — M1712 Unilateral primary osteoarthritis, left knee: Secondary | ICD-10-CM | POA: Diagnosis not present

## 2022-12-31 DIAGNOSIS — Z79899 Other long term (current) drug therapy: Secondary | ICD-10-CM

## 2022-12-31 DIAGNOSIS — Z96612 Presence of left artificial shoulder joint: Secondary | ICD-10-CM | POA: Diagnosis not present

## 2022-12-31 DIAGNOSIS — M19041 Primary osteoarthritis, right hand: Secondary | ICD-10-CM | POA: Diagnosis not present

## 2022-12-31 DIAGNOSIS — M19071 Primary osteoarthritis, right ankle and foot: Secondary | ICD-10-CM

## 2022-12-31 DIAGNOSIS — E785 Hyperlipidemia, unspecified: Secondary | ICD-10-CM

## 2022-12-31 DIAGNOSIS — E559 Vitamin D deficiency, unspecified: Secondary | ICD-10-CM

## 2022-12-31 DIAGNOSIS — G35D Multiple sclerosis, unspecified: Secondary | ICD-10-CM

## 2022-12-31 DIAGNOSIS — M19042 Primary osteoarthritis, left hand: Secondary | ICD-10-CM

## 2022-12-31 DIAGNOSIS — S129XXS Fracture of neck, unspecified, sequela: Secondary | ICD-10-CM | POA: Diagnosis not present

## 2022-12-31 DIAGNOSIS — Z5181 Encounter for therapeutic drug level monitoring: Secondary | ICD-10-CM

## 2022-12-31 DIAGNOSIS — M0579 Rheumatoid arthritis with rheumatoid factor of multiple sites without organ or systems involvement: Secondary | ICD-10-CM | POA: Diagnosis not present

## 2022-12-31 DIAGNOSIS — Z111 Encounter for screening for respiratory tuberculosis: Secondary | ICD-10-CM

## 2022-12-31 DIAGNOSIS — G8929 Other chronic pain: Secondary | ICD-10-CM

## 2022-12-31 DIAGNOSIS — G35 Multiple sclerosis: Secondary | ICD-10-CM

## 2022-12-31 DIAGNOSIS — M25511 Pain in right shoulder: Secondary | ICD-10-CM

## 2022-12-31 DIAGNOSIS — Z8669 Personal history of other diseases of the nervous system and sense organs: Secondary | ICD-10-CM

## 2022-12-31 DIAGNOSIS — Z8719 Personal history of other diseases of the digestive system: Secondary | ICD-10-CM

## 2022-12-31 DIAGNOSIS — M4004 Postural kyphosis, thoracic region: Secondary | ICD-10-CM

## 2022-12-31 DIAGNOSIS — M19072 Primary osteoarthritis, left ankle and foot: Secondary | ICD-10-CM

## 2022-12-31 NOTE — Patient Instructions (Signed)
Standing Labs We placed an order today for your standing lab work.   Please have your standing labs drawn at end of January and every 3 months   Please have your labs drawn 2 weeks prior to your appointment so that the provider can discuss your lab results at your appointment, if possible.  Please note that you may see your imaging and lab results in MyChart before we have reviewed them. We will contact you once all results are reviewed. Please allow our office up to 72 hours to thoroughly review all of the results before contacting the office for clarification of your results.  WALK-IN LAB HOURS  Monday through Thursday from 8:00 am -12:30 pm and 1:00 pm-5:00 pm and Friday from 8:00 am-12:00 pm.  Patients with office visits requiring labs will be seen before walk-in labs.  You may encounter longer than normal wait times. Please allow additional time. Wait times may be shorter on  Monday and Thursday afternoons.  We do not book appointments for walk-in labs. We appreciate your patience and understanding with our staff.   Labs are drawn by [facility]. Please bring your co-pay at the time of your lab draw.  You may receive a bill from [facility] for your lab work.  Please note if you are on Hydroxychloroquine and and an order has been placed for a Hydroxychloroquine level,  you will need to have it drawn 4 hours or more after your last dose.  If you wish to have your labs drawn at another location, please call the office 24 hours in advance so we can fax the orders.  The office is located at [address], [address], [place], [place] [address]   If you have any questions regarding directions or hours of operation,  please call [phone].   As a reminder, please drink plenty of water prior to coming for your lab work. Thanks!

## 2023-01-01 LAB — COMPLETE METABOLIC PANEL WITH GFR
AG Ratio: 2.1 (calc) (ref 1.0–2.5)
ALT: 14 U/L (ref 6–29)
AST: 20 U/L (ref 10–35)
Albumin: 4.5 g/dL (ref 3.6–5.1)
Alkaline phosphatase (APISO): 44 U/L (ref 37–153)
BUN/Creatinine Ratio: 31 (calc) — ABNORMAL HIGH (ref 6–22)
BUN: 26 mg/dL — ABNORMAL HIGH (ref 7–25)
CO2: 28 mmol/L (ref 20–32)
Calcium: 9.9 mg/dL (ref 8.6–10.4)
Chloride: 107 mmol/L (ref 98–110)
Creat: 0.83 mg/dL (ref 0.60–1.00)
Globulin: 2.1 g/dL (ref 1.9–3.7)
Glucose, Bld: 80 mg/dL (ref 65–99)
Potassium: 4.2 mmol/L (ref 3.5–5.3)
Sodium: 142 mmol/L (ref 135–146)
Total Bilirubin: 1.1 mg/dL (ref 0.2–1.2)
Total Protein: 6.6 g/dL (ref 6.1–8.1)
eGFR: 72 mL/min/{1.73_m2} (ref >=60)

## 2023-01-01 LAB — CBC WITH DIFFERENTIAL/PLATELET
Absolute Lymphocytes: 1669 {cells}/uL (ref 850–3900)
Absolute Monocytes: 451 {cells}/uL (ref 200–950)
Basophils Absolute: 19 {cells}/uL (ref 0–200)
Basophils Relative: 0.4 %
Eosinophils Absolute: 0 {cells}/uL — ABNORMAL LOW (ref 15–500)
Eosinophils Relative: 0 %
HCT: 35.5 % (ref 35.0–45.0)
Hemoglobin: 11 g/dL — ABNORMAL LOW (ref 11.7–15.5)
MCH: 30.7 pg (ref 27.0–33.0)
MCHC: 31 g/dL — ABNORMAL LOW (ref 32.0–36.0)
MCV: 99.2 fL (ref 80.0–100.0)
MPV: 9.9 fL (ref 7.5–12.5)
Monocytes Relative: 9.6 %
Neutro Abs: 2562 {cells}/uL (ref 1500–7800)
Neutrophils Relative %: 54.5 %
Platelets: 251 10*3/uL (ref 140–400)
RBC: 3.58 10*6/uL — ABNORMAL LOW (ref 3.80–5.10)
RDW: 12.3 % (ref 11.0–15.0)
Total Lymphocyte: 35.5 %
WBC: 4.7 10*3/uL (ref 3.8–10.8)

## 2023-01-01 NOTE — Progress Notes (Signed)
CMP stable.  Patient remains anemic--patient is taking a multivitamin. We will continue to monitor closely.

## 2023-01-21 DIAGNOSIS — M19011 Primary osteoarthritis, right shoulder: Secondary | ICD-10-CM | POA: Diagnosis not present

## 2023-01-25 ENCOUNTER — Other Ambulatory Visit: Payer: Self-pay | Admitting: Physician Assistant

## 2023-01-25 DIAGNOSIS — M0579 Rheumatoid arthritis with rheumatoid factor of multiple sites without organ or systems involvement: Secondary | ICD-10-CM

## 2023-02-12 ENCOUNTER — Other Ambulatory Visit: Payer: Self-pay | Admitting: Physician Assistant

## 2023-02-12 DIAGNOSIS — M0579 Rheumatoid arthritis with rheumatoid factor of multiple sites without organ or systems involvement: Secondary | ICD-10-CM

## 2023-02-17 DIAGNOSIS — L814 Other melanin hyperpigmentation: Secondary | ICD-10-CM | POA: Diagnosis not present

## 2023-02-17 DIAGNOSIS — D1801 Hemangioma of skin and subcutaneous tissue: Secondary | ICD-10-CM | POA: Diagnosis not present

## 2023-02-17 DIAGNOSIS — D692 Other nonthrombocytopenic purpura: Secondary | ICD-10-CM | POA: Diagnosis not present

## 2023-02-17 DIAGNOSIS — L821 Other seborrheic keratosis: Secondary | ICD-10-CM | POA: Diagnosis not present

## 2023-02-24 DIAGNOSIS — Z23 Encounter for immunization: Secondary | ICD-10-CM | POA: Diagnosis not present

## 2023-02-24 DIAGNOSIS — R222 Localized swelling, mass and lump, trunk: Secondary | ICD-10-CM | POA: Diagnosis not present

## 2023-02-24 DIAGNOSIS — R1032 Left lower quadrant pain: Secondary | ICD-10-CM | POA: Diagnosis not present

## 2023-02-24 DIAGNOSIS — Z6822 Body mass index (BMI) 22.0-22.9, adult: Secondary | ICD-10-CM | POA: Diagnosis not present

## 2023-02-24 DIAGNOSIS — G609 Hereditary and idiopathic neuropathy, unspecified: Secondary | ICD-10-CM | POA: Diagnosis not present

## 2023-02-26 ENCOUNTER — Telehealth: Payer: Self-pay | Admitting: Pharmacist

## 2023-02-26 ENCOUNTER — Other Ambulatory Visit: Payer: Self-pay | Admitting: Family Medicine

## 2023-02-26 DIAGNOSIS — R222 Localized swelling, mass and lump, trunk: Secondary | ICD-10-CM

## 2023-02-26 NOTE — Telephone Encounter (Signed)
Submitted a Prior Authorization RENEWAL request to [facility] for OLUMIANT via CoverMyMeds. Will update once we receive a response.  Key: [id number]

## 2023-02-26 NOTE — Telephone Encounter (Signed)
Received notification from [facility] regarding a prior authorization for OLUMIANT. Authorization has been APPROVED from 03/03/2022 to 03/02/2024. Approval letter sent to scan center.  Authorization # [id number]  Chesley Mires, PharmD, MPH, BCPS, CPP Clinical Pharmacist (Rheumatology and Pulmonology)

## 2023-03-18 ENCOUNTER — Ambulatory Visit
Admission: RE | Admit: 2023-03-18 | Discharge: 2023-03-18 | Disposition: A | Discharge status: Home / Self Care | Payer: Medicare PPO | Source: Ambulatory Visit | Attending: Family Medicine | Admitting: Family Medicine

## 2023-03-18 DIAGNOSIS — R222 Localized swelling, mass and lump, trunk: Secondary | ICD-10-CM

## 2023-03-18 DIAGNOSIS — D259 Leiomyoma of uterus, unspecified: Secondary | ICD-10-CM | POA: Diagnosis not present

## 2023-03-18 DIAGNOSIS — K5730 Diverticulosis of large intestine without perforation or abscess without bleeding: Secondary | ICD-10-CM | POA: Diagnosis not present

## 2023-03-18 MED ORDER — IOPAMIDOL (ISOVUE-300) INJECTION 61%
500.0000 mL | Freq: Once | INTRAVENOUS | Status: AC | PRN
  Administered 2023-03-18: 100 mL via INTRAVENOUS

## 2023-03-27 DIAGNOSIS — S12090A Other displaced fracture of first cervical vertebra, initial encounter for closed fracture: Secondary | ICD-10-CM | POA: Diagnosis not present

## 2023-03-27 NOTE — Progress Notes (Signed)
 Office Visit Note  Patient: Michele Bryan             Date of Birth: February 11, 1941           MRN: [id number]             PCP: Cleotilde Planas, MD Referring: Cleotilde Planas, MD Visit Date: 04/10/2023 Occupation: @GUAROCC @  Subjective:  Right shoulder pain   History of Present Illness: Michele Bryan is a 83 y.o. female with seropositive rheumatoid arthritis and osteoarthritis.  She states she continues to have increased pain and discomfort in her joints.  She has been experiencing increased pain in her right shoulder.  She states she fell about 10 days ago but did not have any major injuries.  She continues to have pain and stiffness in her bilateral hands and difficulty gripping objects.  She continues to have swelling on her ankles.  She was started on gabapentin  by Dr. Colon and she has been taking 8 tablets a day.  Her last Reclast  infusion was on August 15, 2022.    Activities of Daily Living:  Patient reports morning stiffness for 30 minutes.   Patient Reports nocturnal pain.  Difficulty dressing/grooming: Denies Difficulty climbing stairs: Reports Difficulty getting out of chair: Denies Difficulty using hands for taps, buttons, cutlery, and/or writing: Reports  Review of Systems  Constitutional:  Positive for fatigue.  HENT:  Positive for mouth dryness. Negative for mouth sores.   Eyes:  Negative for dryness.  Respiratory:  Negative for shortness of breath.   Cardiovascular:  Negative for chest pain and palpitations.  Gastrointestinal:  Positive for constipation. Negative for blood in stool and diarrhea.  Endocrine: Positive for increased urination.  Genitourinary:  Positive for involuntary urination.  Musculoskeletal:  Positive for joint pain, gait problem, joint pain, joint swelling, myalgias, morning stiffness, muscle tenderness and myalgias. Negative for muscle weakness.  Skin:  Negative for color change, rash, hair loss and sensitivity to sunlight.  Allergic/Immunologic:  Negative for susceptible to infections.  Neurological:  Negative for dizziness and headaches.  Hematological:  Negative for swollen glands.  Psychiatric/Behavioral:  Positive for sleep disturbance. Negative for depressed mood. The patient is not nervous/anxious.     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   Gait abnormality 03/08/2020   Postoperative pain    Benign essential HTN    Constipation    Anemia of chronic disease    Essential hypertension    Orthostatic hypotension    Cervical myelopathy (HCC) 02/21/2020   Excoriation of ear canal, left, initial encounter 01/17/2020   Impacted cerumen of left ear 01/17/2020   Closed cervical spine fracture (HCC) 12/06/2019   Scapula fracture 03/05/2018   Fracture of acromial process of scapula, closed 02/15/2018   Chronic left shoulder pain 11/25/2017   Left cervical radiculopathy 09/29/2017   S/P shoulder replacement, left 05/15/2017   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  05/06/2016   High risk medication use 05/06/2016   Primary osteoarthritis of both hands 05/06/2016   Primary osteoarthritis of left knee 05/06/2016   Primary osteoarthritis of both feet 05/06/2016   Dyslipidemia 05/06/2016   Peripheral neuropathy 05/06/2016   Diverticulosis of intestine without bleeding 05/06/2016   Osteopenia  05/06/2016   Vitamin D  deficiency 05/06/2016   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  History of GI bleed 05/06/2016   Cataract of both eyes 05/06/2016   Degenerative joint disease of hand 05/06/2016   Osteoarthritis of foot joint 05/06/2016   Urinary urgency 04/30/2015   Chronic constipation 04/30/2015   MS (multiple sclerosis) (HCC) 03/23/2013   Expected blood loss anemia 06/16/2012   S/P right TKA 06/15/2012    Past  Medical History:  Diagnosis Date   Anemia    Arthritis    GERD (gastroesophageal reflux disease)    History of blood transfusion    Hyperlipidemia    Hypertension    Lower extremity edema    MS (multiple sclerosis) (HCC)    Neuromuscular disorder (HCC)    multiple scleroosis/peripheral neuropathy   PONV (postoperative nausea and vomiting)    Rheumatoid arthritis (HCC)    Ulcer    Vitamin D  deficiency     Family History  Problem Relation Age of Onset   Heart attack Father    Breast cancer Neg Hx    Past Surgical History:  Procedure Laterality Date   APPLICATION OF INTRAOPERATIVE CT SCAN N/A 02/16/2020   Procedure: APPLICATION OF INTRAOPERATIVE CT SCAN;  Surgeon: Cheryle Debby LABOR, MD;  Location: [facility] OR;  Service: Neurosurgery;  Laterality: N/A;   AUGMENTATION MAMMAPLASTY     BACK SURGERY     breast augumentation     BREAST SURGERY     biopsy   COLONOSCOPY     DILATION AND CURETTAGE OF UTERUS     EYE SURGERY     both eys,cataracts   goiter     HALO APPLICATION N/A 02/16/2020   Procedure: HALO TRACTION APPLICATION;  Surgeon: Cheryle Debby LABOR, MD;  Location: [facility] OR;  Service: Neurosurgery;  Laterality: N/A;   JOINT REPLACEMENT     KNEE ARTHROSCOPY Right    ORIF SHOULDER FRACTURE Left 03/05/2018   Procedure: OPEN REDUCTION INTERNAL FIXATION (ORIF) LEFT SCAPULA;  Surgeon: Kay Kemps, MD;  Location: [facility] OR;  Service: Orthopedics;  Laterality: Left;   partial toe amputation Right 05/16/2022   2nd toe   POSTERIOR CERVICAL FUSION/FORAMINOTOMY N/A 12/06/2019   Procedure: Cervical one-two Posterior instrumented fusion;  Surgeon: Cheryle Debby LABOR, MD;  Location: [facility] OR;  Service: Neurosurgery;  Laterality: N/A;   POSTERIOR CERVICAL FUSION/FORAMINOTOMY N/A 02/13/2020   Procedure: Revision of cervical instrumented fusion with Occiput to Cervical Four posterior instrumented fusion;  Surgeon: Cheryle Debby LABOR, MD;  Location: [facility] OR;  Service: Neurosurgery;  Laterality: N/A;   posterior   POSTERIOR CERVICAL FUSION/FORAMINOTOMY N/A 02/16/2020   Procedure: Occiput to Thoracic Two Posterior Cervical Fusion with AIRO and Application of Halo;  Surgeon: Cheryle Debby LABOR, MD;  Location: [facility] OR;  Service: Neurosurgery;  Laterality: N/A;   REVERSE SHOULDER ARTHROPLASTY Left 05/15/2017   Procedure: LEFT REVERSE SHOULDER ARTHROPLASTY;  Surgeon: Kay Kemps, MD;  Location: [facility] OR;  Service: Orthopedics;  Laterality: Left;   REVERSE SHOULDER ARTHROPLASTY Left 10/11/2018   Procedure: left shoulder irrigation and debridement, open poly exchange and removal of painful hardware;  Surgeon: Kay Kemps, MD;  Location: [facility] ORS;  Service: Orthopedics;  Laterality: Left;   SHOULDER HEMI-ARTHROPLASTY Left 12/11/2020   Procedure: Left reverse shoulder conversion to SHOULDER HEMI-ARTHROPLASTY;  Surgeon: Kay Kemps, MD;  Location: [facility] ORS;  Service: Orthopedics;  Laterality: Left;  with ISB   THYROID  LOBECTOMY     TOTAL KNEE ARTHROPLASTY Right 06/15/2012   Procedure: RIGHT TOTAL KNEE ARTHROPLASTY;  Surgeon: Donnice JONETTA Car, MD;  Location: [facility] ORS;  Service: Orthopedics;  Laterality: Right;  WRIST SURGERY Left    Social History   Social History Narrative   Patient lives at home with her husband Michele Bryan).   Retired.   Education- college.   Caffeine- Two cups of decaf tea daily.   Right handed.         Immunization History  Administered Date(s) Administered   Influenza, High Dose Seasonal PF 12/03/2013   Influenza,inj,quad, With Preservative 11/23/2014   Influenza-Unspecified 12/19/2017   PFIZER(Purple Top)SARS-COV-2 Vaccination 04/16/2019, 05/29/2019, 01/09/2020   Td 12/15/2017   Td (Adult),5 Lf Tetanus Toxid, Preservative Free 12/15/2017   Zoster Recombinant(Shingrix) 08/10/2017, 01/05/2018   Zoster, Unspecified 08/10/2017, 01/05/2018     Objective: Vital Signs: BP 138/79 (BP Location: Left Arm, Patient Position: Sitting, Cuff Size: Normal)   Pulse 66   Resp 14   Ht 5' 3  (1.6 m)   Wt 118 lb (53.5 kg)   BMI 20.90 kg/m    Physical Exam Vitals and nursing note reviewed.  Constitutional:      Appearance: She is well-developed.  HENT:     Head: Normocephalic and atraumatic.  Eyes:     Conjunctiva/sclera: Conjunctivae normal.  Cardiovascular:     Rate and Rhythm: Normal rate and regular rhythm.     Heart sounds: Normal heart sounds.  Pulmonary:     Effort: Pulmonary effort is normal.     Breath sounds: Normal breath sounds.  Abdominal:     General: Bowel sounds are normal.     Palpations: Abdomen is soft.  Musculoskeletal:     Cervical back: Normal range of motion.  Lymphadenopathy:     Cervical: No cervical adenopathy.  Skin:    General: Skin is warm and dry.     Capillary Refill: Capillary refill takes less than 2 seconds.  Neurological:     Mental Status: She is alert and oriented to person, place, and time.  Psychiatric:        Behavior: Behavior normal.      Musculoskeletal Exam: Patient had very limited range of motion of the cervical spine.  Severe thoracic kyphosis was noted.  There was no point tenderness over the thoracic or lumbar spine.  She had limited painful range of motion of her right shoulder.  Left shoulder joint was in good range of motion.  Elbow joints and wrist joints with good range of motion.  She had severe CMC thickening and subluxation.  Bilateral MCP PIP and DIP thickening was noted.  Mild inflammation was noted in the right third and fourth PIP joints.  Left knee joint was in good range of motion without any warmth swelling or effusion.  Left knee joint was replaced without any warmth swelling or inflammation.  There was no tenderness over ankles or MTPs.  Bilateral pedal edema was noted.  CDAI Exam: CDAI Score: 14  Patient Global: 60 / 100; Provider Global: 40 / 100 Swollen: 2 ; Tender: 2  Joint Exam 04/10/2023      Right  Left  PIP 3 (finger)  Swollen Tender     PIP 4 (finger)  Swollen Tender         Investigation: No additional findings.  Imaging: CT ABDOMEN PELVIS W CONTRAST Result Date: 03/26/2023 CLINICAL DATA:  Abdominal pain. EXAM: CT ABDOMEN AND PELVIS WITH CONTRAST TECHNIQUE: Multidetector CT imaging of the abdomen and pelvis was performed using the standard protocol following bolus administration of intravenous contrast. RADIATION DOSE REDUCTION: This exam was performed according to the departmental dose-optimization program which includes automated exposure control,  adjustment of the mA and/or kV according to patient size and/or use of iterative reconstruction technique. CONTRAST:  100mL ISOVUE -300 IOPAMIDOL  (ISOVUE -300) INJECTION 61% COMPARISON:  04/15/2019 FINDINGS: Lower Chest: No acute findings. Hepatobiliary: No suspicious hepatic masses identified. Gallbladder is nondistended but otherwise unremarkable. No evidence of biliary ductal dilatation. Pancreas:  No mass or inflammatory changes. Spleen: Within normal limits in size and appearance. Adrenals/Urinary Tract: No suspicious masses identified. No evidence of ureteral calculi or hydronephrosis. Stomach/Bowel: No evidence of obstruction, inflammatory process or abnormal fluid collections. Diverticulosis is seen mainly involving the descending and sigmoid colon, however there is no evidence of diverticulitis. Vascular/Lymphatic: No pathologically enlarged lymph nodes. No acute vascular findings. Reproductive: A few tiny uterine fibroids with punctate calcifications are again noted. Adnexal regions are unremarkable. Other:  None. Musculoskeletal: No suspicious bone lesions identified. Advanced lumbar spine degenerative changes are seen, with compression fracture of the L2 vertebral body which appears chronic although new since 2021. IMPRESSION: No acute findings. Colonic diverticulosis, without radiographic evidence of diverticulitis. Tiny uterine fibroids. Severe lumbar spine degenerative changes. L2 compression fracture appears  chronic, although is new since 2021 exam. Electronically Signed   By: Norleen DELENA Kil M.D.   On: 03/26/2023 10:06    Recent Labs: Lab Results  Component Value Date   WBC 4.7 12/31/2022   HGB 11.0 (L) 12/31/2022   PLT 251 12/31/2022   NA 142 12/31/2022   K 4.2 12/31/2022   CL 107 12/31/2022   CO2 28 12/31/2022   GLUCOSE 80 12/31/2022   BUN 26 (H) 12/31/2022   CREATININE 0.83 12/31/2022   BILITOT 1.1 12/31/2022   ALKPHOS 62 10/23/2021   AST 20 12/31/2022   ALT 14 12/31/2022   PROT 6.6 12/31/2022   ALBUMIN  4.6 10/23/2021   CALCIUM  9.9 12/31/2022   GFRAA 98 04/03/2020   QFTBGOLDPLUS NEGATIVE 02/18/2022    Speciality Comments: PLQ Eye Exam: 11/25/2022 WNL @ [facility]. Follow up in 1 year  Osteoporosis managed by PCP-ACY 10/12/2018 Rinvoq -diarrhea, Xeljanz -12/19/20 Reclast  August 15, 2022  Procedures:  No procedures performed Allergies: Meperidine  hcl, Codeine, and Demerol  [meperidine ]   Assessment / Plan:     Visit Diagnoses: Rheumatoid arthritis involving multiple sites with positive rheumatoid factor (HCC) - Patient has severe rheumatoid arthritis and osteoarthritis overlap.  She continues to have pain and discomfort in multiple joints.  Synovitis was noted over some of the PIP joints.  She is on the combination of Olumiant  and Plaquenil  but she is tolerating well.  High risk medication use - Olumiant  2 mg by mouth daily, plaquenil  200 mg 1 tablet by mouth daily.  Plaquenil  eye examination November 25, 2022.  Labs are due today.  Will obtain labs today.  Previous therapy includes Xeljanz , Rinvoq , Orencia , and methotrexate  (D/c due to SOB). - Plan: CBC with Differential/Platelet, COMPLETE METABOLIC PANEL WITH GFR, QuantiFERON-TB Gold Plus.  I will check lipid panel.  Information minimization was placed in the AVS.  She was advised to hold Olumiant  if she develops an infection resume after the infection resolves.  FDA blackbox warning associated with living to use was  reviewed including the increased risk of major adverse cardiovascular events, thrombosis, serious infection, lymphoma, skin cancer, stroke, pulmonary embolism.  Annual skin examination was advised to screen for skin cancer.  Use of sunscreen and sun protection was advised.  Chronic right shoulder pain-she continues to have discomfort in her right shoulder joint.  She has an appointment coming up with Dr. Mariea in 2 weeks.  H/O total shoulder replacement, left - Revision x2 performed by Dr. Mariea.  Status post right knee replacement-doing well.  Primary osteoarthritis of both hands-she continues to have pain and stiffness in her bilateral hands.  She has rheumatoid arthritis and osteoarthritis already had with some inflammation in her PIP joints.  Primary osteoarthritis of left knee-she denies discomfort today.  Primary osteoarthritis of both feet -she had bilateral pedal edema most likely related to gabapentin  use.  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 Silva.  Closed fracture of cervical vertebra, unspecified cervical vertebral level, sequela -she has severely limited range of motion of the cervical spine.  Fall in January 2020-she fractured C1 and C2 with displacement requiring fusion on 12/06/2019  Osteopenia of multiple sites - 07/10/2021 DEXA BMD 0.728, T score -2.2, right femoral neck. Previously on fosamax  by PCP-Developed vertebral fracture.Completed 2 years of Forteo .  She had first Reclast  infusion on August 15, 2022.  We plan to give repeat Reclast  infusion in 2025.  May consider getting DEXA scan in 2026.  Medication monitoring encounter - Complete Forteo -initiated on 04/03/20. Transitioned to IV reclast --08/15/22.  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  Vitamin D  deficiency-she is on vitamin D  supplement.  Other medical problems are listed as follows:  History of diverticulosis  History of GI bleed  History of cataract  History of  peripheral neuropathy  Dyslipidemia -I will check lipid panel today.  Plan: Lipid panel  History of multiple sclerosis (HCC)  Orders: Orders Placed This Encounter  Procedures   CBC with Differential/Platelet   COMPLETE METABOLIC PANEL WITH GFR   Lipid panel   QuantiFERON-TB Gold Plus   No orders of the defined types were placed in this encounter.    Follow-Up Instructions: Return in about 4 months (around 08/08/2023) for Rheumatoid arthritis, Osteoarthritis.   Maya Nash, MD  Note - This record has been created using Animal nutritionist.  Chart creation errors have been sought, but may not always  have been located. Such creation errors do not reflect on  the standard of medical care.

## 2023-03-30 ENCOUNTER — Other Ambulatory Visit: Payer: Self-pay | Admitting: Neurological Surgery

## 2023-03-30 DIAGNOSIS — S12090S Other displaced fracture of first cervical vertebra, sequela: Secondary | ICD-10-CM

## 2023-04-01 ENCOUNTER — Encounter: Payer: Self-pay | Admitting: Neurological Surgery

## 2023-04-07 ENCOUNTER — Ambulatory Visit
Admission: RE | Admit: 2023-04-07 | Discharge: 2023-04-07 | Disposition: A | Discharge status: Home / Self Care | Payer: Medicare PPO | Source: Ambulatory Visit | Attending: Neurological Surgery | Admitting: Neurological Surgery

## 2023-04-07 DIAGNOSIS — M542 Cervicalgia: Secondary | ICD-10-CM | POA: Diagnosis not present

## 2023-04-07 DIAGNOSIS — Z981 Arthrodesis status: Secondary | ICD-10-CM | POA: Diagnosis not present

## 2023-04-07 DIAGNOSIS — S12090S Other displaced fracture of first cervical vertebra, sequela: Secondary | ICD-10-CM

## 2023-04-10 ENCOUNTER — Encounter: Payer: Self-pay | Admitting: Rheumatology

## 2023-04-10 ENCOUNTER — Ambulatory Visit: Payer: Medicare PPO | Attending: Rheumatology | Admitting: Rheumatology

## 2023-04-10 VITALS — BP 138/79 | HR 66 | Resp 14 | Ht 63.0 in | Wt 118.0 lb

## 2023-04-10 DIAGNOSIS — Z79899 Other long term (current) drug therapy: Secondary | ICD-10-CM | POA: Diagnosis not present

## 2023-04-10 DIAGNOSIS — M19071 Primary osteoarthritis, right ankle and foot: Secondary | ICD-10-CM | POA: Diagnosis not present

## 2023-04-10 DIAGNOSIS — Z5181 Encounter for therapeutic drug level monitoring: Secondary | ICD-10-CM

## 2023-04-10 DIAGNOSIS — Z96651 Presence of right artificial knee joint: Secondary | ICD-10-CM | POA: Diagnosis not present

## 2023-04-10 DIAGNOSIS — E559 Vitamin D deficiency, unspecified: Secondary | ICD-10-CM

## 2023-04-10 DIAGNOSIS — Z96612 Presence of left artificial shoulder joint: Secondary | ICD-10-CM

## 2023-04-10 DIAGNOSIS — M0579 Rheumatoid arthritis with rheumatoid factor of multiple sites without organ or systems involvement: Secondary | ICD-10-CM | POA: Diagnosis not present

## 2023-04-10 DIAGNOSIS — M1712 Unilateral primary osteoarthritis, left knee: Secondary | ICD-10-CM | POA: Diagnosis not present

## 2023-04-10 DIAGNOSIS — G35 Multiple sclerosis: Secondary | ICD-10-CM

## 2023-04-10 DIAGNOSIS — Z8719 Personal history of other diseases of the digestive system: Secondary | ICD-10-CM

## 2023-04-10 DIAGNOSIS — E785 Hyperlipidemia, unspecified: Secondary | ICD-10-CM | POA: Diagnosis not present

## 2023-04-10 DIAGNOSIS — M19041 Primary osteoarthritis, right hand: Secondary | ICD-10-CM

## 2023-04-10 DIAGNOSIS — S129XXS Fracture of neck, unspecified, sequela: Secondary | ICD-10-CM | POA: Diagnosis not present

## 2023-04-10 DIAGNOSIS — M19042 Primary osteoarthritis, left hand: Secondary | ICD-10-CM

## 2023-04-10 DIAGNOSIS — M19072 Primary osteoarthritis, left ankle and foot: Secondary | ICD-10-CM

## 2023-04-10 DIAGNOSIS — M8589 Other specified disorders of bone density and structure, multiple sites: Secondary | ICD-10-CM

## 2023-04-10 DIAGNOSIS — M25511 Pain in right shoulder: Secondary | ICD-10-CM

## 2023-04-10 DIAGNOSIS — G8929 Other chronic pain: Secondary | ICD-10-CM

## 2023-04-10 DIAGNOSIS — M4004 Postural kyphosis, thoracic region: Secondary | ICD-10-CM

## 2023-04-10 DIAGNOSIS — Z8669 Personal history of other diseases of the nervous system and sense organs: Secondary | ICD-10-CM

## 2023-04-10 NOTE — Patient Instructions (Addendum)
 Standing Labs We placed an order today for your standing lab work.   Please have your standing labs drawn in May and every 3 months  Please have your labs drawn 2 weeks prior to your appointment so that the provider can discuss your lab results at your appointment, if possible.  Please note that you may see your imaging and lab results in MyChart before we have reviewed them. We will contact you once all results are reviewed. Please allow our office up to 72 hours to thoroughly review all of the results before contacting the office for clarification of your results.  WALK-IN LAB HOURS  Monday through Thursday from 8:00 am -12:30 pm and 1:00 pm-5:00 pm and Friday from 8:00 am-12:00 pm.  Patients with office visits requiring labs will be seen before walk-in labs.  You may encounter longer than normal wait times. Please allow additional time. Wait times may be shorter on  Monday and Thursday afternoons.  We do not book appointments for walk-in labs. We appreciate your patience and understanding with our staff.   Labs are drawn by [facility]. Please bring your co-pay at the time of your lab draw.  You may receive a bill from [facility] for your lab work.  Please note if you are on Hydroxychloroquine  and and an order has been placed for a Hydroxychloroquine  level,  you will need to have it drawn 4 hours or more after your last dose.  If you wish to have your labs drawn at another location, please call the office 24 hours in advance so we can fax the orders.  The office is located at [address], [place], [place] [address]   If you have any questions regarding directions or hours of operation,  please call [phone].   As a reminder, please drink plenty of water  prior to coming for your lab work. Thanks!   Vaccines You are taking a medication(s) that can suppress your immune system.  The following immunizations are recommended: Flu annually Covid-19  RSV Td/Tdap (tetanus,  diphtheria, pertussis) every 10 years Pneumonia (Prevnar 15 then Pneumovax 23 at least 1 year apart.  Alternatively, can take Prevnar 20 without needing additional dose) Shingrix: 2 doses from 4 weeks to 6 months apart  Please check with your PCP to make sure you are up to date.  If you have signs or symptoms of an infection or start antibiotics: First, call your PCP for workup of your infection. Hold your medication through the infection, until you complete your antibiotics, and until symptoms resolve if you take the following: Injectable medication (Actemra, Benlysta, Cimzia, Cosentyx, Enbrel, Humira, Kevzara, Orencia , Remicade, Simponi, Stelara, Taltz, Tremfya) Methotrexate  Leflunomide (Arava) Mycophenolate (Cellcept) Xeljanz , Olumiant , or Rinvoq    Because you are taking Xeljanz , Rinvoq , or Olumiant , it is very important to know that this class of medications has a FDA BLACK BOX WARNING for major adverse cardiovascular events (MACE), thrombosis, mortality (including sudden cardiovascular death), serious infections, and lymphomas. MACE is defined as cardiovascular death, myocardial infarction, and stroke. Thrombosis includes deep venous thrombosis (DVT), pulmonary embolism (PE), and arterial thrombosis. If you are a current or former smoker, you are at higher risk for MACE.  Please get annual skin examination to screen for skin cancer.  Please use sunscreen and sun protection.

## 2023-04-14 LAB — COMPLETE METABOLIC PANEL WITH GFR
AG Ratio: 2.1 (calc) (ref 1.0–2.5)
ALT: 15 U/L (ref 6–29)
AST: 22 U/L (ref 10–35)
Albumin: 4.8 g/dL (ref 3.6–5.1)
Alkaline phosphatase (APISO): 47 U/L (ref 37–153)
BUN: 24 mg/dL (ref 7–25)
CO2: 26 mmol/L (ref 20–32)
Calcium: 10.3 mg/dL (ref 8.6–10.4)
Chloride: 108 mmol/L (ref 98–110)
Creat: 0.87 mg/dL (ref 0.60–0.95)
Globulin: 2.3 g/dL (ref 1.9–3.7)
Glucose, Bld: 89 mg/dL (ref 65–99)
Potassium: 4.4 mmol/L (ref 3.5–5.3)
Sodium: 143 mmol/L (ref 135–146)
Total Bilirubin: 0.8 mg/dL (ref 0.2–1.2)
Total Protein: 7.1 g/dL (ref 6.1–8.1)
eGFR: 67 mL/min/{1.73_m2} (ref >=60)

## 2023-04-14 LAB — CBC WITH DIFFERENTIAL/PLATELET
Absolute Lymphocytes: 1487 {cells}/uL (ref 850–3900)
Absolute Monocytes: 710 {cells}/uL (ref 200–950)
Basophils Absolute: 40 {cells}/uL (ref 0–200)
Basophils Relative: 0.6 %
Eosinophils Absolute: 7 {cells}/uL — ABNORMAL LOW (ref 15–500)
Eosinophils Relative: 0.1 %
HCT: 33.3 % — ABNORMAL LOW (ref 35.0–45.0)
Hemoglobin: 10.7 g/dL — ABNORMAL LOW (ref 11.7–15.5)
MCH: 30.9 pg (ref 27.0–33.0)
MCHC: 32.1 g/dL (ref 32.0–36.0)
MCV: 96.2 fL (ref 80.0–100.0)
MPV: 9.5 fL (ref 7.5–12.5)
Monocytes Relative: 10.6 %
Neutro Abs: 4456 {cells}/uL (ref 1500–7800)
Neutrophils Relative %: 66.5 %
Platelets: 320 10*3/uL (ref 140–400)
RBC: 3.46 10*6/uL — ABNORMAL LOW (ref 3.80–5.10)
RDW: 12.6 % (ref 11.0–15.0)
Total Lymphocyte: 22.2 %
WBC: 6.7 10*3/uL (ref 3.8–10.8)

## 2023-04-14 LAB — LIPID PANEL
Cholesterol: 193 mg/dL (ref <200)
HDL: 101 mg/dL (ref >=50)
LDL Cholesterol (Calc): 79 mg/dL
Non-HDL Cholesterol (Calc): 92 mg/dL (ref <130)
Total CHOL/HDL Ratio: 1.9 (calc) (ref <5.0)
Triglycerides: 47 mg/dL (ref <150)

## 2023-04-14 LAB — QUANTIFERON-TB GOLD PLUS
Mitogen-NIL: >10 [IU]/mL
NIL: 0.04 [IU]/mL
QuantiFERON-TB Gold Plus: NEGATIVE
TB1-NIL: 0 [IU]/mL
TB2-NIL: <0 [IU]/mL

## 2023-04-14 NOTE — Progress Notes (Signed)
TB Gold is negative, lipid panel is normal, CMP is normal, CBC shows low hemoglobin at 10.7.  Patient should take multivitamin with iron.  Please forward results to her PCP for the evaluation of anemia.

## 2023-04-23 DIAGNOSIS — M19011 Primary osteoarthritis, right shoulder: Secondary | ICD-10-CM | POA: Diagnosis not present

## 2023-04-24 DIAGNOSIS — M419 Scoliosis, unspecified: Secondary | ICD-10-CM | POA: Diagnosis not present

## 2023-05-05 ENCOUNTER — Other Ambulatory Visit: Payer: Self-pay

## 2023-05-05 ENCOUNTER — Other Ambulatory Visit: Payer: Self-pay | Admitting: *Deleted

## 2023-05-05 DIAGNOSIS — Z79899 Other long term (current) drug therapy: Secondary | ICD-10-CM

## 2023-05-05 DIAGNOSIS — M0579 Rheumatoid arthritis with rheumatoid factor of multiple sites without organ or systems involvement: Secondary | ICD-10-CM

## 2023-05-05 MED ORDER — BARICITINIB 2 MG PO TABS: 2.0000 mg | ORAL_TABLET | Freq: Every day | ORAL | 0 refills | Status: DC

## 2023-05-05 NOTE — Telephone Encounter (Signed)
 Patient's husband contacted the office and requested refill on Olumiant.  Last Fill: 09/30/2022  Labs: 04/10/2023 lipid panel is normal, CMP is normal, CBC shows low hemoglobin at 10.7.   TB Gold: 04/10/2023 Neg    Next Visit: 08/10/2023  Last Visit: 04/10/2023  [id number] arthritis involving multiple sites with positive rheumatoid factor   Current Dose per office note 04/10/2023: Olumiant 2 mg by mouth daily  Okay to refill Olumiant?

## 2023-05-11 ENCOUNTER — Other Ambulatory Visit: Payer: Self-pay | Admitting: Physician Assistant

## 2023-05-11 DIAGNOSIS — Z79899 Other long term (current) drug therapy: Secondary | ICD-10-CM

## 2023-05-11 DIAGNOSIS — M0579 Rheumatoid arthritis with rheumatoid factor of multiple sites without organ or systems involvement: Secondary | ICD-10-CM

## 2023-05-18 ENCOUNTER — Telehealth: Payer: Self-pay

## 2023-05-18 NOTE — Telephone Encounter (Signed)
 Patient and the patient's Husband contacted the office and states they were advised by their pharmacy that our office had to send in an application before the patient's Olumiant could be processed. Inquired what application they were talking about. Patient's husband states it is an application for free or discounted medication. Advised the patient and her husband I would send a message to the pharmacy team. Patient's husband inquired if there was a sample that the patient could use for now considering she has been off the medication for a few weeks. The patient and patient's husband states if any other information is needed, do not hesitate to call. Please advise.

## 2023-05-20 ENCOUNTER — Telehealth: Payer: Self-pay | Admitting: Pharmacist

## 2023-05-20 DIAGNOSIS — M0579 Rheumatoid arthritis with rheumatoid factor of multiple sites without organ or systems involvement: Secondary | ICD-10-CM

## 2023-05-20 DIAGNOSIS — Z79899 Other long term (current) drug therapy: Secondary | ICD-10-CM

## 2023-05-20 MED ORDER — BARICITINIB 2 MG PO TABS
2.0000 mg | ORAL_TABLET | Freq: Every day | ORAL | 0 refills | Status: DC
  Filled 2023-05-22: qty 30, 30d supply, fill #0
  Filled 2023-06-11: qty 30, 30d supply, fill #1

## 2023-05-20 NOTE — Telephone Encounter (Signed)
 We do not have any Olumiant samples unfortunately. Advised husband. They will stop by clinic to sign forms  Chesley Mires, PharmD, MPH, BCPS, CPP Clinical Pharmacist (Rheumatology and Pulmonology)

## 2023-05-20 NOTE — Telephone Encounter (Signed)
 Patient due for Olumiant PAP renewal through [name].  Prescriber forms completed. PA completed nad attached with med list and insurance card copy.  Pending patient portion to be completed. Placed in file cabinet up front

## 2023-05-20 NOTE — Telephone Encounter (Signed)
 Patient and husband stopped by clinic. The income that they wrote on application is above income threshold for Olumiant which falls into Group 3 number. They confirmed that their income has increased.  They are willing to pay $100 for Olumiant through [facility]. Rx sent  to [facility]. Will need to be onboarded  Chesley Mires, PharmD, MPH, BCPS, CPP Clinical Pharmacist (Rheumatology and Pulmonology)

## 2023-05-22 ENCOUNTER — Other Ambulatory Visit (HOSPITAL_COMMUNITY): Payer: Self-pay

## 2023-05-22 ENCOUNTER — Telehealth: Payer: Self-pay

## 2023-05-22 ENCOUNTER — Other Ambulatory Visit: Payer: Self-pay

## 2023-05-22 NOTE — Progress Notes (Signed)
 No longer eligible for patient assistance program. They will pay $100 per month for Olumiant  Dose : 2mg  once daily  Continue with hydroxychloroquine 200mg  daily  Chesley Mires, PharmD, MPH, BCPS, CPP Clinical Pharmacist (Rheumatology and Pulmonology)

## 2023-05-22 NOTE — Progress Notes (Signed)
 Specialty Pharmacy Initial Fill Coordination Note  Michele Bryan is a 82 y.o. female contacted today regarding initial fill of specialty medication(s) Baricitinib [name])   Patient requested Delivery   Delivery date: 05/26/23   Verified address: [address]   [place] [address]   Medication will be filled on 05/25/23.   Patient is aware of $100 copayment. Payment info has been collected and forwarded to [place] at [facility].

## 2023-05-22 NOTE — Telephone Encounter (Signed)
 Attempted to run test claim while setting up shipment, received a rejection stating that a PA is needed. This is despite the PA having been renewed on 02/26/2023 and was supposedly good until 03/02/2024.  Submitted an URGENT Prior Authorization request to [facility] for OLUMIANT via CoverMyMeds. Will update once we receive a response.  Key: [id number]

## 2023-05-22 NOTE — Telephone Encounter (Signed)
 Received notification from [facility] regarding a prior authorization for OLUMIANT. Authorization has been APPROVED from 03/04/2023 to 03/02/2024. Approval letter sent to scan center.  Authorization # [id number]

## 2023-05-25 ENCOUNTER — Other Ambulatory Visit (HOSPITAL_COMMUNITY): Payer: Self-pay

## 2023-05-25 ENCOUNTER — Other Ambulatory Visit: Payer: Self-pay

## 2023-06-10 ENCOUNTER — Other Ambulatory Visit: Payer: Self-pay

## 2023-06-11 ENCOUNTER — Other Ambulatory Visit: Payer: Self-pay

## 2023-06-11 NOTE — Progress Notes (Signed)
 Specialty Pharmacy Refill Coordination Note  Michele Bryan is a 83 y.o. female contacted today regarding refills of specialty medication(s) Baricitinib [name])   Patient requested (Patient-Rptd) Pickup at [facility] at [facility] date: 06/18/23   Medication will be filled on 06/17/23.    Patient had requested pick up on 4/14, but insurance will not pay until 4/16. Sent MyChart message that medication would be available for pick up 4/17.

## 2023-06-17 ENCOUNTER — Other Ambulatory Visit: Payer: Self-pay

## 2023-07-10 ENCOUNTER — Encounter: Payer: Self-pay | Admitting: *Deleted

## 2023-07-14 ENCOUNTER — Other Ambulatory Visit: Payer: Self-pay

## 2023-07-14 ENCOUNTER — Other Ambulatory Visit: Payer: Self-pay | Admitting: Rheumatology

## 2023-07-14 DIAGNOSIS — M0579 Rheumatoid arthritis with rheumatoid factor of multiple sites without organ or systems involvement: Secondary | ICD-10-CM

## 2023-07-14 DIAGNOSIS — Z79899 Other long term (current) drug therapy: Secondary | ICD-10-CM

## 2023-07-14 NOTE — Progress Notes (Signed)
 Specialty Pharmacy Refill Coordination Note  Michele Bryan is a 83 y.o. female contacted today regarding refills of specialty medication(s) Baricitinib  (OLUMIANT )   Patient requested (Patient-Rptd) Delivery   Delivery date: (Patient-Rptd) 07/21/23   Verified address: (Patient-Rptd) [address] [place], [place], [place] [address]   Medication will be filled on 05.19.25.   Sent refill request, notify patient if delayed.

## 2023-07-15 ENCOUNTER — Other Ambulatory Visit (HOSPITAL_COMMUNITY): Payer: Self-pay

## 2023-07-15 ENCOUNTER — Other Ambulatory Visit: Payer: Self-pay

## 2023-07-15 DIAGNOSIS — M19011 Primary osteoarthritis, right shoulder: Secondary | ICD-10-CM | POA: Diagnosis not present

## 2023-07-15 DIAGNOSIS — I10 Essential (primary) hypertension: Secondary | ICD-10-CM | POA: Diagnosis not present

## 2023-07-15 DIAGNOSIS — Z01810 Encounter for preprocedural cardiovascular examination: Secondary | ICD-10-CM | POA: Diagnosis not present

## 2023-07-15 DIAGNOSIS — M069 Rheumatoid arthritis, unspecified: Secondary | ICD-10-CM | POA: Diagnosis not present

## 2023-07-15 DIAGNOSIS — G4700 Insomnia, unspecified: Secondary | ICD-10-CM | POA: Diagnosis not present

## 2023-07-15 MED ORDER — BARICITINIB 2 MG PO TABS
2.0000 mg | ORAL_TABLET | Freq: Every day | ORAL | 0 refills | Status: DC
  Filled 2023-07-15 (×2): qty 30, 30d supply, fill #0
  Filled 2023-08-14: qty 30, 30d supply, fill #1

## 2023-07-15 NOTE — Telephone Encounter (Signed)
 Last Fill: 05/20/2023 (30 day supply)  Labs: 04/10/2023 lipid panel is normal, CMP is normal, CBC shows low hemoglobin at 10.7.   TB Gold: 04/10/2023 Neg    Next Visit: 08/10/2023  Last Visit: 04/10/2023  DX: Rheumatoid arthritis involving multiple sites with positive rheumatoid factor   Current Dose per office note 04/10/2023: Olumiant  2 mg by mouth daily   Patient advised she is due to update labs. Patient states she will talk to her husband and see when he can bring her in.   Okay to refill Olumiant ?

## 2023-07-20 ENCOUNTER — Other Ambulatory Visit: Payer: Self-pay

## 2023-07-28 NOTE — Progress Notes (Signed)
 Office Visit Note  Patient: Michele Bryan             Date of Birth: 1940-05-27           MRN: [id number]             PCP: Perley Bradley, MD Referring: Perley Bradley, MD Visit Date: 08/10/2023 Occupation: @GUAROCC @  Subjective:  Right shoulder pain   History of Present Illness: Michele Bryan is a 83 y.o. female with history of rheumatoid arthritis, DDD, and osteoporosis.  Patient remains on Olumiant  2 mg by mouth daily.  She is tolerating Ilumya without any side effects and has not had any recent gaps in therapy.  Patient reports that she has been off of Plaquenil  since her last office visit in February 2025 with Dr. Alvira Josephs.  Patient is unsure of the reason that Plaquenil  was discontinued.  She continues to have chronic pain in multiple joints.  Her pain has been most severe in the right shoulder, both hands, and both feet.  Patient is scheduled for a right reverse total shoulder replacement 10/09/23 which will be performed by Dr. Brunilda Capra.  Patient states that she is on the cancellation list due to the severity of pain she is experiencing.  Patient has been taking Tylenol  and tramadol  for pain relief. She denies any recent or recurrent infections.    Activities of Daily Living:  Patient reports morning stiffness for 1 hour.   Patient Reports nocturnal pain.  Difficulty dressing/grooming: Denies Difficulty climbing stairs: Reports Difficulty getting out of chair: Reports Difficulty using hands for taps, buttons, cutlery, and/or writing: Reports  Review of Systems  Constitutional:  Positive for fatigue.  HENT:  Positive for mouth dryness. Negative for mouth sores.   Eyes:  Negative for dryness.  Respiratory:  Negative for shortness of breath.   Cardiovascular:  Negative for chest pain and palpitations.  Gastrointestinal:  Negative for blood in stool, constipation and diarrhea.  Endocrine: Positive for increased urination.  Genitourinary:  Negative for involuntary urination.   Musculoskeletal:  Positive for joint pain, gait problem, joint pain, joint swelling, myalgias, muscle weakness, morning stiffness, muscle tenderness and myalgias.  Skin:  Positive for rash. Negative for color change, hair loss and sensitivity to sunlight.  Allergic/Immunologic: Negative for susceptible to infections.  Neurological:  Negative for dizziness and headaches.  Hematological:  Negative for swollen glands.  Psychiatric/Behavioral:  Positive for sleep disturbance. Negative for depressed mood. The patient is not nervous/anxious.     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   Gait abnormality 03/08/2020   Postoperative pain    Benign essential HTN    Constipation    Anemia of chronic disease    Essential hypertension    Orthostatic hypotension    Cervical myelopathy (HCC) 02/21/2020   Excoriation of ear canal, left, initial encounter 01/17/2020   Impacted cerumen of left ear 01/17/2020   Closed cervical spine fracture (HCC) 12/06/2019   Scapula fracture 03/05/2018   Fracture of acromial process of scapula, closed 02/15/2018   Chronic left shoulder pain 11/25/2017   Left cervical radiculopathy 09/29/2017   S/P shoulder replacement, left 05/15/2017   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  05/06/2016   High risk medication use 05/06/2016   Primary osteoarthritis of both hands 05/06/2016   Primary osteoarthritis of left  knee 05/06/2016   Primary osteoarthritis of both feet 05/06/2016   Dyslipidemia 05/06/2016   Peripheral neuropathy 05/06/2016   Diverticulosis of intestine without bleeding 05/06/2016   Osteopenia  05/06/2016   Vitamin D  deficiency 05/06/2016   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   History of GI bleed 05/06/2016   Cataract of both eyes  05/06/2016   Degenerative joint disease of hand 05/06/2016   Osteoarthritis of foot joint 05/06/2016   Urinary urgency 04/30/2015   Chronic constipation 04/30/2015   MS (multiple sclerosis) (HCC) 03/23/2013   Expected blood loss anemia 06/16/2012   S/P right TKA 06/15/2012    Past Medical History:  Diagnosis Date   Anemia    Arthritis    GERD (gastroesophageal reflux disease)    History of blood transfusion    Hyperlipidemia    Hypertension    Lower extremity edema    MS (multiple sclerosis) (HCC)    Neuromuscular disorder (HCC)    multiple scleroosis/peripheral neuropathy   PONV (postoperative nausea and vomiting)    Rheumatoid arthritis (HCC)    Ulcer    Vitamin D  deficiency     Family History  Problem Relation Age of Onset   Heart attack Father    Breast cancer Neg Hx    Past Surgical History:  Procedure Laterality Date   APPLICATION OF INTRAOPERATIVE CT SCAN N/A 02/16/2020   Procedure: APPLICATION OF INTRAOPERATIVE CT SCAN;  Surgeon: Cannon Champion, MD;  Location: [facility] OR;  Service: Neurosurgery;  Laterality: N/A;   AUGMENTATION MAMMAPLASTY     BACK SURGERY     breast augumentation     BREAST SURGERY     biopsy   COLONOSCOPY     DILATION AND CURETTAGE OF UTERUS     EYE SURGERY     both eys,cataracts   goiter     HALO APPLICATION N/A 02/16/2020   Procedure: HALO TRACTION APPLICATION;  Surgeon: Cannon Champion, MD;  Location: [facility] OR;  Service: Neurosurgery;  Laterality: N/A;   JOINT REPLACEMENT     KNEE ARTHROSCOPY Right    ORIF SHOULDER FRACTURE Left 03/05/2018   Procedure: OPEN REDUCTION INTERNAL FIXATION (ORIF) LEFT SCAPULA;  Surgeon: Winston Hawking, MD;  Location: [facility] OR;  Service: Orthopedics;  Laterality: Left;   partial toe amputation Right 05/16/2022   2nd toe   POSTERIOR CERVICAL FUSION/FORAMINOTOMY N/A 12/06/2019   Procedure: Cervical one-two Posterior instrumented fusion;  Surgeon: Cannon Champion, MD;  Location: [facility] OR;  Service:  Neurosurgery;  Laterality: N/A;   POSTERIOR CERVICAL FUSION/FORAMINOTOMY N/A 02/13/2020   Procedure: Revision of cervical instrumented fusion with Occiput to Cervical Four posterior instrumented fusion;  Surgeon: Cannon Champion, MD;  Location: [facility] OR;  Service: Neurosurgery;  Laterality: N/A;  posterior   POSTERIOR CERVICAL FUSION/FORAMINOTOMY N/A 02/16/2020   Procedure: Occiput to Thoracic Two Posterior Cervical Fusion with AIRO and Application of Halo;  Surgeon: Cannon Champion, MD;  Location: [facility] OR;  Service: Neurosurgery;  Laterality: N/A;   REVERSE SHOULDER ARTHROPLASTY Left 05/15/2017   Procedure: LEFT REVERSE SHOULDER ARTHROPLASTY;  Surgeon: Winston Hawking, MD;  Location: [facility] OR;  Service: Orthopedics;  Laterality: Left;   REVERSE SHOULDER ARTHROPLASTY Left 10/11/2018   Procedure: left shoulder irrigation and debridement, open poly exchange and removal of painful hardware;  Surgeon: Winston Hawking, MD;  Location: [facility] ORS;  Service: Orthopedics;  Laterality: Left;   SHOULDER HEMI-ARTHROPLASTY Left 12/11/2020   Procedure: Left reverse shoulder conversion to SHOULDER HEMI-ARTHROPLASTY;  Surgeon: Winston Hawking, MD;  Location:  [facility] ORS;  Service: Orthopedics;  Laterality: Left;  with ISB   THYROID  LOBECTOMY     TOTAL KNEE ARTHROPLASTY Right 06/15/2012   Procedure: RIGHT TOTAL KNEE ARTHROPLASTY;  Surgeon: Bevin Bucks, MD;  Location: [facility] ORS;  Service: Orthopedics;  Laterality: Right;   WRIST SURGERY Left    Social History   Social History Narrative   Patient lives at home with her husband Gorman Laughter).   Retired.   Education- college.   Caffeine- Two cups of decaf tea daily.   Right handed.         Immunization History  Administered Date(s) Administered   Influenza, High Dose Seasonal PF 12/03/2013   Influenza,inj,quad, With Preservative 11/23/2014   Influenza-Unspecified 12/19/2017   PFIZER(Purple Top)SARS-COV-2 Vaccination 04/16/2019, 05/29/2019, 01/09/2020   Td 12/15/2017   Td  (Adult),5 Lf Tetanus Toxid, Preservative Free 12/15/2017   Zoster Recombinant(Shingrix) 08/10/2017, 01/05/2018   Zoster, Unspecified 08/10/2017, 01/05/2018     Objective: Vital Signs: BP (!) 149/76 (BP Location: Left Arm, Patient Position: Sitting)   Pulse (!) 58   Resp 16   Ht 5\' 3"  (1.6 m)   Wt 116 lb (52.6 kg)   BMI 20.55 kg/m    Physical Exam Vitals and nursing note reviewed.  Constitutional:      Appearance: She is well-developed.  HENT:     Head: Normocephalic and atraumatic.  Eyes:     Conjunctiva/sclera: Conjunctivae normal.  Cardiovascular:     Rate and Rhythm: Normal rate and regular rhythm.     Heart sounds: Normal heart sounds.  Pulmonary:     Effort: Pulmonary effort is normal.     Breath sounds: Normal breath sounds.  Abdominal:     General: Bowel sounds are normal.     Palpations: Abdomen is soft.  Musculoskeletal:     Cervical back: Normal range of motion.  Lymphadenopathy:     Cervical: No cervical adenopathy.  Skin:    General: Skin is warm and dry.     Capillary Refill: Capillary refill takes less than 2 seconds.  Neurological:     Mental Status: She is alert and oriented to person, place, and time.  Psychiatric:        Behavior: Behavior normal.      Musculoskeletal Exam: Severely limited range of motion of the cervical spine.  Thoracic kyphosis noted.  Painful limited range of motion of both shoulders especially the right shoulder.  Both elbow joints have good range of motion.  Severe CMC joint thickening and subluxation.  Bilateral MCP, PIP, DIP thickening.  Mild inflammation noted in the right 3rd and 4th PIP joints.  Left knee joint has good range of motion no warmth or effusion.  Right knee replacement has good range of motion with no warmth or effusion.  Ankle joints have good range of motion with no tenderness or joint swelling. Mild pedal edema noted bilaterally.  CDAI Exam: CDAI Score: -- Patient Global: --; Provider Global: -- Swollen:  --; Tender: -- Joint Exam 08/10/2023   No joint exam has been documented for this visit   There is currently no information documented on the homunculus. Go to the Rheumatology activity and complete the homunculus joint exam.  Investigation: No additional findings.  Imaging: No results found.  Recent Labs: Lab Results  Component Value Date   WBC 6.7 04/10/2023   HGB 10.7 (L) 04/10/2023   PLT 320 04/10/2023   NA 143 04/10/2023   K 4.4 04/10/2023   CL 108 04/10/2023  CO2 26 04/10/2023   GLUCOSE 89 04/10/2023   BUN 24 04/10/2023   CREATININE 0.87 04/10/2023   BILITOT 0.8 04/10/2023   ALKPHOS 62 10/23/2021   AST 22 04/10/2023   ALT 15 04/10/2023   PROT 7.1 04/10/2023   ALBUMIN  4.6 10/23/2021   CALCIUM  10.3 04/10/2023   GFRAA 98 04/03/2020   QFTBGOLDPLUS NEGATIVE 04/10/2023    Speciality Comments: PLQ Eye Exam: 11/25/2022 WNL @ [facility] Follow up in 1 year  Osteoporosis managed by PCP-ACY 10/12/2018 Rinvoq -diarrhea, Xeljanz -12/19/20 Reclast  August 15, 2022  Procedures:  No procedures performed Allergies: Meperidine  hcl, Codeine, and Demerol  [meperidine ]    Assessment / Plan:     Visit Diagnoses: Rheumatoid arthritis involving multiple sites with positive rheumatoid factor (HCC) -severe rheumatoid arthritis and osteoarthritis overlap: She continues to have chronic pain involving multiple joints.  Her symptoms have been most severe involving the right shoulder, both hands, both feet.  She has mild inflammation in the right 3rd and 4th PIP joints.  Patient is scheduled for a right reverse total shoulder replacement with Dr. Brunilda Capra on 10/09/2023 but is hoping for an earlier surgical date.  Discussed the importance of holding Olumiant  at least 4 days prior to surgery.  Discussed that she will require surgical clearance by Dr. Brunilda Capra prior to resuming Olumiant  as prescribed.  According to the patient and her husband she was advised to discontinue Plaquenil  at her  last office visit with Dr. Elissa Guise do not see this documented in last OV note or lab result tab.  Patient plans on going home to check if she has continued to take Plaquenil  as prescribed. She will remain on Olumiant  as prescribed for now.  She was advised to notify us  if she develops any signs or symptoms of a flare.  She will follow-up in the office in 3 months or sooner if needed.   High risk medication use - Olumiant  2 mg by mouth daily.  Unclear if she discontinued plaquenil  200 mg 1 tablet by mouth daily--she will notify us  if she has been taking the prescription as prescribed.  CBC and CMP updated on 04/10/23. Orders for CBC and CMP released today.   Lipid panel updated on 04/10/23.  TB gold negative on 04/10/23.  Discussed the importance of holding olumiant   if she develops signs or symptoms of an infection and to resume once the infection has completely cleared.  PLQ Eye Exam: 11/25/2022 WNL @ [facility]. Follow up in 1 year  - Plan: CBC with Differential/Platelet, Comprehensive metabolic panel with GFR  Chronic right shoulder pain: Plan to proceed with reverse total arthroplasty with Dr. Brunilda Capra on 10/09/23.   H/O total shoulder replacement, left - Revision x2 performed by Dr. Brunilda Capra.  Status post right knee replacement: Doing well.  Good range of motion with no warmth or effusion.  Primary osteoarthritis of both hands: CMC joint thickening and subluxation bilaterally.  MCP, PIP, DIP thickening noted.  Mild inflammation noted in the right 3rd and 4th PIP joints.  Subluxation of several DIP joints.  Primary osteoarthritis of left knee: No warmth or effusion noted.   Primary osteoarthritis of both feet: Patient continues to have chronic pain in both feet due to underlying osteoarthritis.  She has good range of motion of both ankle joints with no tenderness or synovitis.  Closed fracture of cervical vertebra, unspecified cervical vertebral level, sequela - Fall in January  2020-she fractured C1 and C2 with displacement requiring fusion on 12/06/2019. Chronic pain  Osteopenia of multiple sites - 07/10/2021 DEXA BMD 0.728, T score -2.2,RFN.Previously on fosamax -vertebral fracture.Completed 2 years of Forteo .first Reclast  08/15/22.  She is taking a vitamin D  supplement daily.  Plan to schedule next IV Reclast  infusion pending lab results and insurance approval.  Plan to hold off on updating DEXA until 2026.  Medication monitoring encounter: Plan to proceed with next IV Reclast  infusion pending lab results and approval by insurance.  Vitamin D  deficiency -Vitamin D  will be checked today.  Plan: VITAMIN D  25 Hydroxy (Vit-D Deficiency, Fractures)  Other medical conditions are listed as follows:  History of diverticulosis  History of GI bleed  Postural kyphosis of thoracic region  History of cataract  History of peripheral neuropathy  Dyslipidemia: Lipid panel obtained on 04/10/2023.  History of multiple sclerosis (HCC)  Orders: Orders Placed This Encounter  Procedures   CBC with Differential/Platelet   Comprehensive metabolic panel with GFR   VITAMIN D  25 Hydroxy (Vit-D Deficiency, Fractures)   No orders of the defined types were placed in this encounter.    Follow-Up Instructions: Return in about 3 months (around 11/10/2023) for Rheumatoid arthritis, DDD, Osteoporosis.   Romayne Clubs, PA-C  Note - This record has been created using Dragon software.  Chart creation errors have been sought, but may not always  have been located. Such creation errors do not reflect on  the standard of medical care.

## 2023-07-29 ENCOUNTER — Other Ambulatory Visit: Payer: Self-pay | Admitting: Orthopedic Surgery

## 2023-07-29 DIAGNOSIS — M19011 Primary osteoarthritis, right shoulder: Secondary | ICD-10-CM | POA: Diagnosis not present

## 2023-07-29 DIAGNOSIS — M25511 Pain in right shoulder: Secondary | ICD-10-CM

## 2023-08-05 ENCOUNTER — Ambulatory Visit
Admission: RE | Admit: 2023-08-05 | Discharge: 2023-08-05 | Disposition: A | Discharge status: Home / Self Care | Source: Ambulatory Visit | Attending: Orthopedic Surgery | Admitting: Orthopedic Surgery

## 2023-08-05 DIAGNOSIS — M25511 Pain in right shoulder: Secondary | ICD-10-CM

## 2023-08-05 DIAGNOSIS — M24011 Loose body in right shoulder: Secondary | ICD-10-CM | POA: Diagnosis not present

## 2023-08-05 DIAGNOSIS — M19011 Primary osteoarthritis, right shoulder: Secondary | ICD-10-CM | POA: Diagnosis not present

## 2023-08-10 ENCOUNTER — Other Ambulatory Visit: Payer: Self-pay | Admitting: Physician Assistant

## 2023-08-10 ENCOUNTER — Ambulatory Visit: Payer: Medicare PPO | Attending: Physician Assistant | Admitting: Physician Assistant

## 2023-08-10 ENCOUNTER — Encounter: Payer: Self-pay | Admitting: Physician Assistant

## 2023-08-10 VITALS — BP 149/76 | HR 58 | Resp 16 | Ht 63.0 in | Wt 116.0 lb

## 2023-08-10 DIAGNOSIS — S129XXS Fracture of neck, unspecified, sequela: Secondary | ICD-10-CM

## 2023-08-10 DIAGNOSIS — M19072 Primary osteoarthritis, left ankle and foot: Secondary | ICD-10-CM

## 2023-08-10 DIAGNOSIS — Z8719 Personal history of other diseases of the digestive system: Secondary | ICD-10-CM

## 2023-08-10 DIAGNOSIS — M25511 Pain in right shoulder: Secondary | ICD-10-CM | POA: Diagnosis not present

## 2023-08-10 DIAGNOSIS — M0579 Rheumatoid arthritis with rheumatoid factor of multiple sites without organ or systems involvement: Secondary | ICD-10-CM | POA: Diagnosis not present

## 2023-08-10 DIAGNOSIS — Z8639 Personal history of other endocrine, nutritional and metabolic disease: Secondary | ICD-10-CM

## 2023-08-10 DIAGNOSIS — E559 Vitamin D deficiency, unspecified: Secondary | ICD-10-CM | POA: Diagnosis not present

## 2023-08-10 DIAGNOSIS — Z96651 Presence of right artificial knee joint: Secondary | ICD-10-CM

## 2023-08-10 DIAGNOSIS — M4004 Postural kyphosis, thoracic region: Secondary | ICD-10-CM

## 2023-08-10 DIAGNOSIS — Z79899 Other long term (current) drug therapy: Secondary | ICD-10-CM

## 2023-08-10 DIAGNOSIS — Z8669 Personal history of other diseases of the nervous system and sense organs: Secondary | ICD-10-CM

## 2023-08-10 DIAGNOSIS — Z96612 Presence of left artificial shoulder joint: Secondary | ICD-10-CM

## 2023-08-10 DIAGNOSIS — M8589 Other specified disorders of bone density and structure, multiple sites: Secondary | ICD-10-CM

## 2023-08-10 DIAGNOSIS — M19071 Primary osteoarthritis, right ankle and foot: Secondary | ICD-10-CM

## 2023-08-10 DIAGNOSIS — Z5181 Encounter for therapeutic drug level monitoring: Secondary | ICD-10-CM

## 2023-08-10 DIAGNOSIS — M19041 Primary osteoarthritis, right hand: Secondary | ICD-10-CM | POA: Diagnosis not present

## 2023-08-10 DIAGNOSIS — M19042 Primary osteoarthritis, left hand: Secondary | ICD-10-CM

## 2023-08-10 DIAGNOSIS — G8929 Other chronic pain: Secondary | ICD-10-CM

## 2023-08-10 DIAGNOSIS — M1712 Unilateral primary osteoarthritis, left knee: Secondary | ICD-10-CM

## 2023-08-10 DIAGNOSIS — E785 Hyperlipidemia, unspecified: Secondary | ICD-10-CM

## 2023-08-10 DIAGNOSIS — G35 Multiple sclerosis: Secondary | ICD-10-CM

## 2023-08-10 NOTE — Telephone Encounter (Signed)
 Last Fill: 12/17/2022  Eye exam: 11/25/2022 WNL   Labs: 04/10/2023 TB Gold is negative, lipid panel is normal, CMP is normal, CBC shows low hemoglobin at 10.7.  Patient should take multivitamin with iron .  Please forward results to her PCP for the evaluation of anemia.   Next Visit: 11/18/2023  Last Visit: 08/10/2023  NU:[id number] arthritis involving multiple sites with positive rheumatoid factor (HCC)   Current Dose per office note 08/10/2023: plaquenil  200 mg 1 tablet by mouth daily.   Contacted the patient to inquire if the medication was discontinued at her appointment today because it no longer showed up in her medications. Patient states she believes she is supposed to be continuing the Plaquenil  and will need a refill.   Okay to refill Plaquenil ?

## 2023-08-10 NOTE — Telephone Encounter (Signed)
 I saw this patient today in the office.  According to the patient and her husband she was advised at her last office visit with you to discontinue Plaquenil .  I do not see documentation of you instructing her to discontinue Plaquenil  in the chart. Patient would like to resume Plaquenil  under your approval.  Please let me know you think.

## 2023-08-11 ENCOUNTER — Ambulatory Visit: Payer: Self-pay | Admitting: Physician Assistant

## 2023-08-11 ENCOUNTER — Other Ambulatory Visit: Payer: Self-pay | Admitting: Pharmacist

## 2023-08-11 LAB — COMPREHENSIVE METABOLIC PANEL WITH GFR
AG Ratio: 2.1 (calc) (ref 1.0–2.5)
ALT: 17 U/L (ref 6–29)
AST: 23 U/L (ref 10–35)
Albumin: 4.5 g/dL (ref 3.6–5.1)
Alkaline phosphatase (APISO): 42 U/L (ref 37–153)
BUN/Creatinine Ratio: 36 (calc) — ABNORMAL HIGH (ref 6–22)
BUN: 33 mg/dL — ABNORMAL HIGH (ref 7–25)
CO2: 26 mmol/L (ref 20–32)
Calcium: 9.7 mg/dL (ref 8.6–10.4)
Chloride: 108 mmol/L (ref 98–110)
Creat: 0.91 mg/dL (ref 0.60–0.95)
Globulin: 2.1 g/dL (ref 1.9–3.7)
Glucose, Bld: 84 mg/dL (ref 65–99)
Potassium: 4.7 mmol/L (ref 3.5–5.3)
Sodium: 141 mmol/L (ref 135–146)
Total Bilirubin: 0.9 mg/dL (ref 0.2–1.2)
Total Protein: 6.6 g/dL (ref 6.1–8.1)
eGFR: 64 mL/min/{1.73_m2} (ref >=60)

## 2023-08-11 LAB — CBC WITH DIFFERENTIAL/PLATELET
Absolute Lymphocytes: 2106 {cells}/uL (ref 850–3900)
Absolute Monocytes: 525 {cells}/uL (ref 200–950)
Basophils Absolute: 38 {cells}/uL (ref 0–200)
Basophils Relative: 0.6 %
Eosinophils Absolute: 141 {cells}/uL (ref 15–500)
Eosinophils Relative: 2.2 %
HCT: 34 % — ABNORMAL LOW (ref 35.0–45.0)
Hemoglobin: 10.6 g/dL — ABNORMAL LOW (ref 11.7–15.5)
MCH: 30.7 pg (ref 27.0–33.0)
MCHC: 31.2 g/dL — ABNORMAL LOW (ref 32.0–36.0)
MCV: 98.6 fL (ref 80.0–100.0)
MPV: 9.6 fL (ref 7.5–12.5)
Monocytes Relative: 8.2 %
Neutro Abs: 3590 {cells}/uL (ref 1500–7800)
Neutrophils Relative %: 56.1 %
Platelets: 278 10*3/uL (ref 140–400)
RBC: 3.45 10*6/uL — ABNORMAL LOW (ref 3.80–5.10)
RDW: 13.2 % (ref 11.0–15.0)
Total Lymphocyte: 32.9 %
WBC: 6.4 10*3/uL (ref 3.8–10.8)

## 2023-08-11 LAB — VITAMIN D 25 HYDROXY (VIT D DEFICIENCY, FRACTURES): Vit D, 25-Hydroxy: 40 ng/mL (ref 30–100)

## 2023-08-11 NOTE — Telephone Encounter (Signed)
 Left message to advise patient Ok to resume plaquenil . Prescription has been sent to the pharmacy for you.

## 2023-08-11 NOTE — Telephone Encounter (Signed)
 Ok to resume plaquenil .  I will send refill now-please notify the patient.

## 2023-08-11 NOTE — Progress Notes (Signed)
 Next infusion not yet scheduled for Reclast  IV (B2841) and due for updated orders. Diagnosis: osteoporosis; history of fractures  Dose: 5mg  IV every 12 months  Last Clinic Visit: 08/10/23 Next Clinic Visit: 11/18/23  Last infusion: 08/15/22  Labs: Vitamin D , CMP on 6//2025 wnl  Orders placed for Reclast  IV (J3489) x 1 dose along with premedication of acetaminophen  and diphenhydramine  to be administered 30 minutes before medication infusion.  Geraldene Kleine, PharmD, MPH, BCPS, CPP Clinical Pharmacist (Rheumatology and Pulmonology)

## 2023-08-11 NOTE — Telephone Encounter (Signed)
 I do not recall any discussion of discontinuing Plaquenil .  There is nothing documented in the chart regarding discontinuing Plaquenil .  She should resume Plaquenil  if she needs it and stopped it.

## 2023-08-11 NOTE — Progress Notes (Signed)
 Patient remains anemic--hemoglobin continues to trend down.   Vitamin D  Wnl CMP stable.     Ok to proceed with scheduling IV reclast .

## 2023-08-12 ENCOUNTER — Other Ambulatory Visit: Payer: Self-pay

## 2023-08-12 ENCOUNTER — Telehealth: Payer: Self-pay

## 2023-08-12 NOTE — Telephone Encounter (Signed)
 [name] and Devki, patient will be scheduled as soon as possible.  Auth Submission: NO AUTH NEEDED Site of care: Site of care: [facility] Payer: [employer] medicare Medication & CPT/J Code(s) submitted: Reclast  (Zolendronic acid) [id number] Route of submission (phone, fax, portal): portal Phone # Fax # Auth type: Buy/Bill PB Units/visits requested: 5mg  x 1 dose Reference number:  Approval from: 08/12/23 to 03/02/24

## 2023-08-14 ENCOUNTER — Other Ambulatory Visit: Payer: Self-pay

## 2023-08-14 NOTE — Progress Notes (Signed)
 Specialty Pharmacy Refill Coordination Note  Michele Bryan is a 83 y.o. female contacted today regarding refills of specialty medication(s) Baricitinib  (OLUMIANT )   Patient requested Delivery   Delivery date: 08/17/23   Verified address: [address] [place], [place], [place] [address]   Medication will be filled on 06.13.25.

## 2023-08-19 NOTE — Progress Notes (Signed)
 Reclast  scheduled for 08/27/23

## 2023-08-27 ENCOUNTER — Ambulatory Visit

## 2023-08-27 VITALS — BP 118/68 | HR 57 | Temp 97.8°F | Resp 14 | Ht 63.0 in | Wt 115.0 lb

## 2023-08-27 DIAGNOSIS — S129XXS Fracture of neck, unspecified, sequela: Secondary | ICD-10-CM | POA: Diagnosis not present

## 2023-08-27 DIAGNOSIS — M810 Age-related osteoporosis without current pathological fracture: Secondary | ICD-10-CM

## 2023-08-27 MED ORDER — ZOLEDRONIC ACID 5 MG/100ML IV SOLN
5.0000 mg | Freq: Once | INTRAVENOUS | Status: AC
  Administered 2023-08-27: 5 mg via INTRAVENOUS
  Filled 2023-08-27: qty 100

## 2023-08-27 MED ORDER — SODIUM CHLORIDE 0.9 % IV SOLN: INTRAVENOUS | Status: DC

## 2023-08-27 MED ORDER — ACETAMINOPHEN 325 MG PO TABS
650.0000 mg | ORAL_TABLET | Freq: Once | ORAL | Status: DC
Start: 2023-08-27 — End: 2023-08-27
  Filled 2023-08-27: qty 2

## 2023-08-27 MED ORDER — DIPHENHYDRAMINE HCL 25 MG PO CAPS
25.0000 mg | ORAL_CAPSULE | Freq: Once | ORAL | Status: AC
  Administered 2023-08-27: 25 mg via ORAL
  Filled 2023-08-27: qty 1

## 2023-08-27 NOTE — Progress Notes (Signed)
 Diagnosis: Osteoporosis  Provider:  Mannam, Praveen MD  Procedure: IV Infusion  IV Type: Peripheral, IV Location: L Antecubital  Reclast  (Zolendronic Acid), Dose: 5 mg  Infusion Start Time: 1043  Infusion Stop Time: 1116  Post Infusion IV Care: Observation period completed and Peripheral IV Discontinued  Discharge: Condition: Good, Destination: Home . AVS Declined  Performed by:  Rocky FORBES Sar, RN

## 2023-09-13 ENCOUNTER — Other Ambulatory Visit: Payer: Self-pay | Admitting: Physician Assistant

## 2023-09-13 ENCOUNTER — Encounter (INDEPENDENT_AMBULATORY_CARE_PROVIDER_SITE_OTHER): Payer: Self-pay

## 2023-09-13 DIAGNOSIS — M0579 Rheumatoid arthritis with rheumatoid factor of multiple sites without organ or systems involvement: Secondary | ICD-10-CM

## 2023-09-13 DIAGNOSIS — Z79899 Other long term (current) drug therapy: Secondary | ICD-10-CM

## 2023-09-13 NOTE — Telephone Encounter (Signed)
 Last Fill: 07/15/2023  Labs: 08/10/2023 Patient remains anemic--hemoglobin continues to trend down.   Vitamin D  Wnl  CMP stable.      TB Gold: 04/10/2023  NEGATIVE   Next Visit: 11/18/2023  Last Visit: 08/10/2023  IK:Myzlfjunpi arthritis involving multiple sites with positive rheumatoid factor   Current Dose per office note 08/10/2023:  Olumiant  2 mg by mouth daily.   Okay to refill Olumiant ?

## 2023-09-14 ENCOUNTER — Other Ambulatory Visit: Payer: Self-pay

## 2023-09-14 MED ORDER — BARICITINIB 2 MG PO TABS
2.0000 mg | ORAL_TABLET | Freq: Every day | ORAL | 0 refills | Status: DC
  Filled 2023-09-14: qty 30, 30d supply, fill #0
  Filled 2023-09-14: qty 90, 90d supply, fill #0
  Filled 2023-10-08: qty 30, 30d supply, fill #1
  Filled 2023-11-10: qty 30, 30d supply, fill #2

## 2023-09-14 NOTE — Progress Notes (Signed)
 Specialty Pharmacy Refill Coordination Note  Michele Bryan is a 83 y.o. female contacted today regarding refills of specialty medication(s) Baricitinib  (OLUMIANT )   Patient requested (Patient-Rptd) Delivery   Delivery date: 09/16/23   Verified address: (Patient-Rptd) [address] [place], [place], [place] [address]   Medication will be filled on 09/15/23.

## 2023-09-16 NOTE — H&P (Signed)
 Patient's anticipated LOS is less than 2 midnights, meeting these requirements: - Younger than 42 - Lives within 1 hour of care - Has a competent adult at home to recover with post-op recover - NO history of  - Chronic pain requiring opiods  - Diabetes  - Coronary Artery Disease  - Heart failure  - Heart attack  - Stroke  - DVT/VTE  - Cardiac arrhythmia  - Respiratory Failure/COPD  - Renal failure  - Anemia  - Advanced Liver disease     Michele Bryan is an 83 y.o. female.    Chief Complaint: right shoulder pain  HPI: Pt is a 83 y.o. female complaining of right shoulder pain for multiple years. Pain had continually increased since the beginning. X-rays in the clinic show end-stage arthritic changes of the right shoulder. Pt has tried various conservative treatments which have failed to alleviate their symptoms, including injections and therapy. Various options are discussed with the patient. Risks, benefits and expectations were discussed with the patient. Patient understand the risks, benefits and expectations and wishes to proceed with surgery.   PCP:  Cleotilde Planas, MD  D/C Plans: Home  PMH: Past Medical History:  Diagnosis Date   Anemia    Arthritis    GERD (gastroesophageal reflux disease)    History of blood transfusion    Hyperlipidemia    Hypertension    Lower extremity edema    MS (multiple sclerosis) (HCC)    Neuromuscular disorder (HCC)    multiple scleroosis/peripheral neuropathy   PONV (postoperative nausea and vomiting)    Rheumatoid arthritis (HCC)    Ulcer    Vitamin D  deficiency     PSH: Past Surgical History:  Procedure Laterality Date   APPLICATION OF INTRAOPERATIVE CT SCAN N/A 02/16/2020   Procedure: APPLICATION OF INTRAOPERATIVE CT SCAN;  Surgeon: Cheryle Debby LABOR, MD;  Location: [facility] OR;  Service: Neurosurgery;  Laterality: N/A;   AUGMENTATION MAMMAPLASTY     BACK SURGERY     breast augumentation     BREAST SURGERY     biopsy    COLONOSCOPY     DILATION AND CURETTAGE OF UTERUS     EYE SURGERY     both eys,cataracts   goiter     HALO APPLICATION N/A 02/16/2020   Procedure: HALO TRACTION APPLICATION;  Surgeon: Cheryle Debby LABOR, MD;  Location: [facility] OR;  Service: Neurosurgery;  Laterality: N/A;   JOINT REPLACEMENT     KNEE ARTHROSCOPY Right    ORIF SHOULDER FRACTURE Left 03/05/2018   Procedure: OPEN REDUCTION INTERNAL FIXATION (ORIF) LEFT SCAPULA;  Surgeon: Kay Kemps, MD;  [facility] OR;  Service: Orthopedics;  Laterality: Left;   partial toe amputation Right 05/16/2022   2nd toe   POSTERIOR CERVICAL FUSION/FORAMINOTOMY N/A 12/06/2019   Procedure: Cervical one-two Posterior instrumented fusion;  Surgeon: Cheryle Debby LABOR, MD;  Location: [facility] OR;  Service: Neurosurgery;  Laterality: N/A;   POSTERIOR CERVICAL FUSION/FORAMINOTOMY N/A 02/13/2020   Procedure: Revision of cervical instrumented fusion with Occiput to Cervical Four posterior instrumented fusion;  Surgeon: Cheryle Debby LABOR, MD;  Location: [facility] OR;  Service: Neurosurgery;  Laterality: N/A;  posterior   POSTERIOR CERVICAL FUSION/FORAMINOTOMY N/A 02/16/2020   Procedure: Occiput to Thoracic Two Posterior Cervical Fusion with AIRO and Application of Halo;  Surgeon: Cheryle Debby LABOR, MD;  Location: [facility] OR;  Service: Neurosurgery;  Laterality: N/A;   REVERSE SHOULDER ARTHROPLASTY Left 05/15/2017   Procedure: LEFT REVERSE SHOULDER ARTHROPLASTY;  Surgeon: Kay Kemps, MD;  Location:  [facility] OR;  Service: Orthopedics;  Laterality: Left;   REVERSE SHOULDER ARTHROPLASTY Left 10/11/2018   Procedure: left shoulder irrigation and debridement, open poly exchange and removal of painful hardware;  Surgeon: Kay Kemps, MD;  Location: [facility] ORS;  Service: Orthopedics;  Laterality: Left;   SHOULDER HEMI-ARTHROPLASTY Left 12/11/2020   Procedure: Left reverse shoulder conversion to SHOULDER HEMI-ARTHROPLASTY;  Surgeon: Kay Kemps, MD;  Location: [facility] ORS;  Service:  Orthopedics;  Laterality: Left;  with ISB   THYROID  LOBECTOMY     TOTAL KNEE ARTHROPLASTY Right 06/15/2012   Procedure: RIGHT TOTAL KNEE ARTHROPLASTY;  Surgeon: Donnice JONETTA Car, MD;  Location: [facility] ORS;  Service: Orthopedics;  Laterality: Right;   WRIST SURGERY Left     Social History:  reports that she has never smoked. She has never been exposed to tobacco smoke. She has never used smokeless tobacco. She reports that she does not currently use alcohol. She reports that she does not use drugs. BMI: Estimated body mass index is 20.37 kg/m as calculated from the following:   Height as of 08/27/23: 5' 3 (1.6 m).   Weight as of 08/27/23: 52.2 kg.  Lab Results  Component Value Date   ALBUMIN  4.6 10/23/2021   Diabetes: Patient does not have a diagnosis of diabetes.     Smoking Status:   reports that she has never smoked. She has never been exposed to tobacco smoke. She has never used smokeless tobacco.    Allergies:  Allergies  Allergen Reactions   Meperidine  Hcl     Other reaction(s): sick nausea/vomiting   Codeine Nausea And Vomiting    Other reaction(s): sick/ nausea/vomiting   Demerol  [Meperidine ] Nausea And Vomiting    Medications: No current facility-administered medications for this encounter.   Current Outpatient Medications  Medication Sig Dispense Refill   acetaminophen  (TYLENOL ) 650 MG CR tablet Take 1,300 mg by mouth daily. Take additional  1,300 mg of needed in the evening     amLODipine  (NORVASC ) 5 MG tablet Take 1 tablet (5 mg total) by mouth daily. 30 tablet 0   aspirin  EC 81 MG tablet Take 81 mg by mouth daily. Swallow whole.     baricitinib  (OLUMIANT ) tablet Take 1 tablet (2 mg total) by mouth daily. 90 tablet 0   Cholecalciferol  (VITAMIN D3) 50 MCG (2000 UT) TABS Take by mouth.     Cyanocobalamin  (VITAMIN B-12) 5000 MCG TBDP Take 5,000 mcg by mouth daily. 30 tablet 0   Docusate Sodium  (DSS) 100 MG CAPS Take by mouth as needed.     donepezil (ARICEPT) 5 MG  tablet Take 5 mg by mouth daily.     furosemide  (LASIX ) 20 MG tablet Take 1 tablet (20 mg total) by mouth daily as needed (as needed for swelling). (Patient taking differently: Take 20 mg by mouth as needed (as needed for swelling).) 15 tablet 3   gabapentin  (NEURONTIN ) 100 MG capsule Take 1 capsule (100 mg total) by mouth 3 (three) times daily. (Patient taking differently: Take 100 mg by mouth daily.) 90 capsule 11   hydroxychloroquine  (PLAQUENIL ) 200 MG tablet TAKE ONE TABLET BY MOUTH ONCE A DAY 90 tablet 0   hyoscyamine  (LEVBID ) 0.375 MG 12 hr tablet Take 0.375 mg by mouth every 12 (twelve) hours as needed for cramping.     Insulin  Pen Needle (UNIFINE PENTIPS) 31G X 5 MM MISC USE TO INJECT FORTEO  ONCE DAILY. DO NOT RE-USE. 100 each 3   losartan  (COZAAR ) 50 MG tablet Take 50 mg by mouth  2 (two) times daily.     Methenamine-Sodium Salicylate (CYSTEX PO) Take by mouth as needed.     Multiple Vitamin (MULTIVITAMINS PO) Take by mouth.     omeprazole  (PRILOSEC) 40 MG capsule Take 1 capsule (40 mg total) by mouth daily. 30 capsule 0   ondansetron  (ZOFRAN ) 4 MG tablet Take 4 mg by mouth as needed for nausea or vomiting.     polyethylene glycol (MIRALAX  / GLYCOLAX ) 17 g packet Take 17 g by mouth as needed.     potassium chloride  SA (KLOR-CON ) 20 MEQ tablet Take 1 tablet (20 mEq total) by mouth as needed (when you take Furosemide ). 15 tablet 3   rosuvastatin  (CRESTOR ) 5 MG tablet Take 5 mg by mouth daily.     sulfamethoxazole-trimethoprim (BACTRIM DS) 800-160 MG tablet 1 tablet Orally Twice a day for 10 days (Patient not taking: Reported on 09/30/2022)     traMADol  (ULTRAM ) 50 MG tablet TAKE 1 TABLET BY MOUTH EVERY 6 HOURS AS NEEDED FOR UP TO 5 DAYS 20 tablet 0   Vitamin D , Ergocalciferol , (DRISDOL ) 1.25 MG (50000 UNIT) CAPS capsule Take 1 capsule (50,000 Units total) by mouth every 7 (seven) days. 12 capsule 0   vitamin E  180 MG (400 UNITS) capsule Take 400 Units by mouth daily.     Zoledronic  Acid  (RECLAST  IV) Inject into the vein. Infusion: 08/15/2022     zolpidem  (AMBIEN ) 10 MG tablet Take 5 mg by mouth at bedtime as needed for sleep.      No results found for this or any previous visit (from the past 48 hours). No results found.  ROS: Pain with rom of the right upper extremity  Physical Exam: Alert and oriented 83 y.o. female in no acute distress Cranial nerves 2-12 intact Cervical spine: full rom with no tenderness, nv intact distally Chest: active breath sounds bilaterally, no wheeze rhonchi or rales Heart: regular rate and rhythm, no murmur Abd: non tender non distended with active bowel sounds Hip is stable with rom  Right shoulder painful and weak rom Nv intact distally No rashes or edema distally  Assessment/Plan Assessment: right shoulder cuff arthropathy   Plan:  Patient will undergo a right reverse total shoulder by Dr. Kay at [facility] Risks benefits and expectations were discussed with the patient. Patient understand risks, benefits and expectations and wishes to proceed. Preoperative templating of the joint replacement has been completed, documented, and submitted to the Operating Room personnel in order to optimize intra-operative equipment management.   Arvella Fireman PA-C, [facility] is now [employer] [address], [address], [place], [place] [address] Phone: [phone] [email or web address] Facebook  [employer]

## 2023-09-17 ENCOUNTER — Other Ambulatory Visit: Payer: Self-pay | Admitting: *Deleted

## 2023-09-17 DIAGNOSIS — I10 Essential (primary) hypertension: Secondary | ICD-10-CM | POA: Diagnosis not present

## 2023-09-17 DIAGNOSIS — Z6822 Body mass index (BMI) 22.0-22.9, adult: Secondary | ICD-10-CM | POA: Diagnosis not present

## 2023-09-17 DIAGNOSIS — Z1331 Encounter for screening for depression: Secondary | ICD-10-CM | POA: Diagnosis not present

## 2023-09-17 DIAGNOSIS — E7800 Pure hypercholesterolemia, unspecified: Secondary | ICD-10-CM | POA: Diagnosis not present

## 2023-09-17 DIAGNOSIS — L304 Erythema intertrigo: Secondary | ICD-10-CM | POA: Diagnosis not present

## 2023-09-17 DIAGNOSIS — F03A0 Unspecified dementia, mild, without behavioral disturbance, psychotic disturbance, mood disturbance, and anxiety: Secondary | ICD-10-CM | POA: Diagnosis not present

## 2023-09-17 DIAGNOSIS — Z79899 Other long term (current) drug therapy: Secondary | ICD-10-CM | POA: Diagnosis not present

## 2023-09-17 DIAGNOSIS — Z0000 Encounter for general adult medical examination without abnormal findings: Secondary | ICD-10-CM | POA: Diagnosis not present

## 2023-09-17 DIAGNOSIS — K5900 Constipation, unspecified: Secondary | ICD-10-CM | POA: Diagnosis not present

## 2023-09-17 DIAGNOSIS — G4700 Insomnia, unspecified: Secondary | ICD-10-CM | POA: Diagnosis not present

## 2023-09-17 LAB — COMPREHENSIVE METABOLIC PANEL WITH GFR
AG Ratio: 2.5 (calc) (ref 1.0–2.5)
ALT: 19 U/L (ref 6–29)
AST: 23 U/L (ref 10–35)
Albumin: 4.3 g/dL (ref 3.6–5.1)
Alkaline phosphatase (APISO): 45 U/L (ref 37–153)
BUN/Creatinine Ratio: 35 (calc) — ABNORMAL HIGH (ref 6–22)
BUN: 38 mg/dL — ABNORMAL HIGH (ref 7–25)
CO2: 27 mmol/L (ref 20–32)
Calcium: 9.3 mg/dL (ref 8.6–10.4)
Chloride: 106 mmol/L (ref 98–110)
Creat: 1.1 mg/dL — ABNORMAL HIGH (ref 0.60–0.95)
Globulin: 1.7 g/dL — ABNORMAL LOW (ref 1.9–3.7)
Glucose, Bld: 78 mg/dL (ref 65–99)
Potassium: 4.3 mmol/L (ref 3.5–5.3)
Sodium: 140 mmol/L (ref 135–146)
Total Bilirubin: 0.7 mg/dL (ref 0.2–1.2)
Total Protein: 6 g/dL — ABNORMAL LOW (ref 6.1–8.1)
eGFR: 51 mL/min/1.73m2 — ABNORMAL LOW (ref >=60)

## 2023-09-17 LAB — CBC WITH DIFFERENTIAL/PLATELET
Absolute Lymphocytes: 1965 {cells}/uL (ref 850–3900)
Absolute Monocytes: 734 {cells}/uL (ref 200–950)
Basophils Absolute: 48 {cells}/uL (ref 0–200)
Basophils Relative: 0.7 %
Eosinophils Absolute: 0 {cells}/uL — ABNORMAL LOW (ref 15–500)
Eosinophils Relative: 0 %
HCT: 33.3 % — ABNORMAL LOW (ref 35.0–45.0)
Hemoglobin: 10.4 g/dL — ABNORMAL LOW (ref 11.7–15.5)
MCH: 30.3 pg (ref 27.0–33.0)
MCHC: 31.2 g/dL — ABNORMAL LOW (ref 32.0–36.0)
MCV: 97.1 fL (ref 80.0–100.0)
MPV: 9.8 fL (ref 7.5–12.5)
Monocytes Relative: 10.8 %
Neutro Abs: 4053 {cells}/uL (ref 1500–7800)
Neutrophils Relative %: 59.6 %
Platelets: 324 Thousand/uL (ref 140–400)
RBC: 3.43 Million/uL — ABNORMAL LOW (ref 3.80–5.10)
RDW: 12.9 % (ref 11.0–15.0)
Total Lymphocyte: 28.9 %
WBC: 6.8 Thousand/uL (ref 3.8–10.8)

## 2023-09-18 ENCOUNTER — Encounter: Payer: Self-pay | Admitting: Advanced Practice Midwife

## 2023-09-18 ENCOUNTER — Ambulatory Visit: Payer: Self-pay | Admitting: Physician Assistant

## 2023-09-18 NOTE — Progress Notes (Signed)
 Creatinine is elevated-1.10 and GFR is low-35. Avoid the use of NSAIDs and increase water  intake.  Please clarify if she has had any medication changes?  Total protein is low-6.0.   RBC count, hgb, and hct are low--continue to trend down. Slight decline in renal function may be related to anemia-please clarify if she is taking anything for anemia?

## 2023-09-21 NOTE — Progress Notes (Signed)
 Recommend increasing iron  intake--she can try ferous sulfate 325 mg daily OTC. Iron  can cause GI upset/constipation so please make the patient aware.

## 2023-09-24 NOTE — Progress Notes (Addendum)
 Anesthesia Review:  PCP: Olam Pinal   Rheumatology - [place]- preop clearance  in Media Tab dated- ____  LOV 08/10/23.  Cardiologist : Dan Bensimhon  LOV 05/20/21   PPM/ ICD: Device Orders: Rep Notified:  Chest x-ray : EKG : 09/28/23  Echo : 2022  Stress test: Cardiac Cath :   Activity level: can do a flight of stairs without difficutly  Sleep Study/ CPAP : none  Fasting Blood Sugar :      / Checks Blood Sugar -- times a day:    Blood Thinner/ Instructions /Last Dose: ASA / Instructions/ Last Dose :    81 mg aspirin     09/17/23- cbcb/diff and cmp in epic    PT has MS.     CBC with hgb of 10.4 routed to Dr Marcey Her on 09/28/23.

## 2023-09-24 NOTE — Patient Instructions (Signed)
 SURGICAL WAITING ROOM VISITATION  Patients having surgery or a procedure may have no more than 2 support people in the waiting area - these visitors may rotate.    Children under the age of 66 must have an adult with them who is not the patient.  Visitors with respiratory illnesses are discouraged from visiting and should remain at home.  If the patient needs to stay at the hospital during part of their recovery, the visitor guidelines for inpatient rooms apply. Pre-op nurse will coordinate an appropriate time for 1 support person to accompany patient in pre-op.  This support person may not rotate.    Please refer to the [facility] website for the visitor guidelines for Inpatients (after your surgery is over and you are in a regular room).       Your procedure is scheduled on:  10/09/2023    Report to [facility] Main Entrance    Report to admitting at    0800AM   Call this number if you have problems the morning of surgery [phone]   Do not eat food :After Midnight.   After Midnight you may have the following liquids until _ 0730_____ AM DAY OF SURGERY  Water  Non-Citrus Juices (without pulp, NO RED-Apple, White grape, White cranberry) Black Coffee (NO MILK/CREAM OR CREAMERS, sugar ok)  Clear Tea (NO MILK/CREAM OR CREAMERS, sugar ok) regular and decaf                             Plain Jell-O (NO RED)                                           Fruit ices (not with fruit pulp, NO RED)                                     Popsicles (NO RED)                                                               Sports drinks like Gatorade (NO RED)                    The day of surgery:  Drink ONE (1) Pre-Surgery Clear Ensure or G2 at  0730AM the morning of surgery. Drink in one sitting. Do not sip.  This drink was given to you during your hospital  pre-op appointment visit. Nothing else to drink after completing the  Pre-Surgery Clear Ensure or G2.          If you have  questions, please contact your surgeon's office.       Oral Hygiene is also important to reduce your risk of infection.                                    Remember - BRUSH YOUR TEETH THE MORNING OF SURGERY WITH YOUR REGULAR TOOTHPASTE  DENTURES WILL BE REMOVED PRIOR TO SURGERY PLEASE DO NOT APPLY Poly grip OR  ADHESIVES!!!   Do NOT smoke after Midnight   Stop all vitamins and herbal supplements 7 days before surgery.   Take these medicines the morning of surgery with A SIP OF WATER :  amlodipoine, aricept, gabapentin , omeprazole    DO NOT TAKE ANY ORAL DIABETIC MEDICATIONS DAY OF YOUR SURGERY  Bring CPAP mask and tubing day of surgery.                              You may not have any metal on your body including hair pins, jewelry, and body piercing             Do not wear make-up, lotions, powders, perfumes/cologne, or deodorant  Do not wear nail polish including gel and S&S, artificial/acrylic nails, or any other type of covering on natural nails including finger and toenails. If you have artificial nails, gel coating, etc. that needs to be removed by a nail salon please have this removed prior to surgery or surgery may need to be canceled/ delayed if the surgeon/ anesthesia feels like they are unable to be safely monitored.   Do not shave  48 hours prior to surgery.               Men may shave face and neck.   Do not bring valuables to the hospital. [facility] IS NOT             RESPONSIBLE   FOR VALUABLES.   Contacts, glasses, dentures or bridgework may not be worn into surgery.   Bring small overnight bag day of surgery.   DO NOT BRING YOUR HOME MEDICATIONS TO THE HOSPITAL. PHARMACY WILL DISPENSE MEDICATIONS LISTED ON YOUR MEDICATION LIST TO YOU DURING YOUR ADMISSION IN THE HOSPITAL!    Patients discharged on the day of surgery will not be allowed to drive home.  Someone NEEDS to stay with you for the first 24 hours after anesthesia.   Special Instructions: Bring a  copy of your healthcare power of attorney and living will documents the day of surgery if you haven't scanned them before.              Please read over the following fact sheets you were given: IF YOU HAVE QUESTIONS ABOUT YOUR PRE-OP INSTRUCTIONS PLEASE CALL [phone].   If you received a COVID test during your pre-op visit  it is requested that you wear a mask when out in public, stay away from anyone that may not be feeling well and notify your surgeon if you develop symptoms. If you test positive for Covid or have been in contact with anyone that has tested positive in the last 10 days please notify you surgeon.      Pre-operative 5 CHG Bath Instructions   You can play a key role in reducing the risk of infection after surgery. Your skin needs to be as free of germs as possible. You can reduce the number of germs on your skin by washing with CHG (chlorhexidine  gluconate) soap before surgery. CHG is an antiseptic soap that kills germs and continues to kill germs even after washing.   DO NOT use if you have an allergy to chlorhexidine /CHG or antibacterial soaps. If your skin becomes reddened or irritated, stop using the CHG and notify one of our RNs at [phone].   Please shower with the CHG soap starting 4 days before surgery using the following schedule:     Please keep in  mind the following:  DO NOT shave, including legs and underarms, starting the day of your first shower.   You may shave your face at any point before/day of surgery.  Place clean sheets on your bed the day you start using CHG soap. Use a clean washcloth (not used since being washed) for each shower. DO NOT sleep with pets once you start using the CHG.   CHG Shower Instructions:  If you choose to wash your hair and private area, wash first with your normal shampoo/soap.  After you use shampoo/soap, rinse your hair and body thoroughly to remove shampoo/soap residue.  Turn the water  OFF and apply about 3 tablespoons  (45 ml) of CHG soap to a CLEAN washcloth.  Apply CHG soap ONLY FROM YOUR NECK DOWN TO YOUR TOES (washing for 3-5 minutes)  DO NOT use CHG soap on face, private areas, open wounds, or sores.  Pay special attention to the area where your surgery is being performed.  If you are having back surgery, having someone wash your back for you may be helpful. Wait 2 minutes after CHG soap is applied, then you may rinse off the CHG soap.  Pat dry with a clean towel  Put on clean clothes/pajamas   If you choose to wear lotion, please use ONLY the CHG-compatible lotions on the back of this paper.     Additional instructions for the day of surgery: DO NOT APPLY any lotions, deodorants, cologne, or perfumes.   Put on clean/comfortable clothes.  Brush your teeth.  Ask your nurse before applying any prescription medications to the skin.      CHG Compatible Lotions   Aveeno Moisturizing lotion  Cetaphil Moisturizing Cream  Cetaphil Moisturizing Lotion  Clairol Herbal Essence Moisturizing Lotion, Dry Skin  Clairol Herbal Essence Moisturizing Lotion, Extra Dry Skin  Clairol Herbal Essence Moisturizing Lotion, Normal Skin  Curel Age Defying Therapeutic Moisturizing Lotion with Alpha Hydroxy  Curel Extreme Care Body Lotion  Curel Soothing Hands Moisturizing Hand Lotion  Curel Therapeutic Moisturizing Cream, Fragrance-Free  Curel Therapeutic Moisturizing Lotion, Fragrance-Free  Curel Therapeutic Moisturizing Lotion, Original Formula  Eucerin Daily Replenishing Lotion  Eucerin Dry Skin Therapy Plus Alpha Hydroxy Crme  Eucerin Dry Skin Therapy Plus Alpha Hydroxy Lotion  Eucerin Original Crme  Eucerin Original Lotion  Eucerin Plus Crme Eucerin Plus Lotion  Eucerin TriLipid Replenishing Lotion  Keri Anti-Bacterial Hand Lotion  Keri Deep Conditioning Original Lotion Dry Skin Formula Softly Scented  Keri Deep Conditioning Original Lotion, Fragrance Free Sensitive Skin Formula  Keri Lotion Fast  Absorbing Fragrance Free Sensitive Skin Formula  Keri Lotion Fast Absorbing Softly Scented Dry Skin Formula  Keri Original Lotion  Keri Skin Renewal Lotion Keri Silky Smooth Lotion  Keri Silky Smooth Sensitive Skin Lotion  Nivea Body Creamy Conditioning Oil  Nivea Body Extra Enriched Lotion  Nivea Body Original Lotion  Nivea Body Sheer Moisturizing Lotion Nivea Crme  Nivea Skin Firming Lotion  NutraDerm 30 Skin Lotion  NutraDerm Skin Lotion  NutraDerm Therapeutic Skin Cream  NutraDerm Therapeutic Skin Lotion  ProShield Protective Hand Cream  Provon moisturizing lotion  [place]- Preparing for Total Shoulder Arthroplasty    Before surgery, you can play an important role. Because skin is not sterile, your skin needs to be as free of germs as possible. You can reduce the number of germs on your skin by using the following products. Benzoyl Peroxide Gel Reduces the number of germs present on the skin Applied twice a day to shoulder  area starting two days before surgery    ==================================================================  Please follow these instructions carefully:  BENZOYL PEROXIDE 5% GEL  Please do not use if you have an allergy to benzoyl peroxide.   If your skin becomes reddened/irritated stop using the benzoyl peroxide.  Starting two days before surgery, apply as follows: Apply benzoyl peroxide in the morning and at night. Apply after taking a shower. If you are not taking a shower clean entire shoulder front, back, and side along with the armpit with a clean wet washcloth.  Place a quarter-sized dollop on your shoulder and rub in thoroughly, making sure to cover the front, back, and side of your shoulder, along with the armpit.   2 days before ____ AM   ____ PM              1 day before ____ AM   ____ PM                         Do this twice a day for two days.  (Last application is the night before surgery, AFTER using the CHG soap as described  below).  Do NOT apply benzoyl peroxide gel on the day of surgery.

## 2023-09-28 ENCOUNTER — Encounter (HOSPITAL_COMMUNITY)
Admission: RE | Admit: 2023-09-28 | Discharge: 2023-09-28 | Disposition: A | Discharge status: Home / Self Care | Source: Ambulatory Visit | Attending: Orthopedic Surgery | Admitting: Orthopedic Surgery

## 2023-09-28 ENCOUNTER — Encounter (HOSPITAL_COMMUNITY): Payer: Self-pay

## 2023-09-28 ENCOUNTER — Other Ambulatory Visit: Payer: Self-pay

## 2023-09-28 VITALS — BP 138/96 | HR 65 | Temp 98.5°F | Resp 16 | Ht 63.0 in | Wt 114.0 lb

## 2023-09-28 DIAGNOSIS — Z01818 Encounter for other preprocedural examination: Secondary | ICD-10-CM | POA: Diagnosis not present

## 2023-09-28 DIAGNOSIS — M75101 Unspecified rotator cuff tear or rupture of right shoulder, not specified as traumatic: Secondary | ICD-10-CM | POA: Diagnosis not present

## 2023-09-28 DIAGNOSIS — I5030 Unspecified diastolic (congestive) heart failure: Secondary | ICD-10-CM | POA: Diagnosis not present

## 2023-09-28 DIAGNOSIS — K219 Gastro-esophageal reflux disease without esophagitis: Secondary | ICD-10-CM | POA: Insufficient documentation

## 2023-09-28 DIAGNOSIS — M069 Rheumatoid arthritis, unspecified: Secondary | ICD-10-CM | POA: Diagnosis not present

## 2023-09-28 DIAGNOSIS — G629 Polyneuropathy, unspecified: Secondary | ICD-10-CM | POA: Insufficient documentation

## 2023-09-28 DIAGNOSIS — G35 Multiple sclerosis: Secondary | ICD-10-CM | POA: Insufficient documentation

## 2023-09-28 DIAGNOSIS — I110 Hypertensive heart disease with heart failure: Secondary | ICD-10-CM | POA: Insufficient documentation

## 2023-09-28 HISTORY — DX: Fibromyalgia: M79.7

## 2023-09-28 LAB — BASIC METABOLIC PANEL WITH GFR
Anion gap: 7 (ref 5–15)
BUN: 28 mg/dL — ABNORMAL HIGH (ref 8–23)
CO2: 24 mmol/L (ref 22–32)
Calcium: 9.2 mg/dL (ref 8.9–10.3)
Chloride: 109 mmol/L (ref 98–111)
Creatinine, Ser: 0.81 mg/dL (ref 0.44–1.00)
GFR, Estimated: >60 mL/min (ref >60)
Glucose, Bld: 86 mg/dL (ref 70–99)
Potassium: 4.3 mmol/L (ref 3.5–5.1)
Sodium: 140 mmol/L (ref 135–145)

## 2023-09-28 LAB — CBC
HCT: 32.8 % — ABNORMAL LOW (ref 36.0–46.0)
Hemoglobin: 10.4 g/dL — ABNORMAL LOW (ref 12.0–15.0)
MCH: 31.7 pg (ref 26.0–34.0)
MCHC: 31.7 g/dL (ref 30.0–36.0)
MCV: 100 fL (ref 80.0–100.0)
Platelets: 242 K/uL (ref 150–400)
RBC: 3.28 MIL/uL — ABNORMAL LOW (ref 3.87–5.11)
RDW: 14 % (ref 11.5–15.5)
WBC: 5.7 K/uL (ref 4.0–10.5)
nRBC: 0 % (ref 0.0–0.2)

## 2023-09-28 LAB — SURGICAL PCR SCREEN
MRSA, PCR: NEGATIVE
Staphylococcus aureus: NEGATIVE

## 2023-09-29 NOTE — Anesthesia Preprocedure Evaluation (Addendum)
 Anesthesia Evaluation  Patient identified by MRN, date of birth, ID band Patient awake    Reviewed: Allergy & Precautions, NPO status , Patient's Chart, lab work & pertinent test results  History of Anesthesia Complications (+) PONV and history of anesthetic complications  Airway Mallampati: II  TM Distance: >3 FB Neck ROM: Limited    Dental no notable dental hx.    Pulmonary neg pulmonary ROS   Pulmonary exam normal        Cardiovascular hypertension, Pt. on medications Normal cardiovascular exam     Neuro/Psych MS (multiple sclerosis)  Neuromuscular disease  negative psych ROS   GI/Hepatic ,GERD  Medicated and Controlled,,(+)     substance abuse    Endo/Other  negative endocrine ROS    Renal/GU negative Renal ROS     Musculoskeletal  (+) Arthritis ,  Fibromyalgia -, narcotic dependent  Abdominal   Peds  Hematology  (+) Blood dyscrasia, anemia   Anesthesia Other Findings Unspecified rotator cuff tear or rupture of right shoulder, not specified as traumatic  Reproductive/Obstetrics                              Anesthesia Physical Anesthesia Plan  ASA: 3  Anesthesia Plan: General and Regional   Post-op Pain Management: Regional block*   Induction: Intravenous  PONV Risk Score and Plan: 4 or greater and Ondansetron , Dexamethasone , Propofol  infusion and Treatment may vary due to age or medical condition  Airway Management Planned: Oral ETT and Video Laryngoscope Planned  Additional Equipment:   Intra-op Plan:   Post-operative Plan: Extubation in OR  Informed Consent: I have reviewed the patients History and Physical, chart, labs and discussed the procedure including the risks, benefits and alternatives for the proposed anesthesia with the patient or authorized representative who has indicated his/her understanding and acceptance.     Dental advisory given  Plan  Discussed with: CRNA  Anesthesia Plan Comments: (PAT note from 7/28)         Anesthesia Quick Evaluation

## 2023-09-29 NOTE — Progress Notes (Signed)
 Case: [id number] Date/Time: 10/09/23 1015   Procedure: ARTHROPLASTY, SHOULDER, TOTAL, REVERSE (Right: Shoulder)   Anesthesia type: General   Diagnosis: Rotator cuff syndrome of right shoulder [M75.101]   Pre-op diagnosis: Unspecified rotator cuff tear or rupture of right shoulder, not specified as traumatic   Location: [facility] ROOM 06 / [facility]   Surgeons: Kay Kemps, MD       DISCUSSION: Michele Bryan is an 83 yo female with PMH of HTN, HFpEF, GERD, anemia, multiple sclerosis, RA, peripheral neuropathy, thyroid  lobectomy, augmentation mammaplasty, TKA (right 06/15/12), shoulder surgery (left reverse shoulder 05/15/17; ORIF left scapula 03/05/18; left shoulder open hardware removal, polyethylene exchange for reverse shoulder replacement 10/11/18), S/p C1-2 posterior fusion in 12/2019 with revision 02/2020.  Prior anesthesia complications include PONV  Patient follows with Cardiology for LE edema. Last seen in 2023. Echo in 2022 showed normal LVEF with grade II DD. Symptoms improved with compression stockings and prn Lasix .  Patient with hx of relapsing remitting MS. Has not been seen by Neurology since 2019. PCP ordered MRI Brain in 2023 which was stable.   Patient follows with Rheumatology for RA. She is on Olumiant  and was advised to stop this 4 days prior to surgery. Also on Plaquenil .   VS: BP (!) 138/96   Pulse 65   Temp 36.9 C (Oral)   Resp 16   Ht 5' 3 (1.6 m)   Wt 51.7 kg   SpO2 98%   BMI 20.19 kg/m   PROVIDERS: Cleotilde Planas, MD   LABS: Labs reviewed: Acceptable for surgery. Anemia stable. Will order T&S for DOS (all labs ordered are listed, but only abnormal results are displayed)  Labs Reviewed  CBC - Abnormal; Notable for the following components:      Result Value   RBC 3.28 (*)    Hemoglobin 10.4 (*)    HCT 32.8 (*)    All other components within normal limits  BASIC METABOLIC PANEL WITH GFR - Abnormal; Notable for the following components:   BUN 28 (*)    All  other components within normal limits  SURGICAL PCR SCREEN     IMAGES:   EKG 09/28/23:  Normal sinus rhythm Septal infarct , age undetermined Abnormal ECG  CV: Echo 08/17/2020   1. Left ventricular ejection fraction, by estimation, is 60 to 65%. The  left ventricle has normal function. The left ventricle has no regional  wall motion abnormalities. Left ventricular diastolic parameters are  consistent with Grade II diastolic  dysfunction (pseudonormalization).   2. Right ventricular systolic function is normal. The right ventricular  size is normal.   3. Left atrial size was mild to moderately dilated.   4. Right atrial size was mild to moderately dilated.   5. The mitral valve is normal in structure. Mild mitral valve  regurgitation. No evidence of mitral stenosis.   6. The aortic valve is normal in structure. There is moderate  calcification of the aortic valve. Aortic valve regurgitation is not  visualized. Mild to moderate aortic valve sclerosis/calcification is  present, without any evidence of aortic stenosis.   7. The inferior vena cava is normal in size with greater than 50%  respiratory variability, suggesting right atrial pressure of 3 mmHg.  Past Medical History:  Diagnosis Date   Anemia    Arthritis    Fibromyalgia    GERD (gastroesophageal reflux disease)    History of blood transfusion    Hyperlipidemia    Hypertension    Lower extremity edema  MS (multiple sclerosis) (HCC)    Neuromuscular disorder (HCC)    multiple scleroosis/peripheral neuropathy   PONV (postoperative nausea and vomiting)    Rheumatoid arthritis (HCC)    Ulcer    Vitamin D  deficiency     Past Surgical History:  Procedure Laterality Date   APPLICATION OF INTRAOPERATIVE CT SCAN N/A 02/16/2020   Procedure: APPLICATION OF INTRAOPERATIVE CT SCAN;  Surgeon: Cheryle Debby LABOR, MD;  Location: [facility];  Service: Neurosurgery;  Laterality: N/A;   AUGMENTATION MAMMAPLASTY     BACK  SURGERY     breast augumentation     BREAST SURGERY     biopsy   COLONOSCOPY     DILATION AND CURETTAGE OF UTERUS     EYE SURGERY     both eys,cataracts   goiter     HALO APPLICATION N/A 02/16/2020   Procedure: HALO TRACTION APPLICATION;  Surgeon: Cheryle Debby LABOR, MD;  Location: [facility];  Service: Neurosurgery;  Laterality: N/A;   JOINT REPLACEMENT     KNEE ARTHROSCOPY Right    ORIF SHOULDER FRACTURE Left 03/05/2018   Procedure: OPEN REDUCTION INTERNAL FIXATION (ORIF) LEFT SCAPULA;  Surgeon: Kay Kemps, MD;  Location: [facility] OR;  Service: Orthopedics;  Laterality: Left;   partial toe amputation Right 05/16/2022   2nd toe   POSTERIOR CERVICAL FUSION/FORAMINOTOMY N/A 12/06/2019   Procedure: Cervical one-two Posterior instrumented fusion;  Surgeon: Cheryle Debby LABOR, MD;  Location: [facility];  Service: Neurosurgery;  Laterality: N/A;   POSTERIOR CERVICAL FUSION/FORAMINOTOMY N/A 02/13/2020   Procedure: Revision of cervical instrumented fusion with Occiput to Cervical Four posterior instrumented fusion;  Surgeon: Cheryle Debby LABOR, MD;  Location: [facility];  Service: Neurosurgery;  Laterality: N/A;  posterior   POSTERIOR CERVICAL FUSION/FORAMINOTOMY N/A 02/16/2020   Procedure: Occiput to Thoracic Two Posterior Cervical Fusion with AIRO and Application of Halo;  Surgeon: Cheryle Debby LABOR, MD;  Location: [facility] OR;  Service: Neurosurgery;  Laterality: N/A;   REVERSE SHOULDER ARTHROPLASTY Left 05/15/2017   Procedure: LEFT REVERSE SHOULDER ARTHROPLASTY;  Surgeon: Kay Kemps, MD;  Location: [facility] OR;  Service: Orthopedics;  Laterality: Left;   REVERSE SHOULDER ARTHROPLASTY Left 10/11/2018   Procedure: left shoulder irrigation and debridement, open poly exchange and removal of painful hardware;  Surgeon: Kay Kemps, MD;  Location[facility];  Service: Orthopedics;  Laterality: Left;   SHOULDER HEMI-ARTHROPLASTY Left 12/11/2020   Procedure: Left reverse shoulder conversion to SHOULDER HEMI-ARTHROPLASTY;   Surgeon: Kay Kemps, MD;  Location: [facility];  Service: Orthopedics;  Laterality: Left;  with ISB   THYROID  LOBECTOMY     TOTAL KNEE ARTHROPLASTY Right 06/15/2012   Procedure: RIGHT TOTAL KNEE ARTHROPLASTY;  Surgeon: Donnice JONETTA Car, MD;  Location: [facility];  Service: Orthopedics;  Laterality: Right;   WRIST SURGERY Left     MEDICATIONS:  acetaminophen  (TYLENOL ) 650 MG CR tablet   amLODipine  (NORVASC ) 5 MG tablet   aspirin  EC 81 MG tablet   baricitinib  (OLUMIANT ) tablet   Cyanocobalamin  (VITAMIN B-12) 5000 MCG TBDP   Docusate Sodium  (DSS) 100 MG CAPS   furosemide  (LASIX ) 20 MG tablet   gabapentin  (NEURONTIN ) 100 MG capsule   HYDROcodone -acetaminophen  (NORCO/VICODIN) 5-325 MG tablet   hydroxychloroquine  (PLAQUENIL ) 200 MG tablet   hyoscyamine  (LEVBID ) 0.375 MG 12 hr tablet   losartan  (COZAAR ) 50 MG tablet   Multiple Vitamin (MULTIVITAMINS PO)   nystatin (MYCOSTATIN/NYSTOP) powder   omeprazole  (PRILOSEC) 40 MG capsule   polyethylene glycol (MIRALAX  / GLYCOLAX ) 17 g packet   potassium chloride   SA (KLOR-CON ) 20 MEQ tablet   rosuvastatin  (CRESTOR ) 5 MG tablet   traMADol  (ULTRAM ) 50 MG tablet   traZODone  (DESYREL ) 50 MG tablet   vitamin E  180 MG (400 UNITS) capsule   No current facility-administered medications for this encounter.   Burnard CHRISTELLA Odis DEVONNA [facility] Short Stay/Anesthesiology [facility] Phone [phone] 09/29/2023 3:13 PM

## 2023-10-08 ENCOUNTER — Other Ambulatory Visit: Payer: Self-pay | Admitting: Pharmacy Technician

## 2023-10-08 ENCOUNTER — Encounter (INDEPENDENT_AMBULATORY_CARE_PROVIDER_SITE_OTHER): Payer: Self-pay

## 2023-10-08 ENCOUNTER — Other Ambulatory Visit: Payer: Self-pay

## 2023-10-08 NOTE — Progress Notes (Signed)
 Specialty Pharmacy Refill Coordination Note  Michele Bryan is a 83 y.o. female contacted today regarding refills of specialty medication(s) Baricitinib  (OLUMIANT )   Patient requested (Patient-Rptd) Delivery   Delivery date: 10/16/23  Verified address: (Patient-Rptd) [address] [place], [place], [place] [address]   Medication will be filled on 10/15/23.

## 2023-10-09 ENCOUNTER — Ambulatory Visit (HOSPITAL_COMMUNITY): Payer: Self-pay | Admitting: Medical

## 2023-10-09 ENCOUNTER — Ambulatory Visit (HOSPITAL_COMMUNITY)

## 2023-10-09 ENCOUNTER — Ambulatory Visit (HOSPITAL_COMMUNITY)
Admission: RE | Admit: 2023-10-09 | Discharge: 2023-10-09 | Disposition: A | Discharge status: Home / Self Care | Source: Ambulatory Visit | Attending: Orthopedic Surgery | Admitting: Orthopedic Surgery

## 2023-10-09 ENCOUNTER — Ambulatory Visit (HOSPITAL_COMMUNITY): Payer: Self-pay | Admitting: Physician Assistant

## 2023-10-09 ENCOUNTER — Encounter (HOSPITAL_COMMUNITY)
Admission: RE | Disposition: A | Discharge status: Home / Self Care | Payer: Self-pay | Source: Ambulatory Visit | Attending: Orthopedic Surgery

## 2023-10-09 ENCOUNTER — Encounter (HOSPITAL_COMMUNITY): Payer: Self-pay | Admitting: Orthopedic Surgery

## 2023-10-09 DIAGNOSIS — E785 Hyperlipidemia, unspecified: Secondary | ICD-10-CM | POA: Diagnosis not present

## 2023-10-09 DIAGNOSIS — M75121 Complete rotator cuff tear or rupture of right shoulder, not specified as traumatic: Secondary | ICD-10-CM | POA: Insufficient documentation

## 2023-10-09 DIAGNOSIS — K219 Gastro-esophageal reflux disease without esophagitis: Secondary | ICD-10-CM | POA: Insufficient documentation

## 2023-10-09 DIAGNOSIS — I10 Essential (primary) hypertension: Secondary | ICD-10-CM | POA: Diagnosis not present

## 2023-10-09 DIAGNOSIS — M1990 Unspecified osteoarthritis, unspecified site: Secondary | ICD-10-CM | POA: Diagnosis not present

## 2023-10-09 DIAGNOSIS — G35 Multiple sclerosis: Secondary | ICD-10-CM | POA: Insufficient documentation

## 2023-10-09 DIAGNOSIS — M797 Fibromyalgia: Secondary | ICD-10-CM | POA: Insufficient documentation

## 2023-10-09 DIAGNOSIS — M75101 Unspecified rotator cuff tear or rupture of right shoulder, not specified as traumatic: Secondary | ICD-10-CM

## 2023-10-09 DIAGNOSIS — G8918 Other acute postprocedural pain: Secondary | ICD-10-CM | POA: Diagnosis not present

## 2023-10-09 DIAGNOSIS — M19011 Primary osteoarthritis, right shoulder: Secondary | ICD-10-CM

## 2023-10-09 DIAGNOSIS — Z01818 Encounter for other preprocedural examination: Secondary | ICD-10-CM

## 2023-10-09 DIAGNOSIS — Z471 Aftercare following joint replacement surgery: Secondary | ICD-10-CM | POA: Diagnosis not present

## 2023-10-09 DIAGNOSIS — M069 Rheumatoid arthritis, unspecified: Secondary | ICD-10-CM | POA: Diagnosis not present

## 2023-10-09 DIAGNOSIS — D638 Anemia in other chronic diseases classified elsewhere: Secondary | ICD-10-CM

## 2023-10-09 DIAGNOSIS — Z96611 Presence of right artificial shoulder joint: Secondary | ICD-10-CM | POA: Diagnosis not present

## 2023-10-09 DIAGNOSIS — M12811 Other specific arthropathies, not elsewhere classified, right shoulder: Secondary | ICD-10-CM | POA: Diagnosis not present

## 2023-10-09 HISTORY — PX: REVERSE SHOULDER ARTHROPLASTY: SHX5054

## 2023-10-09 LAB — TYPE AND SCREEN
ABO/RH(D): O POS
Antibody Screen: NEGATIVE

## 2023-10-09 SURGERY — ARTHROPLASTY, SHOULDER, TOTAL, REVERSE
Anesthesia: Regional | Site: Shoulder | Laterality: Right

## 2023-10-09 MED ORDER — ONDANSETRON HCL 4 MG/2ML IJ SOLN
INTRAMUSCULAR | Status: DC | PRN
  Administered 2023-10-09: 4 mg via INTRAVENOUS

## 2023-10-09 MED ORDER — EPHEDRINE SULFATE-NACL 50-0.9 MG/10ML-% IV SOSY
PREFILLED_SYRINGE | INTRAVENOUS | Status: DC | PRN
  Administered 2023-10-09: 5 mg via INTRAVENOUS
  Administered 2023-10-09: 10 mg via INTRAVENOUS

## 2023-10-09 MED ORDER — ACETAMINOPHEN 10 MG/ML IV SOLN: 1000.0000 mg | Freq: Once | INTRAVENOUS | Status: DC | PRN

## 2023-10-09 MED ORDER — CHLORHEXIDINE GLUCONATE 0.12 % MT SOLN
15.0000 mL | Freq: Once | OROMUCOSAL | Status: AC
  Administered 2023-10-09: 15 mL via OROMUCOSAL

## 2023-10-09 MED ORDER — ROCURONIUM BROMIDE 10 MG/ML (PF) SYRINGE
PREFILLED_SYRINGE | INTRAVENOUS | Status: DC | PRN
  Administered 2023-10-09: 30 mg via INTRAVENOUS

## 2023-10-09 MED ORDER — CEFAZOLIN SODIUM-DEXTROSE 2-4 GM/100ML-% IV SOLN
2.0000 g | INTRAVENOUS | Status: AC
  Administered 2023-10-09: 2 g via INTRAVENOUS
  Filled 2023-10-09: qty 100

## 2023-10-09 MED ORDER — TRANEXAMIC ACID-NACL 1000-0.7 MG/100ML-% IV SOLN
1000.0000 mg | INTRAVENOUS | Status: AC
  Administered 2023-10-09: 1000 mg via INTRAVENOUS
  Filled 2023-10-09: qty 100

## 2023-10-09 MED ORDER — EPHEDRINE 5 MG/ML INJ
INTRAVENOUS | Status: AC
  Filled 2023-10-09: qty 5

## 2023-10-09 MED ORDER — LIDOCAINE 2% (20 MG/ML) 5 ML SYRINGE
INTRAMUSCULAR | Status: DC | PRN
  Administered 2023-10-09: 40 mg via INTRAVENOUS

## 2023-10-09 MED ORDER — FENTANYL CITRATE (PF) 100 MCG/2ML IJ SOLN
INTRAMUSCULAR | Status: AC
  Filled 2023-10-09: qty 2

## 2023-10-09 MED ORDER — ORAL CARE MOUTH RINSE: 15.0000 mL | Freq: Once | OROMUCOSAL | Status: AC

## 2023-10-09 MED ORDER — LACTATED RINGERS IV SOLN: INTRAVENOUS | Status: DC

## 2023-10-09 MED ORDER — STERILE WATER FOR IRRIGATION IR SOLN
Status: DC | PRN
  Administered 2023-10-09: 2000 mL

## 2023-10-09 MED ORDER — AMISULPRIDE (ANTIEMETIC) 5 MG/2ML IV SOLN: 10.0000 mg | Freq: Once | INTRAVENOUS | Status: DC | PRN

## 2023-10-09 MED ORDER — BUPIVACAINE HCL (PF) 0.5 % IJ SOLN
INTRAMUSCULAR | Status: DC | PRN
Start: 2023-10-09 — End: 2023-10-09
  Administered 2023-10-09: 15 mL via PERINEURAL

## 2023-10-09 MED ORDER — VANCOMYCIN HCL 1 G IV SOLR
INTRAVENOUS | Status: DC | PRN
Start: 2023-10-09 — End: 2023-10-09
  Administered 2023-10-09: 1000 mg via TOPICAL

## 2023-10-09 MED ORDER — BUPIVACAINE-EPINEPHRINE (PF) 0.25% -1:200000 IJ SOLN
INTRAMUSCULAR | Status: DC | PRN
  Administered 2023-10-09: 14 mL

## 2023-10-09 MED ORDER — SUGAMMADEX SODIUM 200 MG/2ML IV SOLN
INTRAVENOUS | Status: AC
  Filled 2023-10-09: qty 6

## 2023-10-09 MED ORDER — BUPIVACAINE LIPOSOME 1.3 % IJ SUSP
INTRAMUSCULAR | Status: DC | PRN
  Administered 2023-10-09: 10 mL via PERINEURAL

## 2023-10-09 MED ORDER — FENTANYL CITRATE PF 50 MCG/ML IJ SOSY
50.0000 ug | PREFILLED_SYRINGE | INTRAMUSCULAR | Status: DC
  Administered 2023-10-09: 50 ug via INTRAVENOUS
  Filled 2023-10-09: qty 2

## 2023-10-09 MED ORDER — VANCOMYCIN HCL 1000 MG IV SOLR
INTRAVENOUS | Status: AC
  Filled 2023-10-09: qty 20

## 2023-10-09 MED ORDER — FENTANYL CITRATE PF 50 MCG/ML IJ SOSY: 25.0000 ug | PREFILLED_SYRINGE | INTRAMUSCULAR | Status: DC | PRN

## 2023-10-09 MED ORDER — 0.9 % SODIUM CHLORIDE (POUR BTL) OPTIME
TOPICAL | Status: DC | PRN
  Administered 2023-10-09: 1000 mL

## 2023-10-09 MED ORDER — SUGAMMADEX SODIUM 200 MG/2ML IV SOLN
INTRAVENOUS | Status: DC | PRN
  Administered 2023-10-09: 200 mg via INTRAVENOUS

## 2023-10-09 MED ORDER — ROCURONIUM BROMIDE 10 MG/ML (PF) SYRINGE
PREFILLED_SYRINGE | INTRAVENOUS | Status: AC
  Filled 2023-10-09: qty 10

## 2023-10-09 MED ORDER — DEXAMETHASONE SODIUM PHOSPHATE 10 MG/ML IJ SOLN
INTRAMUSCULAR | Status: DC | PRN
  Administered 2023-10-09: 6 mg via INTRAVENOUS

## 2023-10-09 MED ORDER — ONDANSETRON HCL 4 MG/2ML IJ SOLN: 4.0000 mg | Freq: Once | INTRAMUSCULAR | Status: DC | PRN

## 2023-10-09 MED ORDER — PHENYLEPHRINE HCL-NACL 20-0.9 MG/250ML-% IV SOLN
INTRAVENOUS | Status: DC | PRN
  Administered 2023-10-09: 25 ug/min via INTRAVENOUS

## 2023-10-09 MED ORDER — PROPOFOL 10 MG/ML IV BOLUS
INTRAVENOUS | Status: DC | PRN
  Administered 2023-10-09: 100 mg via INTRAVENOUS

## 2023-10-09 MED ORDER — BUPIVACAINE-EPINEPHRINE (PF) 0.25% -1:200000 IJ SOLN
INTRAMUSCULAR | Status: AC
  Filled 2023-10-09: qty 30

## 2023-10-09 MED ORDER — FENTANYL CITRATE (PF) 100 MCG/2ML IJ SOLN
INTRAMUSCULAR | Status: DC | PRN
  Administered 2023-10-09 (×2): 50 ug via INTRAVENOUS

## 2023-10-09 MED ORDER — THROMBIN (RECOMBINANT) 5000 UNITS EX SOLR
CUTANEOUS | Status: AC
  Filled 2023-10-09: qty 5000

## 2023-10-09 MED ORDER — THROMBIN (RECOMBINANT) 5000 UNITS EX SOLR
CUTANEOUS | Status: DC | PRN
  Administered 2023-10-09: 5000 [IU] via TOPICAL

## 2023-10-09 SURGICAL SUPPLY — 62 items
BAG COUNTER SPONGE SURGICOUNT (BAG) IMPLANT
BAG ZIPLOCK 12X15 (MISCELLANEOUS) IMPLANT
BIT DRILL 1.6MX128 (BIT) IMPLANT
BIT DRILL 170X2.5X (BIT) IMPLANT
BLADE SAG 18X100X1.27 (BLADE) ×2 IMPLANT
CLSR STERI-STRIP ANTIMIC 1/2X4 (GAUZE/BANDAGES/DRESSINGS) IMPLANT
COVER BACK TABLE 60X90IN (DRAPES) ×2 IMPLANT
COVER SURGICAL LIGHT HANDLE (MISCELLANEOUS) ×2 IMPLANT
DRAPE INCISE IOBAN 66X45 STRL (DRAPES) ×2 IMPLANT
DRAPE POUCH INSTRU U-SHP 10X18 (DRAPES) ×2 IMPLANT
DRAPE SHEET LG 3/4 BI-LAMINATE (DRAPES) ×2 IMPLANT
DRAPE SURG ORHT 6 SPLT 77X108 (DRAPES) ×4 IMPLANT
DRAPE TOP 10253 STERILE (DRAPES) ×2 IMPLANT
DRAPE U-SHAPE 47X51 STRL (DRAPES) ×2 IMPLANT
DRSG ADAPTIC 3X8 NADH LF (GAUZE/BANDAGES/DRESSINGS) ×2 IMPLANT
DRSG AQUACEL AG ADV 3.5X10 (GAUZE/BANDAGES/DRESSINGS) ×2 IMPLANT
DURAPREP 26ML APPLICATOR (WOUND CARE) ×2 IMPLANT
ECCENTRIC EPIPHYSI MODULAR SZ1 (Trauma) IMPLANT
ELECT BLADE TIP CTD 4 INCH (ELECTRODE) ×2 IMPLANT
ELECT NDL TIP 2.8 STRL (NEEDLE) ×2 IMPLANT
ELECT NEEDLE TIP 2.8 STRL (NEEDLE) ×1 IMPLANT
ELECT PENCIL ROCKER SW 15FT (MISCELLANEOUS) ×2 IMPLANT
ELECT REM PT RETURN 15FT ADLT (MISCELLANEOUS) ×2 IMPLANT
FACESHIELD WRAPAROUND OR TEAM (MASK) ×2 IMPLANT
GAUZE PAD ABD 8X10 STRL (GAUZE/BANDAGES/DRESSINGS) ×2 IMPLANT
GAUZE SPONGE 4X4 12PLY STRL (GAUZE/BANDAGES/DRESSINGS) ×2 IMPLANT
GLENOSPHERE DELTA XTEND LAT 38 (Miscellaneous) IMPLANT
GLOVE BIOGEL PI IND STRL 7.5 (GLOVE) ×2 IMPLANT
GLOVE BIOGEL PI IND STRL 8.5 (GLOVE) ×2 IMPLANT
GLOVE ORTHO TXT STRL SZ7.5 (GLOVE) ×2 IMPLANT
GLOVE SURG ORTHO 8.5 STRL (GLOVE) ×2 IMPLANT
GOWN STRL REUS W/ TWL XL LVL3 (GOWN DISPOSABLE) ×4 IMPLANT
KIT BASIN OR (CUSTOM PROCEDURE TRAY) ×2 IMPLANT
KIT TURNOVER KIT A (KITS) ×2 IMPLANT
MANIFOLD NEPTUNE II (INSTRUMENTS) ×2 IMPLANT
METAGLENE DELTA EXTEND (Trauma) IMPLANT
NDL MAYO CATGUT SZ4 TPR NDL (NEEDLE) IMPLANT
NEEDLE MAYO CATGUT SZ4 (NEEDLE) IMPLANT
NS IRRIG 1000ML POUR BTL (IV SOLUTION) ×2 IMPLANT
PACK SHOULDER (CUSTOM PROCEDURE TRAY) ×2 IMPLANT
PIN GUIDE 1.2 (PIN) IMPLANT
PIN GUIDE GLENOPHERE 1.5MX300M (PIN) IMPLANT
PIN METAGLENE 2.5 (PIN) IMPLANT
RESTRAINT HEAD UNIVERSAL NS (MISCELLANEOUS) ×2 IMPLANT
SCREW LOCK 42 (Screw) IMPLANT
SCREW LOCK DELTA XTEND 4.5X30 (Screw) IMPLANT
SLING ARM FOAM STRAP LRG (SOFTGOODS) IMPLANT
SLING ARM FOAM STRAP MED (SOFTGOODS) IMPLANT
SPACER 38 PLUS 3 (Spacer) IMPLANT
SPIKE FLUID TRANSFER (MISCELLANEOUS) ×2 IMPLANT
SPONGE SURGIFOAM ABS GEL 100 (HEMOSTASIS) IMPLANT
STEM HUMERAL SZ8 STD (Stem) IMPLANT
STRIP CLOSURE SKIN 1/2X4 (GAUZE/BANDAGES/DRESSINGS) ×2 IMPLANT
SUT MNCRL AB 4-0 PS2 18 (SUTURE) ×2 IMPLANT
SUT VIC AB 0 CT1 36 (SUTURE) ×2 IMPLANT
SUT VIC AB 0 CT2 27 (SUTURE) ×2 IMPLANT
SUT VIC AB 2-0 CT1 TAPERPNT 27 (SUTURE) ×2 IMPLANT
SUTURE FIBERWR #2 38 T-5 BLUE (SUTURE) ×4 IMPLANT
SUTURE FIBERWR#2 38 REV NDL BL (SUTURE) IMPLANT
TOWEL GREEN STERILE FF (TOWEL DISPOSABLE) ×2 IMPLANT
TOWEL OR 17X26 10 PK STRL BLUE (TOWEL DISPOSABLE) ×2 IMPLANT
YANKAUER SUCT BULB TIP NO VENT (SUCTIONS) ×2 IMPLANT

## 2023-10-09 NOTE — Care Plan (Signed)
 Ortho Bundle Case Management Note  Patient Details  Name: Michele Bryan MRN: [id number] Date of Birth: [birth year]  Rt Reverse TSA on 10/09/23  DCP: Home with husband DME: No needs PT: HEP                   DME Arranged:  N/A DME Agency:  NA  HH Arranged:    HH Agency:     Additional Comments: Please contact me with any questions of if this plan should need to change.  Burnard Dross, Case Manager [facility] [phone]  Ext. [phone]   10/09/2023, 2:41 PM

## 2023-10-09 NOTE — Brief Op Note (Signed)
 10/09/2023  1:15 PM  PATIENT:  Michele Bryan  83 y.o. female  PRE-OPERATIVE DIAGNOSIS:  Unspecified rotator cuff tear or rupture of right shoulder, not specified as traumatic, end stage OA  POST-OPERATIVE DIAGNOSIS:  Unspecified rotator cuff tear or rupture of right shoulder, not specified as traumatic, end stage OA  PROCEDURE:  Procedure(s): ARTHROPLASTY, SHOULDER, TOTAL, REVERSE (Right) DePuy Xtend  SURGEON:  Surgeons and Role:    DEWAINE Kay Kemps, MD - Primary  PHYSICIAN ASSISTANT:   ASSISTANTS: Debby KATHEE Fireman, PA-C   ANESTHESIA:   regional and general  EBL:  100 mL   BLOOD ADMINISTERED:none  DRAINS: none   LOCAL MEDICATIONS USED:  MARCAINE      SPECIMEN:  No Specimen  DISPOSITION OF SPECIMEN:  N/A  COUNTS:  YES  TOURNIQUET:  * No tourniquets in log *  DICTATION: .Other Dictation: Dictation Number [id number]  PLAN OF CARE: Discharge to home after PACU  PATIENT DISPOSITION:  PACU - hemodynamically stable.   Delay start of Pharmacological VTE agent (>24hrs) due to surgical blood loss or risk of bleeding: not applicable

## 2023-10-09 NOTE — Anesthesia Postprocedure Evaluation (Signed)
 Anesthesia Post Note  Patient: Michele Bryan  Procedure(s) Performed: ARTHROPLASTY, SHOULDER, TOTAL, REVERSE (Right: Shoulder)     Patient location during evaluation: PACU Anesthesia Type: Regional and General Level of consciousness: awake Pain management: pain level controlled Vital Signs Assessment: post-procedure vital signs reviewed and stable Respiratory status: spontaneous breathing, nonlabored ventilation and respiratory function stable Cardiovascular status: blood pressure returned to baseline and stable Postop Assessment: no apparent nausea or vomiting Anesthetic complications: no   No notable events documented.  Last Vitals:  Vitals:   10/09/23 1415 10/09/23 1430  BP: 126/87 115/64  Pulse:    Resp: 20 18  Temp:    SpO2: 94% 94%    Last Pain:  Vitals:   10/09/23 1430  TempSrc:   PainSc: 0-No pain                 Sian Joles P Marlana Mckowen

## 2023-10-09 NOTE — Anesthesia Procedure Notes (Signed)
 Procedure Name: Intubation Date/Time: 10/09/2023 11:40 AM  Performed by: Vincenzo Show, CRNAPre-anesthesia Checklist: Patient identified, Emergency Drugs available, Suction available, Patient being monitored and Timeout performed Patient Re-evaluated:Patient Re-evaluated prior to induction Oxygen Delivery Method: Circle system utilized Preoxygenation: Pre-oxygenation with 100% oxygen Induction Type: IV induction Ventilation: Mask ventilation without difficulty Laryngoscope Size: Glidescope and 3 Grade View: Grade II Tube type: Oral Tube size: 6.5 mm Number of attempts: 1 Airway Equipment and Method: Stylet Placement Confirmation: ETT inserted through vocal cords under direct vision, positive ETCO2, CO2 detector and breath sounds checked- equal and bilateral Secured at: 22 cm Tube secured with: Tape Dental Injury: Teeth and Oropharynx as per pre-operative assessment  Difficulty Due To: Difficulty was anticipated and Difficult Airway- due to reduced neck mobility Comments: ATOI, easy mask, minimal neck movement, elected to start with Glidescope.

## 2023-10-09 NOTE — Anesthesia Procedure Notes (Signed)
 Anesthesia Regional Block: Interscalene brachial plexus block   Pre-Anesthetic Checklist: , timeout performed,  Correct Patient, Correct Site, Correct Laterality,  Correct Procedure, Correct Position, site marked,  Risks and benefits discussed,  Surgical consent,  Pre-op evaluation,  At surgeon's request and post-op pain management  Laterality: Right  Prep: chloraprep       Needles:  Injection technique: Single-shot  Needle Type: Echogenic Stimulator Needle     Needle Length: 9cm  Needle Gauge: 21     Additional Needles:   Procedures:,,,, ultrasound used (permanent image in chart),,    Narrative:  Start time: 10/09/2023 10:25 AM End time: 10/09/2023 10:35 AM Injection made incrementally with aspirations every 5 mL.  Performed by: Personally  Anesthesiologist: Patrisha Bernardino SQUIBB, MD  Additional Notes: Functioning IV was confirmed and monitors were applied.  A timeout was performed. Sterile prep, hand hygiene and sterile gloves were used. A 90mm 21ga Arrow echogenic stimulator needle was used. Negative aspiration and negative test dose prior to incremental administration of local anesthetic. The patient tolerated the procedure well.  Ultrasound guidance: relevent anatomy identified, needle position confirmed, local anesthetic spread visualized around nerve(s), vascular puncture avoided.  Image printed for medical record.

## 2023-10-09 NOTE — Evaluation (Addendum)
 Occupational Therapy Evaluation Patient Details Name: Michele Bryan MRN: [id number] DOB: [birth year] Today's Date: 10/09/2023   History of Present Illness   Michele Bryan is a 83 yr old female who is s/p a R reverse total shoulder arthroplasty on 10-09-23, due to rotator cuff tear and end stage OA.     Clinical Impressions Pt is s/p shoulder replacement of right dominant upper extremity on 10-09-23. Therapist provided education and instruction to patient and her spouse, son, and daughter-in-law with regards to ROM/exercise protocol, post-op precautions, UE and sling positioning, donning upper extremity clothing, use of ice for pain and edema management, NWB status, sling wear schedule, bathing while maintaining shoulder precautions, use of ice for pain and edema management, and donning/doffing sling. Patient presented with fair understanding, recall, and teach back abilities. Her family/caregivers presented with good understanding, recall, and teach back abilities.  Patient needed assistance to donn shirt, underwear, pants, and shoes, with instruction on compensatory strategies to perform ADLs. Patient to follow up with MD for further post-discharge needs and recommendations. OT will follow the pt for further services in the acute care setting, should she remain a patient.       If plan is discharge home, recommend the following:   Help with stairs or ramp for entrance;Assist for transportation;Assistance with cooking/housework;A lot of help with bathing/dressing/bathroom     Functional Status Assessment   Patient has had a recent decline in their functional status and demonstrates the ability to make significant improvements in function in a reasonable and predictable amount of time.     Equipment Recommendations   None recommended by OT     Recommendations for Other Services         Precautions/Restrictions   Precautions Precautions: Shoulder Shoulder Interventions: Shoulder  sling/immobilizer Precaution Booklet Issued: Yes (comment) Recall of Precautions/Restrictions: Intact Required Braces or Orthoses: Sling Restrictions Weight Bearing Restrictions Per Provider Order: Yes RUE Weight Bearing Per Provider Order: Non weight bearing Other Position/Activity Restrictions: wear sling at all times except ADLs/exercise, okay to perform elbow, wrist, and hand ROM, no shoulder ROM     Mobility Bed Mobility               General bed mobility comments: pt was found seated in chair    Transfers Overall transfer level: Needs assistance Equipment used: None Transfers: Sit to/from Stand Sit to Stand: Contact guard assist                  Balance Overall balance assessment: Mild deficits observed, not formally tested             ADL either performed or assessed with clinical judgement   ADL Overall ADL's : Needs assistance/impaired                 Upper Body Dressing : Maximal assistance;Sitting Upper Body Dressing Details (indicate cue type and reason): to donn around the back shirt and sling in sitting; OT instructed pt's spouse and daughter-in-law on proper technique to perform Lower Body Dressing: Moderate assistance;Sitting/lateral leans Lower Body Dressing Details (indicate cue type and reason): To donn pants, socks and shoes with pt seated in chair. OT instructed pt's spouse and daughter-in-law on proper technique to perform                      Pertinent Vitals/Pain Pain Assessment Pain Assessment: No/denies pain     Extremity/Trunk Assessment Upper Extremity Assessment Upper Extremity Assessment: Right hand dominant;LUE deficits/detail;RUE deficits/detail  LUE Deficits / Details: AROM and grip strength WFL   Lower Extremity Assessment Lower Extremity Assessment: Overall WFL for tasks assessed       Communication Communication Communication: No apparent difficulties   Cognition Arousal: Alert Behavior During  Therapy: WFL for tasks assessed/performed               OT - Cognition Comments: Oriented x4            Following commands: Intact             Shoulder Instructions Shoulder Instructions Donning/doffing shirt without moving shoulder: Maximal assistance Method for sponge bathing under operated UE:  (Caregiver and pt verbalized understanding) Donning/doffing sling/immobilizer: Maximal assistance Correct positioning of sling/immobilizer: Maximal assistance Pendulum exercises (written home exercise program):  (N/A) ROM for elbow, wrist and digits of operated UE:  (Caregiver and pt verbalized understanding) Sling wearing schedule (on at all times/off for ADL's):  (Caregivers verbalized understanding) Proper positioning of operated UE when showering:  (Caregivers verbalized understanding) Dressing change:  (Caregivers and pt verbalized understanding) Positioning of UE while sleeping:  (Caregivers and pt verbalized understanding)    Home Living Family/patient expects to be discharged to:: Private residence Living Arrangements: Spouse/significant other Available Help at Discharge: Family Type of Home: House       Home Layout: Two level;Able to live on main level with bedroom/bathroom     Bathroom Shower/Tub: Walk-in shower         Home Equipment: Administrator (4 wheels);Cane - single point          Prior Functioning/Environment Prior Level of Function : Independent/Modified Independent             Mobility Comments: Independent with ambulation. ADLs Comments: Independent with ADLs.    OT Problem List: Decreased range of motion;Impaired UE functional use   OT Treatment/Interventions: Self-care/ADL training;Therapeutic exercise;Therapeutic activities;DME and/or AE instruction;Patient/family education      OT Goals(Current goals can be found in the care plan section)   Acute Rehab OT Goals OT Goal Formulation: With patient/family Time  For Goal Achievement: 10/23/23 Potential to Achieve Goals: Good   OT Frequency:  Min 2X/week       AM-PAC OT 6 Clicks Daily Activity     Outcome Measure Help from another person eating meals?: None Help from another person taking care of personal grooming?: A Little Help from another person toileting, which includes using toliet, bedpan, or urinal?: A Little Help from another person bathing (including washing, rinsing, drying)?: A Little Help from another person to put on and taking off regular upper body clothing?: A Lot Help from another person to put on and taking off regular lower body clothing?: A Lot 6 Click Score: 17   End of Session Equipment Utilized During Treatment: Other (comment) (N/A) Nurse Communication: Other (comment) (shoulder education completed)  Activity Tolerance: Patient tolerated treatment well Patient left: in chair;with call bell/phone within reach;with family/visitor present  OT Visit Diagnosis: Muscle weakness (generalized) (M62.81)                Time: 8577-8551 OT Time Calculation (min): 26 min Charges:  OT General Charges $OT Visit: 1 Visit OT Evaluation $OT Eval Moderate Complexity: 1 Mod OT Treatments $Self Care/Home Management : 8-22 mins    Delanna LITTIE Molt, OTR/L 10/09/2023, 4:42 PM

## 2023-10-09 NOTE — Interval H&P Note (Signed)
 History and Physical Interval Note:  10/09/2023 9:34 AM  Michele Bryan  has presented today for surgery, with the diagnosis of Unspecified rotator cuff tear or rupture of right shoulder, not specified as traumatic.  The various methods of treatment have been discussed with the patient and family. After consideration of risks, benefits and other options for treatment, the patient has consented to  Procedure(s): ARTHROPLASTY, SHOULDER, TOTAL, REVERSE (Right) as a surgical intervention.  The patient's history has been reviewed, patient examined, no change in status, stable for surgery.  I have reviewed the patient's chart and labs.  Questions were answered to the patient's satisfaction.     [name]

## 2023-10-09 NOTE — Discharge Instructions (Signed)
 Ice to the shoulder constantly.  Keep the bandage on until Sunday then please change to the Aquacel bandage(waterproof), then ok to get it wet in the shower. Remove the Aquacel after on week and leave the incision open to air.  Do exercise as instructed several times per day.  DO NOT reach behind your back or push up out of a chair with the operative arm.  Use a sling while you are up and around for comfort, may remove while seated.  Keep pillow propped behind the operative elbow.  Follow up with Dr Kay in two weeks in the office, call [phone] for appt  Please call Dr Kay (cell) [phone] with any questions or concerns

## 2023-10-09 NOTE — Transfer of Care (Signed)
 Immediate Anesthesia Transfer of Care Note  Patient: Michele Bryan  Procedure(s) Performed: ARTHROPLASTY, SHOULDER, TOTAL, REVERSE (Right: Shoulder)  Patient Location: PACU  Anesthesia Type:GA combined with regional for post-op pain  Level of Consciousness: awake and alert   Airway & Oxygen Therapy: Patient Spontanous Breathing  Post-op Assessment: Report given to RN  Post vital signs: Reviewed and stable  Last Vitals:  Vitals Value Taken Time  BP 115/64 10/09/23 14:30  Temp 36.5 C 10/09/23 14:05  Pulse 82 10/09/23 14:05  Resp 18 10/09/23 14:30  SpO2 94 % 10/09/23 14:30    Last Pain:  Vitals:   10/09/23 1430  TempSrc:   PainSc: 0-No pain         Complications: No notable events documented.

## 2023-10-10 ENCOUNTER — Other Ambulatory Visit: Payer: Self-pay

## 2023-10-10 ENCOUNTER — Emergency Department (HOSPITAL_COMMUNITY)
Admission: EM | Admit: 2023-10-10 | Discharge: 2023-10-10 | Disposition: A | Discharge status: Home / Self Care | Attending: Emergency Medicine | Admitting: Emergency Medicine

## 2023-10-10 ENCOUNTER — Encounter (HOSPITAL_COMMUNITY): Payer: Self-pay

## 2023-10-10 DIAGNOSIS — Z7982 Long term (current) use of aspirin: Secondary | ICD-10-CM | POA: Diagnosis not present

## 2023-10-10 DIAGNOSIS — Z4801 Encounter for change or removal of surgical wound dressing: Secondary | ICD-10-CM | POA: Diagnosis not present

## 2023-10-10 DIAGNOSIS — L7622 Postprocedural hemorrhage and hematoma of skin and subcutaneous tissue following other procedure: Secondary | ICD-10-CM | POA: Diagnosis not present

## 2023-10-10 DIAGNOSIS — Z4800 Encounter for change or removal of nonsurgical wound dressing: Secondary | ICD-10-CM | POA: Diagnosis not present

## 2023-10-10 DIAGNOSIS — I10 Essential (primary) hypertension: Secondary | ICD-10-CM | POA: Diagnosis not present

## 2023-10-10 DIAGNOSIS — Z96611 Presence of right artificial shoulder joint: Secondary | ICD-10-CM | POA: Insufficient documentation

## 2023-10-10 DIAGNOSIS — Z5189 Encounter for other specified aftercare: Secondary | ICD-10-CM

## 2023-10-10 LAB — BASIC METABOLIC PANEL WITH GFR
Anion gap: 8 (ref 5–15)
BUN: 23 mg/dL (ref 8–23)
CO2: 24 mmol/L (ref 22–32)
Calcium: 8.7 mg/dL — ABNORMAL LOW (ref 8.9–10.3)
Chloride: 106 mmol/L (ref 98–111)
Creatinine, Ser: 0.86 mg/dL (ref 0.44–1.00)
GFR, Estimated: >60 mL/min (ref >60)
Glucose, Bld: 134 mg/dL — ABNORMAL HIGH (ref 70–99)
Potassium: 3.7 mmol/L (ref 3.5–5.1)
Sodium: 138 mmol/L (ref 135–145)

## 2023-10-10 LAB — CBC
HCT: 27 % — ABNORMAL LOW (ref 36.0–46.0)
Hemoglobin: 8.3 g/dL — ABNORMAL LOW (ref 12.0–15.0)
MCH: 30.5 pg (ref 26.0–34.0)
MCHC: 30.7 g/dL (ref 30.0–36.0)
MCV: 99.3 fL (ref 80.0–100.0)
Platelets: 188 K/uL (ref 150–400)
RBC: 2.72 MIL/uL — ABNORMAL LOW (ref 3.87–5.11)
RDW: 14.3 % (ref 11.5–15.5)
WBC: 6.9 K/uL (ref 4.0–10.5)
nRBC: 0 % (ref 0.0–0.2)

## 2023-10-10 MED ORDER — HYDROCODONE-ACETAMINOPHEN 5-325 MG PO TABS
1.0000 | ORAL_TABLET | Freq: Four times a day (QID) | ORAL | 0 refills | Status: AC | PRN
Start: 2023-10-10 — End: ?

## 2023-10-10 MED ORDER — ACETAMINOPHEN 325 MG PO TABS
650.0000 mg | ORAL_TABLET | Freq: Once | ORAL | Status: AC
  Administered 2023-10-10: 650 mg via ORAL
  Filled 2023-10-10: qty 2

## 2023-10-10 NOTE — ED Provider Notes (Signed)
 [facility] AT [facility] Provider Note   CSN: [id number] Arrival date & time: 10/10/23  0111     Patient presents with: Post-op Problem   Michele Bryan is a 83 y.o. female with history of anemia, fibromyalgia, GERD, multiple sclerosis, status post right total reverse shoulder arthroplasty with Dr. Kay of [facility] today.  Patient presents to ED for evaluation of postop bleeding.  States that she had surgery today, was discharged by 2 PM.  Reports that she went home in her usual state of health.  States that around bedtime when she was changing she noticed that her bandaging to her right shoulder was bleeding, striking through.  She states that she had 1 layer of gauze and then 1 occlusive dressing on top of this.  Reports that the bleeding bled through gauze but denies tracking through top bandaging.  She reports that she does not take blood thinners, she takes aspirin  but has not taken any since 8/3.  Her husband reports that staff at day surgery center stated that patient was a bleeder.  Patient denies any pain to her right arm, states that she was given nerve block.  Denies lightheadedness, dizziness, weakness, shortness of breath.   HPI     Prior to Admission medications   Medication Sig Start Date End Date Taking? Authorizing Provider  HYDROcodone -acetaminophen  (NORCO/VICODIN) 5-325 MG tablet Take 1 tablet by mouth every 6 (six) hours as needed for severe pain (pain score 7-10). 10/10/23  Yes Ruthell Lonni FALCON, PA-C  acetaminophen  (TYLENOL ) 650 MG CR tablet Take 1,300 mg by mouth every 12 (twelve) hours as needed for pain or fever. Patient not taking: Reported on 09/25/2023    [provider]  amLODipine  (NORVASC ) 5 MG tablet Take 1 tablet (5 mg total) by mouth daily. 02/29/20   Angiulli, Toribio PARAS, PA-C  aspirin  EC 81 MG tablet Take 81 mg by mouth daily. Swallow whole.    [provider]  baricitinib  (OLUMIANT ) tablet Take 1  tablet (2 mg total) by mouth daily. 09/14/23   Cheryl Waddell CHRISTELLA, PA-C  Cyanocobalamin  (VITAMIN B-12) 5000 MCG TBDP Take 5,000 mcg by mouth daily. 02/29/20   Angiulli, Toribio PARAS, PA-C  Docusate Sodium  (DSS) 100 MG CAPS Take 100 mg by mouth daily as needed (constipation).    [provider]  furosemide  (LASIX ) 20 MG tablet Take 1 tablet (20 mg total) by mouth daily as needed (as needed for swelling). 08/17/20   Bensimhon, Toribio SAUNDERS, MD  gabapentin  (NEURONTIN ) 100 MG capsule Take 200 mg by mouth 3 (three) times daily.    [provider]  hydroxychloroquine  (PLAQUENIL ) 200 MG tablet TAKE ONE TABLET BY MOUTH ONCE A DAY 08/11/23   Cheryl Waddell CHRISTELLA, PA-C  hyoscyamine  (LEVBID ) 0.375 MG 12 hr tablet Take 0.375 mg by mouth every 12 (twelve) hours as needed for cramping.    [provider]  losartan  (COZAAR ) 50 MG tablet Take 50 mg by mouth 2 (two) times daily.    [provider]  Multiple Vitamin (MULTIVITAMINS PO) Take 1 tablet by mouth daily.    [provider]  nystatin (MYCOSTATIN/NYSTOP) powder Apply 1 Application topically 2 (two) times daily. 09/19/23   [provider]  omeprazole  (PRILOSEC) 40 MG capsule Take 1 capsule (40 mg total) by mouth daily. 02/29/20   Angiulli, Daniel J, PA-C  polyethylene glycol (MIRALAX  / GLYCOLAX ) 17 g packet Take 17 g by mouth daily as needed (constipation).    [provider]  potassium chloride  SA (KLOR-CON ) 20 MEQ tablet Take 1 tablet (20 mEq total) by mouth as needed (when you take Furosemide ). 08/17/20   Bensimhon, Toribio SAUNDERS, MD  rosuvastatin  (CRESTOR ) 5 MG tablet Take 5 mg by mouth daily.    [provider]  traMADol  (ULTRAM ) 50 MG tablet TAKE 1 TABLET BY MOUTH EVERY 6 HOURS AS NEEDED FOR UP TO 5 DAYS 06/24/22   Standiford, Marsa FALCON, DPM  traZODone  (DESYREL ) 50 MG tablet Take 25 mg by mouth at bedtime. 09/17/23   [provider]  vitamin E  180 MG (400 UNITS) capsule Take 400 Units by mouth daily.     [provider]    Allergies: Shellfish allergy, Codeine, and Demerol  [meperidine ]    Review of Systems  All other systems reviewed and are negative.   Updated Vital Signs BP 139/82 (BP Location: Left Arm)   Pulse 90   Temp 98.4 F (36.9 C) (Oral)   Resp 16   SpO2 98%   Physical Exam Vitals and nursing note reviewed.  Constitutional:      General: She is not in acute distress.    Appearance: She is well-developed.  HENT:     Head: Normocephalic and atraumatic.  Eyes:     Conjunctiva/sclera: Conjunctivae normal.  Cardiovascular:     Rate and Rhythm: Normal rate and regular rhythm.     Heart sounds: No murmur heard. Pulmonary:     Effort: Pulmonary effort is normal. No respiratory distress.     Breath sounds: Normal breath sounds.  Abdominal:     Palpations: Abdomen is soft.     Tenderness: There is no abdominal tenderness.  Musculoskeletal:        General: No swelling.     Cervical back: Neck supple.     Comments: Postop incision to right upper chest.  Steri-Strips in place with strikethrough bleeding.  Ecchymosis to distal bicep.  See photo.  Skin:    General: Skin is warm and dry.     Capillary Refill: Capillary refill takes less than 2 seconds.  Neurological:     Mental Status: She is alert.  Psychiatric:        Mood and Affect: Mood normal.       (all labs ordered are listed, but only abnormal results are displayed) Labs Reviewed  CBC - Abnormal; Notable for the following components:      Result Value   RBC 2.72 (*)    Hemoglobin 8.3 (*)    HCT 27.0 (*)    All other components within normal limits  BASIC METABOLIC PANEL WITH GFR - Abnormal; Notable for the following components:   Glucose, Bld 134 (*)    Calcium  8.7 (*)    All other components within normal limits    EKG: EKG Interpretation Date/Time:  Saturday October 10 2023 02:03:23 EDT Ventricular Rate:  77 PR Interval:  136 QRS Duration:  80 QT Interval:  395 QTC  Calculation: 447 R Axis:   32  Text Interpretation: Sinus rhythm Normal ECG When compared with ECG of 09/28/2023, No significant change was found Confirmed by Raford Lenis ([id number]) on 10/10/2023 2:45:19 AM  Radiology: [facility] Shoulder Right Port Result Date: 10/09/2023 CLINICAL DATA:  068512 S/P shoulder replacement, right 068512. EXAM: RIGHT SHOULDER - 1 VIEW COMPARISON:  None Available. FINDINGS: No acute fracture or dislocation. No aggressive osseous lesion. The acromioclavicular joint is normal in alignment. Mild degenerative changes. Baseline examination status post right reverse shoulder arthroplasty The hardware is intact. No  periprosthetic fracture or lucency. There is near anatomic alignment. No soft tissue swelling. No radiopaque foreign bodies. IMPRESSION: Baseline examination status post right reverse shoulder arthroplasty. No evidence of periprosthetic fracture or lucency. Electronically Signed   By: Ree Molt M.D.   On: 10/09/2023 13:52     Procedures   Medications Ordered in the ED  acetaminophen  (TYLENOL ) tablet 650 mg (650 mg Oral Given 10/10/23 0207)     Medical Decision Making Amount and/or Complexity of Data Reviewed Labs: ordered.   This is an 83 year old female who presents to the ED after having reverse arthroplasty of right shoulder with Dr. Mariea orthopedics earlier today.  Patient presents due to ongoing bleeding, concern for blood loss.  On examination the patient is afebrile and nontachycardic.  She is alert and oriented x 4.  Her lung sounds are clear bilaterally, she is not hypoxic.  Abdomen is soft and compressible.  Neuroexam at baseline.  Right shoulder with above findings.  On my exam, there is no active bleeding from the patient incision site.  Will draw labs to assess hemoglobin.  Also draw metabolic panel.  EKG also collected.  Given 650mg  tylenol  for pain.   Patient metabolic panel reveals hemoglobin 8.3.  Could be secondary to hemodilution as patient  received IV fluids today for operation.  Also could be related to blood loss due to surgery.  No active bleeding at this time.  Metabolic panel unremarkable.  EKG nonischemic.  Patient wound was redressed at this time.  She was advised to follow-up with her orthopedic surgeon, PCP for recheck of hemoglobin.  Husband and patient were advised that if patient continues to have bleeding and there is strikethrough to return to the ED for further care and management.  They voiced understanding.  Patient was also seen in conjunction with attending Dr. Raford.  Voices agreement with plan of management.  Return precautions provided and they voiced understanding.  Stable to discharge.    Final diagnoses:  Visit for wound check    ED Discharge Orders          Ordered    HYDROcodone -acetaminophen  (NORCO/VICODIN) 5-325 MG tablet  Every 6 hours PRN        10/10/23 0321               Ruthell Lonni FALCON, PA-C 10/10/23 0410    Raford Lenis, MD 10/10/23 9265    Raford Lenis, MD 10/10/23 [phone]

## 2023-10-10 NOTE — Op Note (Unsigned)
 NAME: Michele Bryan, Michele Bryan MEDICAL RECORD NO: [id number] ACCOUNT NO: [id number] DATE OF BIRTH: 15-Sep-1940 FACILITY: [facility] LOCATION: [facility] PHYSICIAN: Elspeth SAUNDERS. 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  PROCEDURE PERFORMED:  Right reverse total shoulder arthroplasty using DePuy Delta Xtend prosthesis with no subscapularis repair.  ATTENDING SURGEON:  Elspeth SAUNDERS. Kay, MD.  ASSISTANT:  Debby Crock Dixon, [place], who was scrubbed during the entire procedure, and necessary for satisfactory completion of surgery.  ANESTHESIA:  General anesthesia was used plus interscalene block.  ESTIMATED BLOOD LOSS:  100 mL.  FLUID REPLACEMENT:  1000 mL crystalloid.  COUNTS:  Instrument count was correct.  COMPLICATIONS:  No complications.  ANTIBIOTICS:  Perioperative antibiotics were given.  INDICATIONS:  The patient is an 83 year old female who presents with worsening right shoulder pain due to end-stage rotator cuff tear arthropathy.  The patient has a large shoulder effusion and failed an extended period of conservative management.  She  presents for reverse TSA to eliminate pain and restore function. Informed consent obtained.  DESCRIPTION OF PROCEDURE:  After an adequate level of general anesthesia was achieved plus interscalene block, the patient was positioned in modified beach chair position. The right shoulder was correctly identified. Sterile prep and drape were  performed. Timeout called verifying correct patient and correct site.  We entered the patient's shoulder using a standard deltopectoral incision starting at the coracoid process and extending down to the anterior humerus.  Dissection down through  subcutaneous tissues using Bovie. Cephalic vein identified and taken laterally at the deltoid.  Pectoralis  was taken medially. Conjoined tendon was identified and retracted medially. Deep retractor was placed.  We tenodesed the biceps in situ with 0  Vicryl figure-of-eight suture x2 incorporating part of the pectoralis. We then released the subscapularis remnant off the lesser tuberosity intact for protection of the axillary nerve.  Next, we extended the shoulder and delivered the humerus out of the  wound. We noted there to be full-thickness cartilage loss with eburnated bone. We entered the proximal humerus with a 6 mm reamer, reamed up to a size 8.  The humerus tapered down, so we only went with the 8 reamer.  We then placed an 8 mm T-handled  guide and resected the head at 20 degrees of retroversion with the oscillating saw.  We then removed osteophytes with a rongeur.  Next, we subluxed the humerus posteriorly.  We had good exposure of the glenoid face.  We removed the labrum and the  capsule.  We evacuated multiple loose bodies from the subscapularis recess. We also evacuated the effusion.  With good exposure, we placed our guide pin centered low in the glenoid, reamed to subchondral bone for the metaglene baseplate. Next, we did our  peripheral hand reaming with a T-handle reamer. Next, we drilled out our central peg hole and then impacted the HA-coated press-fit baseplate into position. We were able to get a 42 screw inferiorly and a 30 screw superiorly.  Both screws with good  purchase and the baseplate was secured and supported. We irrigated again and then we placed the 38 +0 standard glenosphere onto the baseplate and secured with the screwdriver.  Next, I did a finger sweep to make sure we had no soft tissue caught up  between the baseplate and the glenosphere.  We then went back to the humeral side and  reamed for the one right metaphysis.  We then trialed with the 8 stem and the one right metaphysis set in the 0 setting and placed in 20 degrees of retroversion.  We  reduced with a 38 +3 poly trial  and we were happy with her soft tissue balance and stability.  We removed all trial components.  We irrigated thoroughly.  We then used a viable bone graft from the humeral head and impaction grafting technique and  impacted the Porocoated 8 stem, press-fit, and also the one right metaphysis set in the 0 setting and placed in 20 degrees of retroversion. With the stem secured, we selected real 38 +3 poly, impacted on the humeral tray, and reduced the shoulder.   Again, nice little pop was reduced and appropriate soft tissue tension and stability, not overtight. We irrigated thoroughly and then went ahead and resected the subscapularis remnant.  I then placed 1 g of vancomycin  powder inside the wound and around  the implant. We then closed the deltopectoral interval with 0 Vicryl suture followed by 2-0 Vicryl for subcutaneous closure and 4-0 Monocryl for skin.  Steri-Strips applied followed by a sterile dressing.  The patient tolerated the surgery well.    NIK D: 10/09/2023 1:22:05 pm T: 10/10/2023 3:45:00 am  JOB: [id number]/ [id number]

## 2023-10-10 NOTE — ED Triage Notes (Signed)
 Pt. Arrives for bleeding following a right shoulder replacement surgery. Pt. Bled through the initial dressing that was placed after surgery. They were told not to take it off, but they had to since the bleeding was so bad. Pt. Denies pain at this time. Pt.  Stopped taking her blood thinners for surgery on 10/04/2023

## 2023-10-10 NOTE — Discharge Instructions (Signed)
 It was a pleasure taking part in your care.  As discussed, your workup here does show that your hemoglobin is reduced however this is most likely related to hemodilution as we discussed.  There is no active bleeding at this time.  Please have the patient reevaluated by her orthopedic surgeon this week.  Please follow-up with PCP for recheck of hemoglobin.  If patient continues to have bleeding, strikethrough to bandaging, please return to ED for further care.

## 2023-10-10 NOTE — ED Notes (Signed)
 Post op site dressed with dry gauze and loose coband

## 2023-10-12 ENCOUNTER — Encounter (HOSPITAL_COMMUNITY): Payer: Self-pay | Admitting: Orthopedic Surgery

## 2023-10-22 DIAGNOSIS — Z4889 Encounter for other specified surgical aftercare: Secondary | ICD-10-CM | POA: Diagnosis not present

## 2023-10-31 ENCOUNTER — Other Ambulatory Visit: Payer: Self-pay | Admitting: Physician Assistant

## 2023-10-31 DIAGNOSIS — M0579 Rheumatoid arthritis with rheumatoid factor of multiple sites without organ or systems involvement: Secondary | ICD-10-CM

## 2023-11-03 NOTE — Telephone Encounter (Signed)
 Last Fill: 08/11/2023  Eye exam: 11/25/2022 WNL   Labs: 10/10/2023 RBC 2.72 Hemoglobin 8.3 HCT 27.0 Glucose 134 [place] 8.7 09/17/2023 CMP BUN 38 Creat 1.10 eGFR 51 BUN/Creatinine 35 Total Protein 6.0 Globulin 1.7  Next Visit: 11/18/2023  Last Visit: 08/10/2023  DX: Rheumatoid arthritis involving multiple sites with positive rheumatoid factor   Current Dose per office note 08/10/2023: plaquenil  200 mg 1 tablet by mouth daily   Okay to refill Plaquenil ?   Contacted patient to confirm if patient was taking the medication she stated she is and confirmed 1 tablets by mouth daily

## 2023-11-03 NOTE — Telephone Encounter (Signed)
 Attempted to contact the patient and left a message to call the office back. Need to inquire if the patient is still taking the Plaquenil  and taking it as prescribed.

## 2023-11-06 NOTE — Progress Notes (Signed)
 Office Visit Note  Patient: Michele Bryan             Date of Birth: [birth year]           MRN: [id number]             PCP: Cleotilde Planas, MD Referring: Cleotilde Planas, MD Visit Date: 11/18/2023 Occupation: @GUAROCC @  Subjective:  Medication management  History of Present Illness: Michele Bryan is a 83 y.o. female with seropositive rheumatoid arthritis, osteoarthritis, degenerative disc disease and osteoporosis.  She returns today after her last visit in June 2025.  She denies having a flare of rheumatoid arthritis.  She has been on Olumiant  2 mg p.o. daily and hydroxychloroquine  200 mg p.o. daily.  She was off Olumiant  for 2 weeks for the right total shoulder replacement.  She underwent right reverse total shoulder replacement on October 09, 2023 by Dr. Mariea.  She had excellent results.  She continues to have some stiffness in her neck but no discomfort.  She denies any lower back pain.  She continues to have some stiffness in her hands and feet but no swelling.  She had last Reclast  infusion in June 2025.  She has been taking calcium  and vitamin D .  She walks some for exercise.    Activities of Daily Living:  Patient reports morning stiffness for all day. Patient Reports nocturnal pain.  Difficulty dressing/grooming: Denies Difficulty climbing stairs: Reports Difficulty getting out of chair: Denies Difficulty using hands for taps, buttons, cutlery, and/or writing: Reports  Review of Systems  Constitutional:  Negative for fatigue.  HENT:  Negative for mouth sores and mouth dryness.   Eyes:  Negative for dryness.  Respiratory:  Negative for shortness of breath.   Cardiovascular:  Positive for swelling in legs/feet. Negative for chest pain and palpitations.  Gastrointestinal:  Positive for constipation and diarrhea. Negative for blood in stool.  Endocrine: Positive for increased urination.  Genitourinary:  Positive for involuntary urination.  Musculoskeletal:  Positive for joint pain,  gait problem, joint pain, myalgias, muscle weakness, morning stiffness, muscle tenderness and myalgias. Negative for joint swelling.  Skin:  Positive for rash. Negative for color change, hair loss and sensitivity to sunlight.  Allergic/Immunologic: Negative for susceptible to infections.  Neurological:  Negative for dizziness and headaches.  Hematological:  Negative for swollen glands.  Psychiatric/Behavioral:  Negative for depressed mood and sleep disturbance. The patient is not nervous/anxious.     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   Gait abnormality 03/08/2020   Postoperative pain    Benign essential HTN    Constipation    Anemia of chronic disease    Essential hypertension    Orthostatic hypotension    Cervical myelopathy (HCC) 02/21/2020   Excoriation of ear canal, left, initial encounter 01/17/2020   Impacted cerumen of left ear 01/17/2020   Closed cervical spine fracture (HCC) 12/06/2019   Scapula fracture 03/05/2018   Fracture of acromial process of scapula, closed 02/15/2018   Chronic left shoulder pain 11/25/2017   Left cervical radiculopathy 09/29/2017   S/P shoulder replacement, left 05/15/2017   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  05/06/2016   High risk medication use 05/06/2016   Primary osteoarthritis of both hands 05/06/2016   Primary osteoarthritis of left knee 05/06/2016   Primary osteoarthritis of both  feet 05/06/2016   Dyslipidemia 05/06/2016   Peripheral neuropathy 05/06/2016   Diverticulosis of intestine without bleeding 05/06/2016   Osteopenia  05/06/2016   Vitamin D  deficiency 05/06/2016   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   History of GI bleed 05/06/2016   Cataract of both eyes 05/06/2016   Degenerative joint disease of hand  05/06/2016   Osteoarthritis of foot joint 05/06/2016   Urinary urgency 04/30/2015   Chronic constipation 04/30/2015   MS (multiple sclerosis) (HCC) 03/23/2013   Expected blood loss anemia 06/16/2012   S/P right TKA 06/15/2012    Past Medical History:  Diagnosis Date   Anemia    Arthritis    Fibromyalgia    GERD (gastroesophageal reflux disease)    History of blood transfusion    Hyperlipidemia    Hypertension    Lower extremity edema    MS (multiple sclerosis) (HCC)    Neuromuscular disorder (HCC)    multiple scleroosis/peripheral neuropathy   PONV (postoperative nausea and vomiting)    Rheumatoid arthritis (HCC)    Ulcer    Vitamin D  deficiency     Family History  Problem Relation Age of Onset   Heart attack Father    Breast cancer Neg Hx    Past Surgical History:  Procedure Laterality Date   APPLICATION OF INTRAOPERATIVE CT SCAN N/A 02/16/2020   Procedure: APPLICATION OF INTRAOPERATIVE CT SCAN;  Surgeon: Cheryle Debby LABOR, MD;  Location: [facility] OR;  Service: Neurosurgery;  Laterality: N/A;   AUGMENTATION MAMMAPLASTY     BACK SURGERY     breast augumentation     BREAST SURGERY     biopsy   COLONOSCOPY     DILATION AND CURETTAGE OF UTERUS     EYE SURGERY     both eys,cataracts   goiter     HALO APPLICATION N/A 02/16/2020   Procedure: HALO TRACTION APPLICATION;  Surgeon: Cheryle Debby LABOR, MD;  Location: [facility] OR;  Service: Neurosurgery;  Laterality: N/A;   JOINT REPLACEMENT     KNEE ARTHROSCOPY Right    ORIF SHOULDER FRACTURE Left 03/05/2018   Procedure: OPEN REDUCTION INTERNAL FIXATION (ORIF) LEFT SCAPULA;  Surgeon: Kay Kemps, MD;  [facility] OR;  Service: Orthopedics;  Laterality: Left;   partial toe amputation Right 05/16/2022   2nd toe   POSTERIOR CERVICAL FUSION/FORAMINOTOMY N/A 12/06/2019   Procedure: Cervical one-two Posterior instrumented fusion;  Surgeon: Cheryle Debby LABOR, MD;  Location: [facility] OR;  Service: Neurosurgery;  Laterality: N/A;   POSTERIOR  CERVICAL FUSION/FORAMINOTOMY N/A 02/13/2020   Procedure: Revision of cervical instrumented fusion with Occiput to Cervical Four posterior instrumented fusion;  Surgeon: Cheryle Debby LABOR, MD;  Location: [facility] OR;  Service: Neurosurgery;  Laterality: N/A;  posterior   POSTERIOR CERVICAL FUSION/FORAMINOTOMY N/A 02/16/2020   Procedure: Occiput to Thoracic Two Posterior Cervical Fusion with AIRO and Application of Halo;  Surgeon: Cheryle Debby LABOR, MD;  Location: [facility] OR;  Service: Neurosurgery;  Laterality: N/A;   REVERSE SHOULDER ARTHROPLASTY Left 05/15/2017   Procedure: LEFT REVERSE SHOULDER ARTHROPLASTY;  Surgeon: Kay Kemps, MD;  Location: [facility] OR;  Service: Orthopedics;  Laterality: Left;   REVERSE SHOULDER ARTHROPLASTY Left 10/11/2018   Procedure: left shoulder irrigation and debridement, open poly exchange and removal of painful hardware;  Surgeon: Kay Kemps, MD;  Location: [facility] ORS;  Service: Orthopedics;  Laterality: Left;   REVERSE SHOULDER ARTHROPLASTY Right 10/09/2023   Procedure: ARTHROPLASTY, SHOULDER, TOTAL, REVERSE;  Surgeon: Kay Kemps, MD;  Location: [facility] ORS;  Service: Orthopedics;  Laterality: Right;   SHOULDER HEMI-ARTHROPLASTY Left 12/11/2020   Procedure: Left reverse shoulder conversion to SHOULDER HEMI-ARTHROPLASTY;  Surgeon: Kay Kemps, MD;  Location: [facility] ORS;  Service: Orthopedics;  Laterality: Left;  with ISB   THYROID  LOBECTOMY     TOTAL KNEE ARTHROPLASTY Right 06/15/2012   Procedure: RIGHT TOTAL KNEE ARTHROPLASTY;  Surgeon: Donnice JONETTA Car, MD;  Location: [facility] ORS;  Service: Orthopedics;  Laterality: Right;   WRIST SURGERY Left    Social History   Social History Narrative   Patient lives at home with her husband VERNICE).   Retired.   Education- college.   Caffeine- Two cups of decaf tea daily.   Right handed.         Immunization History  Administered Date(s) Administered   INFLUENZA, HIGH DOSE SEASONAL PF 12/03/2013   Influenza,inj,quad, With Preservative  11/23/2014   Influenza-Unspecified 12/19/2017   PFIZER(Purple Top)SARS-COV-2 Vaccination 04/16/2019, 05/29/2019, 01/09/2020   Td 12/15/2017   Td (Adult),5 Lf Tetanus Toxid, Preservative Free 12/15/2017   Zoster Recombinant(Shingrix) 08/10/2017, 01/05/2018   Zoster, Unspecified 08/10/2017, 01/05/2018     Objective: Vital Signs: BP 126/70   Pulse 67   Temp 98.1 F (36.7 C)   Resp 13   Ht 5' 3 (1.6 m)   Wt 121 lb 3.2 oz (55 kg)   BMI 21.47 kg/m    Physical Exam Vitals and nursing note reviewed.  Constitutional:      Appearance: She is well-developed.  HENT:     Head: Normocephalic and atraumatic.  Eyes:     Conjunctiva/sclera: Conjunctivae normal.  Cardiovascular:     Rate and Rhythm: Normal rate and regular rhythm.     Heart sounds: Normal heart sounds.  Pulmonary:     Effort: Pulmonary effort is normal.     Breath sounds: Normal breath sounds.  Abdominal:     General: Bowel sounds are normal.     Palpations: Abdomen is soft.  Musculoskeletal:     Cervical back: Normal range of motion.  Lymphadenopathy:     Cervical: No cervical adenopathy.  Skin:    General: Skin is warm and dry.     Capillary Refill: Capillary refill takes less than 2 seconds.  Neurological:     Mental Status: She is alert and oriented to person, place, and time.  Psychiatric:        Behavior: Behavior normal.      Musculoskeletal Exam: Patient had very limited range of motion of the cervical spine on lateral rotation and forward flexion and extension.  Thoracic kyphosis without tenderness was noted.  She had no tenderness over lumbar spine.  Right shoulder joint was in good range of motion which she had recent surgery.  Left shoulder joint abduction was limited to about 40 degrees.  Elbow joints and wrist joints with good range of motion.  She had bilateral CMC thickening and subluxation.  She had thickening of MCP PIP and DIP joints with synovitis over some of her PIP joints as described below.   Hip joints could not be assessed in the seated position.  Right knee joint was replaced without any warmth swelling or effusion.  Left knee joint was in good range of motion.  She has some swelling over her right ankle joint.  There was no swelling over the left ankle joint.  CDAI Exam: CDAI Score: 22  Patient Global: 40 / 100; Provider Global: 60 / 100 Swollen: 7 ; Tender: 7  Joint Exam 11/18/2023      Right  Left  PIP 2 (finger)  Swollen Tender  Swollen Tender  PIP 3 (finger)  Swollen Tender  Swollen Tender  PIP 4 (finger)  Swollen Tender  Swollen Tender  Ankle  Swollen Tender        Investigation: No additional findings.  Imaging: No results found.   Recent Labs: Lab Results  Component Value Date   WBC 6.9 10/10/2023   HGB 8.3 (L) 10/10/2023   PLT 188 10/10/2023   NA 138 10/10/2023   K 3.7 10/10/2023   CL 106 10/10/2023   CO2 24 10/10/2023   GLUCOSE 134 (H) 10/10/2023   BUN 23 10/10/2023   CREATININE 0.86 10/10/2023   BILITOT 0.7 09/17/2023   ALKPHOS 62 10/23/2021   AST 23 09/17/2023   ALT 19 09/17/2023   PROT 6.0 (L) 09/17/2023   ALBUMIN  4.6 10/23/2021   CALCIUM  8.7 (L) 10/10/2023   GFRAA 98 04/03/2020   QFTBGOLDPLUS NEGATIVE 04/10/2023    Speciality Comments: PLQ Eye Exam: 11/25/2022 WNL @ [facility]. Follow up in 1 year  Osteoporosis managed by PCP-ACY 10/12/2018 Rinvoq -diarrhea, Xeljanz -12/19/20 Reclast  06/24, 06/25  Procedures:  No procedures performed Allergies: Shellfish allergy, Codeine, and Demerol  [meperidine ]   Assessment / Plan:     Visit Diagnoses: 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  - severe rheumatoid arthritis and osteoarthritis overlap: Patient appears to be having a flare of rheumatoid arthritis.  She was off Olumiant  for 2 weeks for right shoulder total replacement.  She resumed both medications and is gradually getting better.  She has synovitis over her bilateral  hands PIP joints and also over her right ankle joint.  Patient is not in much discomfort.  Will continue current treatment.  High risk medication use - Olumiant  2 mg by mouth daily, plaquenil  200 mg 1 tablet by mouth daily. PLQ Eye Exam: 11/25/2022.  Her eye examination is coming up.  CBC and CMP were stable in July 2025.  Hemoglobin continues to be low and creatinine was mildly elevated which normalized in August 2025.  Her hemoglobin was low after the surgery on August 9.  I advised her to get repeat labs in October.  Information reimmunization was placed in the AVS.  She was advised to hold Olumiant  if she develops an infection and restart after the infection resolves.  FDA blackbox warning associated with Jak and imagers was revised and the information was placed in the AVS.  She was advised to have annual skin examination.  He is of sunscreen and sun protection was discussed due to increased risk of skin cancer on check in the betters.  She  Status post reverse total replacement of right shoulder - By Dr. Mariea on 10/09/23.  Good range of motion with minimal discomfort.  H/O total shoulder replacement, left - Revision x2 performed by Dr. Kay.  Primary osteoarthritis of both hands-she has severe rheumatoid arthritis and osteoarthritis overlap with some inflammation over the PIP joints as described above.  Status post right knee replacement-she had good range of motion without discomfort.  Primary osteoarthritis of left knee-she could range of motion without warmth swelling or effusion.  Primary osteoarthritis of both feet-she had tenderness and swelling over the right ankle joint.  Closed fracture of cervical vertebra, unspecified cervical vertebral level, sequela - Fall in January 2020-she fractured C1 and C2 with displacement requiring fusion on 12/06/2019.  She had limited range of motion.  Osteopenia of multiple sites - 07/10/2021 DEXA BMD 0.728, T score -  2.2,RFN.Previously on  fosamax -vertebral fracture.Completed 2 years of Forteo .first Reclast  08/15/22.  She supposed to have repeat DEXA scan this year.  She will schedule it through Dr. Cleotilde.  She had Reclast  infusion in June 2024 in June 2025.  She will most likely go on a drug holiday for next 2 years.  She had Forteo  for 2 years prior to starting Reclast .  Postural kyphosis of thoracic region-without tenderness.  Vitamin D  deficiency-vitamin D  40 on August 10, 2023.  Other medical problems are listed as follows:  History of GI bleed  History of diverticulosis  History of peripheral neuropathy  History of cataract  History of multiple sclerosis (HCC)  Dyslipidemia -we will check lipid panel with her next labs in October.  Plan: Lipid panel  Orders: Orders Placed This Encounter  Procedures   Lipid panel   No orders of the defined types were placed in this encounter.    Follow-Up Instructions: Return in about 5 months (around 04/19/2024) for Rheumatoid arthritis, Osteoarthritis.   Maya Nash, MD  Note - This record has been created using Animal nutritionist.  Chart creation errors have been sought, but may not always  have been located. Such creation errors do not reflect on  the standard of medical care.

## 2023-11-10 ENCOUNTER — Other Ambulatory Visit: Payer: Self-pay | Admitting: Pharmacy Technician

## 2023-11-10 ENCOUNTER — Other Ambulatory Visit: Payer: Self-pay

## 2023-11-10 NOTE — Progress Notes (Signed)
 Specialty Pharmacy Refill Coordination Note  Michele Bryan is a 83 y.o. female contacted today regarding refills of specialty medication(s) Baricitinib  (OLUMIANT )   Patient requested Delivery   Delivery date: 11/19/23   Verified address: [address] [place]   Medication will be filled on 11/18/23.

## 2023-11-11 DIAGNOSIS — M419 Scoliosis, unspecified: Secondary | ICD-10-CM | POA: Diagnosis not present

## 2023-11-12 ENCOUNTER — Other Ambulatory Visit: Payer: Self-pay

## 2023-11-18 ENCOUNTER — Other Ambulatory Visit: Payer: Self-pay

## 2023-11-18 ENCOUNTER — Encounter: Payer: Self-pay | Admitting: Rheumatology

## 2023-11-18 ENCOUNTER — Ambulatory Visit: Attending: Rheumatology | Admitting: Rheumatology

## 2023-11-18 VITALS — BP 126/70 | HR 67 | Temp 98.1°F | Resp 13 | Ht 63.0 in | Wt 121.2 lb

## 2023-11-18 DIAGNOSIS — Z96611 Presence of right artificial shoulder joint: Secondary | ICD-10-CM | POA: Diagnosis not present

## 2023-11-18 DIAGNOSIS — Z8719 Personal history of other diseases of the digestive system: Secondary | ICD-10-CM

## 2023-11-18 DIAGNOSIS — Z79899 Other long term (current) drug therapy: Secondary | ICD-10-CM

## 2023-11-18 DIAGNOSIS — E559 Vitamin D deficiency, unspecified: Secondary | ICD-10-CM

## 2023-11-18 DIAGNOSIS — E785 Hyperlipidemia, unspecified: Secondary | ICD-10-CM

## 2023-11-18 DIAGNOSIS — M19041 Primary osteoarthritis, right hand: Secondary | ICD-10-CM | POA: Diagnosis not present

## 2023-11-18 DIAGNOSIS — M19072 Primary osteoarthritis, left ankle and foot: Secondary | ICD-10-CM

## 2023-11-18 DIAGNOSIS — M19071 Primary osteoarthritis, right ankle and foot: Secondary | ICD-10-CM

## 2023-11-18 DIAGNOSIS — G35 Multiple sclerosis: Secondary | ICD-10-CM

## 2023-11-18 DIAGNOSIS — M8589 Other specified disorders of bone density and structure, multiple sites: Secondary | ICD-10-CM

## 2023-11-18 DIAGNOSIS — Z96612 Presence of left artificial shoulder joint: Secondary | ICD-10-CM

## 2023-11-18 DIAGNOSIS — S129XXS Fracture of neck, unspecified, sequela: Secondary | ICD-10-CM

## 2023-11-18 DIAGNOSIS — Z8669 Personal history of other diseases of the nervous system and sense organs: Secondary | ICD-10-CM

## 2023-11-18 DIAGNOSIS — M19042 Primary osteoarthritis, left hand: Secondary | ICD-10-CM

## 2023-11-18 DIAGNOSIS — G8929 Other chronic pain: Secondary | ICD-10-CM

## 2023-11-18 DIAGNOSIS — Z96651 Presence of right artificial knee joint: Secondary | ICD-10-CM | POA: Diagnosis not present

## 2023-11-18 DIAGNOSIS — M1712 Unilateral primary osteoarthritis, left knee: Secondary | ICD-10-CM

## 2023-11-18 DIAGNOSIS — M0579 Rheumatoid arthritis with rheumatoid factor of multiple sites without organ or systems involvement: Secondary | ICD-10-CM

## 2023-11-18 DIAGNOSIS — M4004 Postural kyphosis, thoracic region: Secondary | ICD-10-CM

## 2023-11-18 DIAGNOSIS — Z5181 Encounter for therapeutic drug level monitoring: Secondary | ICD-10-CM

## 2023-11-18 NOTE — Patient Instructions (Addendum)
 Standing Labs We placed an order today for your standing lab work.   Please have your standing labs drawn in October for CBC with differential, CMP with GFR and lipid panel  Please have your labs drawn 2 weeks prior to your appointment so that the provider can discuss your lab results at your appointment, if possible.  Please note that you may see your imaging and lab results in MyChart before we have reviewed them. We will contact you once all results are reviewed. Please allow our office up to 72 hours to thoroughly review all of the results before contacting the office for clarification of your results.  WALK-IN LAB HOURS  Monday through Thursday from 8:00 am -12:30 pm and 1:00 pm-4:30 pm and Friday from 8:00 am-12:00 pm.  Patients with office visits requiring labs will be seen before walk-in labs.  You may encounter longer than normal wait times. Please allow additional time. Wait times may be shorter on  Monday and Thursday afternoons.  We do not book appointments for walk-in labs. We appreciate your patience and understanding with our staff.   Labs are drawn by [facility]. Please bring your co-pay at the time of your lab draw.  You may receive a bill from [facility] for your lab work.  Please note if you are on Hydroxychloroquine  and and an order has been placed for a Hydroxychloroquine  level,  you will need to have it drawn 4 hours or more after your last dose.  If you wish to have your labs drawn at another location, please call the office 24 hours in advance so we can fax the orders.  The office is located at [address], [address], [place], [place] [address]   If you have any questions regarding directions or hours of operation,  please call [phone].   As a reminder, please drink plenty of water  prior to coming for your lab work. Thanks!   Vaccines You are taking a medication(s) that can suppress your immune system.  The following immunizations are recommended: Flu  annually Covid-19  Td/Tdap (tetanus, diphtheria, pertussis) every 10 years Pneumonia (Prevnar 15 then Pneumovax 23 at least 1 year apart.  Alternatively, can take Prevnar 20 without needing additional dose) Shingrix: 2 doses from 4 weeks to 6 months apart  Please check with your PCP to make sure you are up to date.   If you have signs or symptoms of an infection or start antibiotics: First, call your PCP for workup of your infection. Hold your medication through the infection, until you complete your antibiotics, and until symptoms resolve if you take the following: Injectable medication (Actemra, Benlysta, Cimzia, Cosentyx, Enbrel, Humira, Kevzara, Orencia , Remicade, Simponi, Stelara, Taltz, Tremfya) Methotrexate  Leflunomide (Arava) Mycophenolate (Cellcept) Xeljanz , Olumiant , or Rinvoq    Because you are taking Xeljanz , Rinvoq , or Olumiant , it is very important to know that this class of medications has a FDA BOXED WARNING for major adverse cardiovascular events (MACE), thrombosis, mortality (including sudden cardiovascular death), serious infections, and lymphomas. MACE is defined as cardiovascular death, myocardial infarction, and stroke. Thrombosis includes deep venous thrombosis (DVT), pulmonary embolism (PE), and arterial thrombosis. If you are a current or former smoker, you are at higher risk for MACE.   Please get an annual skin examination by the dermatologist.  Please use sunscreen and sun protection.

## 2023-11-19 DIAGNOSIS — Z4889 Encounter for other specified surgical aftercare: Secondary | ICD-10-CM | POA: Diagnosis not present

## 2023-12-02 ENCOUNTER — Other Ambulatory Visit (HOSPITAL_BASED_OUTPATIENT_CLINIC_OR_DEPARTMENT_OTHER): Payer: Self-pay | Admitting: Family Medicine

## 2023-12-02 DIAGNOSIS — M810 Age-related osteoporosis without current pathological fracture: Secondary | ICD-10-CM

## 2023-12-10 ENCOUNTER — Other Ambulatory Visit: Payer: Self-pay | Admitting: Physician Assistant

## 2023-12-10 ENCOUNTER — Other Ambulatory Visit: Payer: Self-pay

## 2023-12-10 DIAGNOSIS — M0579 Rheumatoid arthritis with rheumatoid factor of multiple sites without organ or systems involvement: Secondary | ICD-10-CM

## 2023-12-10 DIAGNOSIS — Z79899 Other long term (current) drug therapy: Secondary | ICD-10-CM

## 2023-12-10 MED ORDER — BARICITINIB 2 MG PO TABS
2.0000 mg | ORAL_TABLET | Freq: Every day | ORAL | 0 refills | Status: DC
  Filled 2023-12-10 – 2023-12-13 (×2): qty 90, 90d supply, fill #0
  Filled 2023-12-14 (×2): qty 30, 30d supply, fill #0
  Filled 2024-01-15 – 2024-01-18 (×2): qty 30, 30d supply, fill #1
  Filled 2024-02-10: qty 30, 30d supply, fill #2

## 2023-12-10 NOTE — Telephone Encounter (Signed)
 Last Fill: 09/14/2023  Labs: 10/10/2023 RBC 2.72, Hgb 8.3, Hct 27.0, Glucose 134, Calcium  8.7  TB Gold: 04/10/2023 Neg    Next Visit: 04/21/2024  Last Visit: 11/18/2023  DX: Rheumatoid arthritis involving multiple sites with positive rheumatoid factor   Current Dose per office note 11/18/2023: Olumiant  2 mg by mouth daily,   Okay to refill Olumiant ?

## 2023-12-11 ENCOUNTER — Encounter (INDEPENDENT_AMBULATORY_CARE_PROVIDER_SITE_OTHER): Payer: Self-pay

## 2023-12-14 ENCOUNTER — Other Ambulatory Visit: Payer: Self-pay

## 2023-12-14 NOTE — Progress Notes (Signed)
 Clinical Intervention Note  Clinical Intervention Notes: Patient reported that she was instructed to hold medication 1 week prior and 1 week after shoulder replacement surgery on 10/09/23. Patient has resumed medication without any issues.   Clinical Intervention Outcomes: Improved therapy adherence   [facility] Specialty Pharmacist

## 2023-12-14 NOTE — Progress Notes (Signed)
 Specialty Pharmacy Refill Coordination Note  Michele Bryan is a 83 y.o. female contacted today regarding refills of specialty medication(s) Baricitinib  (OLUMIANT )   Patient requested Delivery   Delivery date: 12/28/23   Verified address: [address] [place], [place], [place]. [address]   Medication will be filled on 12/25/23.

## 2023-12-21 ENCOUNTER — Other Ambulatory Visit: Payer: Self-pay | Admitting: *Deleted

## 2023-12-21 DIAGNOSIS — E785 Hyperlipidemia, unspecified: Secondary | ICD-10-CM | POA: Diagnosis not present

## 2023-12-21 DIAGNOSIS — Z79899 Other long term (current) drug therapy: Secondary | ICD-10-CM | POA: Diagnosis not present

## 2023-12-21 LAB — CBC WITH DIFFERENTIAL/PLATELET
Absolute Lymphocytes: 1375 {cells}/uL (ref 850–3900)
Absolute Monocytes: 451 {cells}/uL (ref 200–950)
Basophils Absolute: 18 {cells}/uL (ref 0–200)
Basophils Relative: 0.4 %
Eosinophils Absolute: 0 {cells}/uL — ABNORMAL LOW (ref 15–500)
Eosinophils Relative: 0 %
HCT: 31.6 % — ABNORMAL LOW (ref 35.0–45.0)
Hemoglobin: 10 g/dL — ABNORMAL LOW (ref 11.7–15.5)
MCH: 30 pg (ref 27.0–33.0)
MCHC: 31.6 g/dL — ABNORMAL LOW (ref 32.0–36.0)
MCV: 94.9 fL (ref 80.0–100.0)
MPV: 9.8 fL (ref 7.5–12.5)
Monocytes Relative: 9.8 %
Neutro Abs: 2755 {cells}/uL (ref 1500–7800)
Neutrophils Relative %: 59.9 %
Platelets: 261 Thousand/uL (ref 140–400)
RBC: 3.33 Million/uL — ABNORMAL LOW (ref 3.80–5.10)
RDW: 13 % (ref 11.0–15.0)
Total Lymphocyte: 29.9 %
WBC: 4.6 Thousand/uL (ref 3.8–10.8)

## 2023-12-21 LAB — LIPID PANEL
Cholesterol: 164 mg/dL (ref <200)
HDL: 92 mg/dL (ref >=50)
LDL Cholesterol (Calc): 60 mg/dL
Non-HDL Cholesterol (Calc): 72 mg/dL (ref <130)
Total CHOL/HDL Ratio: 1.8 (calc) (ref <5.0)
Triglycerides: 43 mg/dL (ref <150)

## 2023-12-21 LAB — COMPREHENSIVE METABOLIC PANEL WITH GFR
AG Ratio: 2.3 (calc) (ref 1.0–2.5)
ALT: 17 U/L (ref 6–29)
AST: 23 U/L (ref 10–35)
Albumin: 4.5 g/dL (ref 3.6–5.1)
Alkaline phosphatase (APISO): 39 U/L (ref 37–153)
BUN/Creatinine Ratio: 32 (calc) — ABNORMAL HIGH (ref 6–22)
BUN: 27 mg/dL — ABNORMAL HIGH (ref 7–25)
CO2: 27 mmol/L (ref 20–32)
Calcium: 9.2 mg/dL (ref 8.6–10.4)
Chloride: 108 mmol/L (ref 98–110)
Creat: 0.85 mg/dL (ref 0.60–0.95)
Globulin: 2 g/dL (ref 1.9–3.7)
Glucose, Bld: 96 mg/dL (ref 65–99)
Potassium: 4.1 mmol/L (ref 3.5–5.3)
Sodium: 142 mmol/L (ref 135–146)
Total Bilirubin: 0.6 mg/dL (ref 0.2–1.2)
Total Protein: 6.5 g/dL (ref 6.1–8.1)
eGFR: 69 mL/min/1.73m2 (ref >=60)

## 2023-12-22 ENCOUNTER — Ambulatory Visit: Payer: Self-pay | Admitting: Physician Assistant

## 2023-12-22 ENCOUNTER — Ambulatory Visit: Payer: Self-pay | Admitting: Rheumatology

## 2023-12-22 NOTE — Progress Notes (Signed)
 Lipid panel normal, hemoglobin is low and stable.  CMP is normal.

## 2023-12-22 NOTE — Progress Notes (Signed)
 Patient remains anemic--improving.   CMP stable.

## 2023-12-25 ENCOUNTER — Other Ambulatory Visit: Payer: Self-pay

## 2024-01-12 DIAGNOSIS — F03A0 Unspecified dementia, mild, without behavioral disturbance, psychotic disturbance, mood disturbance, and anxiety: Secondary | ICD-10-CM | POA: Diagnosis not present

## 2024-01-12 DIAGNOSIS — Z6823 Body mass index (BMI) 23.0-23.9, adult: Secondary | ICD-10-CM | POA: Diagnosis not present

## 2024-01-12 DIAGNOSIS — Z1231 Encounter for screening mammogram for malignant neoplasm of breast: Secondary | ICD-10-CM | POA: Diagnosis not present

## 2024-01-12 DIAGNOSIS — E7800 Pure hypercholesterolemia, unspecified: Secondary | ICD-10-CM | POA: Diagnosis not present

## 2024-01-12 DIAGNOSIS — K5901 Slow transit constipation: Secondary | ICD-10-CM | POA: Diagnosis not present

## 2024-01-12 DIAGNOSIS — R1032 Left lower quadrant pain: Secondary | ICD-10-CM | POA: Diagnosis not present

## 2024-01-12 DIAGNOSIS — G4700 Insomnia, unspecified: Secondary | ICD-10-CM | POA: Diagnosis not present

## 2024-01-12 DIAGNOSIS — I10 Essential (primary) hypertension: Secondary | ICD-10-CM | POA: Diagnosis not present

## 2024-01-14 ENCOUNTER — Other Ambulatory Visit: Payer: Self-pay

## 2024-01-15 ENCOUNTER — Other Ambulatory Visit: Payer: Self-pay

## 2024-01-18 ENCOUNTER — Other Ambulatory Visit: Payer: Self-pay

## 2024-01-18 ENCOUNTER — Encounter (INDEPENDENT_AMBULATORY_CARE_PROVIDER_SITE_OTHER): Payer: Self-pay

## 2024-01-18 DIAGNOSIS — M069 Rheumatoid arthritis, unspecified: Secondary | ICD-10-CM | POA: Diagnosis not present

## 2024-01-18 DIAGNOSIS — Z79899 Other long term (current) drug therapy: Secondary | ICD-10-CM | POA: Diagnosis not present

## 2024-01-18 DIAGNOSIS — H468 Other optic neuritis: Secondary | ICD-10-CM | POA: Diagnosis not present

## 2024-01-18 DIAGNOSIS — Z961 Presence of intraocular lens: Secondary | ICD-10-CM | POA: Diagnosis not present

## 2024-01-18 NOTE — Progress Notes (Signed)
 Specialty Pharmacy Refill Coordination Note  Michele Bryan is a 83 y.o. female contacted today regarding refills of specialty medication(s) Baricitinib  (OLUMIANT )   Patient requested (Patient-Rptd) Delivery   Delivery date: 01/20/24   Verified address: (Patient-Rptd) [address] [name] [address]   Medication will be filled on: 01/19/24

## 2024-01-19 ENCOUNTER — Other Ambulatory Visit: Payer: Self-pay

## 2024-01-25 ENCOUNTER — Ambulatory Visit (HOSPITAL_BASED_OUTPATIENT_CLINIC_OR_DEPARTMENT_OTHER)
Admission: RE | Admit: 2024-01-25 | Discharge: 2024-01-25 | Disposition: A | Discharge status: Home / Self Care | Source: Ambulatory Visit | Attending: Family Medicine | Admitting: Family Medicine

## 2024-01-25 DIAGNOSIS — Z780 Asymptomatic menopausal state: Secondary | ICD-10-CM | POA: Diagnosis not present

## 2024-01-25 DIAGNOSIS — M810 Age-related osteoporosis without current pathological fracture: Secondary | ICD-10-CM | POA: Diagnosis not present

## 2024-01-27 ENCOUNTER — Other Ambulatory Visit: Payer: Self-pay | Admitting: Physician Assistant

## 2024-01-27 DIAGNOSIS — M0579 Rheumatoid arthritis with rheumatoid factor of multiple sites without organ or systems involvement: Secondary | ICD-10-CM

## 2024-01-27 NOTE — Telephone Encounter (Signed)
 Last Fill: 11/03/2023  Eye exam: 11/25/2022 WNL    Labs: 12/21/2023  anemic--improving.   CMP stable.   Next Visit: 04/21/2024  Last Visit: 11/18/2023  IK:Myzlfjunpi arthritis involving multiple sites with positive rheumatoid factor   Current Dose per office note 11/18/2023: plaquenil  200 mg 1 tablet by mouth daily   Patient advised she is due to update her PLQ Eye exam. Patient states she updated last week and will call the eye doctor to have them send results.   Okay to refill Plaquenil ?

## 2024-02-08 ENCOUNTER — Telehealth: Payer: Self-pay

## 2024-02-08 NOTE — Telephone Encounter (Signed)
 Received DEXA results from: [facility] (report/results in imaging tab)   Date of Scan: 01/25/2024  Current Regimen: Reclast , last infusion 08/2023  Recommendation: discuss at follow up, plan reclast  #3  Reviewed by: Dr. Dolphus   Next Appointment:  04/21/2024

## 2024-02-10 ENCOUNTER — Other Ambulatory Visit: Payer: Self-pay

## 2024-02-11 ENCOUNTER — Telehealth: Payer: Self-pay | Admitting: Pharmacist

## 2024-02-11 NOTE — Telephone Encounter (Signed)
 Received notification from [employer] regarding a prior authorization for OLUMIANT . Authorization has been APPROVED from 02/11/2024 to 03/02/2025. Approval letter sent to scan center.  Per test claim, copay for 30 days supply is $100  Patient can continue to fill through [facility]: [phone]   Sherry Pennant, PharmD, MPH, BCPS, CPP Clinical Pharmacist

## 2024-02-11 NOTE — Telephone Encounter (Signed)
 Submitted a Prior Authorization RENEWAL request to [employer] for OLUMIANT  via CoverMyMeds. Will update once we receive a response.  Key: [id number]

## 2024-02-16 ENCOUNTER — Other Ambulatory Visit: Payer: Self-pay

## 2024-02-16 NOTE — Progress Notes (Signed)
 Specialty Pharmacy Refill Coordination Note  Michele Bryan is a 83 y.o. female contacted today regarding refills of specialty medication(s) Baricitinib  (OLUMIANT )   Patient requested Delivery   Delivery date: 02/26/24   Verified address: Patient address [address]  [place] [address]   Medication will be filled on: 02/24/24

## 2024-02-18 ENCOUNTER — Other Ambulatory Visit: Payer: Self-pay

## 2024-02-18 NOTE — Progress Notes (Signed)
 Specialty Pharmacy Ongoing Clinical Assessment Note  Michele Bryan is a 83 y.o. female who is being followed by the specialty pharmacy service for RxSp Rheumatoid Arthritis   Patient's specialty medication(s) reviewed today: Baricitinib  (OLUMIANT )   Missed doses in the last 4 weeks: 0   Patient/Caregiver did not have any additional questions or concerns.   Therapeutic benefit summary: Patient is achieving benefit   Adverse events/side effects summary: No adverse events/side effects   Patient's therapy is appropriate to: Continue    Goals Addressed             This Visit's Progress    Maintain optimal adherence to therapy   On track    Patient is on track. Patient will work on increased adherence and adhere to provider and/or lab appointments         Follow up: 12 months  Silvano [name]

## 2024-02-24 ENCOUNTER — Other Ambulatory Visit: Payer: Self-pay

## 2024-03-18 ENCOUNTER — Other Ambulatory Visit: Payer: Self-pay

## 2024-03-18 ENCOUNTER — Other Ambulatory Visit: Payer: Self-pay | Admitting: Rheumatology

## 2024-03-18 DIAGNOSIS — M0579 Rheumatoid arthritis with rheumatoid factor of multiple sites without organ or systems involvement: Secondary | ICD-10-CM

## 2024-03-18 DIAGNOSIS — Z79899 Other long term (current) drug therapy: Secondary | ICD-10-CM

## 2024-03-18 MED ORDER — BARICITINIB 2 MG PO TABS
2.0000 mg | ORAL_TABLET | Freq: Every day | ORAL | 0 refills | Status: AC
  Filled 2024-03-18 – 2024-03-21 (×2): qty 30, 30d supply, fill #0

## 2024-03-18 NOTE — Telephone Encounter (Signed)
 Last Fill: 12/10/2023  Labs: 12/21/2023 remains anemic--improving.   CMP stable.  TB Gold: 04/10/2023 Neg    Next Visit: 04/21/2024  Last Visit: 11/18/2023  DX: Rheumatoid arthritis involving multiple sites with positive rheumatoid factor   Current Dose per office note 11/18/2023:  Olumiant  2 mg by mouth daily  Patient advised she is due to updated her labs.   Okay to refill Olumiant ?

## 2024-03-21 ENCOUNTER — Encounter: Payer: Self-pay | Admitting: Hematology

## 2024-03-21 ENCOUNTER — Other Ambulatory Visit: Payer: Self-pay

## 2024-03-21 ENCOUNTER — Other Ambulatory Visit (HOSPITAL_COMMUNITY): Payer: Self-pay

## 2024-03-21 NOTE — Progress Notes (Signed)
 Specialty Pharmacy Refill Coordination Note  Michele Bryan is a 84 y.o. female contacted today regarding refills of specialty medication(s) Baricitinib  (OLUMIANT )   Patient requested Delivery   Delivery date: 03/22/24   Verified address: [address]  [place] [address]   Medication will be filled on: 03/21/24

## 2024-04-07 NOTE — Progress Notes (Unsigned)
 "  Office Visit Note  Patient: Michele Bryan             Date of Birth: [birth year]           MRN: [id number]             PCP: Cleotilde Planas, MD Referring: Cleotilde Planas, MD Visit Date: 04/21/2024 Occupation: Data Unavailable  Subjective:  No chief complaint on file.   History of Present Illness: Michele Bryan is a 84 y.o. female ***     Activities of Daily Living:  Patient reports morning stiffness for *** {minute/hour:19697}.   Patient {ACTIONS;DENIES/REPORTS:21021675::Denies} nocturnal pain.  Difficulty dressing/grooming: {ACTIONS;DENIES/REPORTS:21021675::Denies} Difficulty climbing stairs: {ACTIONS;DENIES/REPORTS:21021675::Denies} Difficulty getting out of chair: {ACTIONS;DENIES/REPORTS:21021675::Denies} Difficulty using hands for taps, buttons, cutlery, and/or writing: {ACTIONS;DENIES/REPORTS:21021675::Denies}  No Rheumatology ROS completed.   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   Gait abnormality 03/08/2020   Postoperative pain    Benign essential HTN    Constipation    Anemia of chronic disease    Essential hypertension    Orthostatic hypotension    Cervical myelopathy (HCC) 02/21/2020   Excoriation of ear canal, left, initial encounter 01/17/2020   Impacted cerumen of left ear 01/17/2020   Closed cervical spine fracture (HCC) 12/06/2019   Scapula fracture 03/05/2018   Fracture of acromial process of scapula, closed 02/15/2018   Chronic left shoulder pain 11/25/2017   Left cervical radiculopathy 09/29/2017   S/P shoulder replacement, left 05/15/2017   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  05/06/2016   High risk medication use 05/06/2016   Primary osteoarthritis of both hands 05/06/2016   Primary osteoarthritis of left knee 05/06/2016   Primary  osteoarthritis of both feet 05/06/2016   Dyslipidemia 05/06/2016   Peripheral neuropathy 05/06/2016   Diverticulosis of intestine without bleeding 05/06/2016   Osteopenia  05/06/2016   Vitamin D  deficiency 05/06/2016   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   History of GI bleed 05/06/2016   Cataract of both eyes 05/06/2016   Degenerative joint disease of hand 05/06/2016   Osteoarthritis of foot joint 05/06/2016   Urinary urgency 04/30/2015   Chronic constipation 04/30/2015   MS (multiple sclerosis) (HCC) 03/23/2013   Expected blood loss anemia 06/16/2012   S/P right TKA 06/15/2012    Past Medical History:  Diagnosis Date   Anemia    Arthritis    Fibromyalgia    GERD (gastroesophageal reflux disease)    History of blood transfusion    Hyperlipidemia    Hypertension    Lower extremity edema    MS (multiple sclerosis)    Neuromuscular disorder (HCC)    multiple scleroosis/peripheral neuropathy   PONV (postoperative nausea and vomiting)    Rheumatoid arthritis (HCC)    Ulcer    Vitamin D  deficiency     Family History  Problem Relation Age of Onset   Heart attack Father    Breast cancer Neg Hx    Past Surgical History:  Procedure Laterality Date   APPLICATION OF INTRAOPERATIVE CT SCAN N/A 02/16/2020   Procedure: APPLICATION OF INTRAOPERATIVE CT SCAN;  Surgeon: Cheryle Debby LABOR, MD;  Location: [facility] OR;  Service: Neurosurgery;  Laterality: N/A;   AUGMENTATION MAMMAPLASTY     BACK SURGERY     breast augumentation     BREAST SURGERY  biopsy   COLONOSCOPY     DILATION AND CURETTAGE OF UTERUS     EYE SURGERY     both eys,cataracts   goiter     HALO APPLICATION N/A 02/16/2020   Procedure: HALO TRACTION APPLICATION;  Surgeon: Cheryle Debby LABOR, MD;  Location: [facility] OR;  Service: Neurosurgery;  Laterality: N/A;   JOINT REPLACEMENT     KNEE ARTHROSCOPY Right    ORIF SHOULDER FRACTURE Left 03/05/2018   Procedure: OPEN REDUCTION INTERNAL FIXATION (ORIF) LEFT  SCAPULA;  Surgeon: Kay Kemps, MD;  Location: [facility] OR;  Service: Orthopedics;  Laterality: Left;   partial toe amputation Right 05/16/2022   2nd toe   POSTERIOR CERVICAL FUSION/FORAMINOTOMY N/A 12/06/2019   Procedure: Cervical one-two Posterior instrumented fusion;  Surgeon: Cheryle Debby LABOR, MD;  Location: [facility] OR;  Service: Neurosurgery;  Laterality: N/A;   POSTERIOR CERVICAL FUSION/FORAMINOTOMY N/A 02/13/2020   Procedure: Revision of cervical instrumented fusion with Occiput to Cervical Four posterior instrumented fusion;  Surgeon: Cheryle Debby LABOR, MD;  Location: [facility] OR;  Service: Neurosurgery;  Laterality: N/A;  posterior   POSTERIOR CERVICAL FUSION/FORAMINOTOMY N/A 02/16/2020   Procedure: Occiput to Thoracic Two Posterior Cervical Fusion with AIRO and Application of Halo;  Surgeon: Cheryle Debby LABOR, MD;  Location: [facility] OR;  Service: Neurosurgery;  Laterality: N/A;   REVERSE SHOULDER ARTHROPLASTY Left 05/15/2017   Procedure: LEFT REVERSE SHOULDER ARTHROPLASTY;  Surgeon: Kay Kemps, MD;  Location: [facility] OR;  Service: Orthopedics;  Laterality: Left;   REVERSE SHOULDER ARTHROPLASTY Left 10/11/2018   Procedure: left shoulder irrigation and debridement, open poly exchange and removal of painful hardware;  Surgeon: Kay Kemps, MD;  Location: [facility] OR[facility];  Service: Orthopedics;  Laterality: Left;   REVERSE SHOULDER ARTHROPLASTY Right 10/09/2023   Procedure: ARTHROPLASTY, SHOULDER, TOTAL, REVERSE;  Surgeon: Kay Kemps, MD;  Location: [facility];  Service: Orthopedics;  Laterality: Right;   SHOULDER HEMI-ARTHROPLASTY Left 12/11/2020   Procedure: Left reverse shoulder conversion to SHOULDER HEMI-ARTHROPLASTY;  Surgeon: Kay Kemps, MD;  Location: [facility];  Service: Orthopedics;  Laterality: Left;  with ISB   THYROID  LOBECTOMY     TOTAL KNEE ARTHROPLASTY Right 06/15/2012   Procedure: RIGHT TOTAL KNEE ARTHROPLASTY;  Surgeon: Donnice JONETTA Car, MD;  Location: [facility];  Service: Orthopedics;  Laterality:  Right;   WRIST SURGERY Left    Social History[1] Social History   Social History Narrative   Patient lives at home with her husband Michele Bryan).   Retired.   Education- college.   Caffeine- Two cups of decaf tea daily.   Right handed.           Immunization History  Administered Date(s) Administered   INFLUENZA, HIGH DOSE SEASONAL PF 12/03/2013   Influenza,inj,quad, With Preservative 11/23/2014   Influenza-Unspecified 12/19/2017   PFIZER(Purple Top)SARS-COV-2 Vaccination 04/16/2019, 05/29/2019, 01/09/2020   Td 12/15/2017   Td (Adult),5 Lf Tetanus Toxid, Preservative Free 12/15/2017   Zoster Recombinant(Shingrix) 08/10/2017, 01/05/2018   Zoster, Unspecified 08/10/2017, 01/05/2018     Objective: Vital Signs: There were no vitals taken for this visit.   Physical Exam   Musculoskeletal Exam: ***  CDAI Exam: CDAI Score: -- Patient Global: --; Provider Global: -- Swollen: --; Tender: -- Joint Exam 04/21/2024   No joint exam has been documented for this visit   There is currently no information documented on the homunculus. Go to the Rheumatology activity and complete the homunculus joint exam.  Investigation: No additional findings.  Imaging: No results found.  Recent Labs: Lab Results  Component  Value Date   WBC 4.6 12/21/2023   HGB 10.0 (L) 12/21/2023   PLT 261 12/21/2023   NA 142 12/21/2023   K 4.1 12/21/2023   CL 108 12/21/2023   CO2 27 12/21/2023   GLUCOSE 96 12/21/2023   BUN 27 (H) 12/21/2023   CREATININE 0.85 12/21/2023   BILITOT 0.6 12/21/2023   ALKPHOS 62 10/23/2021   AST 23 12/21/2023   ALT 17 12/21/2023   PROT 6.5 12/21/2023   ALBUMIN  4.6 10/23/2021   CALCIUM  9.2 12/21/2023   GFRAA 98 04/03/2020   QFTBGOLDPLUS NEGATIVE 04/10/2023    Speciality Comments: PLQ Eye Exam: 11/25/2022 WNL @ [facility], [place]. Follow up in 1 year  Osteoporosis managed by PCP-[facility] 10/12/2018 Rinvoq -diarrhea, Xeljanz -12/19/20 Reclast  06/24,  06/25  Procedures:  No procedures performed Allergies: Shellfish allergy, Codeine, and Demerol  [meperidine ]   Assessment / Plan:     Visit Diagnoses: Rheumatoid arthritis involving multiple sites with positive rheumatoid factor (HCC)  High risk medication use  Status post reverse total replacement of right shoulder  H/O total shoulder replacement, left  Primary osteoarthritis of both hands  Status post right knee replacement  Primary osteoarthritis of left knee  Primary osteoarthritis of both feet  Postural kyphosis of thoracic region  Closed fracture of cervical vertebra, unspecified cervical vertebral level, sequela  Osteopenia of multiple sites  Vitamin D  deficiency  History of diverticulosis  History of GI bleed  History of peripheral neuropathy  History of multiple sclerosis  History of cataract  Orders: No orders of the defined types were placed in this encounter.  No orders of the defined types were placed in this encounter.   Face-to-face time spent with patient was *** minutes. Greater than 50% of time was spent in counseling and coordination of care.  Follow-Up Instructions: No follow-ups on file.   Daved JAYSON Gavel, CMA  Note - This record has been created using Animal nutritionist.  Chart creation errors have been sought, but may not always  have been located. Such creation errors do not reflect on  the standard of medical care.    [1]  Social History Tobacco Use   Smoking status: Never    Passive exposure: Never   Smokeless tobacco: Never  Vaping Use   Vaping status: Never Used  Substance Use Topics   Alcohol use: Not Currently   Drug use: Never   "

## 2024-04-21 ENCOUNTER — Ambulatory Visit: Admitting: Rheumatology

## 2024-04-21 DIAGNOSIS — Z96651 Presence of right artificial knee joint: Secondary | ICD-10-CM

## 2024-04-21 DIAGNOSIS — M19041 Primary osteoarthritis, right hand: Secondary | ICD-10-CM

## 2024-04-21 DIAGNOSIS — Z79899 Other long term (current) drug therapy: Secondary | ICD-10-CM

## 2024-04-21 DIAGNOSIS — G35D Multiple sclerosis, unspecified: Secondary | ICD-10-CM

## 2024-04-21 DIAGNOSIS — M1712 Unilateral primary osteoarthritis, left knee: Secondary | ICD-10-CM

## 2024-04-21 DIAGNOSIS — S129XXS Fracture of neck, unspecified, sequela: Secondary | ICD-10-CM

## 2024-04-21 DIAGNOSIS — Z8719 Personal history of other diseases of the digestive system: Secondary | ICD-10-CM

## 2024-04-21 DIAGNOSIS — Z96612 Presence of left artificial shoulder joint: Secondary | ICD-10-CM

## 2024-04-21 DIAGNOSIS — M19071 Primary osteoarthritis, right ankle and foot: Secondary | ICD-10-CM

## 2024-04-21 DIAGNOSIS — M0579 Rheumatoid arthritis with rheumatoid factor of multiple sites without organ or systems involvement: Secondary | ICD-10-CM

## 2024-04-21 DIAGNOSIS — M4004 Postural kyphosis, thoracic region: Secondary | ICD-10-CM

## 2024-04-21 DIAGNOSIS — E559 Vitamin D deficiency, unspecified: Secondary | ICD-10-CM

## 2024-04-21 DIAGNOSIS — M8589 Other specified disorders of bone density and structure, multiple sites: Secondary | ICD-10-CM

## 2024-04-21 DIAGNOSIS — Z96611 Presence of right artificial shoulder joint: Secondary | ICD-10-CM

## 2024-04-21 DIAGNOSIS — Z8669 Personal history of other diseases of the nervous system and sense organs: Secondary | ICD-10-CM
